# Patient Record
Sex: Female | Born: 1995 | Race: White | Hispanic: No | Marital: Single | Smoking: Former smoker
Health system: Southern US, Community
[De-identification: ages and names within clinical notes are randomized; demographics above are authoritative.]

## PROBLEM LIST (undated history)

## (undated) DIAGNOSIS — F3481 Disruptive mood dysregulation disorder: Secondary | ICD-10-CM

## (undated) DIAGNOSIS — R569 Unspecified convulsions: Secondary | ICD-10-CM

## (undated) DIAGNOSIS — K5900 Constipation, unspecified: Secondary | ICD-10-CM

## (undated) DIAGNOSIS — F603 Borderline personality disorder: Secondary | ICD-10-CM

## (undated) DIAGNOSIS — Z8719 Personal history of other diseases of the digestive system: Secondary | ICD-10-CM

## (undated) DIAGNOSIS — R45851 Suicidal ideations: Secondary | ICD-10-CM

## (undated) DIAGNOSIS — F329 Major depressive disorder, single episode, unspecified: Secondary | ICD-10-CM

## (undated) DIAGNOSIS — Z889 Allergy status to unspecified drugs, medicaments and biological substances status: Secondary | ICD-10-CM

## (undated) DIAGNOSIS — F4310 Post-traumatic stress disorder, unspecified: Secondary | ICD-10-CM

## (undated) DIAGNOSIS — F32A Depression, unspecified: Secondary | ICD-10-CM

## (undated) DIAGNOSIS — Z8669 Personal history of other diseases of the nervous system and sense organs: Secondary | ICD-10-CM

## (undated) DIAGNOSIS — F79 Unspecified intellectual disabilities: Secondary | ICD-10-CM

---
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
## 2017-02-01 ENCOUNTER — Encounter (HOSPITAL_COMMUNITY): Payer: Self-pay | Admitting: Family Medicine

## 2017-02-01 ENCOUNTER — Emergency Department (HOSPITAL_COMMUNITY)
Admission: EM | Admit: 2017-02-01 | Discharge: 2017-02-01 | Disposition: A | Discharge status: Home / Self Care | Payer: Medicaid Other | Attending: Emergency Medicine | Admitting: Emergency Medicine

## 2017-02-01 ENCOUNTER — Emergency Department (HOSPITAL_COMMUNITY): Payer: Medicaid Other

## 2017-02-01 DIAGNOSIS — R9089 Other abnormal findings on diagnostic imaging of central nervous system: Secondary | ICD-10-CM

## 2017-02-01 DIAGNOSIS — S060X0A Concussion without loss of consciousness, initial encounter: Secondary | ICD-10-CM | POA: Diagnosis not present

## 2017-02-01 DIAGNOSIS — Y999 Unspecified external cause status: Secondary | ICD-10-CM | POA: Diagnosis not present

## 2017-02-01 DIAGNOSIS — Y929 Unspecified place or not applicable: Secondary | ICD-10-CM | POA: Diagnosis not present

## 2017-02-01 DIAGNOSIS — S0990XA Unspecified injury of head, initial encounter: Secondary | ICD-10-CM | POA: Diagnosis present

## 2017-02-01 DIAGNOSIS — Y939 Activity, unspecified: Secondary | ICD-10-CM | POA: Insufficient documentation

## 2017-02-01 DIAGNOSIS — Z87891 Personal history of nicotine dependence: Secondary | ICD-10-CM | POA: Diagnosis not present

## 2017-02-01 DIAGNOSIS — Z79899 Other long term (current) drug therapy: Secondary | ICD-10-CM | POA: Insufficient documentation

## 2017-02-01 LAB — POC URINE PREG, ED: Preg Test, Ur: NEGATIVE
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
## 2017-02-01 NOTE — ED Notes (Signed)
Patient given sandwich, sprite, and cheese stick.

## 2017-02-01 NOTE — ED Notes (Signed)
Patient transported to CT 
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
## 2017-02-01 NOTE — ED Notes (Signed)
Spoke with Bard Herbertaphne, patient's group home director, who reports she will send transportation for patient.
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
## 2017-02-13 ENCOUNTER — Encounter (HOSPITAL_COMMUNITY): Payer: Self-pay | Admitting: Behavioral Health

## 2017-02-13 ENCOUNTER — Emergency Department (HOSPITAL_COMMUNITY)
Admission: EM | Admit: 2017-02-13 | Discharge: 2017-02-14 | Disposition: A | Discharge status: Home / Self Care | Payer: No Typology Code available for payment source | Attending: Emergency Medicine | Admitting: Emergency Medicine

## 2017-02-13 DIAGNOSIS — R456 Violent behavior: Secondary | ICD-10-CM | POA: Diagnosis not present

## 2017-02-13 DIAGNOSIS — R51 Headache: Secondary | ICD-10-CM | POA: Diagnosis not present

## 2017-02-13 DIAGNOSIS — F3481 Disruptive mood dysregulation disorder: Secondary | ICD-10-CM | POA: Diagnosis present

## 2017-02-13 DIAGNOSIS — F603 Borderline personality disorder: Secondary | ICD-10-CM | POA: Insufficient documentation

## 2017-02-13 DIAGNOSIS — Z79899 Other long term (current) drug therapy: Secondary | ICD-10-CM | POA: Insufficient documentation

## 2017-02-13 DIAGNOSIS — J3489 Other specified disorders of nose and nasal sinuses: Secondary | ICD-10-CM | POA: Diagnosis not present

## 2017-02-13 DIAGNOSIS — F4310 Post-traumatic stress disorder, unspecified: Secondary | ICD-10-CM | POA: Diagnosis not present

## 2017-02-13 DIAGNOSIS — R4689 Other symptoms and signs involving appearance and behavior: Secondary | ICD-10-CM | POA: Insufficient documentation

## 2017-02-13 DIAGNOSIS — R451 Restlessness and agitation: Secondary | ICD-10-CM | POA: Diagnosis not present

## 2017-02-13 DIAGNOSIS — F78 Other intellectual disabilities: Secondary | ICD-10-CM | POA: Insufficient documentation

## 2017-02-13 DIAGNOSIS — F329 Major depressive disorder, single episode, unspecified: Secondary | ICD-10-CM | POA: Insufficient documentation

## 2017-02-13 DIAGNOSIS — Z046 Encounter for general psychiatric examination, requested by authority: Secondary | ICD-10-CM | POA: Diagnosis present

## 2017-02-13 LAB — CBC WITH DIFFERENTIAL/PLATELET
Basophils Absolute: 0 10*3/uL (ref 0.0–0.1)
Basophils Relative: 0 %
EOS ABS: 0 10*3/uL (ref 0.0–0.7)
Eosinophils Relative: 0 %
HEMATOCRIT: 39 % (ref 36.0–46.0)
HEMOGLOBIN: 13 g/dL (ref 12.0–15.0)
LYMPHS ABS: 1.7 10*3/uL (ref 0.7–4.0)
LYMPHS PCT: 18 %
MCH: 29.3 pg (ref 26.0–34.0)
MCHC: 33.3 g/dL (ref 30.0–36.0)
MCV: 87.8 fL (ref 78.0–100.0)
MONOS PCT: 4 %
Monocytes Absolute: 0.4 10*3/uL (ref 0.1–1.0)
NEUTROS ABS: 7.4 10*3/uL (ref 1.7–7.7)
Neutrophils Relative %: 78 %
Platelets: 207 10*3/uL (ref 150–400)
RBC: 4.44 MIL/uL (ref 3.87–5.11)
RDW: 13 % (ref 11.5–15.5)
WBC: 9.4 10*3/uL (ref 4.0–10.5)

## 2017-02-13 LAB — COMPREHENSIVE METABOLIC PANEL
ALT: 13 U/L — AB (ref 14–54)
AST: 15 U/L (ref 15–41)
Albumin: 3.6 g/dL (ref 3.5–5.0)
Alkaline Phosphatase: 58 U/L (ref 38–126)
Anion gap: 5 (ref 5–15)
BILIRUBIN TOTAL: 0.3 mg/dL (ref 0.3–1.2)
BUN: 13 mg/dL (ref 6–20)
CHLORIDE: 112 mmol/L — AB (ref 101–111)
CO2: 20 mmol/L — ABNORMAL LOW (ref 22–32)
CREATININE: 0.9 mg/dL (ref 0.44–1.00)
Calcium: 9.7 mg/dL (ref 8.9–10.3)
Glucose, Bld: 109 mg/dL — ABNORMAL HIGH (ref 65–99)
Potassium: 3.6 mmol/L (ref 3.5–5.1)
Sodium: 137 mmol/L (ref 135–145)
TOTAL PROTEIN: 6.6 g/dL (ref 6.5–8.1)

## 2017-02-13 LAB — PREGNANCY, URINE: Preg Test, Ur: NEGATIVE

## 2017-02-13 LAB — RAPID URINE DRUG SCREEN, HOSP PERFORMED
AMPHETAMINES: NOT DETECTED
Barbiturates: NOT DETECTED
Benzodiazepines: NOT DETECTED
Cocaine: NOT DETECTED
OPIATES: NOT DETECTED
Tetrahydrocannabinol: NOT DETECTED

## 2017-02-13 LAB — ACETAMINOPHEN LEVEL: Acetaminophen (Tylenol), Serum: <10 ug/mL — ABNORMAL LOW (ref 10–30)

## 2017-02-13 LAB — ETHANOL

## 2017-02-13 LAB — SALICYLATE LEVEL: Salicylate Lvl: <7 mg/dL (ref 2.8–30.0)

## 2017-02-13 MED ORDER — OXCARBAZEPINE 150 MG PO TABS
75.0000 mg | ORAL_TABLET | Freq: Every day | ORAL | Status: DC
  Administered 2017-02-14: 75 mg via ORAL
  Filled 2017-02-13: qty 0.5

## 2017-02-13 MED ORDER — LITHIUM CARBONATE 300 MG PO CAPS
600.0000 mg | ORAL_CAPSULE | Freq: Two times a day (BID) | ORAL | Status: DC
  Administered 2017-02-14: 600 mg via ORAL
  Filled 2017-02-13: qty 2

## 2017-02-13 MED ORDER — SERTRALINE HCL 50 MG PO TABS
50.0000 mg | ORAL_TABLET | Freq: Every day | ORAL | Status: DC
  Administered 2017-02-13 – 2017-02-14 (×2): 50 mg via ORAL
  Filled 2017-02-13 (×2): qty 1

## 2017-02-13 MED ORDER — PANTOPRAZOLE SODIUM 40 MG PO TBEC
40.0000 mg | DELAYED_RELEASE_TABLET | Freq: Every day | ORAL | Status: DC
  Administered 2017-02-13 – 2017-02-14 (×2): 40 mg via ORAL
  Filled 2017-02-13 (×2): qty 1

## 2017-02-13 MED ORDER — VITAMIN D 1000 UNITS PO TABS
1000.0000 [IU] | ORAL_TABLET | Freq: Every day | ORAL | Status: DC
  Administered 2017-02-14: 1000 [IU] via ORAL
  Filled 2017-02-13: qty 1

## 2017-02-13 MED ORDER — LEVETIRACETAM 500 MG PO TABS
1500.0000 mg | ORAL_TABLET | Freq: Two times a day (BID) | ORAL | Status: DC
  Administered 2017-02-13 – 2017-02-14 (×2): 1500 mg via ORAL
  Filled 2017-02-13 (×2): qty 3

## 2017-02-13 MED ORDER — ACETAMINOPHEN 325 MG PO TABS: 650.0000 mg | ORAL_TABLET | ORAL | Status: DC | PRN

## 2017-02-13 MED ORDER — VITAMIN B-6 50 MG PO TABS
50.0000 mg | ORAL_TABLET | Freq: Every day | ORAL | Status: DC
  Administered 2017-02-14: 50 mg via ORAL
  Filled 2017-02-13: qty 1

## 2017-02-13 MED ORDER — GUANFACINE HCL 1 MG PO TABS
0.5000 mg | ORAL_TABLET | Freq: Two times a day (BID) | ORAL | Status: DC
  Administered 2017-02-14: 0.5 mg via ORAL
  Filled 2017-02-13 (×2): qty 1

## 2017-02-13 MED ORDER — LORATADINE 10 MG PO TABS
10.0000 mg | ORAL_TABLET | Freq: Every day | ORAL | Status: DC
  Administered 2017-02-14: 10 mg via ORAL
  Filled 2017-02-13: qty 1

## 2017-02-13 MED ORDER — DOCUSATE SODIUM 100 MG PO CAPS
100.0000 mg | ORAL_CAPSULE | Freq: Two times a day (BID) | ORAL | Status: DC
  Administered 2017-02-13 – 2017-02-14 (×2): 100 mg via ORAL
  Filled 2017-02-13 (×2): qty 1

## 2017-02-13 MED ORDER — TRAZODONE HCL 50 MG PO TABS
150.0000 mg | ORAL_TABLET | Freq: Every evening | ORAL | Status: DC | PRN
  Administered 2017-02-13: 23:00:00 150 mg via ORAL
  Filled 2017-02-13: qty 1

## 2017-02-13 NOTE — ED Notes (Signed)
Bed: WA29 Expected date:  Expected time:  Means of arrival:  Comments: [facility]
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
## 2017-02-13 NOTE — ED Notes (Signed)
Pt A&O x 3, no distress noted, calm & cooperative, watching TV at present.  Monitoring for safety, Q 15 min checks in effect.  Pt assessed by TTS.
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
## 2017-02-13 NOTE — ED Notes (Signed)
Pt oriented to room and unit.  Pt is somewhat irritable but cooperative.  She doesn't want to be here but understands that she is under IVC.  Pt denies S/I, H/I, and AVH.  Pt was given Dinner and extra blanket and instructed to let us know of her needs.  15 minute checks and video monitoring in place.
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
## 2017-02-14 DIAGNOSIS — F3481 Disruptive mood dysregulation disorder: Secondary | ICD-10-CM

## 2017-02-14 LAB — LITHIUM LEVEL: Lithium Lvl: 0.82 mmol/L (ref 0.60–1.20)

## 2017-02-14 NOTE — ED Notes (Addendum)
Pt d/c home per MD order. Discharge summary reviewed with pt. Pt verbalizes understanding. Denies SI/HI/AVH. Pt signed e-signature. Personal property returned. Pt ambulatory off unit. Group home member in lobby for d/c..Marland Kitchen

## 2017-02-14 NOTE — Consult Note (Signed)
[facility] Consult   Reason for Consult: Agitation  Referring Physician:  EDP Patient Identification: Sherry Bryan MRN:  [id number] Principal Diagnosis: DMDD (disruptive mood dysregulation disorder) ([place]) Diagnosis:   Patient Active Problem List   Diagnosis Date Noted  . DMDD (disruptive mood dysregulation disorder) ([place]) [F34.81] 05/15/2015    Priority: High  . Abnormal CT of brain [R90.89] 02/01/2017  . Borderline personality disorder ([place]) [F60.3]   . Lithium toxicity [T56.891A] 12/19/2016  . Suicidal ideation [R45.851] 12/19/2016  . Homicidal ideations [R45.850]   . Suicidal ideations [R45.851]   . MDD (major depressive disorder), recurrent severe, without psychosis ([place]) [F33.2] 09/23/2015  . Adjustment disorder with mixed disturbance of emotions and conduct [F43.25] 09/23/2015  . Intellectual disability [F79] 05/22/2015  . MDD (major depressive disorder), recurrent, severe, with psychosis ([place]) [F33.3] 05/15/2015  . Hx of seizure disorder [Z86.69] 05/15/2015  . Constipation [K59.00] 05/15/2015  . Hx of gastroesophageal reflux (GERD) [Z87.19] 05/15/2015  . History of seasonal allergies [Z88.9] 05/15/2015    Total Time spent with patient: 45 minutes  Subjective:   Sherry Bryan is a 22 y.o. female patient admitted with agitation.  HPI:  Pt was seen and chart reviewed with treatment team and Dr Mariea Clonts. Pt stated she was at her group home and got upset because she could not afford some shoes she wanted and no one would give her the extra 10 dollars. When she got upset they IVC'd her and brought her to the hospital.  Pt denies suicidal/homicidal ideation, denies auditory/visual hallucinations and does not appear to be responding to internal stimuli. Pt's UDS and BAL are negative, Lithium level is 0.82, therapeutic. Pt is psychiatrically clear for discharge back to her group home.   Past Psychiatric History: As above  Risk to Self: None Risk to Others:  None Prior Inpatient Therapy: Prior Inpatient Therapy: Yes Prior Therapy Dates: (multiple) Prior Therapy Facilty/Provider(s): (Last hospitalized at [facility] last year) Reason for Treatment: (depression) Prior Outpatient Therapy: Prior Outpatient Therapy: Yes Prior Therapy Dates: (current) Prior Therapy Facilty/Provider(s): Consulting civil engineer) Reason for Treatment: (depression) Does patient have an ACCT team?: No Does patient have Intensive In-House Services?  : No Does patient have [facility] services? : No Does patient have P4CC services?: No  Past Medical History:  Past Medical History:  Diagnosis Date  . Borderline personality disorder ([place])   . Constipation 05/15/2015  . Depression   . DMDD (disruptive mood dysregulation disorder) ([place]) 05/15/2015  . History of seasonal allergies 05/15/2015  . Hx of gastroesophageal reflux (GERD) 05/15/2015  . Hx of seizure disorder 05/15/2015  . Intellectual disability 05/22/2015  . PTSD (post-traumatic stress disorder)   . Seizures ([place])    last one in 2015  . Suicidal ideation    History reviewed. No pertinent surgical history. Family History: History reviewed. No pertinent family history. Family Psychiatric  History: Unknown Social History:  Social History   Substance and Sexual Activity  Alcohol Use No     Social History   Substance and Sexual Activity  Drug Use No    Social History   Socioeconomic History  . Marital status: Single    Spouse name: None  . Number of children: None  . Years of education: None  . Highest education level: None  Social Needs  . Financial resource strain: None  . Food insecurity - worry: None  . Food insecurity - inability: None  . Transportation needs - medical: None  . Transportation needs - non-medical: None  Occupational History  .  None  Tobacco Use  . Smoking status: Former Research scientist (life sciences)  . Smokeless tobacco: Never Used  Substance and Sexual Activity  . Alcohol use: No  . Drug use: No  . Sexual activity: No   Other Topics Concern  . None  Social History Narrative  . None   Additional Social History: N/A    Allergies:   Allergies  Allergen Reactions  . Ritalin [Methylphenidate Hcl] Other (See Comments)    seizures  . Abilify [Aripiprazole] Other (See Comments)    Shaking or tremors    Labs:  Results for orders placed or performed during the hospital encounter of 02/13/17 (from the past 48 hour(s))  Comprehensive metabolic panel     Status: Abnormal   Collection Time: 02/13/17  5:13 PM  Result Value Ref Range   Sodium 137 135 - 145 mmol/L   Potassium 3.6 3.5 - 5.1 mmol/L   Chloride 112 (H) 101 - 111 mmol/L   CO2 20 (L) 22 - 32 mmol/L   Glucose, Bld 109 (H) 65 - 99 mg/dL   BUN 13 6 - 20 mg/dL   Creatinine, Ser 0.90 0.44 - 1.00 mg/dL   Calcium 9.7 8.9 - 10.3 mg/dL   Total Protein 6.6 6.5 - 8.1 g/dL   Albumin 3.6 3.5 - 5.0 g/dL   AST 15 15 - 41 U/L   ALT 13 (L) 14 - 54 U/L   Alkaline Phosphatase 58 38 - 126 U/L   Total Bilirubin 0.3 0.3 - 1.2 mg/dL   GFR calc non Af Amer >60 >60 mL/min   GFR calc Af Amer >60 >60 mL/min    Comment: (NOTE) The eGFR has been calculated using the CKD EPI equation. This calculation has not been validated in all clinical situations. eGFR's persistently <60 mL/min signify possible Chronic Kidney Disease.    Anion gap 5 5 - 15    Comment: Performed at [facility], [place] [address], [place], [place] [address]  Ethanol     Status: None   Collection Time: 02/13/17  5:13 PM  Result Value Ref Range   Alcohol, Ethyl (B) <10 <10 mg/dL    Comment:        LOWEST DETECTABLE LIMIT FOR SERUM ALCOHOL IS 10 mg/dL FOR MEDICAL PURPOSES ONLY Performed at [place] [address], [place], [place] [address]   CBC with Diff     Status: None   Collection Time: 02/13/17  5:13 PM  Result Value Ref Range   WBC 9.4 4.0 - 10.5 K/uL   RBC 4.44 3.87 - 5.11 MIL/uL   Hemoglobin 13.0 12.0 - 15.0 g/dL   HCT 39.0 36.0 - 46.0 %    MCV 87.8 78.0 - 100.0 fL   MCH 29.3 26.0 - 34.0 pg   MCHC 33.3 30.0 - 36.0 g/dL   RDW 13.0 11.5 - 15.5 %   Platelets 207 150 - 400 K/uL   Neutrophils Relative % 78 %   Neutro Abs 7.4 1.7 - 7.7 K/uL   Lymphocytes Relative 18 %   Lymphs Abs 1.7 0.7 - 4.0 K/uL   Monocytes Relative 4 %   Monocytes Absolute 0.4 0.1 - 1.0 K/uL   Eosinophils Relative 0 %   Eosinophils Absolute 0.0 0.0 - 0.7 K/uL   Basophils Relative 0 %   Basophils Absolute 0.0 0.0 - 0.1 K/uL    Comment: Performed at [facility], [place] [address] [place], [place] [address]  Salicylate level  Status: None   Collection Time: 02/13/17  5:13 PM  Result Value Ref Range   Salicylate Lvl <3.8 2.8 - 30.0 mg/dL    Comment: Performed at [facility], [place] [address] [place], [place] [address]  Acetaminophen level     Status: Abnormal   Collection Time: 02/13/17  5:13 PM  Result Value Ref Range   Acetaminophen (Tylenol), Serum <10 (L) 10 - 30 ug/mL    Comment:        THERAPEUTIC CONCENTRATIONS VARY SIGNIFICANTLY. A RANGE OF 10-30 ug/mL MAY BE AN EFFECTIVE CONCENTRATION FOR MANY PATIENTS. HOWEVER, SOME ARE BEST TREATED AT CONCENTRATIONS OUTSIDE THIS RANGE. ACETAMINOPHEN CONCENTRATIONS >150 ug/mL AT 4 HOURS AFTER INGESTION AND >50 ug/mL AT 12 HOURS AFTER INGESTION ARE OFTEN ASSOCIATED WITH TOXIC REACTIONS. Performed at [facility], [place] [address] [place], [place] [address]   Urine rapid drug screen (hosp performed)     Status: None   Collection Time: 02/13/17  5:24 PM  Result Value Ref Range   Opiates NONE DETECTED NONE DETECTED   Cocaine NONE DETECTED NONE DETECTED   Benzodiazepines NONE DETECTED NONE DETECTED   Amphetamines NONE DETECTED NONE DETECTED   Tetrahydrocannabinol NONE DETECTED NONE DETECTED   Barbiturates NONE DETECTED NONE DETECTED    Comment: (NOTE) DRUG SCREEN FOR MEDICAL PURPOSES ONLY.  IF CONFIRMATION IS NEEDED FOR ANY PURPOSE, NOTIFY  LAB WITHIN 5 DAYS. LOWEST DETECTABLE LIMITS FOR URINE DRUG SCREEN Drug Class                     Cutoff (ng/mL) Amphetamine and metabolites    1000 Barbiturate and metabolites    200 Benzodiazepine                 527 Tricyclics and metabolites     300 Opiates and metabolites        300 Cocaine and metabolites        300 THC                            50 Performed at [facility], [place] [address], [place], [place] [address]   Pregnancy, urine     Status: None   Collection Time: 02/13/17  5:24 PM  Result Value Ref Range   Preg Test, Ur NEGATIVE NEGATIVE    Comment:        THE SENSITIVITY OF THIS METHODOLOGY IS >20 mIU/mL. Performed at [facility], [place] [address], [place], [place] [address]   Lithium level     Status: None   Collection Time: 02/14/17  9:55 AM  Result Value Ref Range   Lithium Lvl 0.82 0.60 - 1.20 mmol/L    Comment: Performed at [facility], [place] [address], [place],  [address]    Current Facility-Administered Medications  Medication Dose Route Frequency Provider Last Rate Last Dose  . acetaminophen (TYLENOL) tablet 650 mg  650 mg Oral Q4H PRN Jacqlyn Larsen, PA-C      . cholecalciferol (VITAMIN D) tablet 1,000 Units  1,000 Units Oral Daily Jacqlyn Larsen, PA-C   1,000 Units at 02/14/17 1017  . docusate sodium (COLACE) capsule 100 mg  100 mg Oral BID Benedetto Goad N, PA-C   100 mg at 02/14/17 1016  . guanFACINE (TENEX) tablet 0.5 mg  0.5 mg Oral BID Benedetto Goad N, PA-C   0.5 mg at 02/14/17 1017  . levETIRAcetam (KEPPRA)  tablet 1,500 mg  1,500 mg Oral BID Benedetto Goad N, PA-C   1,500 mg at 02/14/17 1016  . lithium carbonate capsule 600 mg  600 mg Oral BID WC Benedetto Goad N, PA-C   600 mg at 02/14/17 0840  . loratadine (CLARITIN) tablet 10 mg  10 mg Oral Daily Benedetto Goad N, PA-C   10 mg at 02/14/17 1016  . OXcarbazepine (TRILEPTAL) tablet 75 mg  75 mg Oral Daily Jacqlyn Larsen, PA-C   75 mg at  02/14/17 1017  . pantoprazole (PROTONIX) EC tablet 40 mg  40 mg Oral Daily Benedetto Goad N, PA-C   40 mg at 02/14/17 1016  . pyridOXINE (VITAMIN B-6) tablet 50 mg  50 mg Oral Daily Benedetto Goad N, PA-C   50 mg at 02/14/17 1016  . sertraline (ZOLOFT) tablet 50 mg  50 mg Oral Daily Benedetto Goad N, PA-C   50 mg at 02/14/17 1016  . traZODone (DESYREL) tablet 150 mg  150 mg Oral QHS PRN,MR X 1 Laverle Hobby, PA-C   150 mg at 02/13/17 2242   Current Outpatient Medications  Medication Sig Dispense Refill  . cholecalciferol (VITAMIN D) 1000 units tablet Take 1,000 Units by mouth daily.    Marland Kitchen docusate sodium (COLACE) 100 MG capsule Take 100 mg by mouth 2 (two) times daily.     Marland Kitchen guaiFENesin (MUCINEX) 600 MG 12 hr tablet Take 600 mg by mouth 2 (two) times daily.    Marland Kitchen guanFACINE (TENEX) 1 MG tablet Take 0.5 mg by mouth 2 (two) times daily.     Marland Kitchen levETIRAcetam (KEPPRA) 750 MG tablet Take 1,500 mg by mouth 2 (two) times daily.    Marland Kitchen lithium carbonate 300 MG capsule Take 600 mg by mouth 2 (two) times daily with a meal.    . loratadine (CLARITIN) 10 MG tablet Take 10 mg by mouth daily.    Marland Kitchen omeprazole (PRILOSEC) 20 MG capsule Take 40 mg by mouth daily.     . OXcarbazepine (TRILEPTAL) 150 MG tablet Take 75 mg by mouth daily.    Marland Kitchen pyridOXINE (VITAMIN B-6) 50 MG tablet Take 50 mg by mouth daily.    . sertraline (ZOLOFT) 50 MG tablet Take 50 mg by mouth daily.     Marland Kitchen adapalene (DIFFERIN) 0.1 % cream Apply 1 application topically at bedtime. Has not started yet.    . Ascorbic Acid (VITAMIN C PO) Take 1 tablet by mouth daily.    . Cyanocobalamin (VITAMIN B 12 PO) Take 1 tablet by mouth daily.    . fluticasone (FLONASE) 50 MCG/ACT nasal spray Place 2 sprays into the nose daily as needed for allergies.    Marland Kitchen lithium 600 MG capsule Take 600 mg by mouth 2 (two) times daily with a meal.    . meclizine (ANTIVERT) 25 MG tablet Take 25 mg by mouth 3 (three) times daily as needed for nausea.    . medroxyPROGESTERone  (DEPO-PROVERA) 150 MG/ML injection Inject 150 mg into the muscle every 3 (three) months.    . Multiple Vitamins-Minerals (MULTIVITAMIN ADULTS) TABS Take 1 tablet by mouth daily.    Marland Kitchen nystatin (MYCOSTATIN/NYSTOP) powder Apply 1 Bottle topically at bedtime as needed (RASH).    Marland Kitchen polyethylene glycol powder (GLYCOLAX/MIRALAX) powder Take 17 g by mouth daily as needed for constipation.    Marland Kitchen pyridOXINE (B-6) 50 MG tablet Take 50 mg by mouth daily.    Marland Kitchen topiramate (TOPAMAX) 25 MG tablet Take 25 mg by mouth 2 (two)  times daily.    . traZODone (DESYREL) 100 MG tablet Take 300 mg by mouth at bedtime.    . triamcinolone cream (KENALOG) 0.1 % Apply 1 application topically 2 (two) times daily.       Musculoskeletal: Strength & Muscle Tone: within normal limits Gait & Station: normal Patient leans: N/A  Psychiatric Specialty Exam: Physical Exam  Nursing note and vitals reviewed. Constitutional: She is oriented to person, place, and time. She appears well-developed and well-nourished.  Neck: Normal range of motion.  Respiratory: Effort normal.  Musculoskeletal: Normal range of motion.  Neurological: She is alert and oriented to person, place, and time.  Psychiatric: Her speech is normal and behavior is normal. Thought content normal. Cognition and memory are normal. She expresses impulsivity. She exhibits a depressed mood.    Review of Systems  Psychiatric/Behavioral: Positive for depression. Negative for hallucinations, memory loss, substance abuse and suicidal ideas. The patient is not nervous/anxious and does not have insomnia.   All other systems reviewed and are negative.   Blood pressure (!) 105/57, pulse 85, temperature 98.2 F (36.8 C), temperature source Oral, resp. rate 16, SpO2 99 %.There is no height or weight on file to calculate BMI.  General Appearance: Casual  Eye Contact:  Good  Speech:  Clear and Coherent and Normal Rate  Volume:  Normal  Mood:  Depressed  Affect:  Congruent  and Depressed  Thought Process:  Coherent, Goal Directed and Linear  Orientation:  Full (Time, Place, and Person)  Thought Content:  Logical  Suicidal Thoughts:  No  Homicidal Thoughts:  No  Memory:  Immediate;   Good Recent;   Good Remote;   Fair  Judgement:  Poor  Insight:  Lacking  Psychomotor Activity:  Normal  Concentration:  Concentration: Good and Attention Span: Good  Recall:  Good  Fund of Knowledge:  Good  Language:  Good  Akathisia:  No  Handed:  Right  AIMS (if indicated):    N/A  Assets:  Agricultural consultant Housing  ADL's:  Intact  Cognition:  WNL  Sleep:    N/A     Treatment Plan Summary: Plan DMDD (disruptive mood dysregulation disorder) ([place])  Discharge Home Take all medications as prescribed  Disposition: No evidence of imminent risk to self or others at present.   Patient does not meet criteria for psychiatric inpatient admission. Supportive therapy provided about ongoing stressors.  Ethelene Hal, NP 02/14/2017 12:08 PM   Patient seen face-to-face for psychiatric evaluation, chart reviewed and case discussed with the physician extender and developed treatment plan. Reviewed the information documented and agree with the treatment plan.  Buford Dresser, DO 02/14/17 10:45 PM

## 2017-02-14 NOTE — ED Notes (Signed)
phlebotomist called for ordered lithium level.

## 2017-02-14 NOTE — BHH Suicide Risk Assessment (Signed)
Suicide Risk Assessment  Discharge Assessment   [facility]   Principal Problem: DMDD (disruptive mood dysregulation disorder) [facility](HCC) Discharge Diagnoses:  Patient Active Problem List   Diagnosis Date Noted  . DMDD (disruptive mood dysregulation disorder) (HCC) [F34.81] 05/15/2015    Priority: High  . Abnormal CT of brain [R90.89] 02/01/2017  . Borderline personality disorder (HCC) [F60.3]   . Lithium toxicity [T56.891A] 12/19/2016  . Suicidal ideation [R45.851] 12/19/2016  . Homicidal ideations [R45.850]   . Suicidal ideations [R45.851]   . MDD (major depressive disorder), recurrent severe, without psychosis (HCC) [F33.2] 09/23/2015  . Adjustment disorder with mixed disturbance of emotions and conduct [F43.25] 09/23/2015  . Intellectual disability [F79] 05/22/2015  . MDD (major depressive disorder), recurrent, severe, with psychosis (HCC) [F33.3] 05/15/2015  . Hx of seizure disorder [Z86.69] 05/15/2015  . Constipation [K59.00] 05/15/2015  . Hx of gastroesophageal reflux (GERD) [Z87.19] 05/15/2015  . History of seasonal allergies [Z88.9] 05/15/2015    Total Time spent with patient: 45 minutes  Musculoskeletal: Strength & Muscle Tone: within normal limits Gait & Station: normal Patient leans: N/A   Psychiatric Specialty Exam: Physical Exam  Constitutional: She is oriented to person, place, and time. She appears well-developed and well-nourished.  Respiratory: Effort normal.  Musculoskeletal: Normal range of motion.  Neurological: She is alert and oriented to person, place, and time.  Psychiatric: Her speech is normal and behavior is normal. Thought content normal. Cognition and memory are normal. She expresses impulsivity. She exhibits a depressed mood.   Review of Systems  Psychiatric/Behavioral: Positive for depression. Negative for hallucinations, memory loss, substance abuse and suicidal ideas. The patient is not nervous/anxious and does not have  insomnia.   All other systems reviewed and are negative.  Blood pressure (!) 105/57, pulse 85, temperature 98.2 F (36.8 C), temperature source Oral, resp. rate 16, SpO2 99 %.There is no height or weight on file to calculate BMI. General Appearance: Casual Eye Contact:  Good Speech:  Clear and Coherent and Normal Rate Volume:  Normal Mood:  Depressed Affect:  Congruent and Depressed Thought Process:  Coherent, Goal Directed and Linear Orientation:  Full (Time, Place, and Person) Thought Content:  Logical Suicidal Thoughts:  No Homicidal Thoughts:  No Memory:  Immediate;   Good Recent;   Good Remote;   Fair Judgement:  Poor Insight:  Lacking Psychomotor Activity:  Normal Concentration:  Concentration: Good and Attention Span: Good Recall:  Good Fund of Knowledge:  Good Language:  Good Akathisia:  No Handed:  Right AIMS (if indicated):    Assets:  ArchitectCommunication Skills Financial Resources/Insurance Housing ADL's:  Intact Cognition:  WNL   Mental Status Per Nursing Assessment::   On Admission:   agitation  Demographic Factors:  Adolescent or young adult and Caucasian  Loss Factors: NA  Historical Factors: Impulsivity  Risk Reduction Factors:   Sense of responsibility to family and Living with another person, especially a relative  Continued Clinical Symptoms:  Depression:   Impulsivity Personality Disorders:   Cluster B Previous Psychiatric Diagnoses and Treatments  Cognitive Features That Contribute To Risk:  Closed-mindedness    Suicide Risk:  Minimal: No identifiable suicidal ideation.  Patients presenting with no risk factors but with morbid ruminations; may be classified as minimal risk based on the severity of the depressive symptoms    Plan Of Care/Follow-up recommendations:  Activity:  as tolerated Diet:  Heart healthy  Laveda AbbeLaurie Britton Parks, NP 02/14/2017, 12:10 PM

## 2017-02-14 NOTE — Progress Notes (Addendum)
11:03am- Ms. Rush informed that pt would be going back to group home today. No further questions. RN informed that pt will be being discharged today.   CSW received call from Tom (Dispostion) and was informed that pt is being cleared by psych and that pt would need to be picked up today. CSW spoke with [name] from pt's group home and confirmed that she would have someone to pick pt up around 11:30am-12pm. CSW reached out to Truddie [facility]Alexandra [facility]. CSW left Ms. Rush a VM at this time. There are no further CSW interventions needed at this time. CSW signing off.     Claude MangesKierra S. Miron Marxen, MSW, LCSW-A Emergency Department Clinical Social Worker [phone]

## 2017-02-14 NOTE — BH Assessment (Addendum)
[facility]  Per Juanetta BeetsJacqueline Norman, DO, this pt does not require psychiatric hospitalization at this time.  Pt presents under IVC initiated by staff at her group home, which Dr Sharma CovertNorman has rescinded.  Pt is to return to group home.  Norberta KeensKiarra, LCSW agrees to facilitate this.  At 11:00 Truddie CocoAlexandra Rush [phone]), pt's [facility] guardian, calls to inquire about disposition, and this Clinical research associatewriter informed her.  She asks to be called when pt is discharged, which I agree to do.  Pt's nurse, Morrie Sheldonshley, has been notified.  Doylene Canninghomas Jodeci Roarty, MA Triage Specialist [phone]   Addendum:  At 13:44 this writer called Ms Lamar Benesush.  Call rolled to voice mail, and I left a message notifying her [facility].  Doylene Canninghomas Muzammil Bruins, [place] Behavioral Health Coordinator [phone]

## 2019-11-14 IMAGING — CR DG HIP (WITH OR WITHOUT PELVIS) 2-3V*R*
3 series · 3 of 3 positions shown · non-contrast
Comparison: None.

CLINICAL DATA: Fall with right hip pain.  Initial encounter.

EXAM:
DG HIP (WITH OR WITHOUT PELVIS) 2-3V RIGHT

[t pelvis ap]
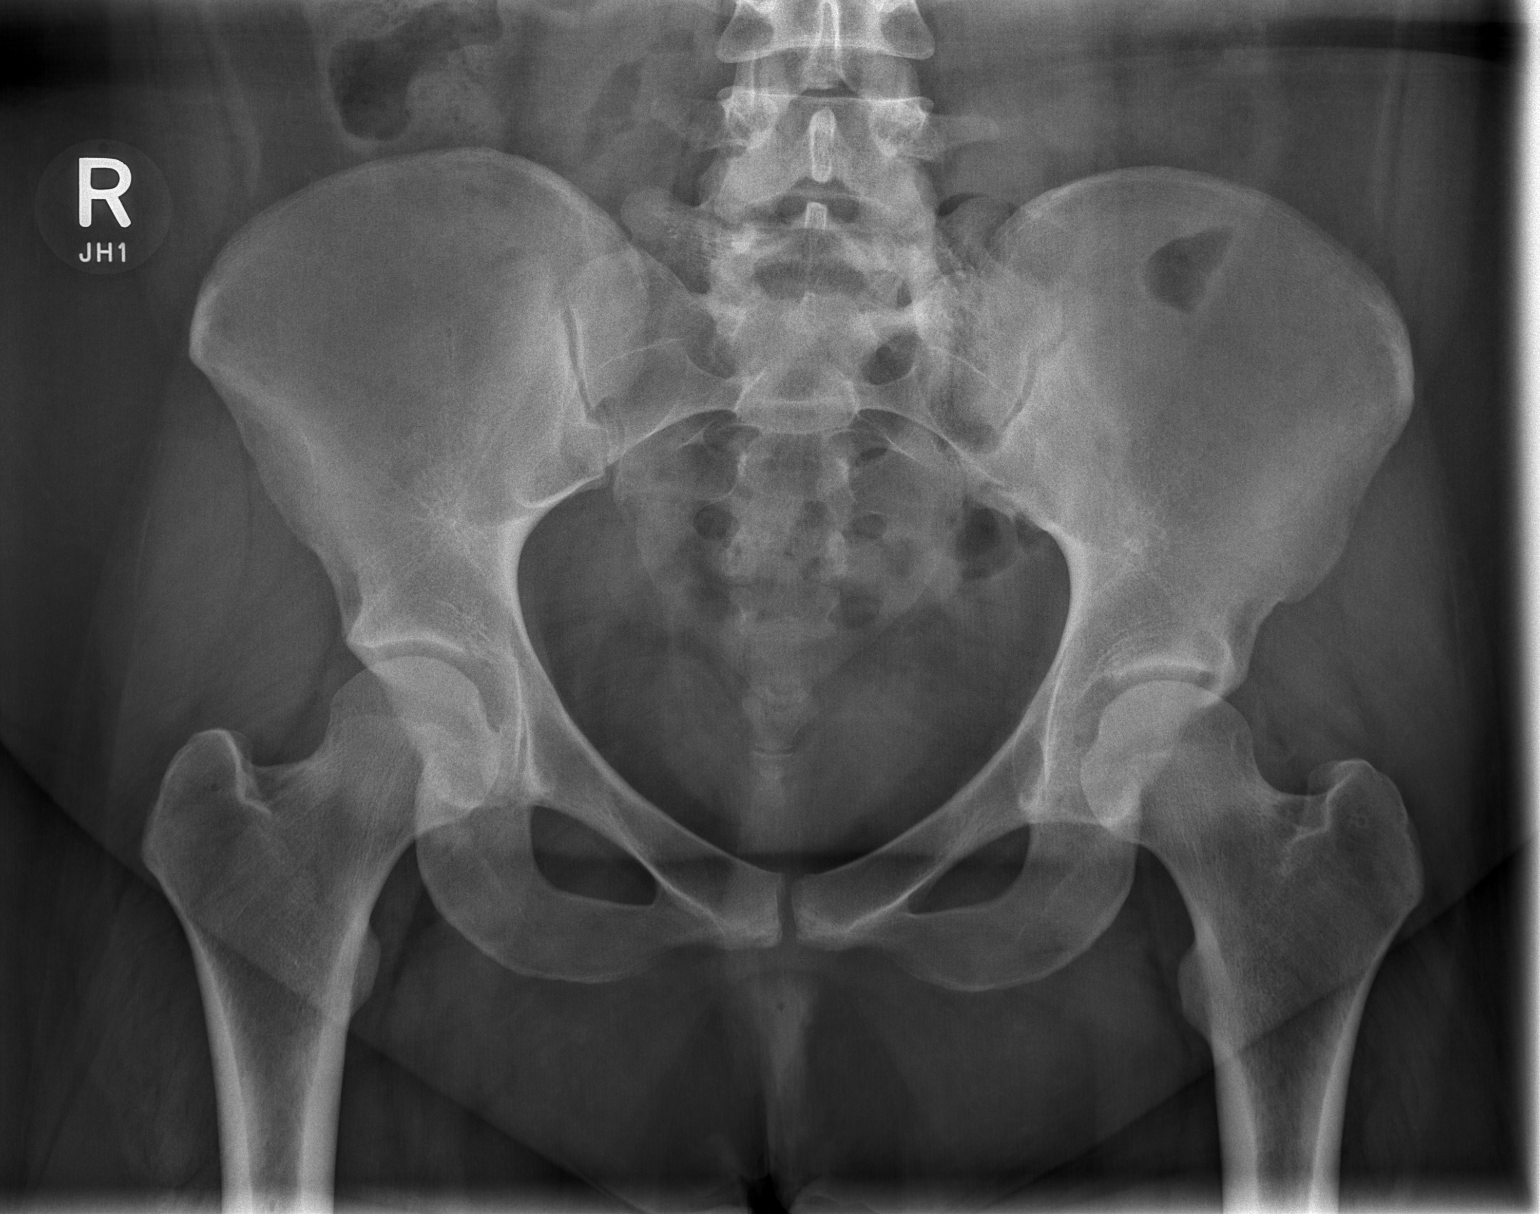

[t hip ap right]
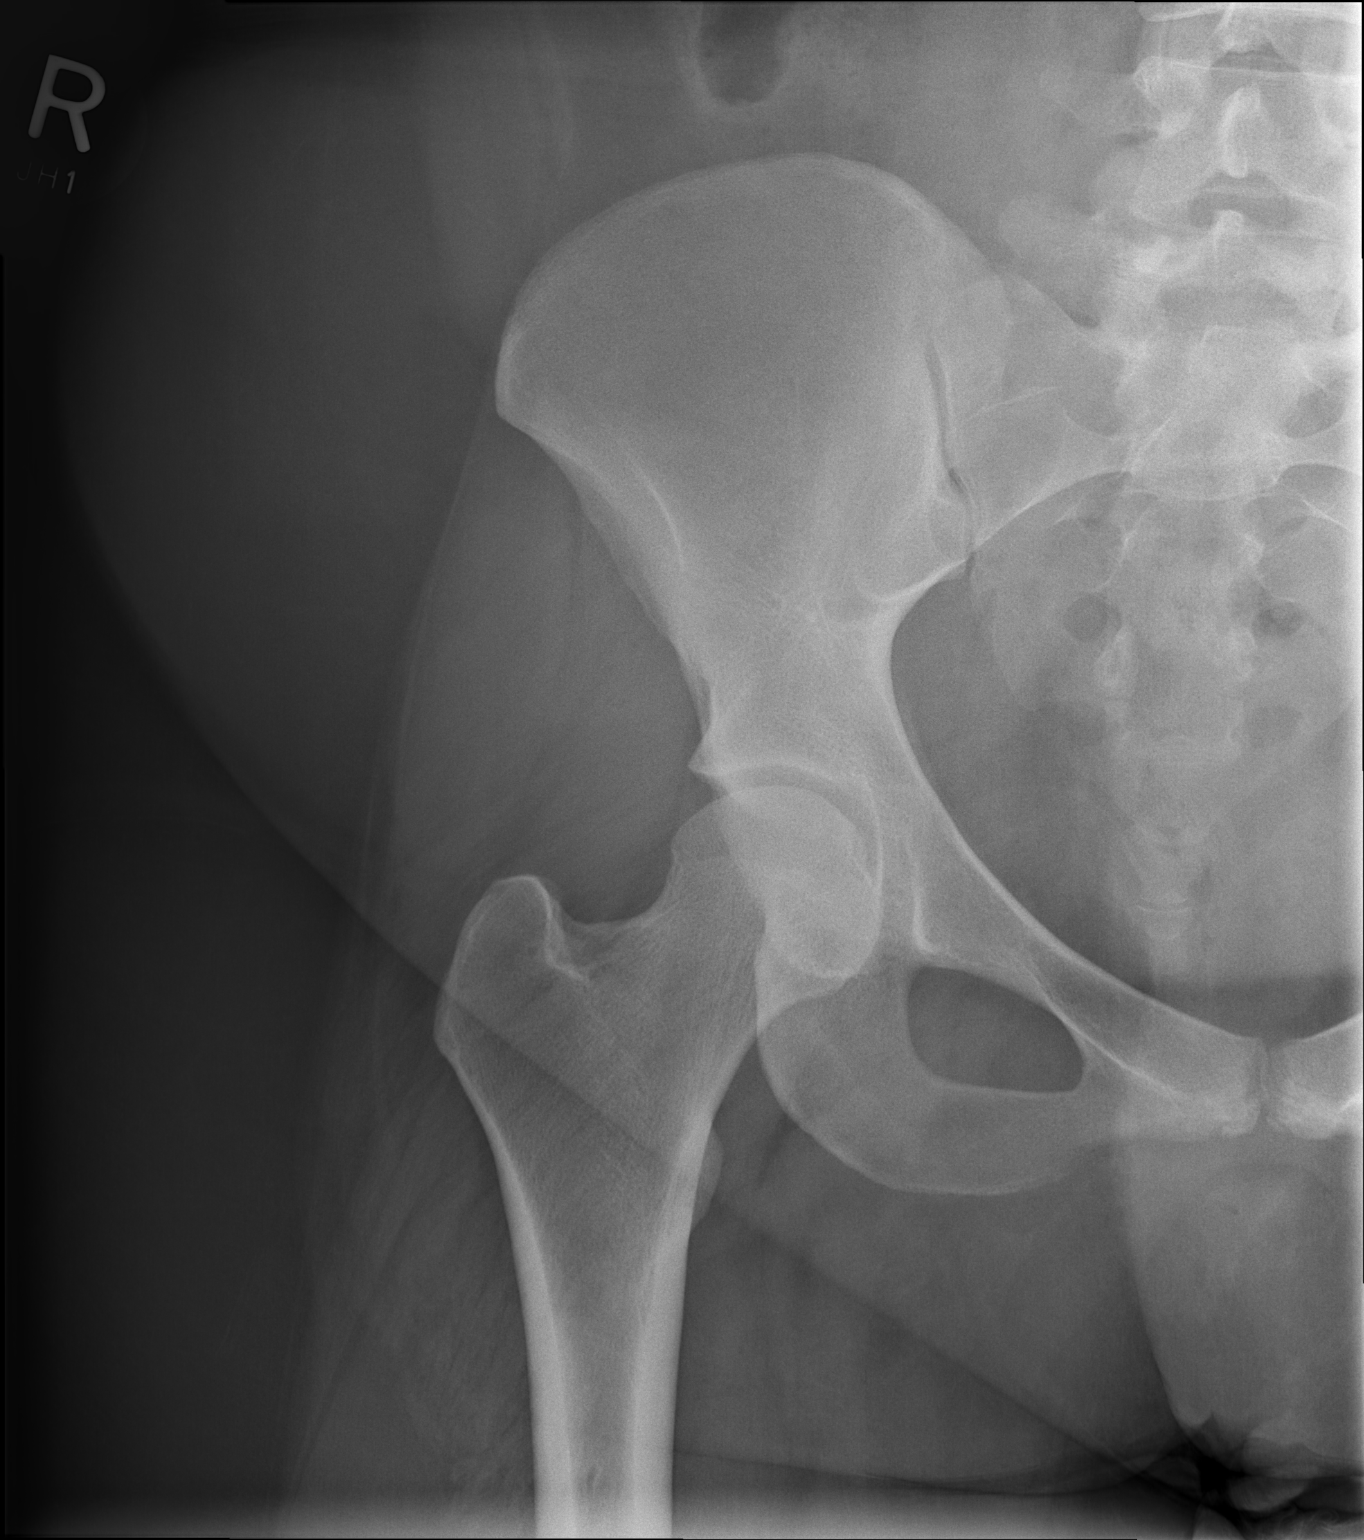

[t hip frog leg right]
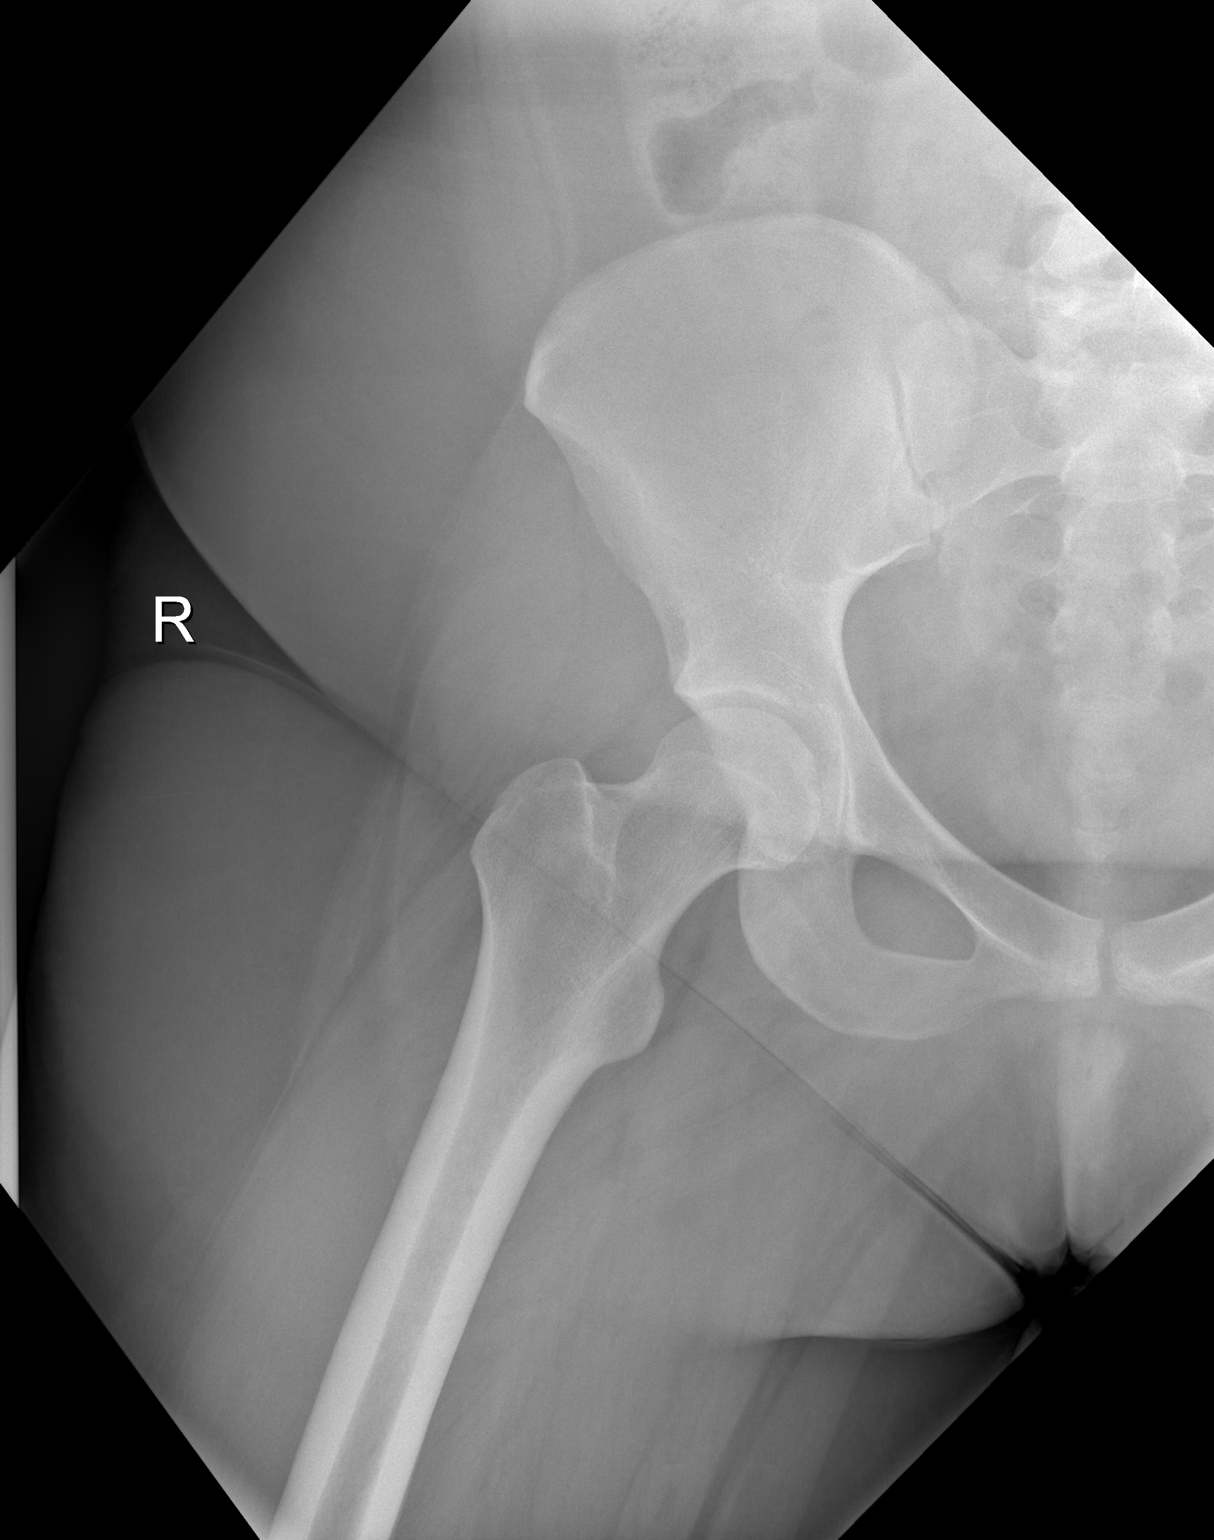

[3 of 3 positions shown; findings below may reference images not displayed]

FINDINGS: There is no evidence of hip fracture or dislocation. There is no
evidence of arthropathy or other focal bone abnormality. The bony
pelvis is intact without evidence of fracture or diastasis.
IMPRESSION: Negative.

## 2019-11-14 IMAGING — CR DG LUMBAR SPINE COMPLETE 4+V
5 series · 5 of 5 positions shown · non-contrast
Comparison: None

CLINICAL DATA: Fell today and snow and injured back.

EXAM:
LUMBAR SPINE - COMPLETE 4+ VIEW

[t lumbar spine ap]
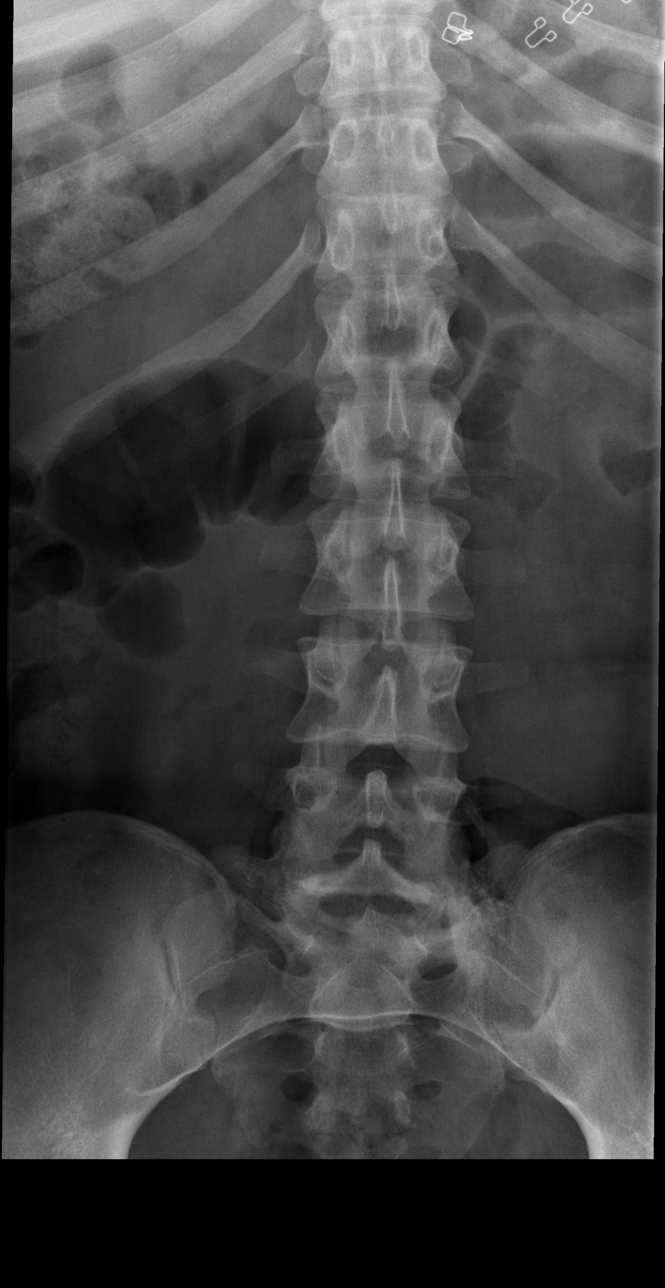

[t lumbar spine obl (1 of 2)]
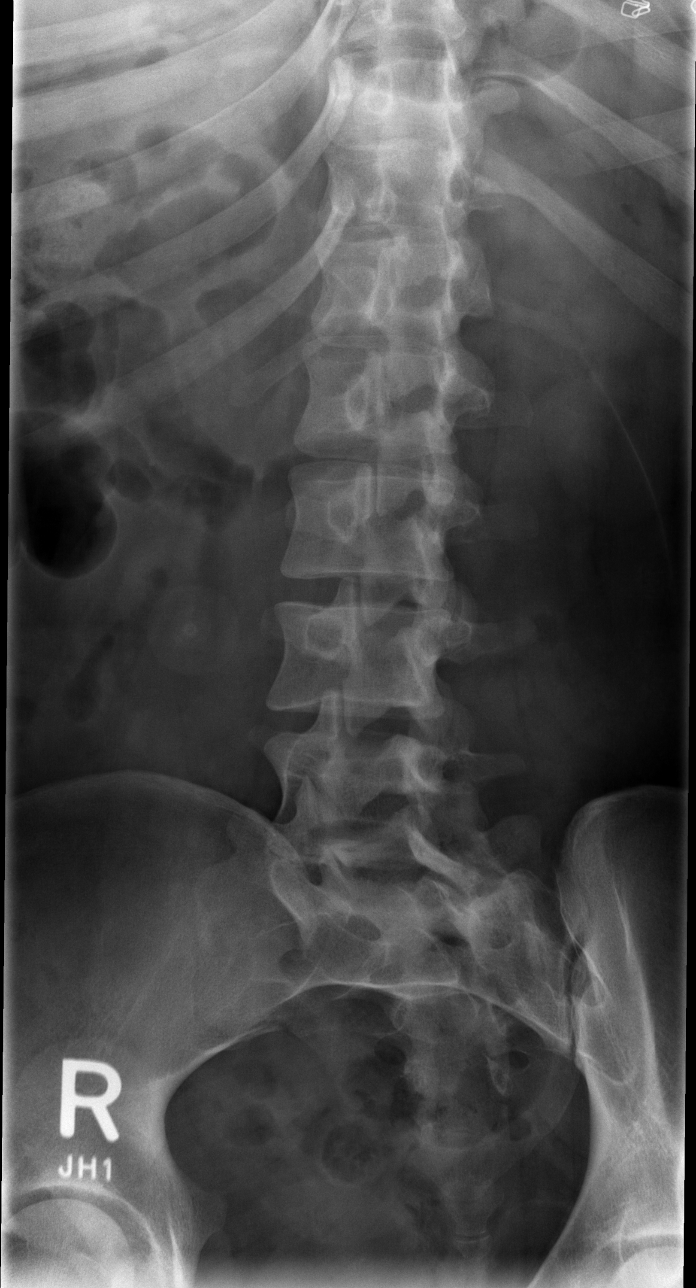

[t lumbar spine obl (2 of 2)]
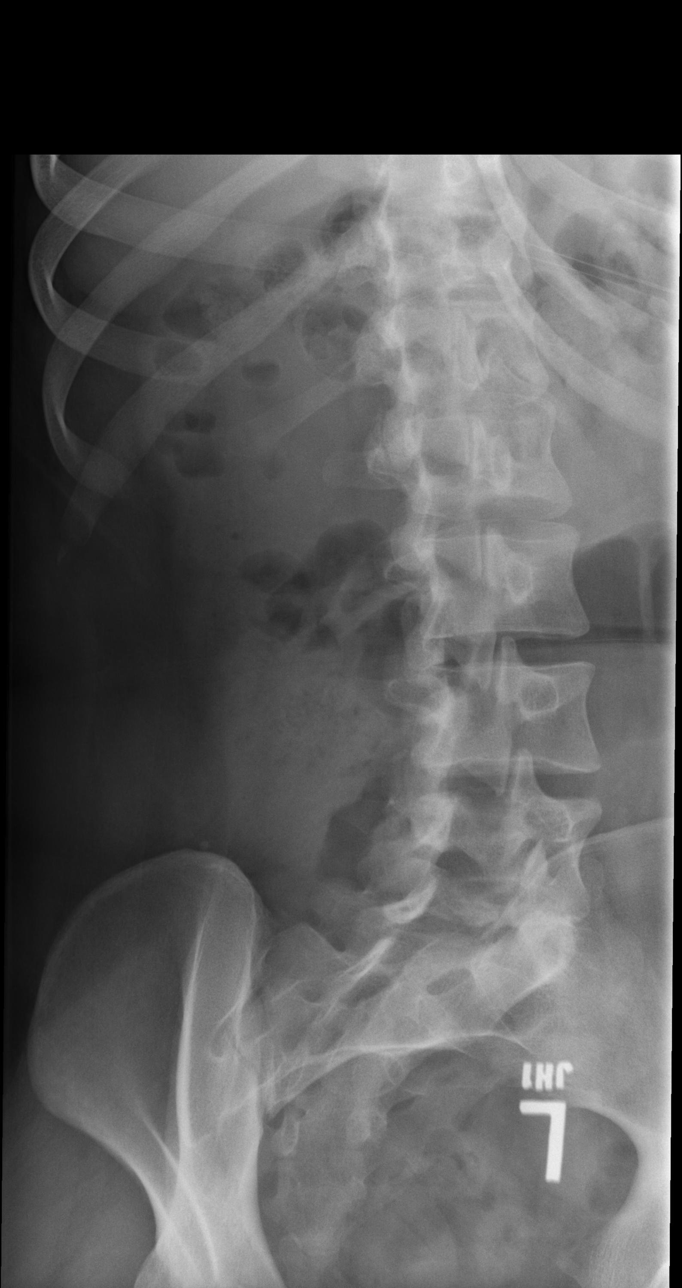

[t lumbar spine lat]
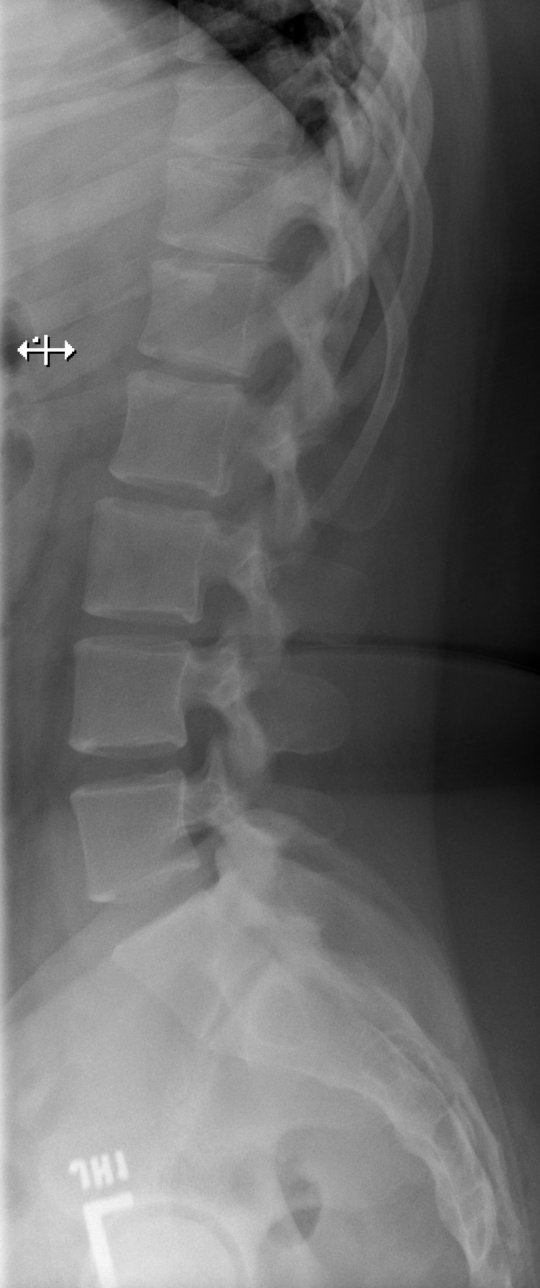

[t lumbar l-5 s-1 spot]
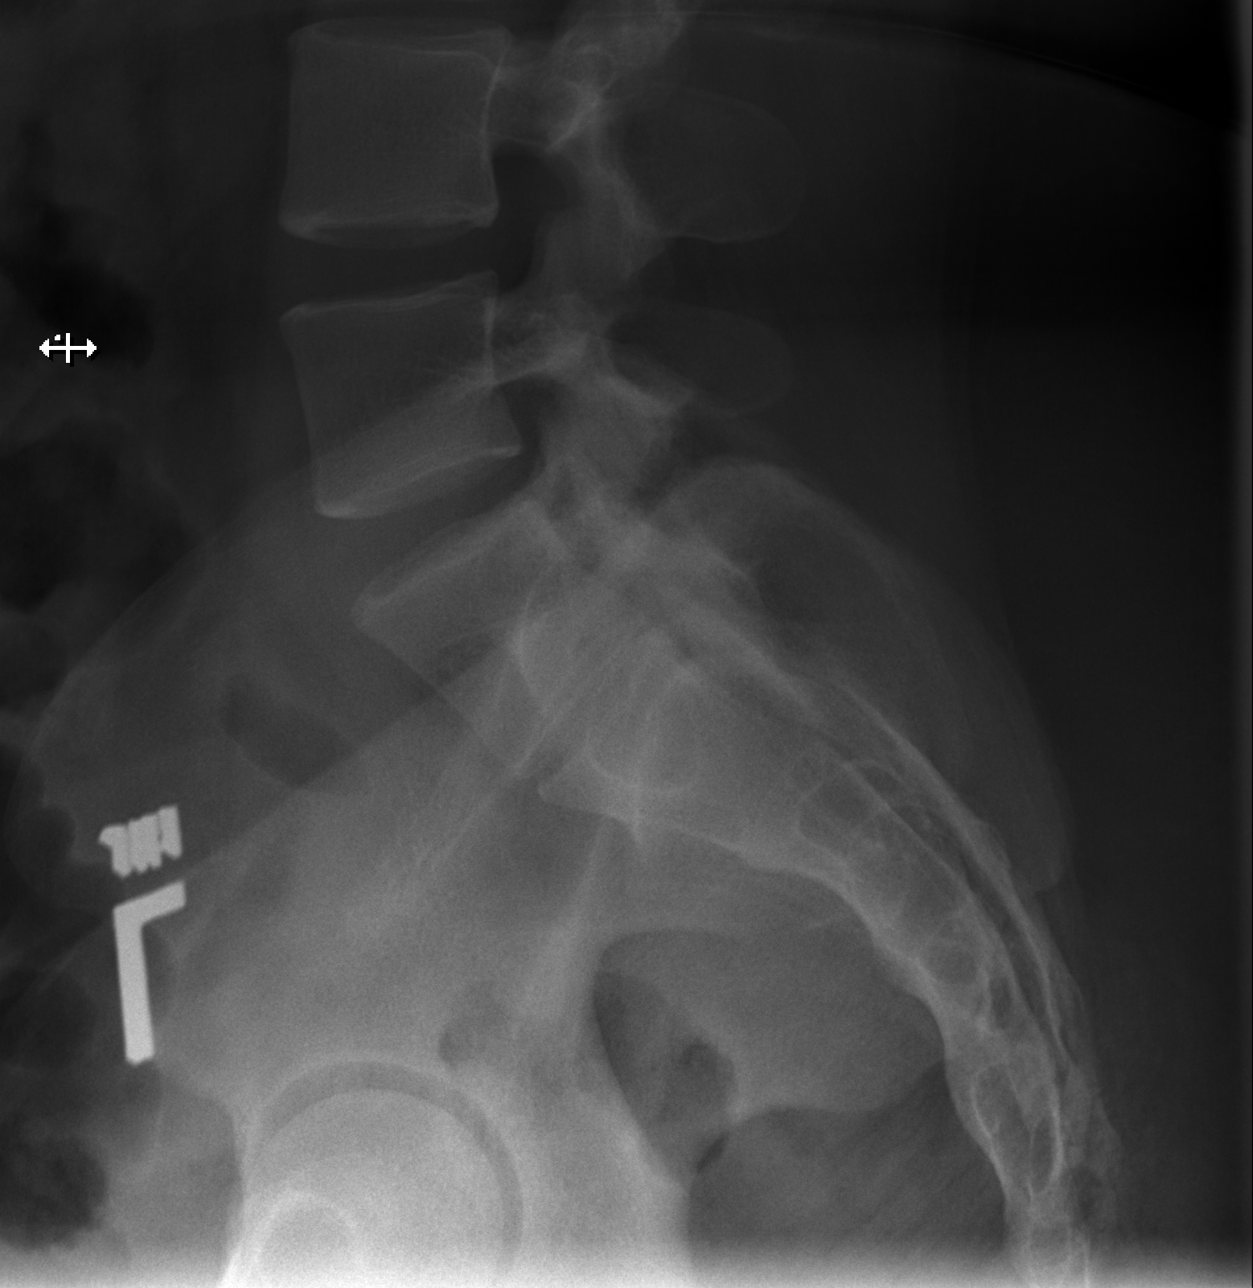

[5 of 5 positions shown; findings below may reference images not displayed]

FINDINGS: Normal alignment of the lumbar vertebral bodies. Disc spaces and
vertebral bodies are maintained. The facets are normally aligned. No
pars defects. The visualized bony pelvis is intact.
IMPRESSION: Normal all lumbar radiographs.

## 2019-11-14 IMAGING — CR DG ANKLE COMPLETE 3+V*R*
3 series · 3 of 3 positions shown · non-contrast
Comparison: None.

CLINICAL DATA: Patient fell on snow today twisting right ankle.

EXAM:
RIGHT ANKLE - COMPLETE 3+ VIEW

[x ankle ap right]
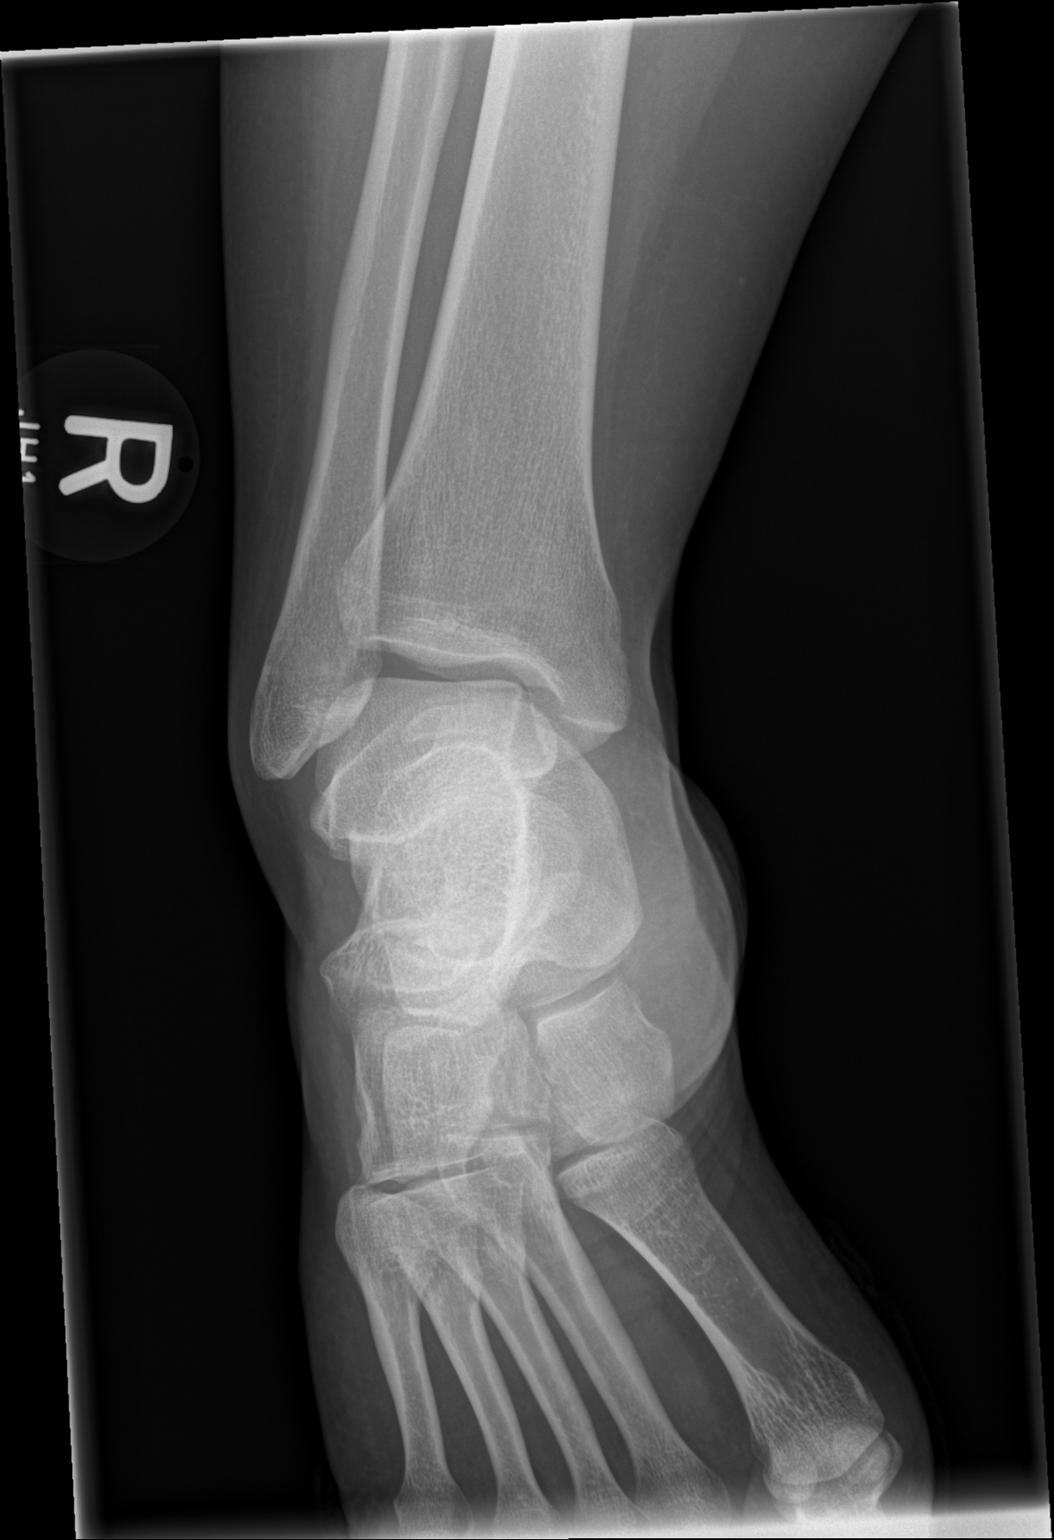

[x ankle obl right]
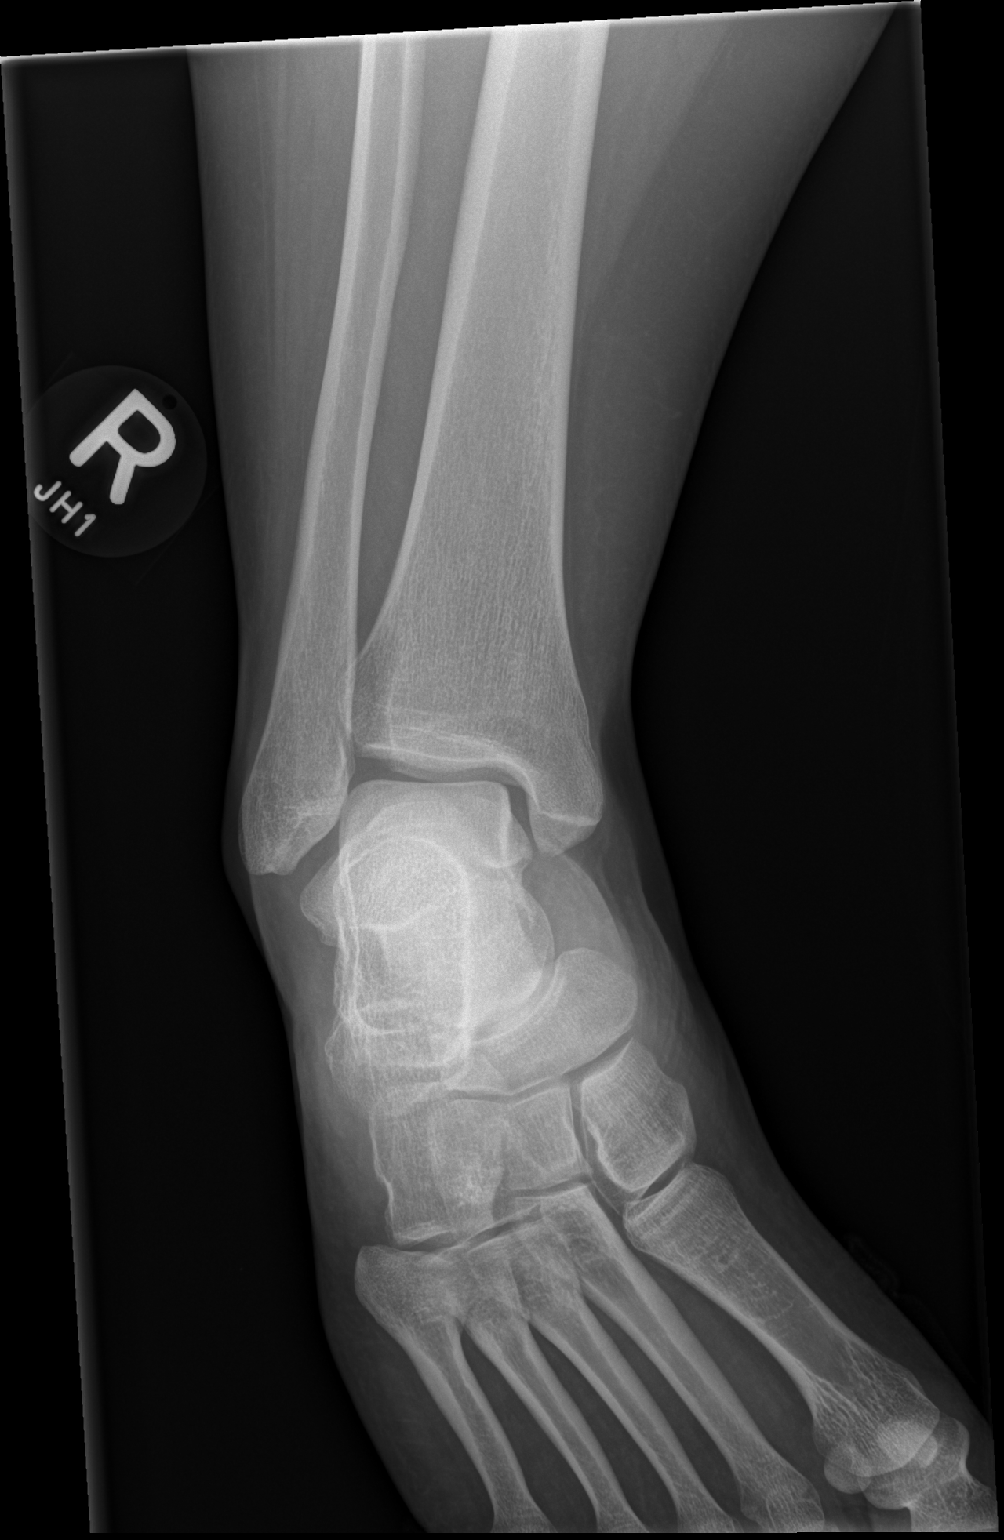

[x ankle lat right]
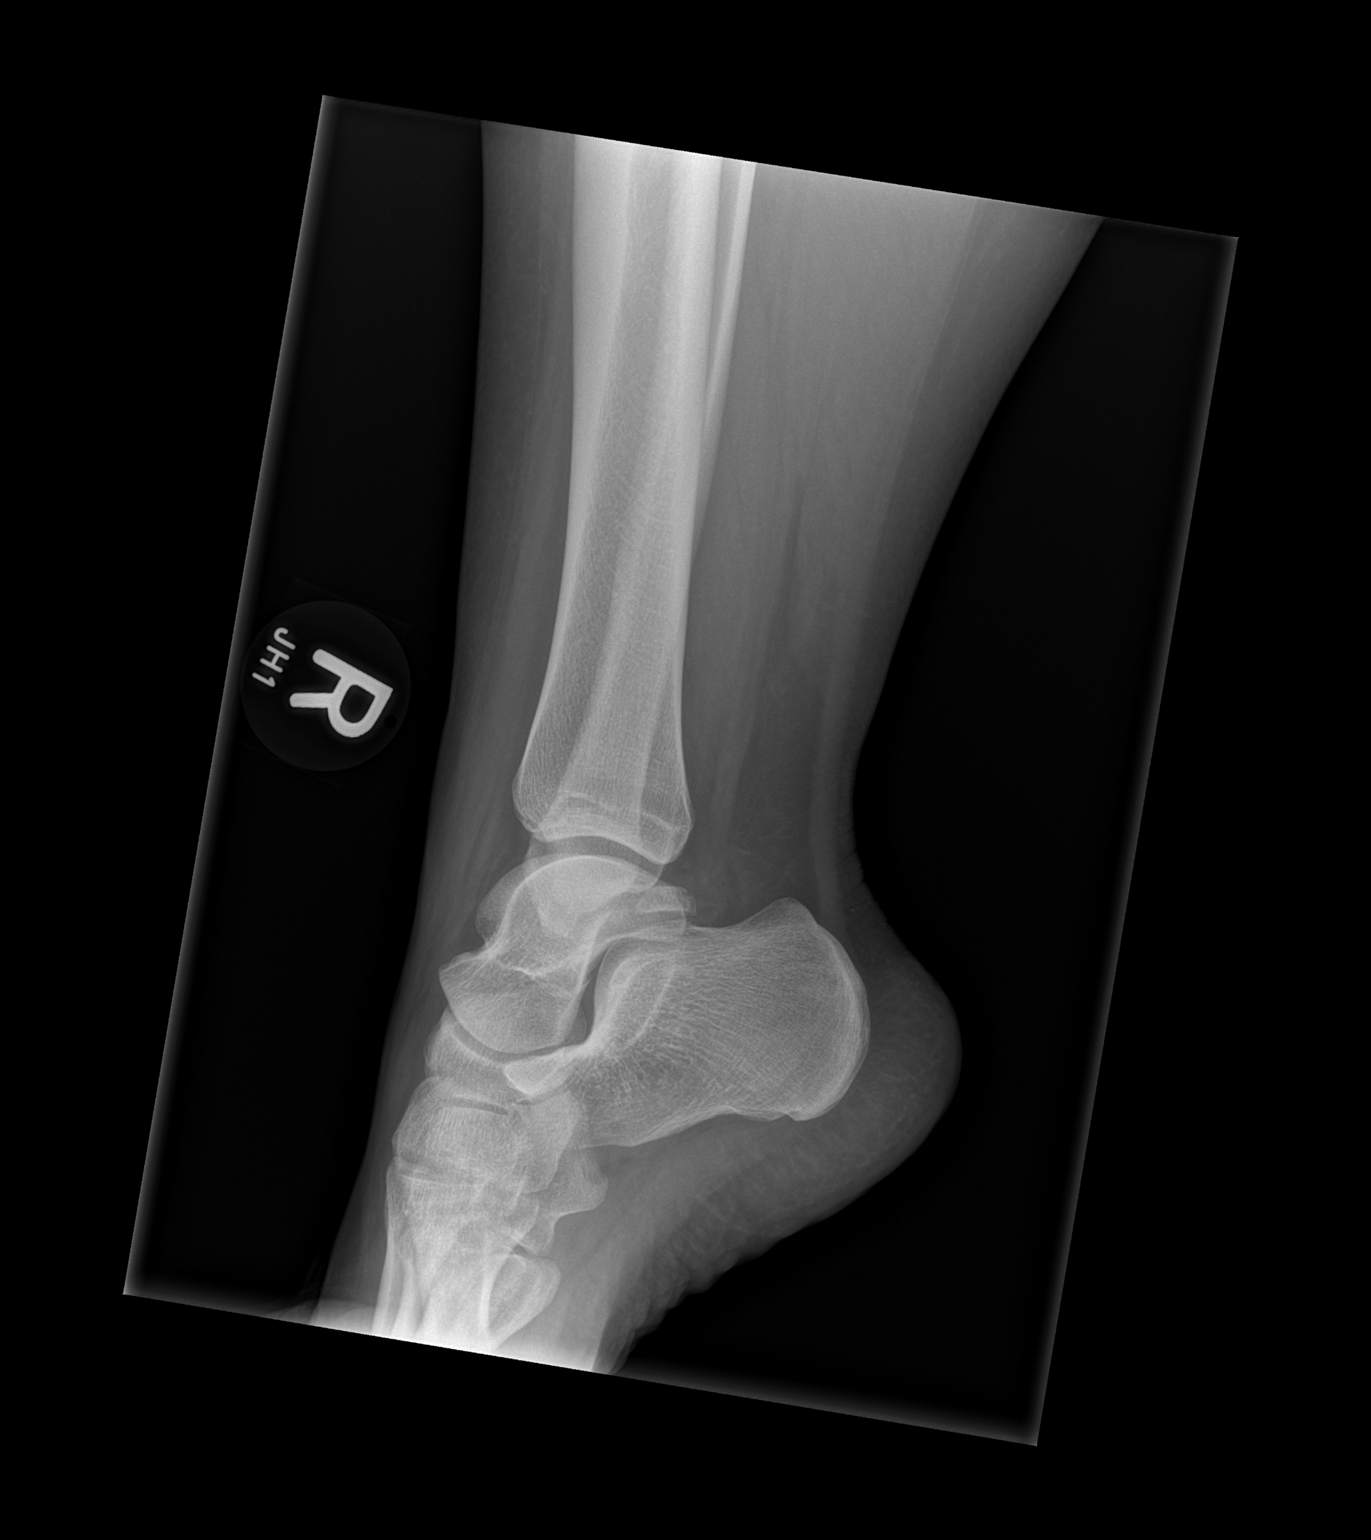

[3 of 3 positions shown; findings below may reference images not displayed]

FINDINGS: There is soft tissue swelling over the malleoli. No fracture or
dislocation is noted. Base of fifth metatarsal appears intact. The
ankle subtalar joints are maintained.
IMPRESSION: Soft tissue swelling about the malleoli. No acute fracture nor joint
dislocation.

## 2023-01-18 ENCOUNTER — Other Ambulatory Visit: Payer: Self-pay

## 2023-01-18 ENCOUNTER — Emergency Department
Admission: EM | Admit: 2023-01-18 | Discharge: 2023-01-18 | Disposition: A | Discharge status: Nursing Home (Includes ICF) | Payer: MEDICAID | Attending: Emergency Medicine | Admitting: Emergency Medicine

## 2023-01-18 DIAGNOSIS — R45851 Suicidal ideations: Secondary | ICD-10-CM | POA: Insufficient documentation

## 2023-01-18 DIAGNOSIS — R000 Tachycardia, unspecified: Secondary | ICD-10-CM | POA: Insufficient documentation

## 2023-01-18 DIAGNOSIS — F418 Other specified anxiety disorders: Secondary | ICD-10-CM | POA: Insufficient documentation

## 2023-01-18 DIAGNOSIS — F32A Depression, unspecified: Secondary | ICD-10-CM

## 2023-01-18 DIAGNOSIS — F419 Anxiety disorder, unspecified: Secondary | ICD-10-CM

## 2023-01-18 LAB — COMPREHENSIVE METABOLIC PANEL
ALT: 10 U/L (ref 0–44)
AST: 12 U/L — ABNORMAL LOW (ref 15–41)
Albumin: 2.9 g/dL — ABNORMAL LOW (ref 3.5–5.0)
Alkaline Phosphatase: 64 U/L (ref 38–126)
Anion gap: 9 (ref 5–15)
BUN: 14 mg/dL (ref 6–20)
CO2: 23 mmol/L (ref 22–32)
Calcium: 8.6 mg/dL — ABNORMAL LOW (ref 8.9–10.3)
Chloride: 105 mmol/L (ref 98–111)
Creatinine, Ser: 0.98 mg/dL (ref 0.44–1.00)
GFR, Estimated: >60 mL/min (ref >60)
Glucose, Bld: 175 mg/dL — ABNORMAL HIGH (ref 70–99)
Potassium: 4.5 mmol/L (ref 3.5–5.1)
Sodium: 137 mmol/L (ref 135–145)
Total Bilirubin: 0.5 mg/dL (ref 0.0–1.2)
Total Protein: 6.7 g/dL (ref 6.5–8.1)

## 2023-01-18 LAB — CBC
HCT: 35 % — ABNORMAL LOW (ref 36.0–46.0)
Hemoglobin: 10.4 g/dL — ABNORMAL LOW (ref 12.0–15.0)
MCH: 22.2 pg — ABNORMAL LOW (ref 26.0–34.0)
MCHC: 29.7 g/dL — ABNORMAL LOW (ref 30.0–36.0)
MCV: 74.6 fL — ABNORMAL LOW (ref 80.0–100.0)
Platelets: 242 10*3/uL (ref 150–400)
RBC: 4.69 MIL/uL (ref 3.87–5.11)
RDW: 17.7 % — ABNORMAL HIGH (ref 11.5–15.5)
WBC: 11.1 10*3/uL — ABNORMAL HIGH (ref 4.0–10.5)
nRBC: 0 % (ref 0.0–0.2)

## 2023-01-18 LAB — ACETAMINOPHEN LEVEL: Acetaminophen (Tylenol), Serum: <10 ug/mL — ABNORMAL LOW (ref 10–30)

## 2023-01-18 LAB — SALICYLATE LEVEL: Salicylate Lvl: <7 mg/dL — ABNORMAL LOW (ref 7.0–30.0)

## 2023-01-18 LAB — ETHANOL: Alcohol, Ethyl (B): <10 mg/dL (ref <10)

## 2023-01-18 NOTE — ED Provider Notes (Addendum)
 [facility] Provider Note    Event Date/Time   First MD Initiated Contact with Patient 01/18/23 0113     (approximate)   History   Psychiatric Evaluation   HPI  Sherry Bryan is a 28 y.o. female   Past medical history of intellectual disability, disruptive mood dysregulation disorder, seizure disorder, borderline personality, depression, PTSD who presents to the emergency department with suicidal ideation in the setting of depression and anxiety, worsening.  She was just hospitalized for psychiatric admission for anxiety and depression in [place]  and was just released yesterday to a new group home.  She tells group home staff that she continues to feel anxious and depressed and has passive suicidal ideation.  She denies self-harm or ingestions and has no plan for suicide, no homicidal ideation or AVH.  She states that she has been compliant with her medications.  Independent Historian contributed to assessment above: Group home staff is at bedside to corroborate information past medical history as above    Physical Exam   Triage Vital Signs: ED Triage Vitals  Encounter Vitals Group     BP 01/18/23 0102 115/78     Systolic BP Percentile --      Diastolic BP Percentile --      Pulse Rate 01/18/23 0102 (!) 102     Resp 01/18/23 0102 18     Temp 01/18/23 0102 98.2 F (36.8 C)     Temp Source 01/18/23 0102 Oral     SpO2 01/18/23 0102 98 %     Weight 01/18/23 0101 297 lb (134.7 kg)     Height 01/18/23 0101 5' 4 (1.626 m)     Head Circumference --      Peak Flow --      Pain Score 01/18/23 0101 0     Pain Loc --      Pain Education --      Exclude from Growth Chart --     Most recent vital signs: Vitals:   01/18/23 0102  BP: 115/78  Pulse: (!) 102  Resp: 18  Temp: 98.2 F (36.8 C)  SpO2: 98%    General: Awake, no distress.  CV:  Good peripheral perfusion.  Resp:  Normal effort.  Abd:  No distention.   Other:  Awake alert comfortable appearing mildly tachycardic otherwise vital signs are normal.  No signs of trauma.  Cooperative and appropriate.  Clear lungs, normal heart sounds.   ED Results / Procedures / Treatments   Labs (all labs ordered are listed, but only abnormal results are displayed) Labs Reviewed  COMPREHENSIVE METABOLIC PANEL - Abnormal; Notable for the following components:      Result Value   Glucose, Bld 175 (*)    Calcium 8.6 (*)    Albumin 2.9 (*)    AST 12 (*)    All other components within normal limits  CBC - Abnormal; Notable for the following components:   WBC 11.1 (*)    Hemoglobin 10.4 (*)    HCT 35.0 (*)    MCV 74.6 (*)    MCH 22.2 (*)    MCHC 29.7 (*)    RDW 17.7 (*)    All other components within normal limits  ETHANOL  SALICYLATE LEVEL  ACETAMINOPHEN  LEVEL  URINE DRUG SCREEN, QUALITATIVE ([facility] ONLY)  POC URINE PREG, ED     I ordered and reviewed the above labs they are notable for white blood cell count mildly elevated 11.1.  Mild microcytic  anemia.  PROCEDURES:  Critical Care performed: No  Procedures   MEDICATIONS ORDERED IN ED: Medications - No data to display  IMPRESSION / MDM / ASSESSMENT AND PLAN / ED COURSE  I reviewed the triage vital signs and the nursing notes.                                Patient's presentation is most consistent with acute presentation with potential threat to life or bodily function.  Differential diagnosis includes, but is not limited to, acute decompensated psychiatric illness, substance-induced mood disorder   MDM:    Here voluntarily seeking psychiatric treatment for depression and anxiety ongoing, no other acute medical complaints and looks well.  Will get her standard lab testing including toxicologic's testing and have psychiatric evaluation.  Voluntary.   -- While awaiting psychiatric evaluation patient has had a change of heart and would like to go back to her group home now.  Since  she was just recently discharged from psychiatric admission and placed into a new group home, she is showing optimism by working with her new staff as she converses with the staff at bedside in the emergency department.  She again denies any plan for suicidality and is optimistic about working with this new veterinary surgeon.  Plan is for discharge.     FINAL CLINICAL IMPRESSION(S) / ED DIAGNOSES   Final diagnoses:  Suicidal ideation     Rx / DC Orders   ED Discharge Orders     None        Note:  This document was prepared using Dragon voice recognition software and may include unintentional dictation errors.    Cyrena Mylar, MD 01/18/23 JANAS    Cyrena Mylar, MD 01/18/23 [phone]

## 2023-01-18 NOTE — ED Notes (Signed)
 Patient is requesting to leave. EDP made aware.

## 2023-01-18 NOTE — ED Notes (Signed)
 Pt belongings:  Pink shirt Pink pants White socks Brown sandals Floral jacket

## 2023-01-18 NOTE — ED Triage Notes (Signed)
 Pt dropped off at ER by group home [facility] [place], pt states she has had SI thoughts for the past few days, pt denies plan. Pt calm and cooperative in triage.

## 2023-01-26 ENCOUNTER — Other Ambulatory Visit: Payer: Self-pay

## 2023-01-26 ENCOUNTER — Encounter: Payer: Self-pay | Admitting: Emergency Medicine

## 2023-01-26 ENCOUNTER — Emergency Department: Payer: MEDICAID

## 2023-01-26 DIAGNOSIS — S99911A Unspecified injury of right ankle, initial encounter: Secondary | ICD-10-CM | POA: Insufficient documentation

## 2023-01-26 DIAGNOSIS — F329 Major depressive disorder, single episode, unspecified: Secondary | ICD-10-CM | POA: Diagnosis present

## 2023-01-26 DIAGNOSIS — W01198A Fall on same level from slipping, tripping and stumbling with subsequent striking against other object, initial encounter: Secondary | ICD-10-CM | POA: Diagnosis not present

## 2023-01-26 DIAGNOSIS — M25571 Pain in right ankle and joints of right foot: Secondary | ICD-10-CM | POA: Diagnosis present

## 2023-01-26 DIAGNOSIS — S83401A Sprain of unspecified collateral ligament of right knee, initial encounter: Secondary | ICD-10-CM | POA: Diagnosis not present

## 2023-01-26 DIAGNOSIS — F32A Depression, unspecified: Secondary | ICD-10-CM | POA: Diagnosis not present

## 2023-01-26 DIAGNOSIS — R45851 Suicidal ideations: Secondary | ICD-10-CM | POA: Diagnosis not present

## 2023-01-26 NOTE — ED Provider Triage Note (Signed)
Emergency Medicine Provider Triage Evaluation Note  Sherry Bryan , a 28 y.o. female  was evaluated in triage.  Pt complains of fall a few days ago,landed on right ankle and right knee. Noticed some bruising and swelling. Has been able to walk. Wants to make sure nothing is broken.   Review of Systems  Positive: Right ankle and right knee pain Negative:   Physical Exam  BP 131/84   Pulse 99   Temp 97.6 F (36.4 C) (Oral)   Resp 18   Ht 5\' 4"  (1.626 m)   Wt 135 kg   SpO2 99%   BMI 51.09 kg/m  Gen:   Awake, no distress   Resp:  Normal effort  MSK:   Moves extremities without difficulty  Other:    Medical Decision Making  Medically screening exam initiated at 7:59 PM.  Appropriate orders placed.  Sherry Bryan was informed that the remainder of the evaluation will be completed by another provider, this initial triage assessment does not replace that evaluation, and the importance of remaining in the ED until their evaluation is complete.     Cameron Ali, PA-C 01/26/23 2000

## 2023-01-26 NOTE — ED Triage Notes (Signed)
Patient ambulatory to triage with complaints of right knee and ankle pain after falling a couple of days ago. Patient states her knee and ankle are bruised and swollen she wants to make sure they're not broken.

## 2023-01-27 ENCOUNTER — Emergency Department (EMERGENCY_DEPARTMENT_HOSPITAL)
Admission: EM | Admit: 2023-01-27 | Discharge: 2023-01-28 | Disposition: A | Discharge status: Home / Self Care | Payer: MEDICAID | Source: Home / Self Care | Attending: Emergency Medicine | Admitting: Emergency Medicine

## 2023-01-27 ENCOUNTER — Emergency Department
Admission: EM | Admit: 2023-01-27 | Discharge: 2023-01-27 | Disposition: A | Discharge status: Home / Self Care | Payer: MEDICAID | Attending: Emergency Medicine | Admitting: Emergency Medicine

## 2023-01-27 ENCOUNTER — Other Ambulatory Visit: Payer: Self-pay

## 2023-01-27 DIAGNOSIS — W01198A Fall on same level from slipping, tripping and stumbling with subsequent striking against other object, initial encounter: Secondary | ICD-10-CM | POA: Insufficient documentation

## 2023-01-27 DIAGNOSIS — R45851 Suicidal ideations: Secondary | ICD-10-CM | POA: Insufficient documentation

## 2023-01-27 DIAGNOSIS — S99911A Unspecified injury of right ankle, initial encounter: Secondary | ICD-10-CM | POA: Insufficient documentation

## 2023-01-27 DIAGNOSIS — F329 Major depressive disorder, single episode, unspecified: Secondary | ICD-10-CM | POA: Insufficient documentation

## 2023-01-27 DIAGNOSIS — S8391XA Sprain of unspecified site of right knee, initial encounter: Secondary | ICD-10-CM

## 2023-01-27 DIAGNOSIS — F32A Depression, unspecified: Secondary | ICD-10-CM

## 2023-01-27 DIAGNOSIS — S83401A Sprain of unspecified collateral ligament of right knee, initial encounter: Secondary | ICD-10-CM | POA: Insufficient documentation

## 2023-01-27 LAB — COMPREHENSIVE METABOLIC PANEL
ALT: 11 U/L (ref 0–44)
AST: 12 U/L — ABNORMAL LOW (ref 15–41)
Albumin: 3.2 g/dL — ABNORMAL LOW (ref 3.5–5.0)
Alkaline Phosphatase: 66 U/L (ref 38–126)
Anion gap: 9 (ref 5–15)
BUN: 12 mg/dL (ref 6–20)
CO2: 25 mmol/L (ref 22–32)
Calcium: 8.6 mg/dL — ABNORMAL LOW (ref 8.9–10.3)
Chloride: 102 mmol/L (ref 98–111)
Creatinine, Ser: 0.89 mg/dL (ref 0.44–1.00)
GFR, Estimated: >60 mL/min (ref >60)
Glucose, Bld: 201 mg/dL — ABNORMAL HIGH (ref 70–99)
Potassium: 4.5 mmol/L (ref 3.5–5.1)
Sodium: 136 mmol/L (ref 135–145)
Total Bilirubin: 0.5 mg/dL (ref 0.0–1.2)
Total Protein: 6.9 g/dL (ref 6.5–8.1)

## 2023-01-27 LAB — CBC
HCT: 36.8 % (ref 36.0–46.0)
Hemoglobin: 10.6 g/dL — ABNORMAL LOW (ref 12.0–15.0)
MCH: 21.7 pg — ABNORMAL LOW (ref 26.0–34.0)
MCHC: 28.8 g/dL — ABNORMAL LOW (ref 30.0–36.0)
MCV: 75.3 fL — ABNORMAL LOW (ref 80.0–100.0)
Platelets: 275 10*3/uL (ref 150–400)
RBC: 4.89 MIL/uL (ref 3.87–5.11)
RDW: 17.7 % — ABNORMAL HIGH (ref 11.5–15.5)
WBC: 7.5 10*3/uL (ref 4.0–10.5)
nRBC: 0 % (ref 0.0–0.2)

## 2023-01-27 LAB — URINE DRUG SCREEN, QUALITATIVE (ARMC ONLY)
Amphetamines, Ur Screen: NOT DETECTED
Barbiturates, Ur Screen: NOT DETECTED
Benzodiazepine, Ur Scrn: NOT DETECTED
Cannabinoid 50 Ng, Ur ~~LOC~~: NOT DETECTED
Cocaine Metabolite,Ur ~~LOC~~: NOT DETECTED
MDMA (Ecstasy)Ur Screen: NOT DETECTED
Methadone Scn, Ur: NOT DETECTED
Opiate, Ur Screen: NOT DETECTED
Phencyclidine (PCP) Ur S: NOT DETECTED
Tricyclic, Ur Screen: NOT DETECTED

## 2023-01-27 LAB — SALICYLATE LEVEL: Salicylate Lvl: <7 mg/dL — ABNORMAL LOW (ref 7.0–30.0)

## 2023-01-27 LAB — ETHANOL: Alcohol, Ethyl (B): <10 mg/dL (ref <10)

## 2023-01-27 LAB — PREGNANCY, URINE: Preg Test, Ur: NEGATIVE

## 2023-01-27 LAB — ACETAMINOPHEN LEVEL: Acetaminophen (Tylenol), Serum: <10 ug/mL — ABNORMAL LOW (ref 10–30)

## 2023-01-27 NOTE — ED Provider Notes (Signed)
[facility] Provider Note    Event Date/Time   First MD Initiated Contact with Patient 01/27/23 1327     (approximate)   History   Suicidal   HPI  Sherry Bryan is a 28 y.o. female with a history of borderline personality disorder, DMDD, and PTSD who presents with suicidal ideation.  The patient states she has had thoughts of harming herself either by overdosing or withholding her medications.  She has not actually attempted to harm herself.  She denies any acute medical complaints.  I reviewed the past medical records.  The patient was most recently evaluated in our system by psychiatry in 2019 after presenting under involuntary commitment due to acute agitation.  She was ultimately cleared by psychiatry.   Physical Exam   Triage Vital Signs: ED Triage Vitals [01/27/23 1235]  Encounter Vitals Group     BP 118/86     Systolic BP Percentile      Diastolic BP Percentile      Pulse Rate (!) 107     Resp 17     Temp 98.2 F (36.8 C)     Temp Source Oral     SpO2 98 %     Weight 250 lb (113.4 kg)     Height 5\' 4"  (1.626 m)     Head Circumference      Peak Flow      Pain Score 0     Pain Loc      Pain Education      Exclude from Growth Chart     Most recent vital signs: Vitals:   01/27/23 1235  BP: 118/86  Pulse: (!) 107  Resp: 17  Temp: 98.2 F (36.8 C)  SpO2: 98%     General: Awake, no distress.  CV:  Good peripheral perfusion.  Resp:  Normal effort.  Abd:  No distention.  Other:  Calm and cooperative.   ED Results / Procedures / Treatments   Labs (all labs ordered are listed, but only abnormal results are displayed) Labs Reviewed  COMPREHENSIVE METABOLIC PANEL - Abnormal; Notable for the following components:      Result Value   Glucose, Bld 201 (*)    Calcium 8.6 (*)    Albumin 3.2 (*)    AST 12 (*)    All other components within normal limits  SALICYLATE LEVEL - Abnormal; Notable for the following components:    Salicylate Lvl <7.0 (*)    All other components within normal limits  ACETAMINOPHEN LEVEL - Abnormal; Notable for the following components:   Acetaminophen (Tylenol), Serum <10 (*)    All other components within normal limits  CBC - Abnormal; Notable for the following components:   Hemoglobin 10.6 (*)    MCV 75.3 (*)    MCH 21.7 (*)    MCHC 28.8 (*)    RDW 17.7 (*)    All other components within normal limits  ETHANOL  URINE DRUG SCREEN, QUALITATIVE ([facility] ONLY)  PREGNANCY, URINE     EKG    RADIOLOGY    PROCEDURES:  Critical Care performed: No  Procedures   MEDICATIONS ORDERED IN ED: Medications - No data to display   IMPRESSION / MDM / ASSESSMENT AND PLAN / ED COURSE  I reviewed the triage vital signs and the nursing notes.  28 year old female with PMH as noted above presents with SI.  She denies any other acute complaints.  On exam she is comfortable appearing with normal vital signs  except for borderline tachycardia.  The patient is cooperative.  Differential diagnosis includes, but is not limited to, major depressive disorder, adjustment disorder, substance-induced mood disorder.    Patient's presentation is most consistent with severe exacerbation of chronic illness.  Lab workup is unremarkable.  UDS is negative.  ASA, APAP, and ethanol are all negative.  I have ordered psychiatry TTS consults.  Disposition will be based on psychiatry team recommendations.   FINAL CLINICAL IMPRESSION(S) / ED DIAGNOSES   Final diagnoses:  Suicidal ideation     Rx / DC Orders   ED Discharge Orders     None        Note:  This document was prepared using Dragon voice recognition software and may include unintentional dictation errors.    Dionne Bucy, MD 01/27/23 1454

## 2023-01-27 NOTE — Discharge Instructions (Addendum)
Take acetaminophen 650 mg and ibuprofen 400 mg every 6 hours for pain.  Take with food.  Have Latoya reach out to [facility] for depression.  [place] Address: [address] [place], [place] [address] Hours:  Closed ? Opens 8?AM Phone: [phone]  Thank you for choosing [place] for your health care today!  Please see your primary doctor this week for a follow up appointment.   If you have any new, worsening, or unexpected symptoms call your doctor right away or come back to the emergency department for reevaluation.  It was my pleasure to care for you today.   Daneil Dan Modesto Charon, MD

## 2023-01-27 NOTE — Consult Note (Signed)
 [facility] Psychiatric Consult Initial  Patient Name: .Sherry Bryan  MRN: [id number]  DOB: 1995-09-02  Consult Order details:  Orders (From admission, onward)     Start     Ordered   01/27/23 1329  CONSULT TO CALL ACT TEAM       Ordering Provider: Dionne Bucy, MD  Provider:  (Not yet assigned)  Question:  Reason for Consult?  Answer:  Psych consult   01/27/23 1328   01/27/23 1329  IP CONSULT TO PSYCHIATRY       Ordering Provider: Dionne Bucy, MD  Provider:  (Not yet assigned)  Question Answer Comment  Place call to: Psych NP   Reason for Consult Consult      01/27/23 1329   01/27/23 1237  CONSULT TO CALL ACT TEAM       Ordering Provider: Dionne Bucy, MD  Provider:  (Not yet assigned)  Question:  Reason for Consult?  Answer:  SI/HI   01/27/23 1236             Mode of Visit: Tele-visit Virtual Statement:TELE PSYCHIATRY ATTESTATION & CONSENT As the provider for this telehealth consult, I attest that I verified the patient's identity using two separate identifiers, introduced myself to the patient, provided my credentials, disclosed my location, and performed this encounter via a HIPAA-compliant, real-time, face-to-face, two-way, interactive audio and video platform and with the full consent and agreement of the patient (or guardian as applicable.) Patient physical location: [facility]. Telehealth provider physical location: home office in state of [place].   Video start time: 900 Video end time:      Psychiatry Consult Evaluation  Service Date: January 28, 2023 LOS:  LOS: 0 days  Chief Complaint SI  Primary Psychiatric Diagnoses      Depression   Assessment  Sherry Bryan is a 28 y.o. female admitted: Presented to the EDfor 01/27/2023  1:25 PM for SI. She carries the psychiatric diagnoses of depression.     Diagnoses:  Active Hospital problems: Active Problems:   Depression    Plan   ## Medical Decision Making Capacity:    ## Safety and  Observation Level:  - Based on my clinical evaluation, I estimate the patient to be at low risk of self harm in the current setting. - At this time, we recommend  routine. This decision is based on my review of the chart including patient's history and current presentation, interview of the patient, mental status examination, and consideration of suicide risk including evaluating suicidal ideation, plan, intent, suicidal or self-harm behaviors, risk factors, and protective factors. This judgment is based on our ability to directly address suicide risk, implement suicide prevention strategies, and develop a safety plan while the patient is in the clinical setting. Please contact our team if there is a concern that risk level has changed.   Thank you for this consult request. Recommendations have been communicated to the primary team.  We will recommend discharge at this time.   Jearld Lesch, NP       History of Present Illness  Relevant Aspects of Hospital ED Course:  Admitted on 01/27/2023 for SI.  Patient Report:  I was having SI and HI but I'm am not having them anymore.  She reports that she is taking her medications as described.     Psych ROS:  Depression: denies Anxiety:  denies  Collateral information:  TTS spoke with the patient's group home owner Wynelle Bourgeois [phone]) at the request of the patient's nurse. Clinical research associate  allowed for the group home owner to express her frustrations regarding the patient's medications needing adjusting and her behavior of returning back and forth to the hospital and the group home. Latoya reports that the patient has a CST Media planner) already established, this Clinical research associate informed Latoya that medication adjustments are usually made by the patients already established psychiatric provider. Latoya then reports that the patient likes to come to the hospital "for attention and get her needs met, once she gets that she is ready to come back." This  Clinical research associate offered some suggestions with utilizing the CST team when the patient is expressing the need to come to hospital, CST teams typically have 24/7 crisis counselors that talk with patients when they are feeling triggered. Latoya reports that she will try and talk with the patient's therapist and CST to utilize better coping skills for the patient and will come pick the patient up "in a few hours."   Review of Systems  Psychiatric/Behavioral:  Negative for depression, hallucinations, substance abuse and suicidal ideas.   All other systems reviewed and are negative.    Psychiatric and Social History    Exam Findings  Physical Exam:  Vital Signs:  Temp:  [98.2 F (36.8 C)-98.3 F (36.8 C)] 98.2 F (36.8 C) (01/18 0447) Pulse Rate:  [86-107] 104 (01/18 0447) Resp:  [16-18] 16 (01/18 0447) BP: (118-129)/(69-86) 129/74 (01/18 0447) SpO2:  [95 %-98 %] 95 % (01/18 0447) Weight:  [113.4 kg] 113.4 kg (01/17 1235) Blood pressure 129/74, pulse (!) 104, temperature 98.2 F (36.8 C), resp. rate 16, height 5\' 4"  (1.626 m), weight 113.4 kg, SpO2 95%. Body mass index is 42.91 kg/m.  Physical Exam Vitals and nursing note reviewed.  HENT:     Head: Normocephalic and atraumatic.     Mouth/Throat:     Mouth: Mucous membranes are moist.  Eyes:     Pupils: Pupils are equal, round, and reactive to light.  Musculoskeletal:        General: Normal range of motion.     Cervical back: Normal range of motion.  Skin:    General: Skin is warm and dry.  Neurological:     Mental Status: She is alert and oriented to person, place, and time.  Psychiatric:        Attention and Perception: Attention and perception normal.        Mood and Affect: Mood and affect normal.        Speech: Speech normal.        Behavior: Behavior is cooperative.        Thought Content: Thought content normal.        Cognition and Memory: Cognition and memory normal.        Judgment: Judgment is impulsive.     Mental  Status Exam: General Appearance: Casual  Orientation:  Full (Time, Place, and Person)  Memory:  Immediate;   Fair  Concentration:  Concentration: Fair  Recall:  Good  Attention  Poor  Eye Contact:  Fair  Speech:  Clear and Coherent  Language:  Good  Volume:  Normal  Mood: euthymic  Affect:  Congruent  Thought Process:  Coherent  Thought Content:  WDL  Suicidal Thoughts:  No  Homicidal Thoughts:  No  Judgement:  Fair  Insight:  Fair  Psychomotor Activity:  Normal  Akathisia:  NA  Fund of Knowledge:  Good      Assets:  Communication Skills Desire for Improvement  Cognition:  WNL  ADL's:  Intact  AIMS (if indicated):        Other History   These have been pulled in through the EMR, reviewed, and updated if appropriate.  Family History:  The patient's family history is not on file.  Medical History: Past Medical History:  Diagnosis Date   Borderline personality disorder (HCC)    Constipation 05/15/2015   Depression    DMDD (disruptive mood dysregulation disorder) (HCC) 05/15/2015   History of seasonal allergies 05/15/2015   Hx of gastroesophageal reflux (GERD) 05/15/2015   Hx of seizure disorder 05/15/2015   Intellectual disability 05/22/2015   PTSD (post-traumatic stress disorder)    Seizures (HCC)    last one in 2015   Suicidal ideation     Surgical History: No past surgical history on file.   Medications:  No current facility-administered medications for this encounter.  Current Outpatient Medications:    Ascorbic Acid (VITAMIN C PO), Take 1 tablet by mouth daily., Disp: , Rfl:    cholecalciferol (VITAMIN D) 1000 units tablet, Take 1,000 Units by mouth daily., Disp: , Rfl:    Cyanocobalamin (VITAMIN B 12 PO), Take 1 tablet by mouth daily., Disp: , Rfl:    docusate sodium (COLACE) 100 MG capsule, Take 100 mg by mouth 2 (two) times daily. , Disp: , Rfl:    fluticasone (FLONASE) 50 MCG/ACT nasal spray, Place 2 sprays into the nose daily as needed for allergies.,  Disp: , Rfl:    guaiFENesin (MUCINEX) 600 MG 12 hr tablet, Take 600 mg by mouth 2 (two) times daily., Disp: , Rfl:    guanFACINE (TENEX) 1 MG tablet, Take 0.5 mg by mouth 2 (two) times daily. , Disp: , Rfl:    levETIRAcetam (KEPPRA) 750 MG tablet, Take 1,500 mg by mouth 2 (two) times daily., Disp: , Rfl:    lithium 600 MG capsule, Take 600 mg by mouth 2 (two) times daily with a meal., Disp: , Rfl:    lithium carbonate 300 MG capsule, Take 600 mg by mouth 2 (two) times daily with a meal., Disp: , Rfl:    loratadine (CLARITIN) 10 MG tablet, Take 10 mg by mouth daily., Disp: , Rfl:    meclizine (ANTIVERT) 25 MG tablet, Take 25 mg by mouth 3 (three) times daily as needed for nausea., Disp: , Rfl:    medroxyPROGESTERone (DEPO-PROVERA) 150 MG/ML injection, Inject 150 mg into the muscle every 3 (three) months., Disp: , Rfl:    Multiple Vitamins-Minerals (MULTIVITAMIN ADULTS) TABS, Take 1 tablet by mouth daily., Disp: , Rfl:    nystatin (MYCOSTATIN/NYSTOP) powder, Apply 1 Bottle topically at bedtime as needed (RASH)., Disp: , Rfl:    omeprazole (PRILOSEC) 20 MG capsule, Take 40 mg by mouth daily. , Disp: , Rfl:    OXcarbazepine (TRILEPTAL) 150 MG tablet, Take 75 mg by mouth daily., Disp: , Rfl:    polyethylene glycol powder (GLYCOLAX/MIRALAX) powder, Take 17 g by mouth daily as needed for constipation., Disp: , Rfl:    pyridOXINE (B-6) 50 MG tablet, Take 50 mg by mouth daily., Disp: , Rfl:    pyridOXINE (VITAMIN B-6) 50 MG tablet, Take 50 mg by mouth daily., Disp: , Rfl:    sertraline (ZOLOFT) 50 MG tablet, Take 50 mg by mouth daily. , Disp: , Rfl:    topiramate (TOPAMAX) 25 MG tablet, Take 25 mg by mouth 2 (two) times daily., Disp: , Rfl:    traZODone (DESYREL) 100 MG tablet, Take 300 mg by mouth at bedtime., Disp: , Rfl:  Allergies: Allergies  Allergen Reactions   Ritalin [Methylphenidate Hcl] Other (See Comments)    seizures   Abilify [Aripiprazole] Other (See Comments)    Shaking or tremors     Kristyn Obyrne Damaris Hippo, NP

## 2023-01-27 NOTE — ED Notes (Signed)
Pt contracts for safety while in the ED. Reports plan to overdose on her meds, when RN inquired which meds she states "I have many that I could overdose on".

## 2023-01-27 NOTE — ED Notes (Signed)
Pt let this RN know that they are no longer suicidal.

## 2023-01-27 NOTE — ED Provider Notes (Signed)
[facility] Provider Note    Event Date/Time   First MD Initiated Contact with Patient 01/27/23 [phone]     (approximate)   History   Fall   HPI  Sherry Bryan is a 28 y.o. female   Past medical history of intellectual disability, psychiatric comorbidities, here with right ankle pain and right knee pain after a slip and fall onto the buttocks last week.  Has been ambulatory but continues to have pain and swelling to the right knee and right ankle.  No head strike no other injuries no loss of consciousness no blood thinners.     Physical Exam   Triage Vital Signs: ED Triage Vitals  Encounter Vitals Group     BP 01/26/23 1957 131/84     Systolic BP Percentile --      Diastolic BP Percentile --      Pulse Rate 01/26/23 1957 99     Resp 01/26/23 1957 18     Temp 01/26/23 1957 97.6 F (36.4 C)     Temp Source 01/26/23 1957 Oral     SpO2 01/26/23 1957 99 %     Weight 01/26/23 1958 297 lb 9.9 oz (135 kg)     Height 01/26/23 1958 5\' 4"  (1.626 m)     Head Circumference --      Peak Flow --      Pain Score 01/26/23 1957 6     Pain Loc --      Pain Education --      Exclude from Growth Chart --     Most recent vital signs: Vitals:   01/26/23 1957  BP: 131/84  Pulse: 99  Resp: 18  Temp: 97.6 F (36.4 C)  SpO2: 99%    General: Awake, no distress.  CV:  Good peripheral perfusion.  Resp:  Normal effort.  Abd:  No distention.  Other:  Ambulatory with full range of motion to the affected joints right knee and right ankle that are tender to palpation   ED Results / Procedures / Treatments   Labs (all labs ordered are listed, but only abnormal results are displayed) Labs Reviewed - No data to display  RADIOLOGY I independently reviewed and interpreted the knee x-ray and see no obvious fracture or dislocation I also reviewed radiologist's formal read.   PROCEDURES:  Critical Care performed: No  Procedures   MEDICATIONS ORDERED IN  ED: Medications - No data to display  IMPRESSION / MDM / ASSESSMENT AND PLAN / ED COURSE  I reviewed the triage vital signs and the nursing notes.                                Patient's presentation is most consistent with acute presentation with potential threat to life or bodily function.  Differential diagnosis includes, but is not limited to, knee or ankle fracture or dislocation, ligamentous injury   MDM:     Subacute injury to the right lower extremity most affecting the knee and ankle, with full active range of motion, mild swelling and bony tenderness with negative x-rays.  Pain in the ankle is worse, given ankle brace.  Will discharge and give PMD referral.  Upon discharge she states that she has depressed and having passive suicidal ideation with the intention to cut herself for stress release.  These are longstanding issues.  She has a caretaker at home who has referral information for [facility] outpatient  psychiatric services.  I do not think she is an emergent threat to herself or society at this time and has outpatient follow-up.  Plan is for discharge.  Return precautions given.      FINAL CLINICAL IMPRESSION(S) / ED DIAGNOSES   Final diagnoses:  Sprain of right knee, unspecified ligament, initial encounter  Right ankle injury, initial encounter     Rx / DC Orders   ED Discharge Orders          Ordered    Ambulatory Referral to Primary Care (Establish Care)        01/27/23 0105             Note:  This document was prepared using Dragon voice recognition software and may include unintentional dictation errors.    Pilar Jarvis, MD 01/27/23 [phone]

## 2023-01-27 NOTE — ED Notes (Addendum)
Called and spoke with Langley Adie at [phone] at [facility], she is familiar with patient. Not currently at the office so she could not provide legal guardian info but stated she would send someone to pick up patient at this time.

## 2023-01-27 NOTE — ED Notes (Signed)
Group home staff member informed ok for him to leave and we will call if pt gets dc.

## 2023-01-27 NOTE — ED Notes (Signed)
RN noted that pt was up for discharge.  RN attempted to call pt legal guardian twice with no answer.  RN left message with call back number.  RN called group home and spoke with Wynelle Bourgeois.  Ms. Eulah Pont relayed recent Hx of pt complaints and concern for pt medication needs.  Ms. Eulah Pont stated she did not feel that this pt has received an appropriate level of care.  RN told Ms. Eulah Pont that RN would relay this information and would attempt to get the psych representative to call her.    This RN then called the TTS Jamilla and relayed this information and asked her to call the group home.  RN gave TTS the group home number and she stated she would call.

## 2023-01-27 NOTE — ED Notes (Signed)
Pt states that they are ready to leave and since they are voluntary they would like to go home. Pt has been told that they will need to be evaluated by the psych team and/or the EDP again before they can leave. TTS has been notified and EDP has been made aware.

## 2023-01-27 NOTE — ED Notes (Signed)
Vol pending consult 

## 2023-01-27 NOTE — ED Triage Notes (Signed)
Pt sts that she has been having SI and HI thoughts for the last weeks. Pt sts that she stays at a [facility] in [place]. Pt sts that she wants to overdose and cut her wrist.

## 2023-01-27 NOTE — BH Assessment (Signed)
This Clinical research associate spoke with the patient's group home owner Wynelle Bourgeois [phone]) at the request of the patient's nurse. Writer allowed for the group home owner to express her frustrations regarding the patient's medications needing adjusting and her behavior of returning back and forth to the hospital and the group home. Latoya reports that the patient has a CST Media planner) already established, this Clinical research associate informed Latoya that medication adjustments are usually made by the patients already established psychiatric provider. Latoya then reports that the patient likes to come to the hospital "for attention and get her needs met, once she gets that she is ready to come back." This Clinical research associate offered some suggestions with utilizing the CST team when the patient is expressing the need to come to hospital, CST teams typically have 24/7 crisis counselors that talk with patients when they are feeling triggered. Latoya reports that she will try and talk with the patient's therapist and CST to utilize better coping skills for the patient and will come pick the patient up "in a few hours."

## 2023-01-27 NOTE — ED Notes (Signed)
VOL pending consult moved to bhu 4

## 2023-01-27 NOTE — ED Notes (Addendum)
Pt changed in to psych safe clothing.  Pt has... Ankle brace Grey shirt Pink pants One brown shoe Black and pink underwear Brown and pink coat

## 2023-01-27 NOTE — ED Notes (Signed)
Patient sized and ankle brace applied. Provided with discharge instructions including follow up appt as needed with stated understanding. Patient stable to waiting room to await ride from group home.

## 2023-01-27 NOTE — ED Notes (Signed)
Pt given the phone to call their emergency contact.

## 2023-01-27 NOTE — ED Notes (Signed)
Pt given meal tray.

## 2023-01-27 NOTE — Discharge Instructions (Signed)
Please seek medical attention for any high fevers, chest pain, shortness of breath, change in behavior, persistent vomiting, bloody stool or any other new or concerning symptoms.  

## 2023-01-27 NOTE — ED Notes (Signed)
Attempted to call legal guardian and group home number listed for patient with both going straight to VM.

## 2023-01-28 DIAGNOSIS — F32A Depression, unspecified: Secondary | ICD-10-CM

## 2023-01-28 NOTE — ED Notes (Signed)
TTS reports speaking with pt group home.  Group home rep Glee Arvin stated she would arrive to pick up pt "in a couple hours," but was unable to give more specific time.

## 2023-01-28 NOTE — ED Notes (Signed)
Group home rep Latoya called RN and stated a driver for the group home would arrive at 0445.  RN prepared pt for discharge, provided pt belongings, and will escort pt to front door.
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
## 2023-01-29 ENCOUNTER — Other Ambulatory Visit: Payer: Self-pay

## 2023-01-29 ENCOUNTER — Emergency Department
Admission: EM | Admit: 2023-01-29 | Discharge: 2023-01-30 | Disposition: A | Discharge status: Other Acute Inpatient Hospital | Payer: MEDICAID | Attending: Student in an Organized Health Care Education/Training Program | Admitting: Student in an Organized Health Care Education/Training Program

## 2023-01-29 DIAGNOSIS — R45851 Suicidal ideations: Secondary | ICD-10-CM

## 2023-01-29 DIAGNOSIS — Y900 Blood alcohol level of less than 20 mg/100 ml: Secondary | ICD-10-CM | POA: Insufficient documentation

## 2023-01-29 DIAGNOSIS — F319 Bipolar disorder, unspecified: Secondary | ICD-10-CM | POA: Insufficient documentation

## 2023-01-29 LAB — COMPREHENSIVE METABOLIC PANEL
ALT: 11 U/L (ref 0–44)
AST: 11 U/L — ABNORMAL LOW (ref 15–41)
Albumin: 3.5 g/dL (ref 3.5–5.0)
Alkaline Phosphatase: 72 U/L (ref 38–126)
Anion gap: 12 (ref 5–15)
BUN: 15 mg/dL (ref 6–20)
CO2: 22 mmol/L (ref 22–32)
Calcium: 9.2 mg/dL (ref 8.9–10.3)
Chloride: 102 mmol/L (ref 98–111)
Creatinine, Ser: 1 mg/dL (ref 0.44–1.00)
GFR, Estimated: >60 mL/min (ref >60)
Glucose, Bld: 133 mg/dL — ABNORMAL HIGH (ref 70–99)
Potassium: 4.2 mmol/L (ref 3.5–5.1)
Sodium: 136 mmol/L (ref 135–145)
Total Bilirubin: 0.6 mg/dL (ref 0.0–1.2)
Total Protein: 7.5 g/dL (ref 6.5–8.1)

## 2023-01-29 LAB — POC URINE PREG, ED: Preg Test, Ur: NEGATIVE

## 2023-01-29 LAB — ETHANOL: Alcohol, Ethyl (B): <10 mg/dL (ref <10)

## 2023-01-29 LAB — CBC
HCT: 40.4 % (ref 36.0–46.0)
Hemoglobin: 11.9 g/dL — ABNORMAL LOW (ref 12.0–15.0)
MCH: 21.8 pg — ABNORMAL LOW (ref 26.0–34.0)
MCHC: 29.5 g/dL — ABNORMAL LOW (ref 30.0–36.0)
MCV: 74 fL — ABNORMAL LOW (ref 80.0–100.0)
Platelets: 304 10*3/uL (ref 150–400)
RBC: 5.46 MIL/uL — ABNORMAL HIGH (ref 3.87–5.11)
RDW: 18.4 % — ABNORMAL HIGH (ref 11.5–15.5)
WBC: 11.3 10*3/uL — ABNORMAL HIGH (ref 4.0–10.5)
nRBC: 0 % (ref 0.0–0.2)

## 2023-01-29 LAB — SALICYLATE LEVEL: Salicylate Lvl: <7 mg/dL — ABNORMAL LOW (ref 7.0–30.0)

## 2023-01-29 LAB — ACETAMINOPHEN LEVEL: Acetaminophen (Tylenol), Serum: <10 ug/mL — ABNORMAL LOW (ref 10–30)

## 2023-01-29 NOTE — ED Notes (Signed)
VOL, pend psych consult 

## 2023-01-29 NOTE — ED Notes (Signed)
 Dinner tray provided; Pt tolerated well; Waste disposed of properly.

## 2023-01-29 NOTE — ED Notes (Signed)
Pt asked for a snack. Gave pt peanut butter graham crackers and cereal
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
## 2023-01-29 NOTE — ED Triage Notes (Signed)
Pt walks into triage and says she is having suicidal thoughts. Pt states, "I want to cut myself and overdose on my medicine." Pt reports the staff at the group home manages her medications at the group home, and she does not have access to them.

## 2023-01-29 NOTE — ED Notes (Signed)
 Pt given water and blanket

## 2023-01-29 NOTE — Consult Note (Signed)
[facility] Psychiatric Consult Initial  Patient Name: .Sherry Bryan  MRN: [id number]  DOB: 02-07-1995  Consult Order details:  Orders (From admission, onward)     Start     Ordered   01/29/23 1230  CONSULT TO CALL ACT TEAM       Ordering Provider: Willy Eddy, MD  Provider:  (Not yet assigned)  Question:  Reason for Consult?  Answer:  Psych consult   01/29/23 1229   01/29/23 1230  IP CONSULT TO PSYCHIATRY       Ordering Provider: Willy Eddy, MD  Provider:  (Not yet assigned)  Question Answer Comment  Place call to: ed   Reason for Consult Admit      01/29/23 1229   01/29/23 1141  CONSULT TO CALL ACT TEAM       Ordering Provider: Willy Eddy, MD  Provider:  (Not yet assigned)  Question:  Reason for Consult?  Answer:  suicidal thoughts   01/29/23 1140             Mode of Visit: Tele-visit Virtual Statement:TELE PSYCHIATRY ATTESTATION & CONSENT As the provider for this telehealth consult, I attest that I verified the patient's identity using two separate identifiers, introduced myself to the patient, provided my credentials, disclosed my location, and performed this encounter via a HIPAA-compliant, real-time, face-to-face, two-way, interactive audio and video platform and with the full consent and agreement of the patient (or guardian as applicable.) Patient physical location: [facility] ED. Telehealth provider physical location: home office in state of [place].   Video start time: 1pm Video end time: 1:30 pm    Psychiatry Consult Evaluation  Service Date: January 29, 2023 LOS:  LOS: 0 days  Chief Complaint suicidal ideations  Primary Psychiatric Diagnoses  Suicidal ideation   Assessment  Sherry Bryan is a 28 y.o. female presents to the ED from her group home due to suicidal ideations.  Patient reports a plan to cut herself or overdose on medications.  Per group home owner, patient has been admitted to her current supportive living environment since the  eighth of this month and request to go to the emergency department if she does not get her way.  Per group home owner, patient wanted coffee this morning and was asked to wait, causing patient to experience suicidal ideations and present to the ED.  For her medical record patient has a psychiatric history of disruptive mood dysregulation disorder, ID, MDD, borderline personality disorder, suicidal ideation and bipolar depression.  Patient has a history of recent psychiatric hospitalization.  Her group home owner is working to establish a one-on-one provider to better manage patient and her supportive living environment.   Please see plan below for detailed recommendations.   Diagnoses:  Active Hospital problems: Active Problems:   * No active hospital problems. *    Plan   ## Psychiatric Medication Recommendations:    ## Medical Decision Making Capacity: Patient has a guardian and has thus been adjudicated incompetent; please involve patients guardian in medical decision making  ## Further Work-up:     ## Disposition:--Continue to monitor symptoms and reassess in the a.m.  ## Behavioral / Environmental: - No specific recommendations at this time.     ## Safety and Observation Level:  - Based on my clinical evaluation, I estimate the patient to be at low risk of self harm in the current setting. - At this time, we recommend  routine. This decision is based on my review of the chart including patient's  history and current presentation, interview of the patient, mental status examination, and consideration of suicide risk including evaluating suicidal ideation, plan, intent, suicidal or self-harm behaviors, risk factors, and protective factors. This judgment is based on our ability to directly address suicide risk, implement suicide prevention strategies, and develop a safety plan while the patient is in the clinical setting. Please contact our team if there is a concern that risk level  has changed.  CSSR Risk Category:C-SSRS RISK CATEGORY: High Risk  Suicide Risk Assessment: Patient has following modifiable risk factors for suicide: active suicidal ideation, which we are addressing by observation. Patient has following non-modifiable or demographic risk factors for suicide: history of self harm behavior   Thank you for this consult request. Recommendations have been communicated to the primary team.  Recurrent ED presentations may be behavioral.  Patient does report active suicidal ideations with a plan.  We will continue to observe symptoms and reassess in the a.m. at this time.   Mcneil Sober, NP       History of Present Illness    Psych ROS:  Depression: Yes Anxiety: No Mania (lifetime and current): Denies Psychosis: (lifetime and current): Reports auditory hallucinations periodically  Collateral information:  Contacted [facility] group home owner   Review of Systems  Psychiatric/Behavioral:  Positive for depression and suicidal ideas.   All other systems reviewed and are negative.    Psychiatric and Social History  Psychiatric History:  Information collected from patient and group home owner  Prev Dx/Sx: See above Current Psych Provider: At supportive living environment Home Meds (current): Awaiting med review Previous Med Trials:  Therapy:   Prior Psych Hospitalization: Yes Prior Self Harm: Yes Prior Violence: Yes  Family Psych History: Denies Family Hx suicide: Denies  Social History:  Developmental Hx: Yes Educational Hx:  Occupational Hx: Unemployed Legal Hx: Denies Living Situation: Supportive living environment Spiritual Hx: NA Access to weapons/lethal means: Does not have access to guns  Substance History Alcohol: Denies Tobacco: Denies Illicit drugs: Denies Prescription drug abuse: Denies Rehab hx: Denies  Exam Findings  Physical Exam:  Vital Signs:  Temp:  [97.8 F (36.6 C)] 97.8 F (36.6 C) (01/19 1138) Pulse Rate:   [97] 97 (01/19 1138) Resp:  [16] 16 (01/19 1138) BP: (104)/(81) 104/81 (01/19 1138) SpO2:  [95 %] 95 % (01/19 1138) Blood pressure 104/81, pulse 97, temperature 97.8 F (36.6 C), temperature source Oral, resp. rate 16, SpO2 95%. There is no height or weight on file to calculate BMI.  Physical Exam Constitutional:      Appearance: She is obese.  Neurological:     Mental Status: She is alert and oriented to person, place, and time.     Mental Status Exam: General Appearance: Casual  Orientation: Full  Memory: Good  Concentration: Good  Recall: Good  Attention good  Eye Contact: Good  Speech: Clear  Language: Good  Volume: Normal  Mood: Depressed  Affect: Congruent  Thought Process: Goal directed  Thought Content: Illogical  Suicidal Thoughts: Yes  Homicidal Thoughts: No  Judgement: Poor  Insight: Lacking  Psychomotor Activity: Normal  Akathisia: No  Fund of Knowledge: Fair      Assets:  Communication Skills  Cognition:  WNL  ADL's:  Intact  AIMS (if indicated):       Other History   These have been pulled in through the EMR, reviewed, and updated if appropriate.  Family History:  The patient's family history is not on file.  Medical History: Past  Medical History:  Diagnosis Date   Borderline personality disorder (HCC)    Constipation 05/15/2015   Depression    DMDD (disruptive mood dysregulation disorder) (HCC) 05/15/2015   History of seasonal allergies 05/15/2015   Hx of gastroesophageal reflux (GERD) 05/15/2015   Hx of seizure disorder 05/15/2015   Intellectual disability 05/22/2015   PTSD (post-traumatic stress disorder)    Seizures (HCC)    last one in 2015   Suicidal ideation     Surgical History: No past surgical history on file.   Medications:  No current facility-administered medications for this encounter.  Current Outpatient Medications:    docusate sodium (COLACE) 100 MG capsule, Take 100 mg by mouth 2 (two) times daily. , Disp: , Rfl:     fluconazole (DIFLUCAN) 100 MG tablet, Take 100 mg by mouth daily., Disp: , Rfl:    fluticasone (FLONASE) 50 MCG/ACT nasal spray, Place 1 spray into the nose 2 (two) times daily as needed for allergies., Disp: , Rfl:    guanFACINE (TENEX) 1 MG tablet, Take 0.5 mg by mouth 2 (two) times daily. , Disp: , Rfl:    hydrOXYzine (ATARAX) 50 MG tablet, Take 50 mg by mouth 3 (three) times daily as needed., Disp: , Rfl:    levETIRAcetam (KEPPRA) 750 MG tablet, Take 1,500 mg by mouth 2 (two) times daily., Disp: , Rfl:    lithium 300 MG tablet, Take 600 mg by mouth 2 (two) times daily., Disp: , Rfl:    loratadine (CLARITIN) 10 MG tablet, Take 10 mg by mouth daily., Disp: , Rfl:    meclizine (ANTIVERT) 25 MG tablet, Take 25 mg by mouth 3 (three) times daily as needed for nausea., Disp: , Rfl:    Multiple Vitamins-Minerals (MULTIVITAMIN ADULTS) TABS, Take 1 tablet by mouth daily., Disp: , Rfl:    nystatin (MYCOSTATIN/NYSTOP) powder, Apply 1 Bottle topically at bedtime as needed (RASH)., Disp: , Rfl:    omeprazole (PRILOSEC) 40 MG capsule, Take 40 mg by mouth daily., Disp: , Rfl:    OXcarbazepine (TRILEPTAL) 150 MG tablet, Take 75 mg by mouth daily., Disp: , Rfl:    topiramate (TOPAMAX) 50 MG tablet, Take 50 mg by mouth 2 (two) times daily., Disp: , Rfl:    VENTOLIN HFA 108 (90 Base) MCG/ACT inhaler, Inhale 2 puffs into the lungs every 6 (six) hours as needed for wheezing or shortness of breath., Disp: , Rfl:   Allergies: Allergies  Allergen Reactions   Ritalin [Methylphenidate Hcl] Other (See Comments)    seizures   Abilify [Aripiprazole] Other (See Comments)    Shaking or tremors    Mcneil Sober, NP

## 2023-01-29 NOTE — BH Assessment (Signed)
Comprehensive Clinical Assessment (CCA) Screening, Triage and Referral Note  01/29/2023 Sherry Bryan [id number]  Sherry Bryan, 28 year old female who presents to [facility] ED voluntarily for treatment. Per triage note, Pt walks into triage and says she is having suicidal thoughts. Pt states, "I want to cut myself and overdose on my medicine." Pt reports the staff at the group home manages her medications at the group home, and she does not have access to them.   During TTS assessment pt presents alert and oriented x 4, restless but cooperative, and mood-congruent with affect. The pt does not appear to be responding to internal or external stimuli. Neither is the pt presenting with any delusional thinking. Pt verified the information provided to triage RN.   Pt identifies her main complaint to be that she is homesick and misses her family. Patient states she has been feeling suicidal and depressed for about a week since moving to a group home. Patient reports no complaints with the group home or staff. When asked why her family would place her in a group home, patient responded that her behavior was bad. Patient was seen at [facility] 2 days ago. Patient states the thoughts have worsened and she has a plan to overdose. Patient reports having poor sleep and an "okay" appetite. Patient states she sleeps at least 4 hours a night. Patient denies using any illicit substances and alcohol. Pt denies current HI/AH/VH.    Per Cranston Neighbor, NP, pt is recommended for overnight observation to be reassessed in the morning   Chief Complaint:  Chief Complaint  Patient presents with   Suicidal   Visit Diagnosis: Suicidal ideation  Patient Reported Information How did you hear about [place]? Self  What Is the Reason for Your Visit/Call Today? Patient reports she misses her family and having SI.  How Long Has This Been Causing You Problems? 1 wk - 1 month  What Do You Feel Would Help You the Most Today? Treatment for  Depression or other mood problem   Have You Recently Had Any Thoughts About Hurting Yourself? Yes  Are You Planning to Commit Suicide/Harm Yourself At This time? No   Have you Recently Had Thoughts About Hurting Someone Sherry Bryan? No  Are You Planning to Harm Someone at This Time? No  Explanation: No data recorded  Have You Used Any Alcohol or Drugs in the Past 24 Hours? No  How Long Ago Did You Use Drugs or Alcohol? No data recorded What Did You Use and How Much? No data recorded  Do You Currently Have a Therapist/Psychiatrist? No  Name of Therapist/Psychiatrist: Patient has a Administrator, arts   Have You Been Recently Discharged From Any Public relations account executive or Programs? No  Explanation of Discharge From Practice/Program: No data recorded   CCA Screening Triage Referral Assessment Type of Contact: Face-to-Face  Telemedicine Service Delivery:   Is this Initial or Reassessment?   Date Telepsych consult ordered in [facility]:    Time Telepsych consult ordered in [facility]:    Location of Assessment: [facility] ED  Provider Location: [facility] ED    Collateral Involvement: None provided   Does Patient Have a Court Appointed Legal Guardian? No data recorded Name and Contact of Legal Guardian: No data recorded If Minor and Not Living with Parent(s), Who has Custody? No data recorded Is CPS involved or ever been involved? Never  Is APS involved or ever been involved? Never   Patient Determined To Be At Risk for Harm To Self or Others Based  on Review of Patient Reported Information or Presenting Complaint? No  Method: Plan without intent  Availability of Means: No access or NA  Intent: Vague intent or NA  Notification Required: No need or identified person  Additional Information for Danger to Others Potential: No data recorded Additional Comments for Danger to Others Potential: No data recorded Are There Guns or Other Weapons in Your Home? No  Types of Guns/Weapons: No data  recorded Are These Weapons Safely Secured?                            No data recorded Who Could Verify You Are Able To Have These Secured: No data recorded Do You Have any Outstanding Charges, Pending Court Dates, Parole/Probation? No data recorded Contacted To Inform of Risk of Harm To Self or Others: No data recorded  Does Patient Present under Involuntary Commitment? No    [place] of Residence:    Patient Currently Receiving the Following Services: Group Home   Determination of Need: Emergent (2 hours)   Options For Referral: ED Visit; Medication Management   Disposition Recommendation per psychiatric provider: Pt is recommended for overnight observation to be reassessed in the morning.   Marbin Olshefski Dierdre Searles, Counselor, LCAS-A

## 2023-01-30 DIAGNOSIS — R45851 Suicidal ideations: Secondary | ICD-10-CM | POA: Diagnosis not present

## 2023-01-30 MED ORDER — ACETAMINOPHEN 325 MG PO TABS
650.0000 mg | ORAL_TABLET | Freq: Once | ORAL | Status: AC
  Administered 2023-01-30: 650 mg via ORAL
  Filled 2023-01-30: qty 2

## 2023-01-30 NOTE — Progress Notes (Signed)
LCSW Progress Note  [id number]   Sherry Bryan  01/30/2023  11:36 AM  Description:   Inpatient Psychiatric Referral  Patient was recommended inpatient perJacqueline Nedra Hai NP . There are no available beds at [facility], per [facility] Rona Ravens RN. Patient was referred to the following out of network facilities:  Destination  Service Provider Address Phone Fax  [facility] [address], [place] [address] [phone]  [facility] [address] [place] [address] [phone]  [facility] [address] [place] [address] [phone]  [facility] [address] [place] [address] [phone]  [facility] [address] [place] [address] [phone]  [facility] [address] [place], [place] [address] [phone]  [facility] [address] [place] [address] [phone]  [facility] [address] [place] [address] [phone]  [facility] [address] [place] [address] [phone]  [facility] [address], [place] [address] [phone]  [facility] [address] [place] [address] [phone]  [facility] [address], [place] [address] [phone]  [facility] [address] [place] [address] [phone]  [facility] EFAX [address] [place], [place] [phone]  [facility] [address] [place] [address] [phone]  [facility] [address] [place] [address] [phone]  [facility] [address] [place] [address] [phone]  [facility] [address] [place] [address] [phone]      Situation ongoing, CSW to continue following and update chart as more information becomes available.      [place] Shalva Rozycki MSW, LCSW  01/30/2023 11:36 AM

## 2023-01-30 NOTE — ED Notes (Signed)
VOL/ reassess in a.m.

## 2023-01-30 NOTE — ED Notes (Signed)
This RN attempted to reach legal guardian to notify of discharge to [facility]- no answer at this time. HIPAA compliant voice message left regarding patient disposition.

## 2023-01-30 NOTE — ED Notes (Signed)
EMTALA reviewed by this RN, pt ready for transport, pt under IVC no consent obtained

## 2023-01-30 NOTE — Progress Notes (Signed)
Pt has been accepted to [facility] TODAY 01/30/2023 assignment: Main campus  Pt meets inpatient criteria per Darrick Grinder NP  Attending Physician will be Loni Beckwith, MD  Report can be called to: [phone] (this is a pager, please leave call-back number when giving report)  Pt can arrive ASAP   Care Team Notified:  Rona Ravens RN, Darrick Grinder NP, Wynonia Sours RN   [place] Alberto Pina LCSW-A   01/30/2023 12:33 PM

## 2023-01-30 NOTE — ED Notes (Signed)
Hospital meal provided, pt tolerated w/o complaints.  Waste discarded appropriately.  

## 2023-01-30 NOTE — ED Notes (Signed)
Pt given gingerale as requested.

## 2023-01-30 NOTE — ED Notes (Signed)
Received call back @1554  from [facility] not female staff available to transport until tomorrow

## 2023-01-30 NOTE — ED Notes (Signed)
Group home updated by this RN- group home speaking with patient at this time on the phone per patient request.

## 2023-01-30 NOTE — ED Provider Notes (Signed)
Psychiatry currently recommending the patient be reassessed today for further disposition planning.  Patient also has a guardian  According to note by Dr. Roxan Hockey the patient is under IVC   Vitals:   01/29/23 1138 01/29/23 1953  BP: 104/81 106/72  Pulse: 97 85  Resp: 16 18  Temp: 97.8 F (36.6 C) (!) 97.5 F (36.4 C)  SpO2: 95% 100%      Sharyn Creamer, MD 01/30/23 [phone]

## 2023-01-30 NOTE — ED Notes (Signed)
Patient belongings bag 1/1 sent with patient at this time.

## 2023-01-30 NOTE — ED Notes (Signed)
This RN spoke on phone with [facility] representative about patient potentially going to facility
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
## 2023-01-30 NOTE — ED Notes (Signed)
Waiting on [facility] to transport to [facility]

## 2023-01-30 NOTE — ED Notes (Signed)
This RN attempted to notify group home manager- voice mail full and unable to leave message at this time to notify of departure to [facility].

## 2023-01-30 NOTE — ED Notes (Signed)
Patient accepted to [facility] Dola Argyle NP Accepting MD  Dr. Loni Beckwith Phone [phone]

## 2023-02-10 ENCOUNTER — Emergency Department
Admission: EM | Admit: 2023-02-10 | Discharge: 2023-02-10 | Discharge status: Against Medical Advice | Payer: MEDICAID | Attending: Emergency Medicine | Admitting: Emergency Medicine

## 2023-02-10 DIAGNOSIS — Z5321 Procedure and treatment not carried out due to patient leaving prior to being seen by health care provider: Secondary | ICD-10-CM | POA: Diagnosis not present

## 2023-02-10 DIAGNOSIS — S60911A Unspecified superficial injury of right wrist, initial encounter: Secondary | ICD-10-CM | POA: Diagnosis present

## 2023-02-10 DIAGNOSIS — S61511A Laceration without foreign body of right wrist, initial encounter: Secondary | ICD-10-CM | POA: Insufficient documentation

## 2023-02-10 DIAGNOSIS — X838XXA Intentional self-harm by other specified means, initial encounter: Secondary | ICD-10-CM | POA: Diagnosis not present
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
## 2023-02-10 NOTE — ED Notes (Signed)
Patient not found in emergency room. Security is circling around hospital to look for patient. Still no answer from Legal guardian numbers in chart

## 2023-02-10 NOTE — ED Notes (Addendum)
Patients legal guardian contacted, Lauren Rollings, to inform that patient was brought to ER and walked out. [employer] called and taking patient to [facility] at this time. Patient refused to come to ER

## 2023-02-10 NOTE — ED Notes (Signed)
Patient denies SI/HI. Reports she is ready to check out. Patient called group home and they reported they will come get her once she is seen and discharged per patient. Patient asked to take a seat until we can get a room available   Attempted to call legal guardian with no answer

## 2023-02-10 NOTE — ED Notes (Signed)
Patient found across the street from the hospital and brought back to ER by security

## 2023-02-13 ENCOUNTER — Other Ambulatory Visit: Payer: Self-pay

## 2023-02-13 ENCOUNTER — Emergency Department
Admission: EM | Admit: 2023-02-13 | Discharge: 2023-02-18 | Disposition: A | Discharge status: Home / Self Care | Payer: MEDICAID | Attending: Student in an Organized Health Care Education/Training Program | Admitting: Student in an Organized Health Care Education/Training Program

## 2023-02-13 DIAGNOSIS — R4689 Other symptoms and signs involving appearance and behavior: Secondary | ICD-10-CM | POA: Insufficient documentation

## 2023-02-13 DIAGNOSIS — R45851 Suicidal ideations: Secondary | ICD-10-CM | POA: Insufficient documentation

## 2023-02-13 DIAGNOSIS — F3481 Disruptive mood dysregulation disorder: Secondary | ICD-10-CM | POA: Insufficient documentation

## 2023-02-13 DIAGNOSIS — F339 Major depressive disorder, recurrent, unspecified: Secondary | ICD-10-CM

## 2023-02-13 DIAGNOSIS — F603 Borderline personality disorder: Secondary | ICD-10-CM | POA: Insufficient documentation

## 2023-02-13 DIAGNOSIS — F912 Conduct disorder, adolescent-onset type: Secondary | ICD-10-CM | POA: Diagnosis not present

## 2023-02-13 DIAGNOSIS — Z1152 Encounter for screening for COVID-19: Secondary | ICD-10-CM | POA: Insufficient documentation

## 2023-02-13 DIAGNOSIS — F332 Major depressive disorder, recurrent severe without psychotic features: Secondary | ICD-10-CM | POA: Diagnosis not present

## 2023-02-13 DIAGNOSIS — F32A Depression, unspecified: Secondary | ICD-10-CM | POA: Diagnosis present

## 2023-02-13 LAB — CBC
HCT: 39.3 % (ref 36.0–46.0)
Hemoglobin: 11.5 g/dL — ABNORMAL LOW (ref 12.0–15.0)
MCH: 21.4 pg — ABNORMAL LOW (ref 26.0–34.0)
MCHC: 29.3 g/dL — ABNORMAL LOW (ref 30.0–36.0)
MCV: 73.2 fL — ABNORMAL LOW (ref 80.0–100.0)
Platelets: 293 10*3/uL (ref 150–400)
RBC: 5.37 MIL/uL — ABNORMAL HIGH (ref 3.87–5.11)
RDW: 17.9 % — ABNORMAL HIGH (ref 11.5–15.5)
WBC: 9.8 10*3/uL (ref 4.0–10.5)
nRBC: 0 % (ref 0.0–0.2)

## 2023-02-13 LAB — ACETAMINOPHEN LEVEL: Acetaminophen (Tylenol), Serum: <10 ug/mL — ABNORMAL LOW (ref 10–30)

## 2023-02-13 LAB — URINE DRUG SCREEN, QUALITATIVE (ARMC ONLY)
Amphetamines, Ur Screen: NOT DETECTED
Barbiturates, Ur Screen: NOT DETECTED
Benzodiazepine, Ur Scrn: NOT DETECTED
Cannabinoid 50 Ng, Ur ~~LOC~~: NOT DETECTED
Cocaine Metabolite,Ur ~~LOC~~: NOT DETECTED
MDMA (Ecstasy)Ur Screen: NOT DETECTED
Methadone Scn, Ur: NOT DETECTED
Opiate, Ur Screen: NOT DETECTED
Phencyclidine (PCP) Ur S: NOT DETECTED
Tricyclic, Ur Screen: NOT DETECTED

## 2023-02-13 LAB — COMPREHENSIVE METABOLIC PANEL
ALT: 19 U/L (ref 0–44)
AST: 15 U/L (ref 15–41)
Albumin: 3.3 g/dL — ABNORMAL LOW (ref 3.5–5.0)
Alkaline Phosphatase: 72 U/L (ref 38–126)
Anion gap: 12 (ref 5–15)
BUN: 14 mg/dL (ref 6–20)
CO2: 24 mmol/L (ref 22–32)
Calcium: 9 mg/dL (ref 8.9–10.3)
Chloride: 100 mmol/L (ref 98–111)
Creatinine, Ser: 0.87 mg/dL (ref 0.44–1.00)
GFR, Estimated: >60 mL/min (ref >60)
Glucose, Bld: 107 mg/dL — ABNORMAL HIGH (ref 70–99)
Potassium: 3.8 mmol/L (ref 3.5–5.1)
Sodium: 136 mmol/L (ref 135–145)
Total Bilirubin: 0.6 mg/dL (ref 0.0–1.2)
Total Protein: 7.2 g/dL (ref 6.5–8.1)

## 2023-02-13 LAB — SALICYLATE LEVEL: Salicylate Lvl: <7 mg/dL — ABNORMAL LOW (ref 7.0–30.0)

## 2023-02-13 LAB — POC URINE PREG, ED: Preg Test, Ur: NEGATIVE

## 2023-02-13 LAB — ETHANOL: Alcohol, Ethyl (B): <10 mg/dL (ref <10)

## 2023-02-13 NOTE — ED Provider Notes (Signed)
Sherry Bryan    Event Date/Time   First MD Initiated Contact with Patient 02/13/23 2041     (approximate)   History   Psychiatric Evaluation   HPI  Sherry Bryan is a 28 y.o. Sherry with history of IDD and DMDD presents from group home with suicidal ideation.  Has a history of the same.  Also documented history of depression.  States that she has a plan to cut herself which she has done in the past.     Physical Exam   Triage Vital Signs: ED Triage Vitals  Encounter Vitals Group     BP 02/13/23 1926 115/69     Systolic BP Percentile --      Diastolic BP Percentile --      Pulse Rate 02/13/23 1926 97     Resp 02/13/23 1926 17     Temp 02/13/23 1926 98.9 F (37.2 C)     Temp Source 02/13/23 1926 Oral     SpO2 02/13/23 1926 95 %     Weight 02/13/23 1936 260 lb (117.9 kg)     Height 02/13/23 1936 5\' 4"  (1.626 m)     Head Circumference --      Peak Flow --      Pain Score 02/13/23 1932 0     Pain Loc --      Pain Education --      Exclude from Growth Chart --     Most recent vital signs: Vitals:   02/13/23 1926  BP: 115/69  Pulse: 97  Resp: 17  Temp: 98.9 F (37.2 C)  SpO2: 95%     Constitutional: Alert  Eyes: Conjunctivae are normal.  Head: Atraumatic. Nose: No congestion/rhinnorhea. Mouth/Throat: Mucous membranes are moist.   Neck: Painless ROM.  Cardiovascular:   Good peripheral circulation. Respiratory: Normal respiratory effort.  No retractions.  Gastrointestinal: Soft and nontender.  Musculoskeletal:  no deformity Neurologic:  MAE spontaneously. No gross focal neurologic deficits are appreciated.  Skin:  Skin is warm, dry and intact. No rash noted. Psychiatric: calm and cooperative    ED Results / Procedures / Treatments   Labs (all labs ordered are listed, but only abnormal results are displayed) Labs Reviewed  COMPREHENSIVE METABOLIC PANEL - Abnormal; Notable for the following components:       Result Value   Glucose, Bld 107 (*)    Albumin 3.3 (*)    All other components within normal limits  SALICYLATE LEVEL - Abnormal; Notable for the following components:   Salicylate Lvl <7.0 (*)    All other components within normal limits  ACETAMINOPHEN LEVEL - Abnormal; Notable for the following components:   Acetaminophen (Tylenol), Serum <10 (*)    All other components within normal limits  CBC - Abnormal; Notable for the following components:   RBC 5.37 (*)    Hemoglobin 11.5 (*)    MCV 73.2 (*)    MCH 21.4 (*)    MCHC 29.3 (*)    RDW 17.9 (*)    All other components within normal limits  ETHANOL  URINE DRUG SCREEN, QUALITATIVE ([facility] ONLY)  POC URINE PREG, ED     EKG     RADIOLOGY    PROCEDURES:  Critical Care performed:   Procedures   MEDICATIONS ORDERED IN ED: Medications - No data to display   IMPRESSION / MDM / ASSESSMENT AND PLAN / ED COURSE  I reviewed the triage vital signs and the nursing notes.  Differential diagnosis includes, but is not limited to, Psychosis, delirium, medication effect, noncompliance, polysubstance abuse, Si, Hi, depression    Patient has psych history of MDD, IDDD, SI attempts.  Laboratory testing was ordered to evaluation for underlying electrolyte derangement or signs of underlying organic pathology to explain today's presentation.  Based on history and physical and laboratory evaluation, it appears that the patient's presentation is 2/2 underlying psychiatric disorder and will require further evaluation and management by inpatient psychiatry.  Patient here voluntary  The patient has been placed in psychiatric observation due to the need to provide a safe environment for the patient while obtaining psychiatric consultation and evaluation, as well as ongoing medical and medication management to treat the patient's condition.  The patient has not been placed under full IVC at this time.          FINAL CLINICAL IMPRESSION(S) / ED DIAGNOSES   Final diagnoses:  Suicidal ideation     Rx / DC Orders   ED Discharge Orders     None        Bryan:  This document was prepared using Dragon voice recognition software and may include unintentional dictation errors.    Willy Eddy, MD 02/13/23 2127

## 2023-02-13 NOTE — ED Triage Notes (Addendum)
Pt arrives with [employer] for CC of SI. Officer states pt ran away from group home. When asked why she ran away pt states she wanted to come here. States Bryn Gulling is legal guardian [phone]. Guardian made aware. Belongings include purple shirt and pants, brown shoes, and underwear.

## 2023-02-13 NOTE — ED Notes (Signed)
Pt. To BHU from ED ambulatory without difficulty, to room  BHU 4. Report from [facility]. Pt. Is alert and oriented, warm and dry in no distress. Pt. Denies  HI, and AVH. Pt states having SI thought of wanting to cut self. Patient contracts for [employer].  Pt. Calm and cooperative. Pt. Made aware of security cameras and Q15 minute rounds. Pt. Encouraged to let Nursing staff know of any concerns or needs.    ENVIRONMENTAL ASSESSMENT Potentially harmful objects out of patient reach: Yes.   Personal belongings secured: Yes.   Patient dressed in hospital provided attire only: Yes.   Plastic bags out of patient reach: Yes.   Patient care equipment (cords, cables, call bells, lines, and drains) shortened, removed, or accounted for: Yes.   Equipment and supplies removed from bottom of stretcher: Yes.   Potentially toxic materials out of patient reach: Yes.   Sharps container removed or out of patient reach: Yes.

## 2023-02-14 ENCOUNTER — Encounter: Payer: Self-pay | Admitting: Psychiatry

## 2023-02-14 DIAGNOSIS — F3481 Disruptive mood dysregulation disorder: Secondary | ICD-10-CM

## 2023-02-14 DIAGNOSIS — R45851 Suicidal ideations: Secondary | ICD-10-CM

## 2023-02-14 DIAGNOSIS — F332 Major depressive disorder, recurrent severe without psychotic features: Secondary | ICD-10-CM

## 2023-02-14 DIAGNOSIS — F603 Borderline personality disorder: Secondary | ICD-10-CM

## 2023-02-14 MED ORDER — DIVALPROEX SODIUM ER 500 MG PO TB24
1500.0000 mg | ORAL_TABLET | Freq: Every day | ORAL | Status: DC
  Administered 2023-02-15 – 2023-02-18 (×4): 1500 mg via ORAL
  Filled 2023-02-14: qty 3
  Filled 2023-02-14: qty 6
  Filled 2023-02-14 (×2): qty 3

## 2023-02-14 MED ORDER — CLONIDINE HCL 0.1 MG PO TABS
0.1000 mg | ORAL_TABLET | Freq: Every day | ORAL | Status: DC
Start: 2023-02-14 — End: 2023-02-19
  Administered 2023-02-14 – 2023-02-18 (×5): 0.1 mg via ORAL
  Filled 2023-02-14 (×5): qty 1

## 2023-02-14 MED ORDER — LORATADINE 10 MG PO TABS
10.0000 mg | ORAL_TABLET | Freq: Every day | ORAL | Status: DC
Start: 2023-02-15 — End: 2023-02-19
  Administered 2023-02-15 – 2023-02-18 (×4): 10 mg via ORAL
  Filled 2023-02-14 (×4): qty 1

## 2023-02-14 MED ORDER — HYDROXYZINE HCL 25 MG PO TABS: 50.0000 mg | ORAL_TABLET | Freq: Three times a day (TID) | ORAL | Status: DC | PRN

## 2023-02-14 MED ORDER — ADULT MULTIVITAMIN W/MINERALS CH
1.0000 | ORAL_TABLET | Freq: Every day | ORAL | Status: DC
  Administered 2023-02-15 – 2023-02-18 (×4): 1 via ORAL
  Filled 2023-02-14 (×4): qty 1

## 2023-02-14 MED ORDER — ACETAMINOPHEN 325 MG PO TABS
650.0000 mg | ORAL_TABLET | Freq: Once | ORAL | Status: AC
  Administered 2023-02-14: 650 mg via ORAL
  Filled 2023-02-14: qty 2

## 2023-02-14 MED ORDER — GUANFACINE HCL 1 MG PO TABS
0.5000 mg | ORAL_TABLET | Freq: Two times a day (BID) | ORAL | Status: DC
  Administered 2023-02-14 – 2023-02-18 (×8): 0.5 mg via ORAL
  Filled 2023-02-14 (×9): qty 1

## 2023-02-14 MED ORDER — SERTRALINE HCL 50 MG PO TABS
50.0000 mg | ORAL_TABLET | Freq: Every day | ORAL | Status: DC
  Administered 2023-02-14 – 2023-02-18 (×5): 50 mg via ORAL
  Filled 2023-02-14 (×5): qty 1

## 2023-02-14 MED ORDER — TOPIRAMATE 25 MG PO TABS
50.0000 mg | ORAL_TABLET | Freq: Two times a day (BID) | ORAL | Status: DC
  Administered 2023-02-14 – 2023-02-17 (×7): 50 mg via ORAL
  Administered 2023-02-18: 25 mg via ORAL
  Administered 2023-02-18: 50 mg via ORAL
  Filled 2023-02-14 (×9): qty 2

## 2023-02-14 MED ORDER — DOCUSATE SODIUM 100 MG PO CAPS
100.0000 mg | ORAL_CAPSULE | Freq: Two times a day (BID) | ORAL | Status: DC
  Administered 2023-02-14 – 2023-02-18 (×9): 100 mg via ORAL
  Filled 2023-02-14 (×9): qty 1

## 2023-02-14 MED ORDER — TRAZODONE HCL 100 MG PO TABS
300.0000 mg | ORAL_TABLET | Freq: Every day | ORAL | Status: DC
  Administered 2023-02-14 – 2023-02-18 (×5): 300 mg via ORAL
  Filled 2023-02-14 (×5): qty 3

## 2023-02-14 MED ORDER — OXCARBAZEPINE 300 MG/5ML PO SUSP
75.0000 mg | Freq: Every day | ORAL | Status: DC
Start: 2023-02-15 — End: 2023-02-19
  Administered 2023-02-15 – 2023-02-18 (×4): 78 mg via ORAL
  Filled 2023-02-14 (×8): qty 1.3

## 2023-02-14 MED ORDER — VENLAFAXINE HCL ER 75 MG PO CP24
75.0000 mg | ORAL_CAPSULE | Freq: Every day | ORAL | Status: DC
  Administered 2023-02-15 – 2023-02-18 (×4): 75 mg via ORAL
  Filled 2023-02-14 (×4): qty 1

## 2023-02-14 MED ORDER — DIVALPROEX SODIUM 250 MG PO DR TAB
750.0000 mg | DELAYED_RELEASE_TABLET | Freq: Once | ORAL | Status: AC
  Administered 2023-02-14: 750 mg via ORAL
  Filled 2023-02-14: qty 3

## 2023-02-14 MED ORDER — ONDANSETRON 4 MG PO TBDP
4.0000 mg | ORAL_TABLET | Freq: Three times a day (TID) | ORAL | Status: DC | PRN
  Administered 2023-02-14 – 2023-02-16 (×2): 4 mg via ORAL
  Filled 2023-02-14: qty 1

## 2023-02-14 MED ORDER — LEVOTHYROXINE SODIUM 25 MCG PO TABS
125.0000 ug | ORAL_TABLET | Freq: Every day | ORAL | Status: DC
  Administered 2023-02-15 – 2023-02-18 (×4): 125 ug via ORAL
  Filled 2023-02-14: qty 2
  Filled 2023-02-14: qty 3
  Filled 2023-02-14 (×2): qty 2

## 2023-02-14 MED ORDER — PANTOPRAZOLE SODIUM 40 MG PO TBEC
40.0000 mg | DELAYED_RELEASE_TABLET | Freq: Every day | ORAL | Status: DC
  Administered 2023-02-15 – 2023-02-18 (×4): 40 mg via ORAL
  Filled 2023-02-14 (×4): qty 1

## 2023-02-14 MED ORDER — FOLIC ACID 1 MG PO TABS
1.0000 mg | ORAL_TABLET | Freq: Every day | ORAL | Status: DC
  Administered 2023-02-15 – 2023-02-18 (×4): 1 mg via ORAL
  Filled 2023-02-14 (×4): qty 1

## 2023-02-14 MED ORDER — NYSTATIN 100000 UNIT/GM EX POWD
Freq: Two times a day (BID) | CUTANEOUS | Status: DC
  Filled 2023-02-14 (×2): qty 15

## 2023-02-14 MED ORDER — OXCARBAZEPINE 150 MG PO TABS: 75.0000 mg | ORAL_TABLET | Freq: Every day | ORAL | Status: DC

## 2023-02-14 MED ORDER — LITHIUM CARBONATE 300 MG PO CAPS
600.0000 mg | ORAL_CAPSULE | Freq: Two times a day (BID) | ORAL | Status: DC
  Administered 2023-02-14 – 2023-02-18 (×9): 600 mg via ORAL
  Filled 2023-02-14 (×9): qty 2

## 2023-02-14 MED ORDER — LEVETIRACETAM 750 MG PO TABS
1500.0000 mg | ORAL_TABLET | Freq: Two times a day (BID) | ORAL | Status: DC
  Administered 2023-02-14 – 2023-02-18 (×8): 1500 mg via ORAL
  Filled 2023-02-14 (×9): qty 2
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
## 2023-02-14 NOTE — BH Assessment (Signed)
 Per [facility] R.), patient to be referred out of system.  Referral information for Psychiatric Hospitalization faxed to;   [facility] [phone]- [phone]), no appropriate bed.  Sherry Bryan [phone]),   [address] [phone]),  [place] [phone], [phone], [phone] or [phone]),   [place] [phone]--- [phone])  [address] [phone]),   [place] [phone] -or- [phone]),   [facility] [phone] phone-- [phone])  Sherry Bryan [phone]),  [place] [phone])  Sherry Bryan [phone]).  [facility] [phone])

## 2023-02-14 NOTE — ED Notes (Signed)
Pt again came to nursing station requested "Can you just give me an Ativan or something" staff stated that they would contact the oncall MD to request this.

## 2023-02-14 NOTE — ED Notes (Signed)
No b'fast tray arrived, staff entered diet order, pt currently sleeping
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
## 2023-02-14 NOTE — ED Notes (Signed)
VOL per NP psych admit
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
## 2023-02-14 NOTE — BH Assessment (Signed)
 TTS spoke with patient's guardian Lettie [phone]), updated her on disposition for inpatient treatment. Requested documentation about the patient's IQ scores to help with inpatient bed placement. She stated she was going talk with her [facility] and forward a copy for the patient's chart.

## 2023-02-14 NOTE — ED Notes (Signed)
 Hospital meal provided, pt tolerated w/o complaints.  Waste discarded appropriately.
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
## 2023-02-14 NOTE — ED Notes (Signed)
Patient complaining of nausea

## 2023-02-14 NOTE — ED Notes (Signed)
Pt given PM snack at this time.  

## 2023-02-14 NOTE — ED Notes (Signed)
 Pt asked staff do I get to go inpatient, do you know where I'll be staff asked Ms Sherry Bryan is there a reason she is asking this, Ms Sherry Bryan replied that my family can't come see me at my house, its to far away but they can see me if I get close to them  staff stated they would pass on pt's concern.

## 2023-02-14 NOTE — ED Notes (Signed)
Patient complaining of pain on chest and abdominal region due to red rash.  Patient given dial soap wash cloth and towel and instructed to go to restroom and wash area very well with soap and water rinse and dry with towel.

## 2023-02-15 DIAGNOSIS — R4689 Other symptoms and signs involving appearance and behavior: Secondary | ICD-10-CM | POA: Insufficient documentation

## 2023-02-15 DIAGNOSIS — F912 Conduct disorder, adolescent-onset type: Secondary | ICD-10-CM | POA: Insufficient documentation
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
## 2023-02-15 NOTE — BH Assessment (Addendum)
 Referral information for Psychiatric Hospitalization RE-faxed to;    [facility] [phone]) No available beds   [facility] [phone]),    [facility] [phone]),    [facility] [phone] -or- [phone]),    [facility] [phone])   [facility] [phone])  [facility] [phone]),

## 2023-02-15 NOTE — ED Notes (Signed)
 Resumed care from [facility].  Pt showered and fresh scrubs given to pt.  Pt applied powder to yeast areas on body.

## 2023-02-15 NOTE — ED Notes (Signed)
 Pt was offered shower, but denied wanting one

## 2023-02-15 NOTE — ED Notes (Signed)
 Lunch provided.

## 2023-02-15 NOTE — ED Notes (Signed)
 Patient provided snack at appropriate snack time.  Pt consumed 100% of snack provided, tolerated well w/o complaints   Trash disposted of appropriately by patient.

## 2023-02-15 NOTE — ED Notes (Signed)
 Patient going back to BHU with security and NT at this time.

## 2023-02-15 NOTE — ED Notes (Signed)
 Pt. Transferred to BHU , room# 6 from main ED .Patient was screened by security before entering the unit. Received Report and Recommendations from hannah K. . Pt. Oriented to unit including Q15 minute rounding as well as locked bathroom protocol, meal / snack schedule, and  the security cameras in place for their protection. Patient is A/O x 4, warm / dry.  Showing no acute signs of distress. Staff to monitor as ordered

## 2023-02-15 NOTE — ED Provider Notes (Signed)
 Emergency Medicine Observation Re-evaluation Note  Sherry Bryan is a 28 y.o. female, seen on rounds today.  Pt initially presented to the ED for complaints of Psychiatric Evaluation  Currently, the patient is no acute distress. Pt asleep no issues per bhu nurse  Physical Exam  Blood pressure (!) 133/97, pulse (!) 118, temperature 98.7 F (37.1 C), temperature source Oral, resp. rate (!) 98, height 5' 4 (1.626 m), weight 117.9 kg, SpO2 98%.  Physical Exam General: No apparent distress Pulm: Normal WOB Psych: resting     ED Course / MDM     I have reviewed the labs performed to date as well as medications administered while in observation.  Recent changes in the last 24 hours include starting nystatin  powder.  Plan   Current plan is to continue to wait for psych plan/placement if felt warranted  Patient is not under full IVC at this time.   Ernest Ronal BRAVO, MD 02/15/23 [phone]

## 2023-02-15 NOTE — ED Notes (Signed)
 Hospital meal provided, pt vitals taken, pt returned to sleep

## 2023-02-15 NOTE — ED Notes (Signed)
VOL/Pending placement 

## 2023-02-16 LAB — RESP PANEL BY RT-PCR (RSV, FLU A&B, COVID)  RVPGX2
Influenza A by PCR: NEGATIVE
Influenza B by PCR: NEGATIVE
Resp Syncytial Virus by PCR: NEGATIVE
SARS Coronavirus 2 by RT PCR: NEGATIVE

## 2023-02-16 LAB — GROUP A STREP BY PCR: Group A Strep by PCR: NOT DETECTED

## 2023-02-16 NOTE — ED Notes (Signed)
Lunch tray and beverage provided 

## 2023-02-16 NOTE — ED Notes (Signed)
 [facility] follow-up with Tinnie Mango, patient's Legal Guardian and Supervisor of Adult Services at [facility].  [facility] originally left 2nd voicemail at [phone] but was successful with call to personal mobile [phone]).  Lauren stated she is currently in training this week in [place] and unable to access info at this time, however, gave info to patient's previous IDD Coordinator at [facility], Mark Bridges.  [facility] spoke to [employer] [phone]) who reported he would look up info and forward email to [facility]. [facility] has received the email copy of the IQ score and will submit to the ED secretary for upload.  Michial Skeen, [facility]

## 2023-02-16 NOTE — ED Notes (Signed)
Breakfast and orange juice provided 

## 2023-02-16 NOTE — ED Notes (Signed)
 Pt given water at this time

## 2023-02-16 NOTE — ED Notes (Addendum)
 Pt provided with dinner tray and ginger ale. Pt reminded to throw away trash in room when finished.

## 2023-02-16 NOTE — ED Notes (Signed)
Pt given nighttime snack. 

## 2023-02-16 NOTE — ED Notes (Signed)
 [facility] (2nd attempt) Follow-up with Ileene Armour of [facility] to obtain IQ score.  Garnetta conferenced the Care Manager, Eva [phone]), on the call.  Eva thought he had the patient's IQ score from age 28.  Turns out, the IQ score was not listed on his documentation.  Eva then stated that the Legal Sherlynn Tinnie Mango [phone]) of [facility] DSS, would have the most updated IQ score.  [facility] contacted Lauren and left a voicemail.  [facility] will follow-up.  Michial Skeen, [facility]

## 2023-02-16 NOTE — ED Notes (Signed)
 Patient was recommended for inpatient but with the IDD dx, the IQ score is needed.  [facility] spoke to Garnetta McAdams at [facility] [phone]) to request patient's documented IQ score.  She was at home and did not have access to it, but stated would be reaching out to staff and would return a call to [facility] asap.    Michial Skeen, [facility]

## 2023-02-16 NOTE — ED Notes (Signed)
 PT VOL/ Pending placement

## 2023-02-16 NOTE — ED Provider Notes (Signed)
 Emergency Medicine Observation Re-evaluation Note  Sherry Bryan is a 28 y.o. female, seen on rounds today.  Pt initially presented to the ED for complaints of Psychiatric Evaluation  Currently, the patient is resting in bed. No reported issues from nursing team.   Physical Exam  BP (!) 129/90 (BP Location: Right Arm)   Pulse (!) 112   Temp 98 F (36.7 C) (Oral)   Resp 20   Ht 5' 4 (1.626 m)   Wt 117.9 kg   SpO2 96%   BMI 44.63 kg/m  Physical Exam General: Resting in bed  ED Course / MDM   No labs last 24 hours.  Plan  Current plan is for dispo per psychiatry.    Levander Slate, MD 02/16/23 [phone]

## 2023-02-17 NOTE — ED Notes (Signed)
 Sherry Bryan continues to present at baseline.  Calm, and compliant with medications. Cont to monitor as ordered.

## 2023-02-17 NOTE — Consult Note (Signed)
 [name]  Patient Name: Sherry Bryan MRN: [id number] DOB: May 14, 1995 DATE OF Consult: 02/17/2023  PRIMARY PSYCHIATRIC DIAGNOSES  1.  MDD, Recurrent, Severe 2.  DMDD 3.  BPD 4.  IDD  RECOMMENDATIONS  Recommendations: Medication recommendations: No change Non-Medication/therapeutic recommendations: Follow up on outpatient basis, may benefit from DBT  Is inpatient psychiatric hospitalization recommended for this patient? No (Explain why): No imminent danger to self or others Is another care setting recommended for this patient? (examples may include Crisis Stabilization Unit, Residential/Recovery Treatment, ALF/SNF, Memory Care Unit)  No (Explain why): May return to group home From a psychiatric perspective, is this patient appropriate for discharge to an outpatient setting/resource or other less restrictive environment for continued care?  Yes (Explain why): No imminent danger to self or others Follow-Up Telepsychiatry C/L services: We will sign off for now. Please re-consult our service if needed for any concerning changes in the patient's condition, discharge planning, or questions. Communication: Treatment team members (and family members if applicable) who were involved in treatment/care discussions and planning, and with whom we spoke or engaged with via secure text/chat, include the following: Katie, RN ; Dr. Dicky; Kiki, Counselor; A[place]el, Cosmos; [place], [place]; [place], RN  Thank you for involving us  in the care of this patient. If you have any additional questions or concerns, please call [phone] and ask for me or the provider on-call.  TELEPSYCHIATRY ATTESTATION & CONSENT  As the provider for this telehealth consult, I attest that I verified the patient's identity using two separate identifiers, introduced myself to the patient, provided my credentials, disclosed my location, and performed this encounter via a HIPAA-compliant, real-time, face-to-face,  two-way, interactive audio and video platform and with the full consent and agreement of the patient (or guardian as applicable.)  Patient physical location: [place]. Telehealth provider physical location: home office in state of [place] .  Video start time: 1843  (Central Time) Video end time: 1858 (Central Time)  IDENTIFYING DATA  Sherry Bryan is a 28 y.o. year-old female for whom a psychiatric consultation has been ordered by the primary provider. The patient was identified using two separate identifiers.  CHIEF COMPLAINT/REASON FOR CONSULT  Depression  HISTORY OF PRESENT ILLNESS (HPI)  The patient came to the ED with c/o depression SI/P to cut herself with a razor.  Pt currently lives in a group home and reported she initially came in for suicidal thoughts and attempt.  When asked how she tried to kill herself, she reported she cut her arm but denied it was deep at all.  When asked if her intention was to die, she denied this and stated it was just to relieve emotional pain.  Pt now stating I'm just ready to go home.  When asked what has changed, she reported I learned my lesson not to call for help when I don't need it.  Discussed the different levels of help and that maybe she just needed to talk to someone at the group home about her feelings to help get her through it.  Pt reported she does have a safety plan and that it has been helpful in the past and when asked what she could do in the future, she stated look at my safety plan and talk to staff.  Pt more likely just reiterating previous suggestions but denied any current thoughts of wanting to harm herself or anyone else.  Reported she is scheduled to begin seeing a new therapist but isn't sure when it is scheduled to start.  Pt has a long hx of psychiatric involvement including previous inpatient psychiatric admission and outpatient tx.  Currently seen by psychiatry for her medications that she believes are helpful overall.   Continues to report some symptoms of depression and anhedonia but feels that individual therapy will help.    Recommend discharge back to the group home with current medication regimen and plan to restart outpatient therapy and continue with outpatient psychiatry.    PAST PSYCHIATRIC HISTORY   Otherwise as per HPI above.  PAST MEDICAL HISTORY  Past Medical History:  Diagnosis Date   Borderline personality disorder (HCC)    Constipation 05/15/2015   Depression    DMDD (disruptive mood dysregulation disorder) (HCC) 05/15/2015   History of seasonal allergies 05/15/2015   Hx of gastroesophageal reflux (GERD) 05/15/2015   Hx of seizure disorder 05/15/2015   Intellectual disability 05/22/2015   PTSD (post-traumatic stress disorder)    Seizures (HCC)    last one in 2015   Suicidal ideation      HOME MEDICATIONS  Facility Ordered Medications  Medication   ondansetron  (ZOFRAN -ODT) disintegrating tablet 4 mg   nystatin  (MYCOSTATIN /NYSTOP ) topical powder   cloNIDine  (CATAPRES ) tablet 0.1 mg   divalproex  (DEPAKOTE  ER) 24 hr tablet 1,500 mg   docusate sodium  (COLACE) capsule 100 mg   folic acid  (FOLVITE ) tablet 1 mg   guanFACINE  (TENEX ) tablet 0.5 mg   hydrOXYzine  (ATARAX ) tablet 50 mg   levETIRAcetam  (KEPPRA ) tablet 1,500 mg   levothyroxine  (SYNTHROID ) tablet 125 mcg   lithium  carbonate capsule 600 mg   loratadine  (CLARITIN ) tablet 10 mg   multivitamin with minerals tablet 1 tablet   pantoprazole  (PROTONIX ) EC tablet 40 mg   sertraline  (ZOLOFT ) tablet 50 mg   topiramate  (TOPAMAX ) tablet 50 mg   traZODone  (DESYREL ) tablet 300 mg   venlafaxine  XR (EFFEXOR -XR) 24 hr capsule 75 mg   [COMPLETED] acetaminophen  (TYLENOL ) tablet 650 mg   [COMPLETED] divalproex  (DEPAKOTE ) DR tablet 750 mg   OXcarbazepine  (TRILEPTAL ) 300 MG/5ML suspension 78 mg   PTA Medications  Medication Sig   docusate sodium  (COLACE) 100 MG capsule Take 100 mg by mouth 2 (two) times daily.    loratadine  (CLARITIN ) 10 MG tablet  Take 10 mg by mouth daily.   meclizine  (ANTIVERT ) 25 MG tablet Take 25 mg by mouth 3 (three) times daily as needed for nausea.   Multiple Vitamins-Minerals (MULTIVITAMIN ADULTS) TABS Take 1 tablet by mouth daily.   levETIRAcetam  (KEPPRA ) 500 MG tablet Take 500 mg by mouth 2 (two) times daily.   nystatin  (MYCOSTATIN /NYSTOP ) powder Apply 1 Bottle topically at bedtime as needed (RASH).   VENTOLIN  HFA 108 (90 Base) MCG/ACT inhaler Inhale 2 puffs into the lungs every 6 (six) hours as needed for wheezing or shortness of breath.   omeprazole (PRILOSEC) 40 MG capsule Take 40 mg by mouth daily.   INVEGA SUSTENNA 234 MG/1.5ML injection Inject 234 mg into the muscle once.   cloNIDine  (CATAPRES ) 0.1 MG tablet Take 0.1 mg by mouth at bedtime.   traZODone  (DESYREL ) 100 MG tablet Take 300 mg by mouth at bedtime.   levothyroxine  (SYNTHROID ) 50 MCG tablet Take 50 mcg by mouth daily before breakfast.   apixaban  (ELIQUIS ) 5 MG TABS tablet Take 5 mg by mouth 2 (two) times daily.   hydrochlorothiazide  (HYDRODIURIL ) 25 MG tablet Take 25 mg by mouth daily.   escitalopram  (LEXAPRO ) 10 MG tablet Take 10 mg by mouth daily.   guanFACINE  (TENEX ) 1 MG tablet Take 0.5 mg by mouth 2 (  two) times daily.  (Patient not taking: Reported on 02/15/2023)   OXcarbazepine  (TRILEPTAL ) 150 MG tablet Take 75 mg by mouth daily. (Patient not taking: Reported on 02/15/2023)   fluconazole (DIFLUCAN) 100 MG tablet Take 100 mg by mouth daily. (Patient not taking: Reported on 02/14/2023)   hydrOXYzine  (ATARAX ) 50 MG tablet Take 50 mg by mouth 3 (three) times daily as needed. (Patient not taking: Reported on 02/15/2023)   topiramate  (TOPAMAX ) 50 MG tablet Take 50 mg by mouth 2 (two) times daily. (Patient not taking: Reported on 02/15/2023)   folic acid  (FOLVITE ) 1 MG tablet Take 1 mg by mouth daily. (Patient not taking: Reported on 02/15/2023)   venlafaxine  XR (EFFEXOR -XR) 75 MG 24 hr capsule Take 75 mg by mouth daily with breakfast. (Patient not taking:  Reported on 02/15/2023)   sertraline  (ZOLOFT ) 50 MG tablet Take 50 mg by mouth daily. (Patient not taking: Reported on 02/15/2023)     ALLERGIES  Allergies  Allergen Reactions   Ritalin [Methylphenidate Hcl] Other (See Comments)    seizures   Abilify [Aripiprazole] Other (See Comments)    Shaking or tremors    SOCIAL & SUBSTANCE USE HISTORY  Social History   Socioeconomic History   Marital status: Single    Spouse name: Not on file   Number of children: Not on file   Years of education: Not on file   Highest education level: Not on file  Occupational History   Not on file  Tobacco Use   Smoking status: Former   Smokeless tobacco: Never  Vaping Use   Vaping status: Never Used  Substance and Sexual Activity   Alcohol use: No   Drug use: No   Sexual activity: Never  Other Topics Concern   Not on file  Social History Narrative   Not on file   Social Drivers of Health   Financial Resource Strain: Not on file  Food Insecurity: Not on file  Transportation Needs: Not on file  Physical Activity: Not on file  Stress: Not on file  Social Connections: Not on file   Social History   Tobacco Use  Smoking Status Former  Smokeless Tobacco Never   Social History   Substance and Sexual Activity  Alcohol Use No   Social History   Substance and Sexual Activity  Drug Use No    Additional pertinent information Lives in group home .  FAMILY HISTORY  History reviewed. No pertinent family history. Family Psychiatric History (if known):  Biological mother with mental illness and ETOH abuse  MENTAL STATUS EXAM (MSE)  Mental Status Exam: General Appearance: Disheveled  Orientation:  Full (Time, Place, and Person)  Memory:  Immediate;   Good Recent;   Good Remote;   Fair  Concentration:  Concentration: Good and Attention Span: Good  Recall:  Good  Attention  Good  Eye Contact:  Good  Speech:  Clear and Coherent  Language:  Good  Volume:  Normal  Mood: Euthymic   Affect:  Appropriate  Thought Process:  Coherent  Thought Content:  WDL  Suicidal Thoughts:  No  Homicidal Thoughts:  No  Judgement:  Fair  Insight:  Fair  Psychomotor Activity:  Normal  Akathisia:  No  Fund of Knowledge:  Fair    Assets:  Games Developer Social Support Transportation  Cognition:  WNL  ADL's:  Intact  AIMS (if indicated):       VITALS  Blood pressure 118/64, pulse (!) 103, temperature 98.4 F (36.9  C), temperature source Oral, resp. rate 18, height 5' 4 (1.626 m), weight 117.9 kg, SpO2 94%.  LABS  Admission on 02/13/2023  Component Date Value Ref Range Status   Sodium 02/13/2023 136  135 - 145 mmol/L Final   Potassium 02/13/2023 3.8  3.5 - 5.1 mmol/L Final   Chloride 02/13/2023 100  98 - 111 mmol/L Final   CO2 02/13/2023 24  22 - 32 mmol/L Final   Glucose, Bld 02/13/2023 107 (H)  70 - 99 mg/dL Final   Glucose reference range applies only to samples taken after fasting for at least 8 hours.   BUN 02/13/2023 14  6 - 20 mg/dL Final   Creatinine, Ser 02/13/2023 0.87  0.44 - 1.00 mg/dL Final   Calcium 97/96/7974 9.0  8.9 - 10.3 mg/dL Final   Total Protein 97/96/7974 7.2  6.5 - 8.1 g/dL Final   Albumin 97/96/7974 3.3 (L)  3.5 - 5.0 g/dL Final   AST 97/96/7974 15  15 - 41 U/L Final   ALT 02/13/2023 19  0 - 44 U/L Final   Alkaline Phosphatase 02/13/2023 72  38 - 126 U/L Final   Total Bilirubin 02/13/2023 0.6  0.0 - 1.2 mg/dL Final   GFR, Estimated 02/13/2023 >60  >60 mL/min Final   Comment: (NOTE) Calculated using the CKD-EPI Creatinine Equation (2021)    Anion gap 02/13/2023 12  5 - 15 Final   Performed at [facility], [address] [place], [place] [address]   Alcohol, Ethyl (B) 02/13/2023 <10  <10 mg/dL Final   Comment: (NOTE) Lowest detectable limit for serum alcohol is 10 mg/dL.  For medical purposes only. Performed at [facility], [address] [place], [place] [address]    Salicylate Lvl  02/13/2023 <7.0 (L)  7.0 - 30.0 mg/dL Final   Performed at [facility], [address] [place], [place] [address]   Acetaminophen  (Tylenol ), Serum 02/13/2023 <10 (L)  10 - 30 ug/mL Final   Comment: (NOTE) Therapeutic concentrations vary significantly. A range of 10-30 ug/mL  may be an effective concentration for many patients. However, some  are best treated at concentrations outside of this range. Acetaminophen  concentrations >150 ug/mL at 4 hours after ingestion  and >50 ug/mL at 12 hours after ingestion are often associated with  toxic reactions.  Performed at [facility], [address] [place], [place] [address]    WBC 02/13/2023 9.8  4.0 - 10.5 K/uL Final   RBC 02/13/2023 5.37 (H)  3.87 - 5.11 MIL/uL Final   Hemoglobin 02/13/2023 11.5 (L)  12.0 - 15.0 g/dL Final   HCT 97/96/7974 39.3  36.0 - 46.0 % Final   MCV 02/13/2023 73.2 (L)  80.0 - 100.0 fL Final   MCH 02/13/2023 21.4 (L)  26.0 - 34.0 pg Final   MCHC 02/13/2023 29.3 (L)  30.0 - 36.0 g/dL Final   RDW 97/96/7974 17.9 (H)  11.5 - 15.5 % Final   Platelets 02/13/2023 293  150 - 400 K/uL Final   nRBC 02/13/2023 0.0  0.0 - 0.2 % Final   Performed at [facility], [address] [place], [place] [address]   Tricyclic, Ur Screen 02/13/2023 NONE DETECTED  NONE DETECTED Final   Amphetamines, Ur Screen 02/13/2023 NONE DETECTED  NONE DETECTED Final   MDMA (Ecstasy)Ur Screen 02/13/2023 NONE DETECTED  NONE DETECTED Final   Cocaine Metabolite,Ur [place] 02/13/2023 NONE DETECTED  NONE DETECTED Final   Opiate, Ur Screen 02/13/2023 NONE DETECTED  NONE  DETECTED Final   Phencyclidine (PCP) Ur S 02/13/2023 NONE DETECTED  NONE DETECTED Final   Cannabinoid 50 Ng, Ur [place] 02/13/2023 NONE DETECTED  NONE DETECTED Final   Barbiturates, Ur Screen 02/13/2023 NONE DETECTED  NONE DETECTED Final   Benzodiazepine, Ur Scrn 02/13/2023 NONE DETECTED  NONE DETECTED Final   Methadone Scn, Ur 02/13/2023 NONE DETECTED  NONE DETECTED  Final   Comment: (NOTE) Tricyclics + metabolites, urine    Cutoff 1000 ng/mL Amphetamines + metabolites, urine  Cutoff 1000 ng/mL MDMA (Ecstasy), urine              Cutoff 500 ng/mL Cocaine Metabolite, urine          Cutoff 300 ng/mL Opiate + metabolites, urine        Cutoff 300 ng/mL Phencyclidine (PCP), urine         Cutoff 25 ng/mL Cannabinoid, urine                 Cutoff 50 ng/mL Barbiturates + metabolites, urine  Cutoff 200 ng/mL Benzodiazepine, urine              Cutoff 200 ng/mL Methadone, urine                   Cutoff 300 ng/mL  The urine drug screen provides only a preliminary, unconfirmed analytical test result and should not be used for non-medical purposes. Clinical consideration and professional judgment should be applied to any positive drug screen result due to possible interfering substances. A more specific alternate chemical method must be used in order to obtain a confirmed analytical result. Gas chromatography / mass spectrometry (GC/MS) is the preferred confirm                          atory method. Performed at [facility], [address] [place], [place] [address]    Preg Test, Ur 02/13/2023 NEGATIVE  NEGATIVE Final   Comment:        THE SENSITIVITY OF THIS METHODOLOGY IS >24 mIU/mL    SARS Coronavirus 2 by RT PCR 02/16/2023 NEGATIVE  NEGATIVE Final   Comment: (NOTE) SARS-CoV-2 target nucleic acids are NOT DETECTED.  The SARS-CoV-2 RNA is generally detectable in upper respiratory specimens during the acute phase of infection. The lowest concentration of SARS-CoV-2 viral copies this assay can detect is 138 copies/mL. A negative result does not preclude SARS-Cov-2 infection and should not be used as the sole basis for treatment or other patient management decisions. A negative result may occur with  improper specimen collection/handling, submission of specimen other than nasopharyngeal swab, presence of viral mutation(s) within the areas  targeted by this assay, and inadequate number of viral copies(<138 copies/mL). A negative result must be combined with clinical observations, patient history, and epidemiological information. The expected result is Negative.  Fact Sheet for Patients:  bloggercourse.com  Fact Sheet for Healthcare Providers:  seriousbroker.it  This test is no                          t yet approved or cleared by the [place]  FDA and  has been authorized for detection and/or diagnosis of SARS-CoV-2 by FDA under an Emergency Use Authorization (EUA). This EUA will remain  in effect (meaning this test can be used) for the duration of the COVID-19 declaration under Section 564(b)(1) of the Act, 21 U.S.C.section 360bbb-3(b)(1), unless the authorization is terminated  or  revoked sooner.       Influenza A by PCR 02/16/2023 NEGATIVE  NEGATIVE Final   Influenza B by PCR 02/16/2023 NEGATIVE  NEGATIVE Final   Comment: (NOTE) The Xpert Xpress SARS-CoV-2/FLU/RSV plus assay is intended as an aid in the diagnosis of influenza from Nasopharyngeal swab specimens and should not be used as a sole basis for treatment. Nasal washings and aspirates are unacceptable for Xpert Xpress SARS-CoV-2/FLU/RSV testing.  Fact Sheet for Patients: bloggercourse.com  Fact Sheet for Healthcare Providers: seriousbroker.it  This test is not yet approved or cleared by the [place]  FDA and has been authorized for detection and/or diagnosis of SARS-CoV-2 by FDA under an Emergency Use Authorization (EUA). This EUA will remain in effect (meaning this test can be used) for the duration of the COVID-19 declaration under Section 564(b)(1) of the Act, 21 U.S.C. section 360bbb-3(b)(1), unless the authorization is terminated or revoked.     Resp Syncytial Virus by PCR 02/16/2023 NEGATIVE  NEGATIVE Final   Comment: (NOTE) Fact Sheet  for Patients: bloggercourse.com  Fact Sheet for Healthcare Providers: seriousbroker.it  This test is not yet approved or cleared by the [place]  FDA and has been authorized for detection and/or diagnosis of SARS-CoV-2 by FDA under an Emergency Use Authorization (EUA). This EUA will remain in effect (meaning this test can be used) for the duration of the COVID-19 declaration under Section 564(b)(1) of the Act, 21 U.S.C. section 360bbb-3(b)(1), unless the authorization is terminated or revoked.  Performed at [facility], [address] [place], [place] [address]    Group A Strep by PCR 02/16/2023 NOT DETECTED  NOT DETECTED Final   Performed at [facility], [address] [place], [place] [address]    PSYCHIATRIC REVIEW OF SYSTEMS (ROS)  ROS: Notable for the following relevant positive findings: Review of Systems  Psychiatric/Behavioral:  Positive for depression.     Additional findings:      Musculoskeletal: No abnormal movements observed      Gait & Station: Normal      Pain Screening: Denies      Nutrition & Dental Concerns: If yes - consider referral to nutritional or dental specialist  RISK FORMULATION/ASSESSMENT  Is the patient experiencing any suicidal or homicidal ideations: No       Explain if yes:  Protective factors considered for safety management: Willing to get help, social supports  Risk factors/concerns considered for safety management:  Depression Impulsivity Aggression Unmarried  Is there a astronomer plan with the patient and treatment team to minimize risk factors and promote protective factors: Yes           Explain: Continue current medications, restart outpatient therapy Is crisis care placement or psychiatric hospitalization recommended: No     Based on my current evaluation and risk assessment, patient is determined at this time to be at:  Low risk  *RISK  ASSESSMENT Risk assessment is a dynamic process; it is possible that this patient's condition, and risk level, may change. This should be re-evaluated and managed over time as appropriate. Please re-consult psychiatric consult services if additional assistance is needed in terms of risk assessment and management. If your team decides to discharge this patient, please advise the patient how to best access emergency psychiatric services, or to call 911, if their condition worsens or they feel unsafe in any way.   Karna JINNY Pizza, NP Telepsychiatry Consult Services

## 2023-02-17 NOTE — Discharge Instructions (Addendum)
You have been seen in the Emergency Department (ED) today for a psychiatric complaint.  You have been evaluated by psychiatry and we believe you are safe to be discharged from the hospital.   ° °Please return to the ED immediately if you have ANY thoughts of hurting yourself or anyone else, so that we may help you. ° °Please avoid alcohol and drug use. ° °Follow up with your doctor and/or therapist as soon as possible regarding today's ED visit.   Please follow up any other recommendations and clinic appointments provided by the psychiatry team that saw you in the Emergency Department. ° °

## 2023-02-17 NOTE — BH Assessment (Signed)
 Writer called and left a HIPPA Compliant message with DSS Guardian Barbra Ley [phone]), requesting a return phone call.

## 2023-02-17 NOTE — ED Provider Notes (Signed)
 Emergency Medicine Observation Re-evaluation Note  Dustyn Armbrister is a 28 y.o. female, seen on rounds today.  Pt initially presented to the ED for complaints of Psychiatric Evaluation  Currently, the patient is no acute distress.pt asleep- no issues per bhu nurse   .  Physical Exam  Blood pressure 113/79, pulse 100, temperature 98.6 F (37 C), temperature source Oral, resp. rate 19, height 5' 4 (1.626 m), weight 117.9 kg, SpO2 94%.  Physical Exam General: No apparent distress Pulm: Normal WOB Psych: resting     ED Course / MDM     I have reviewed the labs performed to date as well as medications administered while in observation.  Recent changes in the last 24 hours include none   Plan   Current plan is to continue to wait for psych plan/placement if felt warranted  Patient is not under full IVC at this time.   Ernest Ronal BRAVO, MD 02/17/23 [phone]

## 2023-02-17 NOTE — ED Notes (Signed)
 This RN talked to legal guardian, Tinnie about pt being recommended for discharge by tele psych provider. She was under the impression the pt was being recommended for inpatient care due to the many ER visits in the past month. This RN relaid message to ED provider on shift and it was decided that discharge would be put off until the whole psych team could reassess pt and talk to legal guardian.   This RN called the legal guardian back and updated them on current plan of care.

## 2023-02-17 NOTE — ED Notes (Signed)
 Vol/recommend for inpatient psychiatric hospitalization when medically cleared.

## 2023-02-17 NOTE — ED Notes (Signed)
 Hospital meal provided, pt woken up by staff did not want to eat, meal left at bedside

## 2023-02-17 NOTE — ED Notes (Signed)
 This RN went to introduce self to pt. Pt requesting to know when she gets to go back to group home. This RN stated she needed to have her tele psych visit before a plan would be made.

## 2023-02-17 NOTE — ED Notes (Signed)
pt recieved snack and drink 

## 2023-02-17 NOTE — ED Provider Notes (Addendum)
 Psych NP White advises She appears psychiatrically stable at this point and is currently denying any thoughts of hurting herself or anyone else. She reported she is scheduled to begin outpatient therapy again which would be a great idea. We also discussed referring to her safety plan when feeling depressed or like hurting herself and/or going to staff who can help her to refer to her safety plan to try to get through the feelings/thoughts at the moment. Recommend discharge back to group home with current medications and plan to restart outpatient therapy.   Patient is not under IVC.    Vitals:   02/17/23 1939 02/17/23 2106  BP: 114/75 114/75  Pulse: (!) 136 (!) 109  Resp: 16   Temp: 97.7 F (36.5 C)   SpO2: 98%      Patient appears appropriate for discharge once her group home is able to pick her up.  Discharge prepared, patient plan to be discharged once back into the care of her group home.   Dicky Anes, MD 02/17/23 2157   ----------------------------------------- 10:19 PM on 02/17/2023 ----------------------------------------- Nursing able to communicate with legal guardian who has concerns about discharge and was under the impression the patient needed inpatient psychiatric care from previous conversations.  At this point we will hold on discharging the patient, will request our psychiatry and TTS team's continue to clarify disposition plan. Notifed NP Teresa Dicky Anes, MD 02/17/23 2220

## 2023-02-17 NOTE — ED Notes (Signed)
 Pt talked to psychiatry on tele cart.

## 2023-02-17 NOTE — BH Assessment (Signed)
 TTS spoke with the patient to complete an updated/reassessment. Patient denies SI/HI and AV/H. She states, "I just want to go home."

## 2023-02-17 NOTE — ED Notes (Signed)
 Hospital meal provided, pt tolerated w/o complaints.  Waste discarded appropriately.

## 2023-02-17 NOTE — ED Notes (Signed)
VOL/Pending Placement 

## 2023-02-18 DIAGNOSIS — R4689 Other symptoms and signs involving appearance and behavior: Secondary | ICD-10-CM

## 2023-02-18 DIAGNOSIS — F912 Conduct disorder, adolescent-onset type: Secondary | ICD-10-CM | POA: Diagnosis not present

## 2023-02-18 NOTE — Consult Note (Signed)
 [name]  Patient Name: Sherry Bryan MRN: [id number] DOB: 04-Jan-1996 DATE OF Consult: 02/18/2023  PRIMARY PSYCHIATRIC DIAGNOSES  1.  MDD, Recurrent, Severe 2.  DMDD 3.  BPD 4.  IDD  RECOMMENDATIONS  Recommendations: Medication recommendations: No change Non-Medication/therapeutic recommendations: Follow up on outpatient basis, may benefit from DBT  Is inpatient psychiatric hospitalization recommended for this patient? No (Explain why): No imminent danger to self or others Is another care setting recommended for this patient? (examples may include Crisis Stabilization Unit, Residential/Recovery Treatment, ALF/SNF, Memory Care Unit)  No (Explain why): May return to group home From a psychiatric perspective, is this patient appropriate for discharge to an outpatient setting/resource or other less restrictive environment for continued care?  Yes (Explain why): No imminent danger to self or others Follow-Up Telepsychiatry C/L services: We will sign off for now. Please re-consult our service if needed for any concerning changes in the patient's condition, discharge planning, or questions. Communication: Treatment team members (and family members if applicable) who were involved in treatment/care discussions and planning, and with whom we spoke or engaged with via secure text/chat, include the following: Katie, RN ; Dr. Dicky; Kiki, Counselor; [place], [place]; [place], [place]; [place], RN  Thank you for involving us  in the care of this patient. If you have any additional questions or concerns, please call [phone] and ask for me or the provider on-call.  TELEPSYCHIATRY ATTESTATION & CONSENT  As the provider for this telehealth consult, I attest that I verified the patient's identity using two separate identifiers, introduced myself to the patient, provided my credentials, disclosed my location, and performed this encounter via a HIPAA-compliant, real-time, face-to-face,  two-way, interactive audio and video platform and with the full consent and agreement of the patient (or guardian as applicable.)  Patient physical location: [place]. Telehealth provider physical location: home office in state of [place] .  Video start time: 0845 (Central Time) Video end time: 0900(Central Time)  IDENTIFYING DATA  Sherry Bryan is a 28 y.o. year-old female for whom a psychiatric consultation has been ordered by the primary provider. The patient was identified using two separate identifiers.  CHIEF COMPLAINT/REASON FOR CONSULT  Depression  HISTORY OF PRESENT ILLNESS (HPI)  The patient came to the ED with c/o depression SI/P to cut herself with a razor.  Pt currently lives in a group home and reported she initially came in for suicidal thoughts and attempt.  When asked how she tried to kill herself, she reported she cut her arm but denied it was deep at all.  When asked if her intention was to die, she denied this and stated it was just to relieve emotional pain.  Pt now stating I'm just ready to go home.  When asked what has changed, she reported I learned my lesson not to call for help when I don't need it.  Discussed the different levels of help and that maybe she just needed to talk to someone at the group home about her feelings to help get her through it.  Pt reported she does have a safety plan and that it has been helpful in the past and when asked what she could do in the future, she stated look at my safety plan and talk to staff.  Pt more likely just reiterating previous suggestions but denied any current thoughts of wanting to harm herself or anyone else.  Reported she is scheduled to begin seeing a new therapist but isn't sure when it is scheduled to start.  Pt  has a long hx of psychiatric involvement including previous inpatient psychiatric admission and outpatient tx.  Currently seen by psychiatry for her medications that she believes are helpful overall.   Continues to report some symptoms of depression and anhedonia but feels that individual therapy will help.    Recommend discharge back to the group home with current medication regimen and plan to restart outpatient therapy and continue with outpatient psychiatry.   ===================================================== 02-18-23:  as noted above  on past visit  ----recommendations for dc from ED Follow up visit today shows NO SI NO HI Waking up in AM Verbal coherent no agitation Sleeping OK Eating OK Compliant with her medications No paranoia No hallucinations or psychosis Talking with Guardian   Sandor [phone]),  She shares that when patient stopped her lithium  her condition significantly worsened.  They stopped such due to lithium  toxicity. Looking at her  last Lucillie level ---a couple days ago --it is therapeutic at 0.81 Shared our opinion that patient is stable and can be discharged from the ED Inpatient psych is not warranted Guardian concurs Upon DC from ED please continue to monitor lithium  level on monthly basis No other emergent changes needed       PAST PSYCHIATRIC HISTORY   Otherwise as per HPI above.  PAST MEDICAL HISTORY  Past Medical History:  Diagnosis Date   Borderline personality disorder (HCC)    Constipation 05/15/2015   Depression    DMDD (disruptive mood dysregulation disorder) (HCC) 05/15/2015   History of seasonal allergies 05/15/2015   Hx of gastroesophageal reflux (GERD) 05/15/2015   Hx of seizure disorder 05/15/2015   Intellectual disability 05/22/2015   PTSD (post-traumatic stress disorder)    Seizures (HCC)    last one in 2015   Suicidal ideation      HOME MEDICATIONS  Facility Ordered Medications  Medication   ondansetron  (ZOFRAN -ODT) disintegrating tablet 4 mg   nystatin  (MYCOSTATIN /NYSTOP ) topical powder   cloNIDine  (CATAPRES ) tablet 0.1 mg   divalproex  (DEPAKOTE  ER) 24 hr tablet 1,500 mg   docusate sodium  (COLACE) capsule 100 mg   folic acid   (FOLVITE ) tablet 1 mg   guanFACINE  (TENEX ) tablet 0.5 mg   hydrOXYzine  (ATARAX ) tablet 50 mg   levETIRAcetam  (KEPPRA ) tablet 1,500 mg   levothyroxine  (SYNTHROID ) tablet 125 mcg   lithium  carbonate capsule 600 mg   loratadine  (CLARITIN ) tablet 10 mg   multivitamin with minerals tablet 1 tablet   pantoprazole  (PROTONIX ) EC tablet 40 mg   sertraline  (ZOLOFT ) tablet 50 mg   topiramate  (TOPAMAX ) tablet 50 mg   traZODone  (DESYREL ) tablet 300 mg   venlafaxine  XR (EFFEXOR -XR) 24 hr capsule 75 mg   [COMPLETED] acetaminophen  (TYLENOL ) tablet 650 mg   [COMPLETED] divalproex  (DEPAKOTE ) DR tablet 750 mg   OXcarbazepine  (TRILEPTAL ) 300 MG/5ML suspension 78 mg   PTA Medications  Medication Sig   docusate sodium  (COLACE) 100 MG capsule Take 100 mg by mouth 2 (two) times daily.    loratadine  (CLARITIN ) 10 MG tablet Take 10 mg by mouth daily.   meclizine  (ANTIVERT ) 25 MG tablet Take 25 mg by mouth 3 (three) times daily as needed for nausea.   Multiple Vitamins-Minerals (MULTIVITAMIN ADULTS) TABS Take 1 tablet by mouth daily.   levETIRAcetam  (KEPPRA ) 500 MG tablet Take 500 mg by mouth 2 (two) times daily.   nystatin  (MYCOSTATIN /NYSTOP ) powder Apply 1 Bottle topically at bedtime as needed (RASH).   VENTOLIN  HFA 108 (90 Base) MCG/ACT inhaler Inhale 2 puffs into the lungs every 6 (six) hours  as needed for wheezing or shortness of breath.   omeprazole (PRILOSEC) 40 MG capsule Take 40 mg by mouth daily.   INVEGA SUSTENNA 234 MG/1.5ML injection Inject 234 mg into the muscle once.   cloNIDine  (CATAPRES ) 0.1 MG tablet Take 0.1 mg by mouth at bedtime.   traZODone  (DESYREL ) 100 MG tablet Take 300 mg by mouth at bedtime.   levothyroxine  (SYNTHROID ) 50 MCG tablet Take 50 mcg by mouth daily before breakfast.   apixaban  (ELIQUIS ) 5 MG TABS tablet Take 5 mg by mouth 2 (two) times daily.   hydrochlorothiazide  (HYDRODIURIL ) 25 MG tablet Take 25 mg by mouth daily.   escitalopram  (LEXAPRO ) 10 MG tablet Take 10 mg by  mouth daily.   guanFACINE  (TENEX ) 1 MG tablet Take 0.5 mg by mouth 2 (two) times daily.  (Patient not taking: Reported on 02/15/2023)   OXcarbazepine  (TRILEPTAL ) 150 MG tablet Take 75 mg by mouth daily. (Patient not taking: Reported on 02/15/2023)   fluconazole (DIFLUCAN) 100 MG tablet Take 100 mg by mouth daily. (Patient not taking: Reported on 02/14/2023)   hydrOXYzine  (ATARAX ) 50 MG tablet Take 50 mg by mouth 3 (three) times daily as needed. (Patient not taking: Reported on 02/15/2023)   topiramate  (TOPAMAX ) 50 MG tablet Take 50 mg by mouth 2 (two) times daily. (Patient not taking: Reported on 02/15/2023)   folic acid  (FOLVITE ) 1 MG tablet Take 1 mg by mouth daily. (Patient not taking: Reported on 02/15/2023)   venlafaxine  XR (EFFEXOR -XR) 75 MG 24 hr capsule Take 75 mg by mouth daily with breakfast. (Patient not taking: Reported on 02/15/2023)   sertraline  (ZOLOFT ) 50 MG tablet Take 50 mg by mouth daily. (Patient not taking: Reported on 02/15/2023)     ALLERGIES  Allergies  Allergen Reactions   Ritalin [Methylphenidate Hcl] Other (See Comments)    seizures   Abilify [Aripiprazole] Other (See Comments)    Shaking or tremors    SOCIAL & SUBSTANCE USE HISTORY  Social History   Socioeconomic History   Marital status: Single    Spouse name: Not on file   Number of children: Not on file   Years of education: Not on file   Highest education level: Not on file  Occupational History   Not on file  Tobacco Use   Smoking status: Former   Smokeless tobacco: Never  Vaping Use   Vaping status: Never Used  Substance and Sexual Activity   Alcohol use: No   Drug use: No   Sexual activity: Never  Other Topics Concern   Not on file  Social History Narrative   Not on file   Social Drivers of Health   Financial Resource Strain: Not on file  Food Insecurity: Not on file  Transportation Needs: Not on file  Physical Activity: Not on file  Stress: Not on file  Social Connections: Not on file   Social  History   Tobacco Use  Smoking Status Former  Smokeless Tobacco Never   Social History   Substance and Sexual Activity  Alcohol Use No   Social History   Substance and Sexual Activity  Drug Use No    Additional pertinent information Lives in group home .  FAMILY HISTORY  History reviewed. No pertinent family history. Family Psychiatric History (if known):  Biological mother with mental illness and ETOH abuse  MENTAL STATUS EXAM (MSE)  Mental Status Exam: General Appearance: Disheveled  Orientation:  Full (Time, Place, and Person)  Memory:  Immediate;   Good Recent;  Good Remote;   Fair  Concentration:  Concentration: Good and Attention Span: Good  Recall:  Good  Attention  Good  Eye Contact:  Good  Speech:  Clear and Coherent  Language:  Good  Volume:  Normal  Mood: Euthymic  Affect:  Appropriate  Thought Process:  Coherent  Thought Content:  WDL  Suicidal Thoughts:  No  Homicidal Thoughts:  No  Judgement:  Fair  Insight:  Fair  Psychomotor Activity:  Normal  Akathisia:  No  Fund of Knowledge:  Fair    Assets:  Games Developer Social Support Transportation  Cognition:  WNL  ADL's:  Intact  AIMS (if indicated):       VITALS  Blood pressure 114/75, pulse (!) 109, temperature 97.7 F (36.5 C), temperature source Oral, resp. rate 16, height 5' 4 (1.626 m), weight 117.9 kg, SpO2 98%.  LABS  Admission on 02/13/2023  Component Date Value Ref Range Status   Sodium 02/13/2023 136  135 - 145 mmol/L Final   Potassium 02/13/2023 3.8  3.5 - 5.1 mmol/L Final   Chloride 02/13/2023 100  98 - 111 mmol/L Final   CO2 02/13/2023 24  22 - 32 mmol/L Final   Glucose, Bld 02/13/2023 107 (H)  70 - 99 mg/dL Final   Glucose reference range applies only to samples taken after fasting for at least 8 hours.   BUN 02/13/2023 14  6 - 20 mg/dL Final   Creatinine, Ser 02/13/2023 0.87  0.44 - 1.00 mg/dL Final   Calcium 97/96/7974 9.0  8.9 - 10.3 mg/dL  Final   Total Protein 02/13/2023 7.2  6.5 - 8.1 g/dL Final   Albumin 97/96/7974 3.3 (L)  3.5 - 5.0 g/dL Final   AST 97/96/7974 15  15 - 41 U/L Final   ALT 02/13/2023 19  0 - 44 U/L Final   Alkaline Phosphatase 02/13/2023 72  38 - 126 U/L Final   Total Bilirubin 02/13/2023 0.6  0.0 - 1.2 mg/dL Final   GFR, Estimated 02/13/2023 >60  >60 mL/min Final   Comment: (NOTE) Calculated using the CKD-EPI Creatinine Equation (2021)    Anion gap 02/13/2023 12  5 - 15 Final   Performed at [facility], [address] [place], [place] [address]   Alcohol, Ethyl (B) 02/13/2023 <10  <10 mg/dL Final   Comment: (NOTE) Lowest detectable limit for serum alcohol is 10 mg/dL.  For medical purposes only. Performed at [facility], [address] [place], [place] [address]    Salicylate Lvl 02/13/2023 <7.0 (L)  7.0 - 30.0 mg/dL Final   Performed at [facility], [address] [place], [place] [address]   Acetaminophen  (Tylenol ), Serum 02/13/2023 <10 (L)  10 - 30 ug/mL Final   Comment: (NOTE) Therapeutic concentrations vary significantly. A range of 10-30 ug/mL  may be an effective concentration for many patients. However, some  are best treated at concentrations outside of this range. Acetaminophen  concentrations >150 ug/mL at 4 hours after ingestion  and >50 ug/mL at 12 hours after ingestion are often associated with  toxic reactions.  Performed at [facility], [address] [place], [place] [address]    WBC 02/13/2023 9.8  4.0 - 10.5 K/uL Final   RBC 02/13/2023 5.37 (H)  3.87 - 5.11 MIL/uL Final   Hemoglobin 02/13/2023 11.5 (L)  12.0 - 15.0 g/dL Final   HCT 97/96/7974 39.3  36.0 - 46.0 % Final   MCV 02/13/2023 73.2 (L)  80.0 -  100.0 fL Final   MCH 02/13/2023 21.4 (L)  26.0 - 34.0 pg Final   MCHC 02/13/2023 29.3 (L)  30.0 - 36.0 g/dL Final   RDW 97/96/7974 17.9 (H)  11.5 - 15.5 % Final   Platelets 02/13/2023 293  150 - 400 K/uL Final   nRBC  02/13/2023 0.0  0.0 - 0.2 % Final   Performed at [facility], [address] [place], [place] [address]   Tricyclic, Ur Screen 02/13/2023 NONE DETECTED  NONE DETECTED Final   Amphetamines, Ur Screen 02/13/2023 NONE DETECTED  NONE DETECTED Final   MDMA (Ecstasy)Ur Screen 02/13/2023 NONE DETECTED  NONE DETECTED Final   Cocaine Metabolite,Ur [place] 02/13/2023 NONE DETECTED  NONE DETECTED Final   Opiate, Ur Screen 02/13/2023 NONE DETECTED  NONE DETECTED Final   Phencyclidine (PCP) Ur S 02/13/2023 NONE DETECTED  NONE DETECTED Final   Cannabinoid 50 Ng, Ur [place] 02/13/2023 NONE DETECTED  NONE DETECTED Final   Barbiturates, Ur Screen 02/13/2023 NONE DETECTED  NONE DETECTED Final   Benzodiazepine, Ur Scrn 02/13/2023 NONE DETECTED  NONE DETECTED Final   Methadone Scn, Ur 02/13/2023 NONE DETECTED  NONE DETECTED Final   Comment: (NOTE) Tricyclics + metabolites, urine    Cutoff 1000 ng/mL Amphetamines + metabolites, urine  Cutoff 1000 ng/mL MDMA (Ecstasy), urine              Cutoff 500 ng/mL Cocaine Metabolite, urine          Cutoff 300 ng/mL Opiate + metabolites, urine        Cutoff 300 ng/mL Phencyclidine (PCP), urine         Cutoff 25 ng/mL Cannabinoid, urine                 Cutoff 50 ng/mL Barbiturates + metabolites, urine  Cutoff 200 ng/mL Benzodiazepine, urine              Cutoff 200 ng/mL Methadone, urine                   Cutoff 300 ng/mL  The urine drug screen provides only a preliminary, unconfirmed analytical test result and should not be used for non-medical purposes. Clinical consideration and professional judgment should be applied to any positive drug screen result due to possible interfering substances. A more specific alternate chemical method must be used in order to obtain a confirmed analytical result. Gas chromatography / mass spectrometry (GC/MS) is the preferred confirm                          atory method. Performed at [facility], [address]  [place], [place] [address]    Preg Test, Ur 02/13/2023 NEGATIVE  NEGATIVE Final   Comment:        THE SENSITIVITY OF THIS METHODOLOGY IS >24 mIU/mL    SARS Coronavirus 2 by RT PCR 02/16/2023 NEGATIVE  NEGATIVE Final   Comment: (NOTE) SARS-CoV-2 target nucleic acids are NOT DETECTED.  The SARS-CoV-2 RNA is generally detectable in upper respiratory specimens during the acute phase of infection. The lowest concentration of SARS-CoV-2 viral copies this assay can detect is 138 copies/mL. A negative result does not preclude SARS-Cov-2 infection and should not be used as the sole basis for treatment or other patient management decisions. A negative result may occur with  improper specimen collection/handling, submission of specimen other than nasopharyngeal swab, presence of viral mutation(s) within the areas targeted by this assay, and inadequate number  of viral copies(<138 copies/mL). A negative result must be combined with clinical observations, patient history, and epidemiological information. The expected result is Negative.  Fact Sheet for Patients:  bloggercourse.com  Fact Sheet for Healthcare Providers:  seriousbroker.it  This test is no                          t yet approved or cleared by the [place]  FDA and  has been authorized for detection and/or diagnosis of SARS-CoV-2 by FDA under an Emergency Use Authorization (EUA). This EUA will remain  in effect (meaning this test can be used) for the duration of the COVID-19 declaration under Section 564(b)(1) of the Act, 21 U.S.C.section 360bbb-3(b)(1), unless the authorization is terminated  or revoked sooner.       Influenza A by PCR 02/16/2023 NEGATIVE  NEGATIVE Final   Influenza B by PCR 02/16/2023 NEGATIVE  NEGATIVE Final   Comment: (NOTE) The Xpert Xpress SARS-CoV-2/FLU/RSV plus assay is intended as an aid in the diagnosis of influenza from Nasopharyngeal swab specimens  and should not be used as a sole basis for treatment. Nasal washings and aspirates are unacceptable for Xpert Xpress SARS-CoV-2/FLU/RSV testing.  Fact Sheet for Patients: bloggercourse.com  Fact Sheet for Healthcare Providers: seriousbroker.it  This test is not yet approved or cleared by the [place]  FDA and has been authorized for detection and/or diagnosis of SARS-CoV-2 by FDA under an Emergency Use Authorization (EUA). This EUA will remain in effect (meaning this test can be used) for the duration of the COVID-19 declaration under Section 564(b)(1) of the Act, 21 U.S.C. section 360bbb-3(b)(1), unless the authorization is terminated or revoked.     Resp Syncytial Virus by PCR 02/16/2023 NEGATIVE  NEGATIVE Final   Comment: (NOTE) Fact Sheet for Patients: bloggercourse.com  Fact Sheet for Healthcare Providers: seriousbroker.it  This test is not yet approved or cleared by the [place]  FDA and has been authorized for detection and/or diagnosis of SARS-CoV-2 by FDA under an Emergency Use Authorization (EUA). This EUA will remain in effect (meaning this test can be used) for the duration of the COVID-19 declaration under Section 564(b)(1) of the Act, 21 U.S.C. section 360bbb-3(b)(1), unless the authorization is terminated or revoked.  Performed at [facility], [address] [place], [place] [address]    Group A Strep by PCR 02/16/2023 NOT DETECTED  NOT DETECTED Final   Performed at [facility], [address] [place], [place] [address]    PSYCHIATRIC REVIEW OF SYSTEMS (ROS)  ROS: Notable for the following relevant positive findings: Review of Systems  Psychiatric/Behavioral:  Positive for depression.     Additional findings:      Musculoskeletal: No abnormal movements observed      Gait & Station: Normal      Pain Screening: Denies       Nutrition & Dental Concerns: If yes - consider referral to nutritional or dental specialist  RISK FORMULATION/ASSESSMENT  Is the patient experiencing any suicidal or homicidal ideations: No       Explain if yes:  Protective factors considered for safety management: Willing to get help, social supports  Risk factors/concerns considered for safety management:  Depression Impulsivity Aggression Unmarried  Is there a astronomer plan with the patient and treatment team to minimize risk factors and promote protective factors: Yes           Explain: Continue current medications, restart outpatient therapy Is crisis care placement or  psychiatric hospitalization recommended: No     Based on my current evaluation and risk assessment, patient is determined at this time to be at:  Low risk  *RISK ASSESSMENT Risk assessment is a dynamic process; it is possible that this patient's condition, and risk level, may change. This should be re-evaluated and managed over time as appropriate. Please re-consult psychiatric consult services if additional assistance is needed in terms of risk assessment and management. If your team decides to discharge this patient, please advise the patient how to best access emergency psychiatric services, or to call 911, if their condition worsens or they feel unsafe in any way.   Laurence Hercules JAYSON Delene, MD Telepsychiatry Consult Services

## 2023-02-18 NOTE — ED Notes (Signed)
 Group home called back and Sherry Bryan stated she would send someone for the patient

## 2023-02-18 NOTE — ED Notes (Signed)
 Lunch tray provided.

## 2023-02-18 NOTE — ED Provider Notes (Addendum)
 Emergency Medicine Observation Re-evaluation Note  Sherry Bryan is a 28 y.o. female, seen on rounds today.  Pt initially presented to the ED for complaints of Psychiatric Evaluation  Currently, the patient is resting comfortably.  Physical Exam  BP 114/75   Pulse (!) 109   Temp 97.7 F (36.5 C) (Oral)   Resp 16   Ht 5' 4 (1.626 m)   Wt 117.9 kg   SpO2 98%   BMI 44.63 kg/m  General: No acute distress Cardiac: Well-perfused extremities Lungs: No respiratory distress Psych: Appropriate mood and affect  ED Course / MDM  EKG:EKG Interpretation Date/Time:  Wednesday February 15 2023 14:32:44 EST Ventricular Rate:  113 PR Interval:  204 QRS Duration:  58 QT Interval:  298 QTC Calculation: 408 R Axis:   28  Text Interpretation: Sinus tachycardia Low voltage QRS Nonspecific T wave abnormality Abnormal ECG When compared with ECG of 19-Dec-2016 11:58, PREVIOUS ECG IS PRESENT Confirmed by UNCONFIRMED, DOCTOR ([id number]), editor Alex Slough ([id number]) on 02/16/2023 7:06:52 AM  I have reviewed the labs performed to date as well as medications administered while in observation.  Recent changes in the last 24 hours include none.  Plan  Current plan is for reevaluation by telepsychiatry given that guardian feels uncomfortable taking patient home and she was not contacted by telepsychiatry yesterday.  Patient was seen by Dr. Laurence Ram Heh this morning who has spoken to patient's guardian and agrees with plan for discharge home and outpatient follow-up to monitor lithium  levels   Dereona Kolodny K, MD 02/18/23 1013    Maureen Delatte K, MD 02/18/23 1040

## 2023-02-18 NOTE — ED Notes (Signed)
 Breakfast tray provided.

## 2023-02-18 NOTE — ED Notes (Signed)
 Attempted to call guardian, no answer

## 2023-02-18 NOTE — ED Notes (Signed)
 Attempted to call [facility], no answer

## 2023-02-18 NOTE — ED Notes (Signed)
 Spoke with Latoya from patient's group home.  She stated that someone should be at the ED shortly to pick up patient.

## 2023-02-18 NOTE — ED Notes (Signed)
VOL/Pending placement 

## 2023-02-18 NOTE — ED Notes (Signed)
 Spoke to group home provider, [name], who states that pt was not taking Lithium  when she left the group home to come to ED.  Ms. Chancy states that she needs all medications that pt should be taking verified before she will agree to pick up pt.  States that this has happened before the pt was taking something different at hospital and when she returned home her behaviors changed.  Providers notified of same.

## 2023-02-18 NOTE — ED Notes (Signed)
 Spoke with guardian Barbra Ley who states that Latoya from [facility] would be person to pick up pt.  She states that she has left a VM and text message with her.  This RN also left VM with Latoya to notify her of pt discharge.

## 2023-02-18 NOTE — ED Notes (Signed)
Vol /pending placement 

## 2023-03-17 ENCOUNTER — Other Ambulatory Visit: Payer: Self-pay

## 2023-03-17 ENCOUNTER — Emergency Department
Admission: EM | Admit: 2023-03-17 | Discharge: 2023-03-18 | Disposition: A | Discharge status: Other Acute Inpatient Hospital | Payer: MEDICAID | Attending: Emergency Medicine | Admitting: Emergency Medicine

## 2023-03-17 DIAGNOSIS — R45851 Suicidal ideations: Secondary | ICD-10-CM | POA: Diagnosis not present

## 2023-03-17 DIAGNOSIS — F332 Major depressive disorder, recurrent severe without psychotic features: Secondary | ICD-10-CM | POA: Diagnosis present

## 2023-03-17 DIAGNOSIS — F3481 Disruptive mood dysregulation disorder: Secondary | ICD-10-CM | POA: Diagnosis present

## 2023-03-17 DIAGNOSIS — F603 Borderline personality disorder: Secondary | ICD-10-CM | POA: Diagnosis present

## 2023-03-17 DIAGNOSIS — R4689 Other symptoms and signs involving appearance and behavior: Secondary | ICD-10-CM | POA: Diagnosis present

## 2023-03-17 DIAGNOSIS — F79 Unspecified intellectual disabilities: Secondary | ICD-10-CM

## 2023-03-17 DIAGNOSIS — Z8669 Personal history of other diseases of the nervous system and sense organs: Secondary | ICD-10-CM

## 2023-03-17 DIAGNOSIS — F32A Depression, unspecified: Secondary | ICD-10-CM | POA: Diagnosis present

## 2023-03-17 LAB — CBC
HCT: 34.3 % — ABNORMAL LOW (ref 36.0–46.0)
Hemoglobin: 10 g/dL — ABNORMAL LOW (ref 12.0–15.0)
MCH: 22.3 pg — ABNORMAL LOW (ref 26.0–34.0)
MCHC: 29.2 g/dL — ABNORMAL LOW (ref 30.0–36.0)
MCV: 76.6 fL — ABNORMAL LOW (ref 80.0–100.0)
Platelets: 221 10*3/uL (ref 150–400)
RBC: 4.48 MIL/uL (ref 3.87–5.11)
RDW: 21.2 % — ABNORMAL HIGH (ref 11.5–15.5)
WBC: 8.3 10*3/uL (ref 4.0–10.5)
nRBC: 0 % (ref 0.0–0.2)

## 2023-03-17 LAB — COMPREHENSIVE METABOLIC PANEL
ALT: 18 U/L (ref 0–44)
AST: 21 U/L (ref 15–41)
Albumin: 3.1 g/dL — ABNORMAL LOW (ref 3.5–5.0)
Alkaline Phosphatase: 58 U/L (ref 38–126)
Anion gap: 10 (ref 5–15)
BUN: 8 mg/dL (ref 6–20)
CO2: 24 mmol/L (ref 22–32)
Calcium: 9.3 mg/dL (ref 8.9–10.3)
Chloride: 102 mmol/L (ref 98–111)
Creatinine, Ser: 0.82 mg/dL (ref 0.44–1.00)
GFR, Estimated: >60 mL/min (ref >60)
Glucose, Bld: 117 mg/dL — ABNORMAL HIGH (ref 70–99)
Potassium: 3.8 mmol/L (ref 3.5–5.1)
Sodium: 136 mmol/L (ref 135–145)
Total Bilirubin: 0.5 mg/dL (ref 0.0–1.2)
Total Protein: 6.6 g/dL (ref 6.5–8.1)

## 2023-03-17 LAB — SALICYLATE LEVEL: Salicylate Lvl: <7 mg/dL — ABNORMAL LOW (ref 7.0–30.0)

## 2023-03-17 LAB — ACETAMINOPHEN LEVEL: Acetaminophen (Tylenol), Serum: <10 ug/mL — ABNORMAL LOW (ref 10–30)

## 2023-03-17 LAB — ETHANOL: Alcohol, Ethyl (B): <10 mg/dL (ref <10)

## 2023-03-17 NOTE — ED Notes (Signed)
pt recieved snack and drink 

## 2023-03-17 NOTE — ED Provider Notes (Signed)
 [facility] Provider Note    Event Date/Time   First MD Initiated Contact with Patient 03/17/23 2051     (approximate)   History   Psychiatric Evaluation   HPI Sherry Bryan is a 28 y.o. female with history of seizures, intellectual disability, MDD presenting today for SI.  Patient states for the past several days she has had worsening suicidal thoughts.  She has thoughts of cutting herself with glass.  She reports she did try this 1 time on her right wrist but no injury.  She also feels like she is hearing her mother's voice.  She states she just feels lonely without her mother which is brought on these thoughts.  Otherwise denies any tobacco or drug use.  No other acute medical complaints.     Physical Exam   Triage Vital Signs: ED Triage Vitals  Encounter Vitals Group     BP 03/17/23 2036 130/85     Systolic BP Percentile --      Diastolic BP Percentile --      Pulse Rate 03/17/23 2036 93     Resp 03/17/23 2036 16     Temp 03/17/23 2036 98 F (36.7 C)     Temp Source 03/17/23 2036 Oral     SpO2 03/17/23 2036 95 %     Weight 03/17/23 2037 (!) 335 lb (152 kg)     Height 03/17/23 2037 5\' 4"  (1.626 m)     Head Circumference --      Peak Flow --      Pain Score 03/17/23 2037 0     Pain Loc --      Pain Education --      Exclude from Growth Chart --     Most recent vital signs: Vitals:   03/17/23 2036  BP: 130/85  Pulse: 93  Resp: 16  Temp: 98 F (36.7 C)  SpO2: 95%   I have reviewed the vital signs. General:  Awake, alert, no acute distress. Head:  Normocephalic, Atraumatic. EENT:  PERRL, EOMI, Oral mucosa pink and moist, Neck is supple. Cardiovascular: Regular rate, 2+ distal pulses. Respiratory:  Normal respiratory effort, symmetrical expansion, no distress.   Extremities:  Moving all four extremities through full ROM without pain.   Neuro:  Alert and oriented.  Interacting appropriately.   Skin:  Warm, dry, no rash.   Psych:  Appropriate affect.    ED Results / Procedures / Treatments   Labs (all labs ordered are listed, but only abnormal results are displayed) Labs Reviewed  COMPREHENSIVE METABOLIC PANEL - Abnormal; Notable for the following components:      Result Value   Glucose, Bld 117 (*)    Albumin 3.1 (*)    All other components within normal limits  SALICYLATE LEVEL - Abnormal; Notable for the following components:   Salicylate Lvl <7.0 (*)    All other components within normal limits  ACETAMINOPHEN LEVEL - Abnormal; Notable for the following components:   Acetaminophen (Tylenol), Serum <10 (*)    All other components within normal limits  CBC - Abnormal; Notable for the following components:   Hemoglobin 10.0 (*)    HCT 34.3 (*)    MCV 76.6 (*)    MCH 22.3 (*)    MCHC 29.2 (*)    RDW 21.2 (*)    All other components within normal limits  ETHANOL  URINE DRUG SCREEN, QUALITATIVE ([facility] ONLY)  POC URINE PREG, ED     EKG  RADIOLOGY    PROCEDURES:  Critical Care performed: No  Procedures   MEDICATIONS ORDERED IN ED: Medications - No data to display   IMPRESSION / MDM / ASSESSMENT AND PLAN / ED COURSE  I reviewed the triage vital signs and the nursing notes.                              Differential diagnosis includes, but is not limited to, worsening depression, SI  Patient's presentation is most consistent with acute presentation with potential threat to life or bodily function.  Patient is a 28 year old female presenting today for suicidal ideation and worsening depression.  No acute injuries seen on exam.  Physical exam and vital signs otherwise stable.  Medically cleared and psychiatry consulted for further evaluation.  Patient is here voluntarily.  Patient signed out to oncoming provider pending psychiatric evaluation.  The patient has been placed in psychiatric observation due to the need to provide a safe environment for the patient while obtaining psychiatric  consultation and evaluation, as well as ongoing medical and medication management to treat the patient's condition.  The patient has not been placed under full IVC at this time. Clinical Course as of 03/17/23 2335  Fri Mar 17, 2023  2335 Psych recommends inpatient admission [DW]    Clinical Course User Index [DW] Janith Lima, MD     FINAL CLINICAL IMPRESSION(S) / ED DIAGNOSES   Final diagnoses:  Suicidal ideation     Rx / DC Orders   ED Discharge Orders     None        Note:  This document was prepared using Dragon voice recognition software and may include unintentional dictation errors.   Janith Lima, MD 03/17/23 2216
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
## 2023-03-17 NOTE — ED Notes (Signed)
 Vol /psych consult pending /moved to [facility]

## 2023-03-17 NOTE — ED Triage Notes (Signed)
 Pt reports she arrived via [employer] for [employer]. Pt reports she feels like cutting herself with glass. Pt reports she is hearing her mother's voice and seeing her shadows. Pt is calm in triage.

## 2023-03-17 NOTE — ED Notes (Signed)
 Patient moved to B[facility] from main ED.  Oriented to unit regarding rounds and cameras.

## 2023-03-17 NOTE — ED Notes (Signed)
 Pt belongings in plastic belongings bag: Blue top, grey sweat pants, brown sandals, floral rose coat.
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
## 2023-03-18 DIAGNOSIS — R45851 Suicidal ideations: Secondary | ICD-10-CM

## 2023-03-18 DIAGNOSIS — F333 Major depressive disorder, recurrent, severe with psychotic symptoms: Secondary | ICD-10-CM

## 2023-03-18 LAB — URINE DRUG SCREEN, QUALITATIVE (ARMC ONLY)
Amphetamines, Ur Screen: NOT DETECTED
Barbiturates, Ur Screen: NOT DETECTED
Benzodiazepine, Ur Scrn: NOT DETECTED
Cannabinoid 50 Ng, Ur ~~LOC~~: NOT DETECTED
Cocaine Metabolite,Ur ~~LOC~~: NOT DETECTED
MDMA (Ecstasy)Ur Screen: NOT DETECTED
Methadone Scn, Ur: NOT DETECTED
Opiate, Ur Screen: NOT DETECTED
Phencyclidine (PCP) Ur S: NOT DETECTED
Tricyclic, Ur Screen: NOT DETECTED

## 2023-03-18 MED ORDER — LORATADINE 10 MG PO TABS: 10.0000 mg | ORAL_TABLET | Freq: Every day | ORAL | Status: DC

## 2023-03-18 MED ORDER — FLUTICASONE PROPIONATE 50 MCG/ACT NA SUSP: 1.0000 | Freq: Two times a day (BID) | NASAL | Status: DC | PRN

## 2023-03-18 MED ORDER — TRAZODONE HCL 100 MG PO TABS: 300.0000 mg | ORAL_TABLET | Freq: Every day | ORAL | Status: DC

## 2023-03-18 MED ORDER — LEVOTHYROXINE SODIUM 25 MCG PO TABS: 50.0000 ug | ORAL_TABLET | Freq: Every day | ORAL | Status: DC

## 2023-03-18 MED ORDER — DOCUSATE SODIUM 100 MG PO CAPS: 100.0000 mg | ORAL_CAPSULE | Freq: Two times a day (BID) | ORAL | Status: DC

## 2023-03-18 MED ORDER — APIXABAN 5 MG PO TABS: 5.0000 mg | ORAL_TABLET | Freq: Two times a day (BID) | ORAL | Status: DC

## 2023-03-18 MED ORDER — ESCITALOPRAM OXALATE 10 MG PO TABS: 10.0000 mg | ORAL_TABLET | Freq: Every day | ORAL | Status: DC

## 2023-03-18 MED ORDER — CLONIDINE HCL 0.1 MG PO TABS: 0.1000 mg | ORAL_TABLET | Freq: Every day | ORAL | Status: DC

## 2023-03-18 MED ORDER — PANTOPRAZOLE SODIUM 40 MG PO TBEC: 40.0000 mg | DELAYED_RELEASE_TABLET | Freq: Every day | ORAL | Status: DC

## 2023-03-18 MED ORDER — HYDROCHLOROTHIAZIDE 25 MG PO TABS: 25.0000 mg | ORAL_TABLET | Freq: Every day | ORAL | Status: DC

## 2023-03-18 MED ORDER — LEVETIRACETAM 500 MG PO TABS
500.0000 mg | ORAL_TABLET | Freq: Two times a day (BID) | ORAL | Status: DC
  Filled 2023-03-18: qty 1

## 2023-03-18 NOTE — BH Assessment (Addendum)
 PATIENT BED AVAILABLE AT 11AM ON 03/18/23  Patient has been accepted to [facility]  Accepting physician is Dr. Jonelle Sidle.  Call report to [phone] option 2 Representative was [facility].   ER Staff is aware of it:  Carlene ER Secretary  Dr. Modesto Charon, ER MD  [facility] Patient's Nurse     Attempted to contact Patient's Legal Guardian Leotis Shames Roll[place] [phone]) but no answer, a HIPAA compliant voicemail was left to return phone call

## 2023-03-18 NOTE — BH Assessment (Signed)
 Per [facility] ([place]), patient to be referred out of system.  Referral information for Psychiatric Hospitalization faxed to;   [facility] [phone]- [phone]) No available beds  [facility] [phone]- [phone]),   [facility] [phone]),  [address] [phone]),   [facility] [phone] -or- [phone]),   [facility] [phone])  [facility] [phone])

## 2023-03-18 NOTE — ED Notes (Signed)
 EMTALA Reviewed by this RN.

## 2023-03-18 NOTE — ED Notes (Signed)
 Spoke to [employer], [employer] for [facility] @ [phone].  Verbal consent given to transport witnessed by 2nd RN Morrie Sheldon, M.

## 2023-03-18 NOTE — ED Provider Notes (Signed)
 Emergency Medicine Observation Re-evaluation Note  Adali Pennings is a 28 y.o. female, seen on rounds today.  Pt initially presented to the ED for complaints of Psychiatric Evaluation  Currently, the patient is is no acute distress. Denies any concerns at this time.  Physical Exam  Blood pressure 127/62, pulse (!) 117, temperature 98.5 F (36.9 C), temperature source Oral, resp. rate 18, height 5\' 4"  (1.626 m), weight (!) 152 kg, SpO2 96%.  Physical Exam: General: No apparent distress Pulm: Normal WOB Neuro: Moving all extremities Psych: Resting comfortably     ED Course / MDM   Clinical Course as of 03/18/23 0748  Fri Mar 17, 2023  2335 Psych recommends inpatient admission [DW]    Clinical Course User Index [DW] Janith Lima, MD    I have reviewed the labs performed to date as well as medications administered while in observation.  Recent changes in the last 24 hours include: No acute events overnight.  Plan   Current plan: Transfer to inpatient psychiatric facility in stable condition    Corena Herter, MD 03/18/23 1538
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
## 2023-03-18 NOTE — ED Notes (Signed)
 Called [place] controller @ [facility] to page oncall SW for verbal consent for transfer

## 2023-03-18 NOTE — ED Notes (Signed)
 PATIENT BED AVAILABLE AT 11AM ON 03/18/23   Patient has been accepted to [facility].  Accepting physician is Dr. Jonelle Sidle.  Call report to [phone] option 2 Representative was [facility].    ER Staff is aware of it:  Carlene ER Secretary  Dr. Modesto Charon, ER MD  [facility] Patient's Nurse

## 2023-03-18 NOTE — ED Notes (Signed)
 Pt provided with breakfast tray. Pt is asleep. Tray left at bedside.

## 2023-04-16 ENCOUNTER — Other Ambulatory Visit: Payer: Self-pay

## 2023-04-16 ENCOUNTER — Emergency Department
Admission: EM | Admit: 2023-04-16 | Discharge: 2023-04-18 | Disposition: A | Discharge status: Nursing Home (Includes ICF) | Payer: MEDICAID | Attending: Emergency Medicine | Admitting: Emergency Medicine

## 2023-04-16 ENCOUNTER — Encounter: Payer: Self-pay | Admitting: Emergency Medicine

## 2023-04-16 DIAGNOSIS — F418 Other specified anxiety disorders: Secondary | ICD-10-CM | POA: Diagnosis not present

## 2023-04-16 DIAGNOSIS — F3481 Disruptive mood dysregulation disorder: Secondary | ICD-10-CM | POA: Insufficient documentation

## 2023-04-16 DIAGNOSIS — Z87891 Personal history of nicotine dependence: Secondary | ICD-10-CM | POA: Diagnosis not present

## 2023-04-16 DIAGNOSIS — F79 Unspecified intellectual disabilities: Secondary | ICD-10-CM | POA: Diagnosis present

## 2023-04-16 DIAGNOSIS — R45851 Suicidal ideations: Secondary | ICD-10-CM | POA: Insufficient documentation

## 2023-04-16 DIAGNOSIS — F603 Borderline personality disorder: Secondary | ICD-10-CM | POA: Diagnosis not present

## 2023-04-16 DIAGNOSIS — F319 Bipolar disorder, unspecified: Secondary | ICD-10-CM | POA: Diagnosis not present

## 2023-04-16 DIAGNOSIS — R440 Auditory hallucinations: Secondary | ICD-10-CM

## 2023-04-16 NOTE — ED Notes (Signed)
 Pt changed out into burgundy scrubs, all items placed into patient belonging bag, includes: -blue shoes -green shirt -blue pattern pants

## 2023-04-16 NOTE — ED Triage Notes (Signed)
 Pt BIB [facility] from [facility] with suicidal thoughts x 2 days. Pt states she had unprotected intercourse on 4/3, and it was her first time. Pt states it was consensual, but feels "frazzled" from the event, states she took a Plan B pill the next day but feels some remorse. Reports frequent nightmares and depression after mom passed last year. Reports hearing some voices telling her to end her life, no specific plan at this time. Denies any substance.

## 2023-04-17 DIAGNOSIS — F3481 Disruptive mood dysregulation disorder: Secondary | ICD-10-CM

## 2023-04-17 DIAGNOSIS — F319 Bipolar disorder, unspecified: Secondary | ICD-10-CM

## 2023-04-17 DIAGNOSIS — F79 Unspecified intellectual disabilities: Secondary | ICD-10-CM

## 2023-04-17 LAB — COMPREHENSIVE METABOLIC PANEL WITH GFR
ALT: 16 U/L (ref 0–44)
AST: 13 U/L — ABNORMAL LOW (ref 15–41)
Albumin: 2.8 g/dL — ABNORMAL LOW (ref 3.5–5.0)
Alkaline Phosphatase: 47 U/L (ref 38–126)
Anion gap: 7 (ref 5–15)
BUN: 19 mg/dL (ref 6–20)
CO2: 24 mmol/L (ref 22–32)
Calcium: 8.9 mg/dL (ref 8.9–10.3)
Chloride: 107 mmol/L (ref 98–111)
Creatinine, Ser: 0.92 mg/dL (ref 0.44–1.00)
GFR, Estimated: >60 mL/min (ref >60)
Glucose, Bld: 132 mg/dL — ABNORMAL HIGH (ref 70–99)
Potassium: 4.2 mmol/L (ref 3.5–5.1)
Sodium: 138 mmol/L (ref 135–145)
Total Bilirubin: 0.3 mg/dL (ref 0.0–1.2)
Total Protein: 6.4 g/dL — ABNORMAL LOW (ref 6.5–8.1)

## 2023-04-17 LAB — VALPROIC ACID LEVEL: Valproic Acid Lvl: 57 ug/mL (ref 50.0–100.0)

## 2023-04-17 LAB — CBC
HCT: 34.2 % — ABNORMAL LOW (ref 36.0–46.0)
Hemoglobin: 10.5 g/dL — ABNORMAL LOW (ref 12.0–15.0)
MCH: 22.2 pg — ABNORMAL LOW (ref 26.0–34.0)
MCHC: 30.7 g/dL (ref 30.0–36.0)
MCV: 72.5 fL — ABNORMAL LOW (ref 80.0–100.0)
Platelets: 279 10*3/uL (ref 150–400)
RBC: 4.72 MIL/uL (ref 3.87–5.11)
RDW: 21.1 % — ABNORMAL HIGH (ref 11.5–15.5)
WBC: 10.8 10*3/uL — ABNORMAL HIGH (ref 4.0–10.5)
nRBC: 0 % (ref 0.0–0.2)

## 2023-04-17 LAB — ACETAMINOPHEN LEVEL: Acetaminophen (Tylenol), Serum: <10 ug/mL — ABNORMAL LOW (ref 10–30)

## 2023-04-17 LAB — URINE DRUG SCREEN, QUALITATIVE (ARMC ONLY)
Amphetamines, Ur Screen: NOT DETECTED
Barbiturates, Ur Screen: NOT DETECTED
Benzodiazepine, Ur Scrn: NOT DETECTED
Cannabinoid 50 Ng, Ur ~~LOC~~: NOT DETECTED
Cocaine Metabolite,Ur ~~LOC~~: NOT DETECTED
MDMA (Ecstasy)Ur Screen: NOT DETECTED
Methadone Scn, Ur: NOT DETECTED
Opiate, Ur Screen: NOT DETECTED
Phencyclidine (PCP) Ur S: NOT DETECTED
Tricyclic, Ur Screen: NOT DETECTED

## 2023-04-17 LAB — POC URINE PREG, ED: Preg Test, Ur: NEGATIVE

## 2023-04-17 LAB — ETHANOL: Alcohol, Ethyl (B): <10 mg/dL (ref <10)

## 2023-04-17 LAB — SALICYLATE LEVEL: Salicylate Lvl: <7 mg/dL — ABNORMAL LOW (ref 7.0–30.0)

## 2023-04-17 NOTE — ED Notes (Signed)
Pt refused shower.  

## 2023-04-17 NOTE — ED Notes (Signed)
 Pt had urinated all over the bed. EDT Audelia Acton was approached by pt regarding needing clean sheets. Pt was refusing to shower and only wanted clean sheet. Pt instructed after her meeting she is to take a shower. Linens changed by EDT lashaunda

## 2023-04-17 NOTE — ED Notes (Signed)
 Order placed for pt breakfast. Pt informed of food coming soon. Pt also informed of plans for taking shower today after breakfast.

## 2023-04-17 NOTE — ED Provider Notes (Signed)
 [facility] Provider Note    Event Date/Time   First MD Initiated Contact with Patient 04/16/23 2307     (approximate)   History   Suicidal   HPI Sherry Bryan is a 28 y.o. female who reportedly has multiple psychiatric disorders including major depressive disorder, adjustment disorder, borderline personality disorder, conduct disorder, disruptive mood dysregulation disorder, and prior homicidal ideations.  She presents for evaluation of auditory hallucinations and having some thoughts about killing herself.  She states that she had her first sexual intercourse experience several days ago (unprotected).  She states that she has been "frazzled" since that time and more anxious than usual.  Additionally, she has hearing voices in her head that is telling her that she should kill herself.  She said that she has heard the voices before but they do not always tell her to do things like that.  She has no medical complaints or concerns at this time.     Physical Exam   Triage Vital Signs: ED Triage Vitals [04/16/23 2211]  Encounter Vitals Group     BP (!) 116/57     Systolic BP Percentile      Diastolic BP Percentile      Pulse Rate 91     Resp 18     Temp 98.5 F (36.9 C)     Temp Source Oral     SpO2      Weight (!) 152 kg (335 lb)     Height      Head Circumference      Peak Flow      Pain Score      Pain Loc      Pain Education      Exclude from Growth Chart     Most recent vital signs: Vitals:   04/16/23 2211 04/17/23 0737  BP: (!) 116/57 94/69  Pulse: 91 94  Resp: 18 16  Temp: 98.5 F (36.9 C) 98.9 F (37.2 C)  SpO2:  97%    General: Awake, no distress.  CV:  Good peripheral perfusion.  Resp:  Normal effort. Speaking easily and comfortably, no accessory muscle usage nor intercostal retractions.   Abd:  No distention.  Other:  Calm and cooperative.  Makes eye contact.  Does not seem to be responding to internal stimuli.  Reporting  auditory hallucinations that are telling her to kill herself.   ED Results / Procedures / Treatments   Labs (all labs ordered are listed, but only abnormal results are displayed) Labs Reviewed  COMPREHENSIVE METABOLIC PANEL WITH GFR - Abnormal; Notable for the following components:      Result Value   Glucose, Bld 132 (*)    Total Protein 6.4 (*)    Albumin 2.8 (*)    AST 13 (*)    All other components within normal limits  SALICYLATE LEVEL - Abnormal; Notable for the following components:   Salicylate Lvl <7.0 (*)    All other components within normal limits  ACETAMINOPHEN LEVEL - Abnormal; Notable for the following components:   Acetaminophen (Tylenol), Serum <10 (*)    All other components within normal limits  CBC - Abnormal; Notable for the following components:   WBC 10.8 (*)    Hemoglobin 10.5 (*)    HCT 34.2 (*)    MCV 72.5 (*)    MCH 22.2 (*)    RDW 21.1 (*)    All other components within normal limits  POC URINE PREG, ED - Normal  ETHANOL  URINE DRUG SCREEN, QUALITATIVE ([facility] ONLY)  VALPROIC ACID LEVEL     PROCEDURES:  Critical Care performed: No  Procedures    IMPRESSION / MDM / ASSESSMENT AND PLAN / ED COURSE  I reviewed the triage vital signs and the nursing notes.                              Differential diagnosis includes, but is not limited to, adjustment disorder, mood disorder, psychosis  Patient's presentation is most consistent with acute presentation with potential threat to life or bodily function.  Labs/studies ordered: As per protocol, I ordered the following labs as part of the patient's medical and psychiatric evaluation:  CBC, CMP, ethanol level, acetaminophen level, salicylate level, urine drug screen.  Also obtain Depakote level  Interventions/Medications given:  Medications - No data to display  (Note:  hospital course my include additional interventions and/or labs/studies not listed above.)   Physical exam and laboratory  workup are all reassuring including a therapeutic Depakote level.  Patient has no medical complaints or concerns at this time.  She deserves a psychiatric evaluation and I am ordering the consult.  She is medically cleared for psych assessment.  The patient has been placed in psychiatric observation due to the need to provide a safe environment for the patient while obtaining psychiatric consultation and evaluation, as well as ongoing medical and medication management to treat the patient's condition.  The patient has not been placed under full IVC at this time.      FINAL CLINICAL IMPRESSION(S) / ED DIAGNOSES   Final diagnoses:  Suicidal ideations  Auditory hallucinations     Rx / DC Orders   ED Discharge Orders     None        Note:  This document was prepared using Dragon voice recognition software and may include unintentional dictation errors.   Loleta Rose, MD 04/17/23 [phone]

## 2023-04-17 NOTE — ED Notes (Signed)
 Patient to BHU 1 from main ED.  Patient oriented to cameras and rounding, verbalized understanding.
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
## 2023-04-17 NOTE — ED Notes (Signed)
 Pt instructed that she needs to take a shower due to her urinating all over herself. Pt firmly refused and stated we can't make her and she isn't going to take one.

## 2023-04-17 NOTE — ED Notes (Addendum)
 Pt provided with lunch tray.
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
## 2023-04-17 NOTE — ED Notes (Signed)
 Pt received snack, declined drink

## 2023-04-17 NOTE — ED Notes (Signed)
 Pt given breakfast tray by EDT Sunny Schlein

## 2023-04-17 NOTE — BH Assessment (Signed)
 This Clinical research associate contacted [facility] to request a telepsych assessment for patient, assessment is currently pending

## 2023-04-17 NOTE — ED Notes (Signed)
 Pt provided with dinner tray. Pt laying down eating.

## 2023-04-17 NOTE — ED Notes (Signed)
 Pt updated on plans for 300 meeting. Pt verbalized understanding.

## 2023-04-17 NOTE — ED Notes (Signed)
TTS speaking with patient. 

## 2023-04-18 NOTE — ED Notes (Signed)
 Safe transport called for transport to [facility] . Spoke with Alona Bene.

## 2023-04-18 NOTE — ED Notes (Signed)
 Spoke to legal guardian witnessed by [facility] staff x2 ok for pt to return via safe transport to group home [facility] [address] [place] [address]. Group home owner Carmin Richmond [phone], as well as [facility] manager murphy,Latoya [phone] have been notified by LG and are agreeable as well.

## 2023-04-18 NOTE — ED Provider Notes (Signed)
 Emergency Medicine Observation Re-evaluation Note  No issues overnight  Physical Exam  BP 122/84 (BP Location: Left Arm)   Pulse 88   Temp 98.6 F (37 C) (Oral)   Resp 17   Wt (!) 152 kg   SpO2 92%   BMI 57.50 kg/m   ED Course / MDM  Patient seen by psychiatry, they do not recommend admission, patient is cleared for discharge, will attempt to reach guardian  Plan  Current plan is for discharge today    Jene Every, MD 04/18/23 [phone]

## 2023-04-18 NOTE — ED Notes (Signed)
 Pt A/O x3 denies all SI/HI, reports no A/V hallucinations. Pt guardian verbalized agreement for pt to return to [facility] via safe transport.  All belongs returned and accounted for.
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
## 2023-04-29 ENCOUNTER — Emergency Department: Payer: MEDICAID

## 2023-04-29 ENCOUNTER — Emergency Department
Admission: EM | Admit: 2023-04-29 | Discharge: 2023-04-30 | Disposition: A | Discharge status: Home / Self Care | Payer: MEDICAID | Attending: Emergency Medicine | Admitting: Emergency Medicine

## 2023-04-29 ENCOUNTER — Other Ambulatory Visit: Payer: Self-pay

## 2023-04-29 ENCOUNTER — Encounter: Payer: Self-pay | Admitting: Emergency Medicine

## 2023-04-29 DIAGNOSIS — F419 Anxiety disorder, unspecified: Secondary | ICD-10-CM | POA: Diagnosis not present

## 2023-04-29 DIAGNOSIS — M546 Pain in thoracic spine: Secondary | ICD-10-CM | POA: Diagnosis not present

## 2023-04-29 DIAGNOSIS — R079 Chest pain, unspecified: Secondary | ICD-10-CM | POA: Diagnosis present

## 2023-04-29 LAB — CBC WITH DIFFERENTIAL/PLATELET
Abs Immature Granulocytes: 0.01 K/uL (ref 0.00–0.07)
Basophils Absolute: 0 K/uL (ref 0.0–0.1)
Basophils Relative: 0 %
Eosinophils Absolute: 0 K/uL (ref 0.0–0.5)
Eosinophils Relative: 0 %
HCT: 38 % (ref 36.0–46.0)
Hemoglobin: 11.4 g/dL — ABNORMAL LOW (ref 12.0–15.0)
Immature Granulocytes: 0 %
Lymphocytes Relative: 24 %
Lymphs Abs: 1.9 K/uL (ref 0.7–4.0)
MCH: 22.4 pg — ABNORMAL LOW (ref 26.0–34.0)
MCHC: 30 g/dL (ref 30.0–36.0)
MCV: 74.8 fL — ABNORMAL LOW (ref 80.0–100.0)
Monocytes Absolute: 0.5 K/uL (ref 0.1–1.0)
Monocytes Relative: 6 %
Neutro Abs: 5.7 K/uL (ref 1.7–7.7)
Neutrophils Relative %: 70 %
Platelets: 208 K/uL (ref 150–400)
RBC: 5.08 MIL/uL (ref 3.87–5.11)
RDW: 19.9 % — ABNORMAL HIGH (ref 11.5–15.5)
WBC: 8.1 K/uL (ref 4.0–10.5)
nRBC: 0 % (ref 0.0–0.2)

## 2023-04-29 LAB — URINE DRUG SCREEN, QUALITATIVE (ARMC ONLY)
Amphetamines, Ur Screen: NOT DETECTED
Barbiturates, Ur Screen: NOT DETECTED
Benzodiazepine, Ur Scrn: NOT DETECTED
Cannabinoid 50 Ng, Ur ~~LOC~~: NOT DETECTED
Cocaine Metabolite,Ur ~~LOC~~: NOT DETECTED
MDMA (Ecstasy)Ur Screen: NOT DETECTED
Methadone Scn, Ur: NOT DETECTED
Opiate, Ur Screen: NOT DETECTED
Phencyclidine (PCP) Ur S: NOT DETECTED
Tricyclic, Ur Screen: NOT DETECTED

## 2023-04-29 LAB — COMPREHENSIVE METABOLIC PANEL WITH GFR
ALT: 12 U/L (ref 0–44)
AST: 13 U/L — ABNORMAL LOW (ref 15–41)
Albumin: 3.2 g/dL — ABNORMAL LOW (ref 3.5–5.0)
Alkaline Phosphatase: 58 U/L (ref 38–126)
Anion gap: 8 (ref 5–15)
BUN: 15 mg/dL (ref 6–20)
CO2: 26 mmol/L (ref 22–32)
Calcium: 9 mg/dL (ref 8.9–10.3)
Chloride: 102 mmol/L (ref 98–111)
Creatinine, Ser: 1 mg/dL (ref 0.44–1.00)
GFR, Estimated: >60 mL/min (ref >60)
Glucose, Bld: 96 mg/dL (ref 70–99)
Potassium: 3.7 mmol/L (ref 3.5–5.1)
Sodium: 136 mmol/L (ref 135–145)
Total Bilirubin: 0.5 mg/dL (ref 0.0–1.2)
Total Protein: 7.3 g/dL (ref 6.5–8.1)

## 2023-04-29 LAB — TROPONIN I (HIGH SENSITIVITY)
Troponin I (High Sensitivity): 2 ng/L
Troponin I (High Sensitivity): <2 ng/L (ref <18)

## 2023-04-29 LAB — HCG, QUANTITATIVE, PREGNANCY: hCG, Beta Chain, Quant, S: <1 m[IU]/mL (ref <5)

## 2023-04-29 LAB — ACETAMINOPHEN LEVEL: Acetaminophen (Tylenol), Serum: <10 ug/mL — ABNORMAL LOW (ref 10–30)

## 2023-04-29 LAB — ETHANOL: Alcohol, Ethyl (B): <10 mg/dL

## 2023-04-29 LAB — SALICYLATE LEVEL: Salicylate Lvl: <7 mg/dL — ABNORMAL LOW (ref 7.0–30.0)

## 2023-04-29 NOTE — ED Notes (Signed)
 Group home owner Mona Angle [phone]) called back and stated that she has no transportation available to pick up pt tonight.  She will be able to pick up at 0800 tomorrow.  EDP Tan notified.

## 2023-04-29 NOTE — ED Notes (Addendum)
 [facility] owner stated that it has been discussed with Ms Janeane Mealy prior to her calling for medical services she should notify [facility] owner murphy,Latoya @ [phone] as well as her legal guardian from [facility]. Dss, Rollings,Lorrin (Legal Guardian) [phone] (Work Phone).they both have been working with Ms Sciarra and stated that they have encouraged her to notify [facility] workers as well as call a crisis phone number they have provided for [facility] to her prior to calling EMS.  Ms Abigail Abler stated that Ms Worland talks about the hospital and how she "receives snacks and soda and is able to use the phone as much as she wants to". Per [facility] she is not allowed to use the phone, or receive any additional snacks and no sodas. [facility] owner Grady Lawman [phone], stated that she will come and pick her up as soon as she is cleared.
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
## 2023-04-29 NOTE — ED Notes (Addendum)
 Called dispatch for [place]. DSS @ [phone] to notify on call SW and receive permission to treat. LM with dispatch

## 2023-04-29 NOTE — ED Notes (Signed)
 Lab called stating that urine sample sent was insufficient for testing. Pt given cup of water and encouraged to provide another sample.

## 2023-04-29 NOTE — ED Notes (Signed)
 Urine sample obtained and resent to lab at this time.
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
## 2023-04-29 NOTE — ED Triage Notes (Addendum)
 Pt BIB EMS from group home for anxiety, chest pain, and back pain that began about 1 hour prior to arrival to ED. Chest pain rated 8/10. Pt states this is the worst Anxiety attack she has felt. Pt does has h/o asthma. Pt states her guardian is not aware she is at the ED.

## 2023-04-29 NOTE — ED Notes (Signed)
 Pt given sandwich tray and water

## 2023-04-30 ENCOUNTER — Other Ambulatory Visit: Payer: Self-pay

## 2023-04-30 ENCOUNTER — Emergency Department
Admission: EM | Admit: 2023-04-30 | Discharge: 2023-05-13 | Disposition: A | Discharge status: Home / Self Care | Payer: MEDICAID | Attending: Emergency Medicine | Admitting: Emergency Medicine

## 2023-04-30 DIAGNOSIS — F603 Borderline personality disorder: Secondary | ICD-10-CM | POA: Diagnosis not present

## 2023-04-30 DIAGNOSIS — R45851 Suicidal ideations: Secondary | ICD-10-CM | POA: Insufficient documentation

## 2023-04-30 DIAGNOSIS — F32A Depression, unspecified: Secondary | ICD-10-CM | POA: Insufficient documentation

## 2023-04-30 DIAGNOSIS — F79 Unspecified intellectual disabilities: Secondary | ICD-10-CM | POA: Diagnosis not present

## 2023-04-30 LAB — PREGNANCY, URINE: Preg Test, Ur: NEGATIVE

## 2023-04-30 NOTE — ED Notes (Signed)
 Attempted to call Guardian to inform of patient status in ED

## 2023-04-30 NOTE — ED Notes (Addendum)
 Pt dressed out:  [employer] pants Pink shirt Pt wearing brief - let her continue to wear.
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
## 2023-04-30 NOTE — ED Notes (Signed)
 No labs collected as they was just collected yesterday.

## 2023-04-30 NOTE — ED Triage Notes (Addendum)
 Pt to ed from group home via [facility] under IVC for same as she has been seen multiple times before. Pt states "I am feeling suicidal". Pt has no active plan or intent to act on said feelings. Pt is caox4, in no acute distress and ambulatory in triage. Pt states "I miss my parents is why I said I am suicidal". Parents live in [place]. Pt said her parents sent her to group home due to her behaviors.   [facility]  [address] [place] point of contact [phone]

## 2023-04-30 NOTE — ED Notes (Signed)
 Pt requested her ride be given a call. Spoke to Latoya

## 2023-04-30 NOTE — ED Notes (Signed)
 Patient resting with eyes closed.  RR even and unlabored.  Patient is easily aroused by verbal stimuli or light touch.  No needs voiced at present.  No acute distress noted.

## 2023-05-01 DIAGNOSIS — F603 Borderline personality disorder: Secondary | ICD-10-CM | POA: Diagnosis not present

## 2023-05-01 DIAGNOSIS — F32A Depression, unspecified: Secondary | ICD-10-CM

## 2023-05-01 DIAGNOSIS — F79 Unspecified intellectual disabilities: Secondary | ICD-10-CM

## 2023-05-01 MED ORDER — HYDROCORTISONE 1 % EX CREA
TOPICAL_CREAM | Freq: Two times a day (BID) | CUTANEOUS | Status: DC
  Administered 2023-05-01: 1 via TOPICAL
  Filled 2023-05-01 (×2): qty 28

## 2023-05-01 MED ORDER — PRAZOSIN HCL 1 MG PO CAPS
2.0000 mg | ORAL_CAPSULE | Freq: Every day | ORAL | Status: DC
  Administered 2023-05-01 – 2023-05-12 (×12): 2 mg via ORAL
  Filled 2023-05-01 (×12): qty 2

## 2023-05-01 MED ORDER — RISPERIDONE 1 MG PO TABS
1.0000 mg | ORAL_TABLET | Freq: Two times a day (BID) | ORAL | Status: DC
  Administered 2023-05-01 – 2023-05-13 (×25): 1 mg via ORAL
  Filled 2023-05-01 (×25): qty 1

## 2023-05-01 MED ORDER — LORAZEPAM 1 MG PO TABS: 1.0000 mg | ORAL_TABLET | Freq: Three times a day (TID) | ORAL | Status: DC | PRN

## 2023-05-01 MED ORDER — DIVALPROEX SODIUM 500 MG PO DR TAB
500.0000 mg | DELAYED_RELEASE_TABLET | Freq: Three times a day (TID) | ORAL | Status: DC
  Administered 2023-05-01 – 2023-05-13 (×36): 500 mg via ORAL
  Filled 2023-05-01 (×36): qty 1

## 2023-05-01 MED ORDER — OLANZAPINE 10 MG PO TBDP: 10.0000 mg | ORAL_TABLET | Freq: Three times a day (TID) | ORAL | Status: DC | PRN

## 2023-05-01 MED ORDER — FLUOXETINE HCL 20 MG PO CAPS
40.0000 mg | ORAL_CAPSULE | Freq: Every day | ORAL | Status: DC
  Administered 2023-05-01 – 2023-05-13 (×13): 40 mg via ORAL
  Filled 2023-05-01 (×13): qty 2

## 2023-05-01 MED ORDER — TRAZODONE HCL 100 MG PO TABS
300.0000 mg | ORAL_TABLET | Freq: Every day | ORAL | Status: DC
  Administered 2023-05-01 – 2023-05-12 (×12): 300 mg via ORAL
  Filled 2023-05-01 (×12): qty 3

## 2023-05-01 NOTE — ED Notes (Signed)
 IVC prior to arrival/ Consult ordered/Pending/Legal Paperwork

## 2023-05-01 NOTE — ED Provider Notes (Signed)
 Emergency Medicine Observation Re-evaluation Note  Sherry Bryan is a 28 y.o. female, seen on rounds today.  Pt initially presented to the ED for complaints of Psychiatric Evaluation Currently, the patient is in no acute distress.  Physical Exam  BP (!) 118/91   Pulse 99   Temp 97.8 F (36.6 C) (Oral)   Resp 18   Ht 5\' 4"  (1.626 m)   SpO2 96%   BMI 64.37 kg/m  Physical Exam General: No acute distress Lungs: Normal work of breathing Psych: Resting comfortably  ED Course / MDM  EKG:   I have reviewed the labs performed to date as well as medications administered while in observation.  No acute events on prior shift.  Plan  Current plan is for psychiatric disposition recommendations.  On full IVC.    Collis Deaner, MD 05/01/23 [phone]

## 2023-05-01 NOTE — ED Notes (Signed)
 Report called and given to [facility] RN, Burdette Carolin. Pt made aware of transport to new room. All belongings grabbed in preparation for move. Pt ABCs intact. RR even and unlabored. Pt in NAD. Denies needs at this time.

## 2023-05-01 NOTE — ED Notes (Signed)
 This RN spoke with Moira Andrews with [facility] as pt's legal guardian. Legal guardian given update at this time.
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
## 2023-05-01 NOTE — ED Notes (Signed)
 Pt given snack.

## 2023-05-01 NOTE — BH Assessment (Addendum)
 Per [facility] AC Loetta Ringer), patient to be referred out of system.  Referral information for Psychiatric Hospitalization faxed to;   [facility] [phone]) No appropriate bed available   [facility] ([phone]),   [facility] [phone]), Denied by facility  [facility] [phone] -or- [phone]),   [facility] [phone])  [facility] [phone])

## 2023-05-01 NOTE — ED Notes (Signed)
 IVC inpt psych placement

## 2023-05-01 NOTE — ED Notes (Signed)
 Hospital meal provided, pt tolerated w/o complaints.  Waste discarded appropriately.

## 2023-05-01 NOTE — ED Provider Notes (Signed)
 Emergency Medicine Observation Re-evaluation Note  Sherry Bryan is a 28 y.o. female, seen on rounds today.  Pt initially presented to the ED for complaints of Psychiatric Evaluation  Currently, the patient is is no acute distress. Denies any concerns at this time.  Physical Exam  Blood pressure 100/60, pulse 75, temperature 98.2 F (36.8 C), temperature source Oral, resp. rate 16, height 5\' 4"  (1.626 m), SpO2 98%.  Physical Exam: General: No apparent distress Pulm: Normal WOB Neuro: Moving all extremities Psych: Resting comfortably     ED Course / MDM     I have reviewed the labs performed to date as well as medications administered while in observation.  Recent changes in the last 24 hours include: No acute events overnight.  Plan   Current plan: Patient awaiting psychiatric disposition. Patient is under full IVC at this time.    Keishawn Rajewski, Clover Dao, DO 05/01/23 [phone]

## 2023-05-02 NOTE — ED Notes (Signed)
 Scanned IDD into chart

## 2023-05-02 NOTE — ED Provider Notes (Signed)
 Emergency Medicine Observation Re-evaluation Note  Sherry Bryan is a 28 y.o. female, seen on rounds today.  Pt initially presented to the ED for complaints of Psychiatric Evaluation Currently, the patient is resting comfortably.  Physical Exam  BP 138/76   Pulse 94   Temp 98.3 F (36.8 C) (Oral)   Resp 16   Ht 5\' 4"  (1.626 m)   SpO2 100%   BMI 64.37 kg/m  Physical Exam General: No acute distress  ED Course / MDM  EKG:   I have reviewed the labs performed to date as well as medications administered while in observation.  Recent changes in the last 24 hours include no acute events.  Plan  Current plan is for inpatient psychiatric admission pending placement.    Kandee Orion, MD 05/02/23 [phone]

## 2023-05-02 NOTE — ED Notes (Addendum)
 Pt given hygiene items to shower.  Pt entered shower for <3 min.s Ms Kamphuis did not wash as the soap provided was not opened as well as the towels and wash cloths we dry.  Staff stressed the importance of personal hygiene as well as had a gentle conversation about pt being malodorous to the point where peers are complaining.  Pt stated "so what, I don't care I'm not taking a shower."  Staff walked away to not agitate pt any more.

## 2023-05-02 NOTE — ED Notes (Addendum)
 There are no appropriate beds available at [facility] for patient today.  TTS has referred patient out to other facilities.       1:04pm:  [facility] spoke to Pt's legal guardian, Lorrin.  Lorrin referred [facility] to [facility] for most updated IQ scores.   VCM provided IQ testing results completed at [facility].  VCM reported that [facility] is familiar with pt and pt enjoyed receiving care from [facility].   [facility] spoke to [facility] who stated that although pt was initially denied, they would have pt reviewed by Physician.   4:00pm:  [facility] received call from [facility] to decline acceptance due to pt's challenging behaviors.  [facility] spoke to nursing secretary, Lavonna Prader, who will upload scores to media for future reference.  [facility] spoke to [facility] @[place]  Serafina Damme stated would have Psych staff review pt along with scores.  TTS will re-fax pt out.    Macky Sayres, [facility]

## 2023-05-02 NOTE — ED Notes (Signed)
Pt was provided with pm snack.

## 2023-05-02 NOTE — ED Notes (Signed)
 IVC/Pending Placement

## 2023-05-02 NOTE — ED Notes (Signed)
 IVC /recommend inpatient psych

## 2023-05-03 MED ORDER — NYSTATIN 100000 UNIT/GM EX POWD
Freq: Two times a day (BID) | CUTANEOUS | Status: AC
  Filled 2023-05-03: qty 15

## 2023-05-03 NOTE — ED Provider Notes (Signed)
 Emergency Medicine Observation Re-evaluation Note  Sherry Bryan is a 28 y.o. female, awaiting psych disposition  Physical Exam  BP 115/68 (BP Location: Left Arm)   Pulse 98   Temp 97.9 F (36.6 C) (Oral)   Resp 18   Ht 5\' 4"  (1.626 m)   SpO2 97%   BMI 64.37 kg/m  Physical Exam General: calm   ED Course / MDM  No new labs in past 24 hours  Plan  Current plan is for psychiatric disposition.    Sherry Soho, MD 05/03/23 [phone]

## 2023-05-03 NOTE — BH Assessment (Signed)
 Referral information for Psychiatric Hospitalization RE-faxed to;   [facility] [place] [phone]  [facility] [phone]  [facility] [phone]  [facility] [phone]  [facility] [phone]  [facility] [phone]  [facility] [phone]  [facility] [phone]  [facility] [phone]  [place] [facility] [phone]  [facility] [phone]  [facility] [phone]

## 2023-05-03 NOTE — ED Notes (Signed)
 Pt provided with PM snack and drink.

## 2023-05-03 NOTE — ED Notes (Signed)
 [facility] received update from Sherry Bryan @ Sherry Bryan. Pt was denied due to IDD.  Macky Sayres, [facility]

## 2023-05-03 NOTE — ED Notes (Signed)
 IVC/  PENDING  PLACEMENT

## 2023-05-04 MED ORDER — LEVETIRACETAM 500 MG PO TABS
500.0000 mg | ORAL_TABLET | Freq: Two times a day (BID) | ORAL | Status: DC
  Administered 2023-05-04 – 2023-05-13 (×19): 500 mg via ORAL
  Filled 2023-05-04 (×21): qty 1

## 2023-05-04 MED ORDER — APIXABAN 5 MG PO TABS
5.0000 mg | ORAL_TABLET | Freq: Two times a day (BID) | ORAL | Status: DC
  Administered 2023-05-04 – 2023-05-13 (×19): 5 mg via ORAL
  Filled 2023-05-04 (×19): qty 1

## 2023-05-04 MED ORDER — LEVOTHYROXINE SODIUM 25 MCG PO TABS
50.0000 ug | ORAL_TABLET | Freq: Every day | ORAL | Status: DC
  Administered 2023-05-05 – 2023-05-13 (×7): 50 ug via ORAL
  Filled 2023-05-04 (×7): qty 2

## 2023-05-04 NOTE — ED Notes (Signed)
Ivc /pending placement 

## 2023-05-04 NOTE — ED Notes (Signed)
 Pt asked for phone to place call to family

## 2023-05-04 NOTE — ED Notes (Addendum)
 Pt snack was provided

## 2023-05-04 NOTE — ED Notes (Signed)
 Pt Dinner was provided at bedside

## 2023-05-04 NOTE — BH Assessment (Signed)
 Referral information faxed to:  Service Provider Phone  [facility] [phone]  [facility] [phone]  [facility] [phone]  [facility] [phone]  [facility] [phone]  [facility] [phone]  [facility] [phone]  [facility] [phone]  [facility] [phone]  [place] [facility] [phone]  [facility] [phone]  [facility] [phone]  [facility] [phone]  [facility] [phone]  [facility] [phone]  [facility] [phone]  [facility] [phone]  [facility] [phone]

## 2023-05-04 NOTE — ED Notes (Signed)
Pt was provided with pm snack.

## 2023-05-04 NOTE — ED Notes (Signed)
 Pt Breakfast was provided at bedside

## 2023-05-05 LAB — CBC
HCT: 38.5 % (ref 36.0–46.0)
Hemoglobin: 11.3 g/dL — ABNORMAL LOW (ref 12.0–15.0)
MCH: 22.6 pg — ABNORMAL LOW (ref 26.0–34.0)
MCHC: 29.4 g/dL — ABNORMAL LOW (ref 30.0–36.0)
MCV: 76.8 fL — ABNORMAL LOW (ref 80.0–100.0)
Platelets: 196 10*3/uL (ref 150–400)
RBC: 5.01 MIL/uL (ref 3.87–5.11)
RDW: 20.7 % — ABNORMAL HIGH (ref 11.5–15.5)
WBC: 7.8 10*3/uL (ref 4.0–10.5)
nRBC: 0 % (ref 0.0–0.2)

## 2023-05-05 NOTE — ED Provider Notes (Signed)
 Emergency Medicine Observation Re-evaluation Note  Sherry Bryan is a 28 y.o. female, seen on rounds today.  Pt initially presented to the ED for complaints of Psychiatric Evaluation  Currently, the patient is calm, no acute complaints.  Physical Exam  Blood pressure 114/62, pulse 82, temperature 98.5 F (36.9 C), temperature source Oral, resp. rate 18, height 5\' 4"  (1.626 m), SpO2 96%. Physical Exam General: NAD Lungs: CTAB Psych: not agitated  ED Course / MDM  EKG:    I have reviewed the labs performed to date as well as medications administered while in observation.  Recent changes in the last 24 hours include no acute events overnight.    Plan  Current plan is for psych admit. Patient is under full IVC at this time.   Jacquie Maudlin, MD 05/05/23 1010

## 2023-05-05 NOTE — ED Notes (Signed)
 IVC/Pending Placement

## 2023-05-05 NOTE — ED Notes (Signed)
 Sherry Bryan pt's LG called to speak with her

## 2023-05-05 NOTE — BH Assessment (Signed)
 TTS continue to look for a bed but is unable to secure one at this time.

## 2023-05-06 NOTE — BH Assessment (Signed)
 Per [facility], patient to be referred out of system.  Referral information for Psychiatric Hospitalization faxed to;   [facility] [phone]- [phone])  [facility] [phone]- [phone]),   [facility] [phone] -[phone]) [phone]  [place] [phone]--- [phone]--- [phone])  [address] [phone]),   [place] [facility] [phone]- [phone]),   [facility] [phone]),  [place] [phone])  [facility] [phone])

## 2023-05-06 NOTE — ED Notes (Signed)
 IVC, pend psych placement, IVC papers to be renewed on 4.27.25

## 2023-05-06 NOTE — ED Notes (Signed)
 IVC/Pending Placement

## 2023-05-06 NOTE — ED Provider Notes (Signed)
 Emergency Medicine Observation Re-evaluation Note  Sherry Bryan is a 28 y.o. female, seen on rounds today.  Pt initially presented to the ED for complaints of Psychiatric Evaluation Currently, the patient is resting.  Physical Exam  BP 111/63 (BP Location: Left Arm)   Pulse 91   Temp 98.4 F (36.9 C) (Oral)   Resp 15   Ht 5\' 4"  (1.626 m)   SpO2 94%   BMI 64.37 kg/m   General: No distress   ED Course / MDM  EKG:   I have reviewed the labs performed to date as well as medications administered while in observation.  Recent changes in the last 24 hours include none.  Plan  Current plan is for placement.    Arline Bennett, MD 05/06/23 [phone]

## 2023-05-07 NOTE — ED Notes (Signed)
 Pt Given Snack

## 2023-05-07 NOTE — ED Notes (Signed)
 Pt Bedside provided at bedside

## 2023-05-07 NOTE — ED Provider Notes (Signed)
 Emergency Medicine Observation Re-evaluation Note  Sherry Bryan is a 28 y.o. female, seen on rounds today.  Pt initially presented to the ED for complaints of Psychiatric Evaluation Currently, the patient is resting.  Physical Exam  BP 126/67 (BP Location: Right Arm)   Pulse 94   Temp 98.4 F (36.9 C) (Oral)   Resp 15   Ht 5\' 4"  (1.626 m)   SpO2 94%   BMI 64.37 kg/m  Physical Exam .Gen:  No acute distress Resp:  Breathing easily and comfortably, no accessory muscle usage Neuro:  Moving all four extremities, no gross focal neuro deficits Psych:  Resting currently, calm when awake   ED Course / MDM  EKG:   I have reviewed the labs performed to date as well as medications administered while in observation.  Recent changes in the last 24 hours include no acute events.  Plan  Current plan is for psyc dispo.    Shane Darling, MD 05/07/23 [phone]

## 2023-05-07 NOTE — ED Notes (Signed)
 Pt lunch provided at bedside

## 2023-05-07 NOTE — ED Notes (Addendum)
  Patient up to the door asking for Grape juice ,no issues or complaints at this time

## 2023-05-07 NOTE — ED Notes (Signed)
 IVC has been renew waiting on officer to sign custody order

## 2023-05-07 NOTE — ED Notes (Signed)
 Given dinner and beverage

## 2023-05-07 NOTE — ED Notes (Signed)
Pt. Refused shower

## 2023-05-07 NOTE — ED Notes (Signed)
Patient is IVC pending placement 

## 2023-05-08 NOTE — ED Notes (Addendum)
 Pt Breakfast provided at bedside

## 2023-05-08 NOTE — ED Notes (Signed)
 Pt stated they wanted to shower. Pt provided with shower materials. Pt out of shower with clean scrubs on. Materials disposed of approprietly.

## 2023-05-08 NOTE — ED Notes (Signed)
 Ivc PENDING PLACEMENT

## 2023-05-08 NOTE — ED Notes (Signed)
 Hospital meal provided.  100% consumed, pt tolerated w/o complaints.  Waste discarded appropriately.

## 2023-05-08 NOTE — ED Notes (Signed)
 Patient sitting on the side of bed, she had a shower, hair is still wet, she said she felt better, she is eating some of her dinner, staff will continue to monitor for safety.

## 2023-05-08 NOTE — ED Notes (Addendum)
 Pt offer Lunch by ED tech. Refused by pt. She stated I'm not getting up out this bed to eat or shower

## 2023-05-08 NOTE — ED Notes (Signed)
IVC Renewed/ Pending Placement

## 2023-05-08 NOTE — ED Notes (Signed)
Refusing shower.

## 2023-05-08 NOTE — ED Notes (Signed)
 Pt received pm snack.

## 2023-05-09 NOTE — ED Notes (Signed)
Pt given PM snack at this time.  

## 2023-05-09 NOTE — ED Provider Notes (Signed)
 Emergency Medicine Observation Re-evaluation Note  Sherry Bryan is a 28 y.o. female, seen on rounds today.  Pt initially presented to the ED for complaints of Psychiatric Evaluation Currently, the patient is resting in no acute distress.  Physical Exam  BP (!) 97/58 (BP Location: Right Arm)   Pulse (!) 106   Temp 98.7 F (37.1 C) (Oral)   Resp 19   Ht 5\' 4"  (1.626 m)   SpO2 93%   BMI 64.37 kg/m  Physical Exam General: No acute distress Cardiac: Appears well-perfused Lungs: No respiratory distress Psych: No acute distress  ED Course / MDM  EKG:   I have reviewed the labs performed to date as well as medications administered while in observation.  No overnight events.  Plan  Current plan is for psychiatric disposition recommendations.    Collis Deaner, MD 05/09/23 [phone]

## 2023-05-09 NOTE — ED Notes (Signed)
 ivc/pending placement.Marland Kitchen

## 2023-05-09 NOTE — ED Notes (Signed)

## 2023-05-09 NOTE — ED Notes (Signed)
 IVC/Pending Placement

## 2023-05-09 NOTE — ED Notes (Signed)
 Hospital meal provided, pt tolerated w/o complaints.  Waste discarded appropriately.

## 2023-05-10 DIAGNOSIS — F32A Depression, unspecified: Secondary | ICD-10-CM | POA: Diagnosis not present

## 2023-05-10 NOTE — ED Notes (Signed)
 IVC/Pending Placement

## 2023-05-10 NOTE — ED Notes (Signed)
Pt given pm snack

## 2023-05-10 NOTE — ED Notes (Signed)
Ivc /pending placement 

## 2023-05-10 NOTE — Consult Note (Signed)
 [facility] Consult Follow-up  Patient Name: .Sherry Bryan  MRN: [id number]  DOB: 1996/01/04  Consult Order details:  Orders (From admission, onward)     Start     Ordered   05/07/23 1957  IP CONSULT TO PSYCHIATRY       Ordering Provider: Twilla Galea, MD  Provider:  (Not yet assigned)  Question Answer Comment  Place call to: [phone]   Reason for Consult Admit      05/07/23 1957   04/30/23 2145  IP CONSULT TO PSYCHIATRY       Ordering Provider: Claria Crofts, MD  Provider:  (Not yet assigned)  Question Answer Comment  Consult Timeframe ROUTINE - requires response within 24 hours   Reason for Consult? Consult for medication management   Contact phone number where the requesting provider can be reached [phone]      04/30/23 2144   04/30/23 2037  CONSULT TO CALL ACT TEAM       Ordering Provider: Claria Crofts, MD  Provider:  (Not yet assigned)  Question:  Reason for Consult?  Answer:  psych   04/30/23 2037             Mode of Visit: Tele-visit Virtual Statement:TELE PSYCHIATRY ATTESTATION & CONSENT As the provider for this telehealth consult, I attest that I verified the patient's identity using two separate identifiers, introduced myself to the patient, provided my credentials, disclosed my location, and performed this encounter via a HIPAA-compliant, real-time, face-to-face, two-way, interactive audio and video platform and with the full consent and agreement of the patient (or guardian as applicable.) Patient physical location: [facility]. Telehealth provider physical location: home office in state of [place].   Video start time: 1:30 PM Video end time: 1:50 PM    Psychiatry Consult Evaluation  Service Date: May 10, 2023 LOS:  LOS: 0 days  Chief Complaint "I broke a window"  Primary Psychiatric Diagnoses  Depression, unspecified 2.  Borderline personality disorder 3.  IDD  Assessment  05/01/2023 note alternate provider: Emanie Bryan is a 28 year old female  with a history of major depressive disorder, adjustment disorder, PTSD, borderline personality disorder, conduct disorder, disruptive mood dysregulation disorder, intellectual disability who presents to the ED from her group home for the second time in 24 hours, the first time was for chest pain and then she reportedly went to her group home, took a hammer, broke a window and threatened to use glass to cut her wrist, currently on IVC. Psychiatry consulted for disposition recommendations. On evaluation, patient noted to be calm, cooperative, dysthymic, concrete, not appearing internally preoccupied, not responding to internal stimuli, alert and oriented x 4. Patient reports she broke a window because she missed her parents and had thoughts to cut herself to end her life. She endorses ongoing suicidal ideation with intent and plan to cut her wrist. Patient endorses depressed mood and hopelessness. She is unable to engage in safety planning at the time of evaluation. Patient's presentation is consistent with depression, unspecified complicated by IDD and history of borderline personality disorder. Patient endorses suicidal ideation with intent and plan. Patient is high risk for suicide due to depression, hopelessness, impulsivity, prior attempts.    05/10/2023: Sherry Bryan is a 28 y.o. female presented to [facility] ED on 04/30/2023 at 8:47 PM due to property destruction and threats of self-harm.  Patient seen for reassessment today as she was previously recommended for inpatient psychiatry on 05/01/2023 but has been treated in the ED due to not being  accepted for an inpatient psychiatry bed placement.  Per patient, she reports breaking a glass and threatening to cut herself with a piece of glass prior to admission, approximately 10 days ago.  She reports that she felt like she was trapped by the staff at her group home prior to property destruction and threats of self-harm.  Patient reports living at the group home,  named [facility] for the past 4 months and states that she is ready to go back, as she has friends at the group home.  Patient denies thoughts of self-harm or suicidal ideations today.  Patient has been free of agitation, aggression or property destruction since shortly after admission.  Medications were adjusted during ED stay.  Patient is compliant with medications and reports effectiveness for symptom management. Patient denies adverse medication effects.  Patient denies HI, AVH, delusional thought or paranoia.  Patient no longer meets criteria for inpatient psychiatry.  Patient is psychiatrically cleared at this time to return to group home.   Diagnoses:  Active Hospital problems: Depression unspecified, IDD, borderline personality disorder Medical history of seizure disorder  Plan   ## Psychiatric Medication Recommendations:  Continue current medication regimen  ## Medical Decision Making Capacity: Not specifically addressed in this encounter  ## Further Work-up:  -- Defer to ED P  -- most recent EKG on 05/01/2023 had QtC of 432 -- Pertinent labwork reviewed earlier this admission includes: CMP, CBC, EKG, UDS   ## Disposition:-- There are no psychiatric contraindications to discharge at this time  ## Behavioral / Environmental: - No specific recommendations at this time.     ## Safety and Observation Level:  - Based on my clinical evaluation, I estimate the patient to be at low risk of self harm in the current setting. - At this time, we recommend routine. This decision is based on my review of the chart including patient's history and current presentation, interview of the patient, mental status examination, and consideration of suicide risk including evaluating suicidal ideation, plan, intent, suicidal or self-harm behaviors, risk factors, and protective factors. This judgment is based on our ability to directly address suicide risk, implement suicide prevention strategies,  and develop a safety plan while the patient is in the clinical setting. Please contact our team if there is a concern that risk level has changed.  CSSR Risk Category:C-SSRS RISK CATEGORY: No Risk  Suicide Risk Assessment: Patient has following modifiable risk factors for suicide: None, which we are addressing by psychiatrically clear patient at this time. Patient has following non-modifiable or demographic risk factors for suicide: history of self harm behavior Patient has the following protective factors against suicide: Access to outpatient mental health care  Thank you for this consult request. Recommendations have been communicated to the primary team.  We will psychiatrically clear patient at this time to return to group home with 24-hour supervision at this time.   Monroe Antigua, NP       History of Present Illness  Relevant Aspects of Hospital ED Course:  Admitted on 04/30/2023 for property destruction and threats of self-harm.   Patient Report:  I broke a window"  Psych ROS:  Depression: Yes, patient has a history of depression.  Patient is currently reporting good mood Anxiety: Denies Mania (lifetime and current): Denies Psychosis: (lifetime and current): Denies    Review of Systems  Psychiatric/Behavioral:  Negative for depression, hallucinations, memory loss, substance abuse and suicidal ideas. The patient is not nervous/anxious and does not have insomnia.  All other systems reviewed and are negative.    Psychiatric and Social History  Psychiatric History:  Information collected from patient and ED treatment team  Prev Dx/Sx: MDD, disruptive mood dysregulation disorder, IDD, borderline personality disorder, conduct disorder with adolescent onset, bipolar depression Current Psych Provider: Psychiatric provider contracted through group home Home Meds (current): Trazodone  300 mg at bedtime, risperidone  1 mg twice daily, prazosin  2 mg at bedtime, Zyprexa  10 mg 3 times  daily as needed for agitation, Ativan  1 mg 3 times daily as needed for anxiety, Prozac  40 mg daily Depakote  DR 500 mg 3 times daily Previous Med Trials:  Therapy: Yes.  In the past.  Prior Psych Hospitalization: Multiple psychiatric ED visits Prior Self Harm: Yes.  Patient reports in 2015 suicide attempt Prior Violence: Denies  Family Psych History: Denies Family Hx suicide: Denies  Social History:  Developmental Hx: IDD Educational Hx: Unknown Occupational Hx: Unemployed Legal Hx: Denies Living Situation: Group home living Spiritual Hx: Yes Access to weapons/lethal means: No  Substance History Alcohol: No Type of alcohol NA Last Drink NA Number of drinks per day NA History of alcohol withdrawal seizures denies History of DT's denies Tobacco: Denies Illicit drugs: Denies Prescription drug abuse: Denies Rehab hx: Denies  Exam Findings  Physical Exam: No abnormalities observed Vital Signs:  Temp:  [97.9 F (36.6 C)-98 F (36.7 C)] 98 F (36.7 C) (04/30 1139) Pulse Rate:  [99-105] 99 (04/30 1139) Resp:  [16-18] 16 (04/30 1139) BP: (120-125)/(69-77) 125/69 (04/30 1139) SpO2:  [98 %-99 %] 99 % (04/30 1139) Blood pressure 125/69, pulse 99, temperature 98 F (36.7 C), temperature source Oral, resp. rate 16, height 5\' 4"  (1.626 m), SpO2 99%. Body mass index is 64.37 kg/m.  Physical Exam Vitals and nursing note reviewed.  Constitutional:      Appearance: Normal appearance.  Neurological:     General: No focal deficit present.     Mental Status: She is alert and oriented to person, place, and time.  Psychiatric:        Mood and Affect: Mood normal.        Behavior: Behavior normal.        Thought Content: Thought content normal.        Judgment: Judgment normal.     Mental Status Exam: General Appearance: Fairly Groomed  Orientation:  Full (Time, Place, and Person)  Memory:  Immediate;   Good Recent;   Good Remote;   Good  Concentration:  Concentration: Good   Recall:  Good  Attention  Good  Eye Contact:  Good  Speech:  Clear and Coherent  Language:  Good  Volume:  Normal  Mood: Euthymic  Affect:  Appropriate and Congruent  Thought Process:  Coherent  Thought Content:  Logical  Suicidal Thoughts:  No  Homicidal Thoughts:  No  Judgement:  Good  Insight:  Fair  Psychomotor Activity:  Normal  Akathisia:  NA  Fund of Knowledge:  Fair      Assets:  Manufacturing systems engineer  Cognition:  WNL  ADL's:  Intact  AIMS (if indicated):        Other History   These have been pulled in through the EMR, reviewed, and updated if appropriate.  Family History:  The patient's family history is not on file.  Medical History: Past Medical History:  Diagnosis Date   Borderline personality disorder (HCC)    Constipation 05/15/2015   Depression    DMDD (disruptive mood dysregulation disorder) (HCC) 05/15/2015   History  of seasonal allergies 05/15/2015   Hx of gastroesophageal reflux (GERD) 05/15/2015   Hx of seizure disorder 05/15/2015   Intellectual disability 05/22/2015   PTSD (post-traumatic stress disorder)    Seizures (HCC)    last one in 2015   Suicidal ideation     Surgical History: History reviewed. No pertinent surgical history.   Medications:   Current Facility-Administered Medications:    apixaban  (ELIQUIS ) tablet 5 mg, 5 mg, Oral, BID, Bradler, Evan K, MD, 5 mg at 05/10/23 1121   divalproex  (DEPAKOTE ) DR tablet 500 mg, 500 mg, Oral, TID, Alicia Inoue, Sophie C, MD, 500 mg at 05/10/23 1121   FLUoxetine  (PROZAC ) capsule 40 mg, 40 mg, Oral, Daily, Alicia Inoue, Sophie C, MD, 40 mg at 05/10/23 1121   hydrocortisone  cream 1 %, , Topical, BID, Siadecki, Sebastian, MD, Given at 05/08/23 9983   levETIRAcetam  (KEPPRA ) tablet 500 mg, 500 mg, Oral, BID, Bradler, Evan K, MD, 500 mg at 05/10/23 1121   levothyroxine  (SYNTHROID ) tablet 50 mcg, 50 mcg, Oral, QAC breakfast, Bradler, Evan K, MD, 50 mcg at 05/10/23 1121   LORazepam  (ATIVAN ) tablet 1 mg, 1 mg, Oral, TID  PRN, Feller, Sophie C, MD   OLANZapine  zydis (ZYPREXA ) disintegrating tablet 10 mg, 10 mg, Oral, TID PRN, Alicia Inoue, Sophie C, MD   prazosin  (MINIPRESS ) capsule 2 mg, 2 mg, Oral, QHS, Feller, Sophie C, MD, 2 mg at 05/09/23 2111   risperiDONE  (RISPERDAL ) tablet 1 mg, 1 mg, Oral, BID, Feller, Sophie C, MD, 1 mg at 05/10/23 1121   traZODone  (DESYREL ) tablet 300 mg, 300 mg, Oral, QHS, Feller, Sophie C, MD, 300 mg at 05/09/23 2111  Current Outpatient Medications:    apixaban  (ELIQUIS ) 5 MG TABS tablet, Take 5 mg by mouth 2 (two) times daily., Disp: , Rfl:    divalproex  (DEPAKOTE ) 500 MG DR tablet, Take 500 mg by mouth 3 (three) times daily., Disp: , Rfl:    docusate sodium  (COLACE) 100 MG capsule, Take 100 mg by mouth 2 (two) times daily. , Disp: , Rfl:    escitalopram  (LEXAPRO ) 10 MG tablet, Take 10 mg by mouth daily., Disp: , Rfl:    FLUoxetine  (PROZAC ) 40 MG capsule, Take 40 mg by mouth daily., Disp: , Rfl:    fluticasone  (FLONASE ) 50 MCG/ACT nasal spray, Place 1 spray into the nose 2 (two) times daily as needed for allergies., Disp: , Rfl:    folic acid  (FOLVITE ) 1 MG tablet, Take 1 mg by mouth daily., Disp: , Rfl:    hydrochlorothiazide  (HYDRODIURIL ) 25 MG tablet, Take 25 mg by mouth daily., Disp: , Rfl:    levETIRAcetam  (KEPPRA ) 500 MG tablet, Take 500 mg by mouth 2 (two) times daily., Disp: , Rfl:    levothyroxine  (SYNTHROID ) 50 MCG tablet, Take 50 mcg by mouth daily before breakfast., Disp: , Rfl:    loratadine  (CLARITIN ) 10 MG tablet, Take 10 mg by mouth daily., Disp: , Rfl:    LORazepam  (ATIVAN ) 1 MG tablet, Take 1 mg by mouth 3 (three) times daily., Disp: , Rfl:    metoprolol succinate (TOPROL-XL) 50 MG 24 hr tablet, Take 50 mg by mouth daily., Disp: , Rfl:    Multiple Vitamins-Minerals (MULTIVITAMIN ADULTS) TABS, Take 1 tablet by mouth daily., Disp: , Rfl:    nystatin  (MYCOSTATIN /NYSTOP ) powder, Apply 1 Bottle topically at bedtime as needed (RASH)., Disp: , Rfl:    omeprazole (PRILOSEC) 40  MG capsule, Take 40 mg by mouth daily., Disp: , Rfl:    prazosin  (MINIPRESS ) 2 MG capsule,  Take 2 mg by mouth at bedtime., Disp: , Rfl:    risperiDONE  (RISPERDAL ) 1 MG tablet, Take 1 mg by mouth 2 (two) times daily., Disp: , Rfl:    trazodone  (DESYREL ) 300 MG tablet, Take 300 mg by mouth at bedtime., Disp: , Rfl:    VENTOLIN HFA 108 (90 Base) MCG/ACT inhaler, Inhale 2 puffs into the lungs every 6 (six) hours as needed for wheezing or shortness of breath., Disp: , Rfl:    hydrOXYzine  (ATARAX ) 50 MG tablet, Take 50 mg by mouth 3 (three) times daily as needed. (Patient not taking: Reported on 02/15/2023), Disp: , Rfl:    meclizine (ANTIVERT) 25 MG tablet, Take 25 mg by mouth 3 (three) times daily as needed for nausea., Disp: , Rfl:   Allergies: Allergies  Allergen Reactions   Ritalin [Methylphenidate Hcl] Other (See Comments)    seizures   Abilify [Aripiprazole] Other (See Comments)    Shaking or tremors    Monroe Antigua, NP

## 2023-05-10 NOTE — ED Notes (Signed)
 RN attempted to call legal guardian Lorrin Rollings for permission to discharge pt and arrange transport back to group home. Guardian did not answer after 2 attempts.

## 2023-05-10 NOTE — Discharge Instructions (Signed)
You have been seen in the emergency department for a  psychiatric concern. You have been evaluated both medically as well as psychiatrically. Please follow-up with your outpatient resources provided. Return to the emergency department for any worsening symptoms, or any thoughts of hurting yourself or anyone else so that we may attempt to help you. 

## 2023-05-10 NOTE — ED Notes (Signed)
 Hospital meal provided, pt tolerated w/o complaints.  Waste discarded appropriately.

## 2023-05-10 NOTE — ED Provider Notes (Addendum)
   [facility] Observation Note   ----------------------------------------- 8:24 AM on 05/10/2023 -----------------------------------------  Sherry Bryan is a 28 y.o. female currently in the emergency department, pending psychiatric placement.  No acute events since last update.  Recent Vitals   Most recent vital signs: Vitals:   05/09/23 1040 05/09/23 2014  BP: 116/71 120/77  Pulse: (!) 103 (!) 105  Resp: 18 18  Temp: 98.2 F (36.8 C) 97.9 F (36.6 C)  SpO2: 96% 98%    ED Results / Procedures / Treatments   Labs (all labs ordered are listed, but only abnormal results are displayed) Labs Reviewed  CBC - Abnormal; Notable for the following components:      Result Value   Hemoglobin 11.3 (*)    MCV 76.8 (*)    MCH 22.6 (*)    MCHC 29.4 (*)    RDW 20.7 (*)    All other components within normal limits  POC URINE PREG, ED    MEDICATIONS ORDERED IN ED: Medications  divalproex  (DEPAKOTE ) DR tablet 500 mg (500 mg Oral Given 05/09/23 2111)  FLUoxetine  (PROZAC ) capsule 40 mg (40 mg Oral Given 05/09/23 1044)  LORazepam  (ATIVAN ) tablet 1 mg (has no administration in time range)  risperiDONE  (RISPERDAL ) tablet 1 mg (1 mg Oral Given 05/09/23 2111)  traZODone  (DESYREL ) tablet 300 mg (300 mg Oral Given 05/09/23 2111)  prazosin  (MINIPRESS ) capsule 2 mg (2 mg Oral Given 05/09/23 2111)  OLANZapine  zydis (ZYPREXA ) disintegrating tablet 10 mg (has no administration in time range)  hydrocortisone  cream 1 % ( Topical Patient Refused/Not Given 05/09/23 2123)  nystatin  (MYCOSTATIN /NYSTOP ) topical powder ( Topical Given 05/08/23 0953)  apixaban  (ELIQUIS ) tablet 5 mg (5 mg Oral Given 05/09/23 2111)  levothyroxine  (SYNTHROID ) tablet 50 mcg (50 mcg Oral Patient Refused/Not Given 05/09/23 1055)  levETIRAcetam  (KEPPRA ) tablet 500 mg (500 mg Oral Given 05/09/23 2111)     ED Plan   Patient has been referred out to multiple inpatient psychiatric facilities.  Continue to  await for acceptance to an appropriate facility.  Remains under IVC with ongoing psychiatric care.    Ruth Cove, MD 05/10/23 8657    Ruth Cove, MD 05/10/23 [phone]

## 2023-05-10 NOTE — ED Notes (Signed)
 Called legal guardian Rollings,Lorrin [phone] to inform of pt reassessment and recommendation to discharge.

## 2023-05-11 NOTE — ED Notes (Signed)
 Pt Dinner provided at bedside

## 2023-05-11 NOTE — ED Notes (Signed)
 RN attempted to call legal guardian Lorrin Rollings for permission to discharge pt and arrange transport back to group home. Guardian did not answer after 2 attempts.

## 2023-05-11 NOTE — ED Notes (Signed)
 Discussed plan of care with Legal Guardian. Requested to have D/C instructions faxed to them. Fax number [phone] attn Marice Shock.

## 2023-05-11 NOTE — ED Notes (Signed)
Pt given snack at this time  

## 2023-05-11 NOTE — ED Notes (Signed)
 Spoke with group home staff. Pt to be picked up before 3pm by staff.

## 2023-05-11 NOTE — ED Notes (Signed)
 Labwork and ER discharge instructions faxed to group home.  Group home states they will pick up patient tomorrow    NP at group home needs to look at labs to order patient's medications per [place] at group home.

## 2023-05-11 NOTE — ED Notes (Signed)
 IVC/Pending Placement

## 2023-05-11 NOTE — ED Provider Notes (Signed)
   [facility] Observation Note   ----------------------------------------- 9:20 AM on 05/11/2023 -----------------------------------------  Lany Devaul is a 28 y.o. female currently in the emergency department.  Psychiatry has placed a note recommending discontinuing the IVC, which I will complete this morning.  They recommend discharge home.  We have attempted to reach legal guardian multiple times but have been unsuccessful so far.  Recent Vitals   Most recent vital signs: Vitals:   05/10/23 1139 05/10/23 1944  BP: 125/69 (!) 92/37  Pulse: 99 87  Resp: 16 16  Temp: 98 F (36.7 C) 97.7 F (36.5 C)  SpO2: 99% 98%    ED Results / Procedures / Treatments   Labs (all labs ordered are listed, but only abnormal results are displayed) Labs Reviewed  CBC - Abnormal; Notable for the following components:      Result Value   Hemoglobin 11.3 (*)    MCV 76.8 (*)    MCH 22.6 (*)    MCHC 29.4 (*)    RDW 20.7 (*)    All other components within normal limits  POC URINE PREG, ED    MEDICATIONS ORDERED IN ED: Medications  divalproex  (DEPAKOTE ) DR tablet 500 mg (500 mg Oral Given 05/10/23 2131)  FLUoxetine  (PROZAC ) capsule 40 mg (40 mg Oral Given 05/10/23 1121)  LORazepam  (ATIVAN ) tablet 1 mg (has no administration in time range)  risperiDONE  (RISPERDAL ) tablet 1 mg (1 mg Oral Given 05/10/23 2131)  traZODone  (DESYREL ) tablet 300 mg (300 mg Oral Given 05/10/23 2130)  prazosin  (MINIPRESS ) capsule 2 mg (2 mg Oral Given 05/10/23 2131)  OLANZapine  zydis (ZYPREXA ) disintegrating tablet 10 mg (has no administration in time range)  hydrocortisone  cream 1 % ( Topical Not Given 05/10/23 2139)  nystatin  (MYCOSTATIN /NYSTOP ) topical powder ( Topical Given 05/08/23 0953)  apixaban  (ELIQUIS ) tablet 5 mg (5 mg Oral Given 05/10/23 2131)  levothyroxine  (SYNTHROID ) tablet 50 mcg (50 mcg Oral Given 05/10/23 1121)  levETIRAcetam  (KEPPRA ) tablet 500 mg (500 mg Oral Given 05/10/23 2131)      ED Plan   Patient ready for discharge from a medical and psychiatric perspective.  Awaiting legal guardian to pick up.    Ruth Cove, MD 05/11/23 [phone]

## 2023-05-11 NOTE — ED Notes (Signed)
 RN attempted to call legal guardian, no answer.

## 2023-05-11 NOTE — ED Notes (Addendum)
 This nurse spoke with group home and they requested to have discharge information sent to either [email or web address] or fax [phone] asking if pt can be picked up tomorrow instead of today so the facility has time to prepare for pt arrival.

## 2023-05-12 NOTE — ED Notes (Signed)
 VOL see note  facility could not take patient back due to her room at facility is unsafe will be fixed Monday  Sherry Bryan

## 2023-05-12 NOTE — ED Notes (Addendum)
 Per nursing secretary, Luanne, Sherry Bryan from group home called and stated that they are unable to accept pt until Monday d/t room not being ready.

## 2023-05-12 NOTE — ED Provider Notes (Signed)
 Emergency Medicine Observation Re-evaluation Note  Sherry Bryan is a 28 y.o. female, seen on rounds today.  Pt initially presented to the ED for complaints of Psychiatric Evaluation  Currently, the patient is no acute distress. No issues per bhu nurse   Physical Exam  Blood pressure 127/82, pulse 100, temperature 97.8 F (36.6 C), temperature source Oral, resp. rate 20, height 5\' 4"  (1.626 m), SpO2 94%.  Physical Exam General: No apparent distress Pulm: Normal WOB Psych: resting     ED Course / MDM     I have reviewed the labs performed to date as well as medications administered while in observation.   Plan   Current plan is to continue to wait for dc to group home. Patient is not under full IVC at this time.   Lubertha Rush, MD 05/12/23 [phone]

## 2023-05-12 NOTE — ED Notes (Signed)
 Labwork and ER discharge instructions faxed to group home /group home states they will pick up patient today/ NP @ Group Home needs to look @ labs to order patient's medications per latonya @ group home

## 2023-05-12 NOTE — ED Notes (Signed)
 Pt ambulated independently with steady gait to restroom & back to treatment room. No complaints at this time. Denies any needs.

## 2023-05-13 NOTE — ED Notes (Signed)
 Garnitta @ [phone] called from group home and stated "Please don't report us  to the state, we ain't trying to abandon anybody, can we pick her up at three"  Staff stated that would be ok. Staff informed EDP of plan for DC

## 2023-05-13 NOTE — ED Notes (Signed)
 VOL see note facility could not take patient back due to her room at facility is unsafe will be fixed Monday Sherry Bryan

## 2023-05-13 NOTE — ED Notes (Signed)
 Linda Repress, [place] [facility]- returned call.  Staff explained timeline of pt events, staff conveyed again that this patient has been ready to dc since 04/30 and the [facility] seems to be giving a run around

## 2023-05-13 NOTE — ED Notes (Signed)
 This tech asked pt if  she wants to take a shower and she said not right now.

## 2023-05-13 NOTE — ED Notes (Signed)
 Pt takes shower , wash hair and changed scrubs.

## 2023-05-13 NOTE — ED Notes (Signed)
 Engineer, manufacturing systems with [employer] [facility] c/b to make sure [facility] had made contact with [facility]. Ms Annabell Key stated that if the [facility] had not picked up Sherry Bryan by 1500 to call her back @ [phone] to notify DSS of abandonment by [facility] of this pt

## 2023-05-13 NOTE — ED Notes (Signed)
 Called [place] [facility] LM to have weekend on-call Supervisor return call for pick up of patient

## 2023-05-13 NOTE — ED Provider Notes (Signed)
 Emergency Medicine Observation Re-evaluation Note  Sherry Bryan is a 28 y.o. female, seen on rounds today.  Pt initially presented to the ED for complaints of Psychiatric Evaluation  Currently, the patient is is no acute distress. Denies any concerns at this time.  Physical Exam  Blood pressure 109/75, pulse 94, temperature 99.4 F (37.4 C), temperature source Oral, resp. rate 15, height 5\' 4"  (1.626 m), SpO2 94%.  Physical Exam: General: No apparent distress Pulm: Normal WOB Neuro: Moving all extremities Psych: Resting comfortably     ED Course / MDM     I have reviewed the labs performed to date as well as medications administered while in observation.  Recent changes in the last 24 hours include: No acute events overnight.  Plan   Current plan: Patient awaiting group home to come pick her up.  Multiple attempts over the past couple of days.  Will discuss with DSS.      Viviano Ground, MD 05/13/23 [phone]

## 2023-05-13 NOTE — ED Notes (Signed)
 Pt is A/Ox 4, Ms Holtman declines any SI/HI stated that she does not currently have any A/V hallucinations.  Discharge instructions reviewed with [facility] rep, who verbalized understanding.  All Belongings accounted for and returned to PT.  Pt left ambulatory via POV driven by [facility] rep.  Pt LG on call for [place] [facility] informed and agreeable.

## 2023-05-21 ENCOUNTER — Other Ambulatory Visit: Payer: Self-pay

## 2023-05-21 ENCOUNTER — Emergency Department
Admission: EM | Admit: 2023-05-21 | Discharge: 2023-05-22 | Disposition: A | Discharge status: Home / Self Care | Payer: MEDICAID | Attending: Emergency Medicine | Admitting: Emergency Medicine

## 2023-05-21 DIAGNOSIS — R45850 Homicidal ideations: Secondary | ICD-10-CM | POA: Diagnosis not present

## 2023-05-21 DIAGNOSIS — S61511A Laceration without foreign body of right wrist, initial encounter: Secondary | ICD-10-CM | POA: Insufficient documentation

## 2023-05-21 DIAGNOSIS — R45851 Suicidal ideations: Secondary | ICD-10-CM | POA: Insufficient documentation

## 2023-05-21 DIAGNOSIS — F79 Unspecified intellectual disabilities: Secondary | ICD-10-CM | POA: Insufficient documentation

## 2023-05-21 DIAGNOSIS — F603 Borderline personality disorder: Secondary | ICD-10-CM | POA: Insufficient documentation

## 2023-05-21 DIAGNOSIS — W25XXXA Contact with sharp glass, initial encounter: Secondary | ICD-10-CM | POA: Insufficient documentation

## 2023-05-21 DIAGNOSIS — F331 Major depressive disorder, recurrent, moderate: Secondary | ICD-10-CM | POA: Diagnosis not present

## 2023-05-21 LAB — COMPREHENSIVE METABOLIC PANEL WITH GFR
ALT: 14 U/L (ref 0–44)
AST: 15 U/L (ref 15–41)
Albumin: 3.1 g/dL — ABNORMAL LOW (ref 3.5–5.0)
Alkaline Phosphatase: 55 U/L (ref 38–126)
Anion gap: 9 (ref 5–15)
BUN: 12 mg/dL (ref 6–20)
CO2: 23 mmol/L (ref 22–32)
Calcium: 9.1 mg/dL (ref 8.9–10.3)
Chloride: 103 mmol/L (ref 98–111)
Creatinine, Ser: 0.91 mg/dL (ref 0.44–1.00)
GFR, Estimated: >60 mL/min (ref >60)
Glucose, Bld: 136 mg/dL — ABNORMAL HIGH (ref 70–99)
Potassium: 3.4 mmol/L — ABNORMAL LOW (ref 3.5–5.1)
Sodium: 135 mmol/L (ref 135–145)
Total Bilirubin: 0.5 mg/dL (ref 0.0–1.2)
Total Protein: 7.2 g/dL (ref 6.5–8.1)

## 2023-05-21 LAB — CBC
HCT: 40.6 % (ref 36.0–46.0)
Hemoglobin: 12 g/dL (ref 12.0–15.0)
MCH: 22.7 pg — ABNORMAL LOW (ref 26.0–34.0)
MCHC: 29.6 g/dL — ABNORMAL LOW (ref 30.0–36.0)
MCV: 76.9 fL — ABNORMAL LOW (ref 80.0–100.0)
Platelets: 264 10*3/uL (ref 150–400)
RBC: 5.28 MIL/uL — ABNORMAL HIGH (ref 3.87–5.11)
RDW: 19.4 % — ABNORMAL HIGH (ref 11.5–15.5)
WBC: 9.3 10*3/uL (ref 4.0–10.5)
nRBC: 0 % (ref 0.0–0.2)

## 2023-05-21 LAB — ETHANOL: Alcohol, Ethyl (B): <15 mg/dL (ref <15)

## 2023-05-21 NOTE — ED Triage Notes (Signed)
 BIB [facility] from group home with CC of chronic SI. Denies intention of acting on plan. States cut self with glass today. Light pink abrasions barely visible on R anterior wrist.  Karyl Paget [phone] with [facility] DSS as pt states this is legal guardian - no answer - left voicemail with call back number.  [facility]  [address] [place] point of contact [phone]
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
## 2023-05-21 NOTE — ED Notes (Signed)
 Pt was given snack at this time.

## 2023-05-21 NOTE — ED Notes (Signed)
 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

## 2023-05-21 NOTE — ED Provider Notes (Addendum)
 [facility] Provider Note    Event Date/Time   First MD Initiated Contact with Patient 05/21/23 1929     (approximate)   History   Psychiatric Evaluation   HPI  Sherry Bryan is a 28 year old female presenting to the ER for evaluation of suicidal ideation.  Patient reports chronic suicidal ideation, reports that she thinks this worsened about a week ago.  Broke window in her group home and cut herself with glass today. Denies HI.  Denies AVH.      Physical Exam   Triage Vital Signs: ED Triage Vitals  Encounter Vitals Group     BP 05/21/23 1914 125/61     Systolic BP Percentile --      Diastolic BP Percentile --      Pulse Rate 05/21/23 1914 93     Resp 05/21/23 1914 17     Temp 05/21/23 1914 (!) 97.5 F (36.4 C)     Temp Source 05/21/23 1914 Oral     SpO2 05/21/23 1914 95 %     Weight 05/21/23 1915 (!) 374 lb 12.5 oz (170 kg)     Height 05/21/23 1915 5\' 4"  (1.626 m)     Head Circumference --      Peak Flow --      Pain Score 05/21/23 1914 0     Pain Loc --      Pain Education --      Exclude from Growth Chart --     Most recent vital signs: Vitals:   05/21/23 1914  BP: 125/61  Pulse: 93  Resp: 17  Temp: (!) 97.5 F (36.4 C)  SpO2: 95%     General: Awake, interactive  CV:  Regular rate, good peripheral perfusion.  Resp:  Unlabored respirations.  Abd:  Nondistended.  Neuro:  Symmetric facial movement, fluid speech MSK:  Multiple very superficial horizontal lacerations over the wrist   ED Results / Procedures / Treatments   Labs (all labs ordered are listed, but only abnormal results are displayed) Labs Reviewed  COMPREHENSIVE METABOLIC PANEL WITH GFR - Abnormal; Notable for the following components:      Result Value   Potassium 3.4 (*)    Glucose, Bld 136 (*)    Albumin 3.1 (*)    All other components within normal limits  CBC - Abnormal; Notable for the following components:   RBC 5.28 (*)    MCV 76.9 (*)     MCH 22.7 (*)    MCHC 29.6 (*)    RDW 19.4 (*)    All other components within normal limits  ETHANOL  URINE DRUG SCREEN, QUALITATIVE ([facility] ONLY)  POC URINE PREG, ED     EKG EKG independently reviewed interpreted by myself (ER attending) demonstrates:    RADIOLOGY Imaging independently reviewed and interpreted by myself demonstrates:   Formal Radiology Read:  No results found.  PROCEDURES:  Critical Care performed: No  Procedures   MEDICATIONS ORDERED IN ED: Medications - No data to display   IMPRESSION / MDM / ASSESSMENT AND PLAN / ED COURSE  I reviewed the triage vital signs and the nursing notes.  Differential diagnosis includes, but is not limited to, primary psychiatric disorder, substance-induced mood disorder, acute stress response  Patient's presentation is most consistent with acute presentation with potential threat to life or bodily function.  28 year old female presenting with suicidal ideation, history of multiple visits for similar.  No clear or active plan.  Will hold off on IVC.  Will consult psychiatry and TTS.  The patient has been placed in psychiatric observation due to the need to provide a safe environment for the patient while obtaining psychiatric consultation and evaluation, as well as ongoing medical and medication management to treat the patient's condition.  The patient has not been placed under full IVC at this time.  1158 Notified by Dr. Alicia Inoue with psychiatry that she had evaluated the patient.  She does not feel that patient meets criteria for inpatient hospitalization and does recommend discharge.  Will place pending discharge order for when patient's group home can be contacted for discharge.      FINAL CLINICAL IMPRESSION(S) / ED DIAGNOSES   Final diagnoses:  Suicidal ideation     Rx / DC Orders   ED Discharge Orders     None        Note:  This document was prepared using Dragon voice recognition software and may  include unintentional dictation errors.   Claria Crofts, MD 05/21/23 2351    Claria Crofts, MD 05/22/23 0002

## 2023-05-21 NOTE — ED Notes (Signed)
 Assisted other NT change pt out into scrubs.

## 2023-05-21 NOTE — ED Notes (Signed)
 Pt to interview room with ED tech for [facility] psych consult video appt.

## 2023-05-22 ENCOUNTER — Emergency Department (HOSPITAL_COMMUNITY)
Admission: EM | Admit: 2023-05-22 | Discharge: 2023-05-24 | Disposition: A | Discharge status: Home / Self Care | Payer: MEDICAID | Source: Home / Self Care | Attending: Emergency Medicine | Admitting: Emergency Medicine

## 2023-05-22 ENCOUNTER — Encounter: Payer: Self-pay | Admitting: Emergency Medicine

## 2023-05-22 DIAGNOSIS — R45851 Suicidal ideations: Secondary | ICD-10-CM | POA: Diagnosis not present

## 2023-05-22 DIAGNOSIS — F331 Major depressive disorder, recurrent, moderate: Secondary | ICD-10-CM | POA: Diagnosis not present

## 2023-05-22 DIAGNOSIS — R45850 Homicidal ideations: Secondary | ICD-10-CM | POA: Diagnosis not present

## 2023-05-22 DIAGNOSIS — F603 Borderline personality disorder: Secondary | ICD-10-CM | POA: Diagnosis not present
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
## 2023-05-22 NOTE — ED Triage Notes (Signed)
 Patient belongings:   Sherry Bryan shirt 2 blue slides Black pants Flower printed jacket

## 2023-05-22 NOTE — ED Notes (Signed)
 RN called, no answer.  Sherry Bryan Other Emergency Contact [phone]

## 2023-05-22 NOTE — ED Notes (Addendum)
 Pt alert, cooperative, appropriate for discharge. Parent/legal guardian, Amy at bedside of patient, voices understanding of discharge instructions and appropriate follow up if needed. Pt in NAD.

## 2023-05-22 NOTE — ED Notes (Signed)
 RN called the listed below contact, no answer.  [facility]  [address] [place] point of contact [phone]

## 2023-05-22 NOTE — ED Notes (Signed)
Vol pending consult 

## 2023-05-22 NOTE — ED Notes (Signed)
  RN called the listed below contact, no answer.   [facility]  [address] [place] point of contact [phone]
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
## 2023-05-22 NOTE — Discharge Instructions (Signed)
Return to the ER for new or worsening symptoms. °

## 2023-05-22 NOTE — ED Notes (Signed)
Snack given at this time

## 2023-05-22 NOTE — ED Triage Notes (Signed)
 Patient to ED via [facility]  for psych evaluation. Pt reports being SI/HI. Dc'd this AM for same. Pt reports that she also needs help with alcohol after having a "few sips for the first time the other day." Denies drinking today.  Ave Bobo, legal guardian with [facility] DSS called at this time with no answer.

## 2023-05-22 NOTE — ED Notes (Signed)
  RN attempted to contact listed below.  Lorrin Rollings Legal Guardian Emergency Contact [phone] [email or web address]

## 2023-05-22 NOTE — ED Provider Notes (Signed)
 [facility] Provider Note    Event Date/Time   First MD Initiated Contact with Patient 05/22/23 1726     (approximate)   History   Psychiatric Evaluation   HPI Sherry Bryan is a 28 y.o. female presenting today for suicidal ideation.  Patient has a history of chronic suicidal ideation but is here today for worsening SI as well as homicidal ideation.  Denies hallucinations.  No recent attempts of self-harm that she states with me but ED note yesterday states that she tried to cut herself with glass.  Denies any other acute symptoms at this time.  She did state that she had a few sips of alcohol the other day which was her first time and wants help with alcohol cessation.  Chart review: Frequently seen in the ED for similar symptoms.  Also seen by psychiatry yesterday.     Physical Exam   Triage Vital Signs: ED Triage Vitals [05/22/23 1709]  Encounter Vitals Group     BP (!) 112/46     Systolic BP Percentile      Diastolic BP Percentile      Pulse Rate (!) 116     Resp 17     Temp 97.6 F (36.4 C)     Temp Source Oral     SpO2 97 %     Weight 300 lb (136.1 kg)     Height 5\' 4"  (1.626 m)     Head Circumference      Peak Flow      Pain Score 0     Pain Loc      Pain Education      Exclude from Growth Chart     Most recent vital signs: Vitals:   05/22/23 1709  BP: (!) 112/46  Pulse: (!) 116  Resp: 17  Temp: 97.6 F (36.4 C)  SpO2: 97%   I have reviewed the vital signs. General:  Awake, alert, no acute distress. Head:  Normocephalic, Atraumatic. EENT:  PERRL, EOMI, Oral mucosa pink and moist, Neck is supple. Cardiovascular: Regular rate, 2+ distal pulses. Respiratory:  Normal respiratory effort, symmetrical expansion, no distress.   Extremities:  Moving all four extremities through full ROM without pain.   Neuro:  Alert and oriented.  Interacting appropriately.   Skin:  Warm, dry, no rash.   Psych: Appropriate affect.    ED  Results / Procedures / Treatments   Labs (all labs ordered are listed, but only abnormal results are displayed) Labs Reviewed - No data to display   EKG    RADIOLOGY    PROCEDURES:  Critical Care performed: No  Procedures   MEDICATIONS ORDERED IN ED: Medications - No data to display   IMPRESSION / MDM / ASSESSMENT AND PLAN / ED COURSE  I reviewed the triage vital signs and the nursing notes.                              Differential diagnosis includes, but is not limited to, chronic SI, primary psychiatric disorder, substance-induced disorder, stress response  Patient's presentation is most consistent with exacerbation of chronic illness.  Patient is a 28 year old female with chronic suicidal ideation and psychiatric concerns presenting today for SI and HI.  No clear or active plan.  No indication for IVC at this time.  Will consult psychiatry for further evaluation.  Medically cleared from yesterday and no indication to repeat blood work.  Patient  was signed out pending psychiatric evaluation.  The patient has been placed in psychiatric observation due to the need to provide a safe environment for the patient while obtaining psychiatric consultation and evaluation, as well as ongoing medical and medication management to treat the patient's condition.  The patient has not been placed under full IVC at this time.     FINAL CLINICAL IMPRESSION(S) / ED DIAGNOSES   Final diagnoses:  Suicidal ideation     Rx / DC Orders   ED Discharge Orders     None        Note:  This document was prepared using Dragon voice recognition software and may include unintentional dictation errors.   Kandee Orion, MD 05/22/23 2255

## 2023-05-22 NOTE — ED Notes (Signed)
 RN attempted to contact listed below, left VM  Lorrin Rollings Legal Guardian Emergency Contact [phone] [email or web address]

## 2023-05-22 NOTE — ED Notes (Signed)
 Pt. To BHU from ED ambulatory without difficulty, to room  BHU 2. Report from Amy RN. Pt. Is alert and oriented, warm and dry in no distress. Pt. Denies HI, and AVH. Patient states having SI without a plan. Patient verbally contracts for safety with this Clinical research associate. Pt. Calm and cooperative. Pt. Made aware of security cameras and Q15 minute rounds. Pt. Encouraged to let Nursing staff know of any concerns or needs.    ENVIRONMENTAL ASSESSMENT Potentially harmful objects out of patient reach: Yes.   Personal belongings secured: Yes.   Patient dressed in hospital provided attire only: Yes.   Plastic bags out of patient reach: Yes.   Patient care equipment (cords, cables, call bells, lines, and drains) shortened, removed, or accounted for: Yes.   Equipment and supplies removed from bottom of stretcher: Yes.   Potentially toxic materials out of patient reach: Yes.   Sharps container removed or out of patient reach: Yes.

## 2023-05-22 NOTE — ED Notes (Signed)
 Called the group home at [phone] and talked with Malachi  about pt being ready for discharge as soon as they have transportation. Malachi stated that if day shift calls him he will come pick up the pt.

## 2023-05-22 NOTE — BH Assessment (Signed)
 Comprehensive Clinical Assessment (CCA) Screening, Triage and Referral Note  05/22/2023 Sherry Bryan [id number] Recommendations for Services/Supports/Treatments: Disposition pending. Sherry Bryan is a 28 y.o., Caucasian, Not Hispanic or Latino ethnicity, ENGLISH speaking female with psych hx of Depression, DMDD, and IDD.  Per triage note: Patient to ED via [facility] for psych evaluation. Pt reports being SI/HI. Dc'd this AM for same. Pt reports that she also needs help with alcohol after having a "few sips for the first time the other day." Denies drinking today.  Pt was resting upon this writer's arrival. Pt presented with clear and coherent speech. Pt presented with a somber mood; affect was flat. Pt had a casual appearance. Pt explained that she'd presented to the hospital due to having thoughts of passive SI. Pt did not have a concrete plan of the ways in which she'd carry out her thoughts. Pt identified her stressors as worrying ab out her parents and whether or not she'll have a place to live. Pt admitted that she'd been confrontational with staff prior to her arrival. Pt endorsed having symptoms of depression for the last few weeks. Pd denied substance abuse. Pt denied current HI and AV/H.  Chief Complaint:  Chief Complaint  Patient presents with   Psychiatric Evaluation   Visit Diagnosis: 1.  Borderline personality disorder 2.  IDD  Patient Reported Information How did you hear about us ? Other (Comment)  What Is the Reason for Your Visit/Call Today? Pt BIB [facility] from [facility] with suicidal thoughts x 2 days. Pt states she had unprotected intercourse on 4/3, and it was her first time. Pt states it was consensual, but feels "frazzled" from the event, states she took a Plan B pill the next day but feels some remorse. Reports frequent nightmares and depression after mom passed last year. Reports hearing some voices telling her to end her life, no specific plan at this time.  Denies any substance  How Long Has This Been Causing You Problems? > than 6 months  What Do You Feel Would Help You the Most Today? Treatment for Depression or other mood problem   Have You Recently Had Any Thoughts About Hurting Yourself? Yes  Are You Planning to Commit Suicide/Harm Yourself At This time? No   Have you Recently Had Thoughts About Hurting Someone Sherry Bryan? No  Are You Planning to Harm Someone at This Time? No  Explanation: Pt continues to endorse SI with a plan to cut herself. Pt unable to contract for safety.   Have You Used Any Alcohol or Drugs in the Past 24 Hours? No  How Long Ago Did You Use Drugs or Alcohol? No data recorded What Did You Use and How Much? No data recorded  Do You Currently Have a Therapist/Psychiatrist? Yes  Name of Therapist/Psychiatrist: Patient gets her medications presecribed through her group home   Have You Been Recently Discharged From Any Office Practice or Programs? No  Explanation of Discharge From Practice/Program: No data recorded   CCA Screening Triage Referral Assessment Type of Contact: Face-to-Face  Telemedicine Service Delivery:   Is this Initial or Reassessment?   Date Telepsych consult ordered in [facility]:    Time Telepsych consult ordered in [facility]:    Location of Assessment: [facility] ED  Provider Location: [facility] ED    Collateral Involvement: None provided   Does Patient Have a Court Appointed Legal Guardian? No data recorded Name and Contact of Legal Guardian: No data recorded If Minor and Not Living with Parent(s), Who  has Custody? n/a  Is CPS involved or ever been involved? Never  Is APS involved or ever been involved? Never   Patient Determined To Be At Risk for Harm To Self or Others Based on Review of Patient Reported Information or Presenting Complaint? No  Method: Plan with intent and identified person  Availability of Means: No access or NA  Intent: Clearly intends on inflicting harm that could cause  death  Notification Required: No need or identified person  Additional Information for Danger to Others Potential: Previous attempts  Additional Comments for Danger to Others Potential: n/a  Are There Guns or Other Weapons in Your Home? No  Types of Guns/Weapons: n/a  Are These Weapons Safely Secured?                            No  Who Could Verify You Are Able To Have These Secured: n/a  Do You Have any Outstanding Charges, Pending Court Dates, Parole/Probation? None reported  Contacted To Inform of Risk of Harm To Self or Others: Other: Comment   Does Patient Present under Involuntary Commitment? No    [place] of Residence: [place]   Patient Currently Receiving the Following Services: Group Home; Medication Management   Determination of Need: Emergent (2 hours)   Options For Referral: Inpatient Hospitalization; ED Visit; Medication Management   Disposition Recommendation per psychiatric provider: Pending  Sherry Bryan Sherry Bryan, LCAS

## 2023-05-22 NOTE — ED Provider Notes (Signed)
 6:05 AM  Pt cleared by psychiatry.  Awaiting transport back to group home in the morning.   Armonie Staten, Clover Dao, DO 05/22/23 [phone]

## 2023-05-22 NOTE — ED Notes (Signed)
 Report given to Cherylann Corpus bhu nurse

## 2023-05-22 NOTE — ED Notes (Signed)
 Pt brought back in fer eval for SI/HI.  Pt left his am from ER with similar sx.  MD at bedside.  Orders canceled by MD.  Pt  alert.

## 2023-05-23 DIAGNOSIS — F331 Major depressive disorder, recurrent, moderate: Secondary | ICD-10-CM

## 2023-05-23 DIAGNOSIS — F603 Borderline personality disorder: Secondary | ICD-10-CM

## 2023-05-23 DIAGNOSIS — R45850 Homicidal ideations: Secondary | ICD-10-CM | POA: Diagnosis not present

## 2023-05-23 DIAGNOSIS — R45851 Suicidal ideations: Secondary | ICD-10-CM

## 2023-05-23 LAB — URINE DRUG SCREEN, QUALITATIVE (ARMC ONLY)
Amphetamines, Ur Screen: NOT DETECTED
Barbiturates, Ur Screen: NOT DETECTED
Benzodiazepine, Ur Scrn: POSITIVE — AB
Cannabinoid 50 Ng, Ur ~~LOC~~: NOT DETECTED
Cocaine Metabolite,Ur ~~LOC~~: NOT DETECTED
MDMA (Ecstasy)Ur Screen: NOT DETECTED
Methadone Scn, Ur: NOT DETECTED
Opiate, Ur Screen: NOT DETECTED
Phencyclidine (PCP) Ur S: NOT DETECTED
Tricyclic, Ur Screen: NOT DETECTED

## 2023-05-23 LAB — VALPROIC ACID LEVEL: Valproic Acid Lvl: 21 ug/mL — ABNORMAL LOW (ref 50–100)

## 2023-05-23 LAB — HCG, QUANTITATIVE, PREGNANCY: hCG, Beta Chain, Quant, S: <1 m[IU]/mL (ref <5)

## 2023-05-23 MED ORDER — TRAZODONE HCL 100 MG PO TABS: 300.0000 mg | ORAL_TABLET | Freq: Every day | ORAL | Status: AC

## 2023-05-23 MED ORDER — HYDROXYZINE HCL 25 MG PO TABS
25.0000 mg | ORAL_TABLET | Freq: Three times a day (TID) | ORAL | Status: DC | PRN
  Administered 2023-05-24: 25 mg via ORAL
  Filled 2023-05-23: qty 1

## 2023-05-23 NOTE — ED Notes (Signed)
 Pt came to staff and shared that she was worried about being able to return to her group home.  Pt stated that she feels as if she "gets blamed for everything wrong there".  Staff allowed pt time to verbalize thoughts and process feelings.  Cont to monitor as ordered

## 2023-05-23 NOTE — ED Provider Notes (Signed)
 Emergency Medicine Observation Re-evaluation Note  Sherry Bryan is a 28 y.o. female, seen on rounds today.  Pt initially presented to the ED for complaints of Psychiatric Evaluation Currently, the patient is resting without distress.  Physical Exam  BP (!) 112/46   Pulse (!) 116   Temp 97.6 F (36.4 C) (Oral)   Resp 17   Ht 5\' 4"  (1.626 m)   Wt 136.1 kg   LMP 05/01/2023 (Approximate)   SpO2 97%   BMI 51.49 kg/m  Physical Exam General: Resting without distress Cardiac:  Lungs:  Psych:   ED Course / MDM  EKG:   I have reviewed the labs performed to date as well as medications administered while in observation.  Recent changes in the last 24 hours include patient was evaluated by psychiatry with the recommendation to have reassessment this morning.  Plan  Current plan is for psychiatric reassessment.    Iver Marker, MD 05/23/23 [phone]

## 2023-05-23 NOTE — ED Notes (Signed)
 Sherry Bryan from Group Home presented to ED to review patient med list with patient's nurse/care team.  Staff alerted and plan was for staff to review list with Sherry Bryan once medication pass was complete.  Sherry Bryan unable to stay, phone number given for staff to reach out to review medication lists.  Phone number [phone].

## 2023-05-23 NOTE — ED Notes (Signed)
VOL/ Pending consult 

## 2023-05-23 NOTE — ED Notes (Signed)
Vol / consult pending 

## 2023-05-23 NOTE — ED Notes (Signed)
 Writer called contacts on chart with no answer.  Writer left HIPAA compliant message for return call.

## 2023-05-23 NOTE — ED Notes (Signed)
Pt provided with dinner tray and beverage 

## 2023-05-23 NOTE — ED Notes (Signed)
 Staff called telephone number left at front desk. No Answer HIPAA compliant message left

## 2023-05-24 DIAGNOSIS — F331 Major depressive disorder, recurrent, moderate: Secondary | ICD-10-CM | POA: Diagnosis not present

## 2023-05-24 NOTE — ED Notes (Signed)
 VOL/Pending Placement (Rec. Inpt Admit)

## 2023-05-24 NOTE — ED Notes (Signed)
 Pt given shower supplies. Shower Completed

## 2023-05-24 NOTE — ED Notes (Signed)
 During nursing assessment Mr Hainsworth was A/Ox 4 .  She  stated that she is not currently have thoughts or feelings of SI/HI.  Mr Carles reported that she is not currently having auditory or visual hallucinations.  Pt affect is congruent with situation , eye contact is good , speech is of normal rate and volume  with appropriate verbiage noted.  Staff addressed any feelings or concerns that have been brought up.  Medications were administered as ordered. Continue to monitor patient as ordered for any changes in behaviors and for continued safety.

## 2023-05-24 NOTE — ED Notes (Signed)
 Pt snack provided

## 2023-05-24 NOTE — Progress Notes (Signed)
   05/24/23 1445  Spiritual Encounters  Type of Visit Initial  Care provided to: Patient  Conversation partners present during encounter Nurse  Reason for visit Routine spiritual support  OnCall Visit No   Chaplain met patient while on the Unit.  Chaplain introduced [employer] and encouraged patient to reach out if she'd like to visit, pray or talk.  Patient was receptive.    Rev. Rana M. Nolon Baxter, M.Div. Chaplain Resident [facility]

## 2023-05-24 NOTE — ED Notes (Signed)
 Spoke to Pt's [facility] owner, Mrs Abigail Abler.  Ms Abigail Abler stated that they will be able to pick up Ms Sprehe by [phone].  They would also like to provide an updated medication list to have on file here at [facility].  Staff instructed [facility] owner to bring copy upon arrival.

## 2023-05-24 NOTE — ED Notes (Addendum)
 Spoke to guardian Representative from [facility] Lorrin Rollings @  [phone].  Verbal permission received to DC Ms Skyberg back to [facility]. Ms Angele Barbara was in the DSS supervisors office and spoke in detail about Ms Chynoweth and her hx of frequenting the ED since she was a

## 2023-05-24 NOTE — ED Notes (Addendum)
 Pt guardian called and stated she was stuck in traffic, she will be arriving at approx [phone]

## 2023-05-24 NOTE — ED Notes (Signed)
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
## 2023-05-24 NOTE — ED Provider Notes (Signed)
 Emergency Medicine Observation Re-evaluation Note  Sherry Bryan is a 28 y.o. female, seen on rounds today.  Pt initially presented to the ED for complaints of Psychiatric Evaluation Currently, the patient is resting.  Physical Exam  BP (!) 118/93 (BP Location: Right Arm)   Pulse (!) 102   Temp 98.3 F (36.8 C) (Oral)   Resp 20   Ht 5\' 4"  (1.626 m)   Wt 136.1 kg   LMP 05/01/2023 (Approximate)   SpO2 95%   BMI 51.49 kg/m  Physical Exam General: No distress  ED Course / MDM  EKG:   I have reviewed the labs performed to date as well as medications administered while in observation.  There have been no significant changes to the clinical status in last 24 hours.  Plan  Current plan is for psychiatric reassessment.    Lind Repine, MD 05/24/23 [phone]

## 2023-05-24 NOTE — ED Notes (Signed)
VOL/pending placement 

## 2023-05-24 NOTE — ED Notes (Signed)
 Pt Breakfast provided at bedside

## 2023-05-29 ENCOUNTER — Encounter: Payer: Self-pay | Admitting: Intensive Care

## 2023-05-29 ENCOUNTER — Emergency Department: Admission: EM | Admit: 2023-05-29 | Discharge: 2023-05-29 | Discharge status: Against Medical Advice | Payer: MEDICAID

## 2023-05-29 ENCOUNTER — Emergency Department
Admission: EM | Admit: 2023-05-29 | Discharge: 2023-05-29 | Disposition: A | Discharge status: Home / Self Care | Payer: MEDICAID | Attending: Emergency Medicine | Admitting: Emergency Medicine

## 2023-05-29 ENCOUNTER — Other Ambulatory Visit: Payer: Self-pay

## 2023-05-29 DIAGNOSIS — S91202A Unspecified open wound of left great toe with damage to nail, initial encounter: Secondary | ICD-10-CM | POA: Diagnosis not present

## 2023-05-29 DIAGNOSIS — R1013 Epigastric pain: Secondary | ICD-10-CM | POA: Diagnosis not present

## 2023-05-29 DIAGNOSIS — S91209A Unspecified open wound of unspecified toe(s) with damage to nail, initial encounter: Secondary | ICD-10-CM

## 2023-05-29 DIAGNOSIS — S99922A Unspecified injury of left foot, initial encounter: Secondary | ICD-10-CM | POA: Diagnosis present

## 2023-05-29 DIAGNOSIS — R197 Diarrhea, unspecified: Secondary | ICD-10-CM | POA: Diagnosis not present

## 2023-05-29 DIAGNOSIS — X58XXXA Exposure to other specified factors, initial encounter: Secondary | ICD-10-CM | POA: Diagnosis not present

## 2023-05-29 LAB — POC URINE PREG, ED: Preg Test, Ur: NEGATIVE

## 2023-05-29 LAB — COMPREHENSIVE METABOLIC PANEL WITH GFR
ALT: 13 U/L (ref 0–44)
AST: 11 U/L — ABNORMAL LOW (ref 15–41)
Albumin: 3.2 g/dL — ABNORMAL LOW (ref 3.5–5.0)
Alkaline Phosphatase: 60 U/L (ref 38–126)
Anion gap: 7 (ref 5–15)
BUN: 8 mg/dL (ref 6–20)
CO2: 25 mmol/L (ref 22–32)
Calcium: 8.7 mg/dL — ABNORMAL LOW (ref 8.9–10.3)
Chloride: 103 mmol/L (ref 98–111)
Creatinine, Ser: 0.93 mg/dL (ref 0.44–1.00)
GFR, Estimated: >60 mL/min (ref >60)
Glucose, Bld: 143 mg/dL — ABNORMAL HIGH (ref 70–99)
Potassium: 3.9 mmol/L (ref 3.5–5.1)
Sodium: 135 mmol/L (ref 135–145)
Total Bilirubin: 0.7 mg/dL (ref 0.0–1.2)
Total Protein: 7 g/dL (ref 6.5–8.1)

## 2023-05-29 LAB — CBC
HCT: 40.5 % (ref 36.0–46.0)
Hemoglobin: 11.8 g/dL — ABNORMAL LOW (ref 12.0–15.0)
MCH: 22.3 pg — ABNORMAL LOW (ref 26.0–34.0)
MCHC: 29.1 g/dL — ABNORMAL LOW (ref 30.0–36.0)
MCV: 76.6 fL — ABNORMAL LOW (ref 80.0–100.0)
Platelets: 243 10*3/uL (ref 150–400)
RBC: 5.29 MIL/uL — ABNORMAL HIGH (ref 3.87–5.11)
RDW: 19 % — ABNORMAL HIGH (ref 11.5–15.5)
WBC: 8 10*3/uL (ref 4.0–10.5)
nRBC: 0 % (ref 0.0–0.2)

## 2023-05-29 LAB — LIPASE, BLOOD: Lipase: 30 U/L (ref 11–51)

## 2023-05-29 LAB — URINALYSIS, ROUTINE W REFLEX MICROSCOPIC
Bilirubin Urine: NEGATIVE
Glucose, UA: NEGATIVE mg/dL
Hgb urine dipstick: NEGATIVE
Ketones, ur: NEGATIVE mg/dL
Leukocytes,Ua: NEGATIVE
Nitrite: NEGATIVE
Protein, ur: NEGATIVE mg/dL
Specific Gravity, Urine: 1.01 (ref 1.005–1.030)
pH: 6 (ref 5.0–8.0)

## 2023-05-29 MED ORDER — LOPERAMIDE HCL 2 MG PO TABS
2.0000 mg | ORAL_TABLET | Freq: Four times a day (QID) | ORAL | 0 refills | Status: AC | PRN
Start: 2023-05-29 — End: ?

## 2023-05-29 MED ORDER — BACITRACIN ZINC 500 UNIT/GM EX OINT
TOPICAL_OINTMENT | Freq: Once | CUTANEOUS | Status: AC
  Filled 2023-05-29: qty 0.9

## 2023-05-29 MED ORDER — BACITRACIN 500 UNIT/GM EX OINT: 1.0000 | TOPICAL_OINTMENT | Freq: Two times a day (BID) | CUTANEOUS | 0 refills | Status: DC

## 2023-05-29 NOTE — ED Provider Notes (Signed)
 [facility] Provider Note   Event Date/Time   First MD Initiated Contact with Patient 05/29/23 1652     (approximate) History  Abdominal Pain, Anxiety, and Toe Pain  HPI Sherry Bryan is a 28 y.o. female with a past medical history of PTSD, depression/anxiety, intellectual disability, borderline personality disorder, and DMDD who presents from a care home with complaints of increasing abdominal pain, diarrhea, increased anxiety, and left great toe pain after removing her toenail due to anxiety.  Patient describes a pressing feeling on the inside of her abdomen over the last 48 hours with associated diarrhea.  Patient also states that she felt as though she was getting an ingrown toenail in her left great toe and decided to remove the toenail altogether.  Patient has been complaining of worsening pain over this left great toe since she remove the nail.  Patient states that eating worsens her abdominal pain denies any relieving factors. ROS: Patient currently denies any vision changes, tinnitus, difficulty speaking, facial droop, sore throat, chest pain, shortness of breath, abdominal pain, nausea/vomiting/diarrhea, dysuria, or weakness/numbness/paresthesias in any extremity   Physical Exam  Triage Vital Signs: ED Triage Vitals  Encounter Vitals Group     BP 05/29/23 1641 103/81     Systolic BP Percentile --      Diastolic BP Percentile --      Pulse Rate 05/29/23 1641 92     Resp 05/29/23 1641 17     Temp 05/29/23 1641 97.8 F (36.6 C)     Temp Source 05/29/23 1641 Oral     SpO2 05/29/23 1641 94 %     Weight 05/29/23 1641 300 lb (136.1 kg)     Height 05/29/23 1641 5\' 4"  (1.626 m)     Head Circumference --      Peak Flow --      Pain Score 05/29/23 1646 9     Pain Loc --      Pain Education --      Exclude from Growth Chart --    Most recent vital signs: Vitals:   05/29/23 1641  BP: 103/81  Pulse: 92  Resp: 17  Temp: 97.8 F (36.6 C)  SpO2: 94%    General: Awake, oriented x4. CV:  Good peripheral perfusion.  Resp:  Normal effort.  Abd:  No distention. Tenderness to palpation of the midepigastric region Other:  Middle-aged morbidly obese Caucasian female resting comfortably in no acute distress.  Absent left great toenail with raw toenail bed without surrounding erythema ED Results / Procedures / Treatments  Labs (all labs ordered are listed, but only abnormal results are displayed) Labs Reviewed  COMPREHENSIVE METABOLIC PANEL WITH GFR - Abnormal; Notable for the following components:      Result Value   Glucose, Bld 143 (*)    Calcium 8.7 (*)    Albumin 3.2 (*)    AST 11 (*)    All other components within normal limits  CBC - Abnormal; Notable for the following components:   RBC 5.29 (*)    Hemoglobin 11.8 (*)    MCV 76.6 (*)    MCH 22.3 (*)    MCHC 29.1 (*)    RDW 19.0 (*)    All other components within normal limits  URINALYSIS, ROUTINE W REFLEX MICROSCOPIC - Abnormal; Notable for the following components:   Color, Urine YELLOW (*)    APPearance CLEAR (*)    All other components within normal limits  LIPASE, BLOOD  POC  URINE PREG, ED   PROCEDURES: Critical Care performed: No Procedures MEDICATIONS ORDERED IN ED: Medications  bacitracin  ointment ( Topical Given 05/29/23 1721)   IMPRESSION / MDM / ASSESSMENT AND PLAN / ED COURSE  I reviewed the triage vital signs and the nursing notes.                             The patient is on the cardiac monitor to evaluate for evidence of arrhythmia and/or significant heart rate changes. Patient's presentation is most consistent with acute presentation with potential threat to life or bodily function. Patient's symptoms not typical for emergent causes of abdominal pain such as, but not limited to, appendicitis, abdominal aortic aneurysm, surgical biliary disease, pancreatitis, SBO, mesenteric ischemia, serious intra-abdominal bacterial illness. Presentation also not  typical of gynecologic emergencies such as TOA, Ovarian Torsion, PID. Not Ectopic. Doubt atypical ACS. Patient was given wound care instructions and strict return precautions regarding the absent toenail of her great toe Pt tolerating PO. Disposition: Patient will be discharged with strict return precautions and follow up with primary MD within 12-24 hours for further evaluation. Patient understands that this still may have an early presentation of an emergent medical condition such as appendicitis that will require a recheck.   FINAL CLINICAL IMPRESSION(S) / ED DIAGNOSES   Final diagnoses:  Epigastric pain  Diarrhea, unspecified type  Avulsion of toenail of left foot   Rx / DC Orders   ED Discharge Orders          Ordered    loperamide  (IMODIUM  A-D) 2 MG tablet  4 times daily PRN        05/29/23 1822    bacitracin  500 UNIT/GM ointment  2 times daily        05/29/23 1822           Note:  This document was prepared using Dragon voice recognition software and may include unintentional dictation errors.   Charleen Conn, MD 05/30/23 [phone]

## 2023-05-29 NOTE — ED Notes (Signed)
 Caregiver from group home with pt in hallway bed.  Pt calm and cooperative. Md at bedside.

## 2023-05-29 NOTE — ED Triage Notes (Signed)
 Patient presents with abdominal pain and anxiety. Patient is having left foot, great toe pain due to picking toe nail off. Reports she picked toe nail off due to her anxiety.   States she has drooling spells

## 2023-05-29 NOTE — ED Notes (Signed)
 Pt caregiver to the front desk stating that pt was leaving

## 2023-05-29 NOTE — ED Notes (Signed)
 Left great toe tdressed with gauze and ointment.

## 2023-05-29 NOTE — ED Notes (Signed)
 Poct pregnancy Negative

## 2023-05-30 ENCOUNTER — Other Ambulatory Visit: Payer: Self-pay

## 2023-05-30 ENCOUNTER — Emergency Department
Admission: EM | Admit: 2023-05-30 | Discharge: 2023-05-30 | Disposition: A | Discharge status: Home / Self Care | Payer: MEDICAID | Attending: Emergency Medicine | Admitting: Emergency Medicine

## 2023-05-30 DIAGNOSIS — R45851 Suicidal ideations: Secondary | ICD-10-CM | POA: Insufficient documentation

## 2023-05-30 DIAGNOSIS — S99922A Unspecified injury of left foot, initial encounter: Secondary | ICD-10-CM | POA: Diagnosis present

## 2023-05-30 DIAGNOSIS — X58XXXA Exposure to other specified factors, initial encounter: Secondary | ICD-10-CM | POA: Insufficient documentation

## 2023-05-30 LAB — COMPREHENSIVE METABOLIC PANEL WITH GFR
ALT: 12 U/L (ref 0–44)
AST: 11 U/L — ABNORMAL LOW (ref 15–41)
Albumin: 3.2 g/dL — ABNORMAL LOW (ref 3.5–5.0)
Alkaline Phosphatase: 52 U/L (ref 38–126)
Anion gap: 11 (ref 5–15)
BUN: 15 mg/dL (ref 6–20)
CO2: 24 mmol/L (ref 22–32)
Calcium: 9.1 mg/dL (ref 8.9–10.3)
Chloride: 102 mmol/L (ref 98–111)
Creatinine, Ser: 1.33 mg/dL — ABNORMAL HIGH (ref 0.44–1.00)
GFR, Estimated: 57 mL/min — ABNORMAL LOW (ref >60)
Glucose, Bld: 145 mg/dL — ABNORMAL HIGH (ref 70–99)
Potassium: 3.9 mmol/L (ref 3.5–5.1)
Sodium: 137 mmol/L (ref 135–145)
Total Bilirubin: 0.4 mg/dL (ref 0.0–1.2)
Total Protein: 7 g/dL (ref 6.5–8.1)

## 2023-05-30 LAB — CBC
HCT: 38.9 % (ref 36.0–46.0)
Hemoglobin: 11.6 g/dL — ABNORMAL LOW (ref 12.0–15.0)
MCH: 23 pg — ABNORMAL LOW (ref 26.0–34.0)
MCHC: 29.8 g/dL — ABNORMAL LOW (ref 30.0–36.0)
MCV: 77 fL — ABNORMAL LOW (ref 80.0–100.0)
Platelets: 246 10*3/uL (ref 150–400)
RBC: 5.05 MIL/uL (ref 3.87–5.11)
RDW: 18.9 % — ABNORMAL HIGH (ref 11.5–15.5)
WBC: 8.6 10*3/uL (ref 4.0–10.5)
nRBC: 0 % (ref 0.0–0.2)

## 2023-05-30 LAB — ETHANOL: Alcohol, Ethyl (B): <15 mg/dL (ref <15)

## 2023-05-30 MED ORDER — BACITRACIN ZINC 500 UNIT/GM EX OINT
TOPICAL_OINTMENT | Freq: Once | CUTANEOUS | Status: AC
  Filled 2023-05-30: qty 1.8

## 2023-05-30 MED ORDER — BACITRACIN ZINC 500 UNIT/GM EX OINT: TOPICAL_OINTMENT | Freq: Two times a day (BID) | CUTANEOUS | 0 refills | Status: AC

## 2023-05-30 NOTE — Group Note (Deleted)
 Date:  05/30/2023 Time:  9:45 PM  Group Topic/Focus:  Wrap-Up Group:   The focus of this group is to help patients review their daily goal of treatment and discuss progress on daily workbooks.     Participation Level:  {BHH PARTICIPATION EAVWU:98119}  Participation Quality:  {BHH PARTICIPATION QUALITY:22265}  Affect:  {BHH AFFECT:22266}  Cognitive:  {BHH COGNITIVE:22267}  Insight: {BHH Insight2:20797}  Engagement in Group:  {BHH ENGAGEMENT IN JYNWG:95621}  Modes of Intervention:  {BHH MODES OF INTERVENTION:22269}  Additional Comments:  ***  Maglione,Preciosa Bundrick E 05/30/2023, 9:45 PM

## 2023-05-30 NOTE — ED Triage Notes (Signed)
 Pt presents to the ED with nausea and SI without a plan.

## 2023-05-30 NOTE — ED Provider Notes (Signed)
 [facility] Provider Note    Event Date/Time   First MD Initiated Contact with Patient 05/30/23 1953     (approximate)   History   Suicidal and Nausea   HPI Sherry Bryan is a 28 y.o. female presenting today for 2 complaints.  She states she originally had a brief episode of suicidal ideation earlier today.  States it was brought on by stress.  She currently denies any SI at this time.  She also states having nausea which is now resolved and feeling fine as well as having possible infection to her toe.  She was seen for toe infection yesterday and started on bacitracin .  She states she has not picked up the medication to take.  Denies any other new injuries.  No current SI or HI.     Physical Exam   Triage Vital Signs: ED Triage Vitals [05/30/23 1846]  Encounter Vitals Group     BP 103/68     Systolic BP Percentile      Diastolic BP Percentile      Pulse Rate 94     Resp 18     Temp 98 F (36.7 C)     Temp Source Oral     SpO2 95 %     Weight 300 lb (136.1 kg)     Height 5\' 4"  (1.626 m)     Head Circumference      Peak Flow      Pain Score 0     Pain Loc      Pain Education      Exclude from Growth Chart     Most recent vital signs: Vitals:   05/30/23 1846  BP: 103/68  Pulse: 94  Resp: 18  Temp: 98 F (36.7 C)  SpO2: 95%   I have reviewed the vital signs. General:  Awake, alert, no acute distress. Head:  Normocephalic, Atraumatic. EENT:  PERRL, EOMI, Oral mucosa pink and moist, Neck is supple. Cardiovascular: Regular rate, 2+ distal pulses. Respiratory:  Normal respiratory effort, symmetrical expansion, no distress.   Extremities:  Moving all four extremities through full ROM without pain.   Neuro:  Alert and oriented.  Interacting appropriately.   Skin:  Warm, dry, no rash.  Slight erythema noted around the left great toe with toenail removed Psych: Appropriate affect.    ED Results / Procedures / Treatments   Labs (all  labs ordered are listed, but only abnormal results are displayed) Labs Reviewed  COMPREHENSIVE METABOLIC PANEL WITH GFR - Abnormal; Notable for the following components:      Result Value   Glucose, Bld 145 (*)    Creatinine, Ser 1.33 (*)    Albumin 3.2 (*)    AST 11 (*)    GFR, Estimated 57 (*)    All other components within normal limits  CBC - Abnormal; Notable for the following components:   Hemoglobin 11.6 (*)    MCV 77.0 (*)    MCH 23.0 (*)    MCHC 29.8 (*)    RDW 18.9 (*)    All other components within normal limits  ETHANOL  URINE DRUG SCREEN, QUALITATIVE ([facility] ONLY)  POC URINE PREG, ED     EKG    RADIOLOGY    PROCEDURES:  Critical Care performed: No  Procedures   MEDICATIONS ORDERED IN ED: Medications  bacitracin  ointment (has no administration in time range)     IMPRESSION / MDM / ASSESSMENT AND PLAN / ED COURSE  I reviewed the triage vital signs and the nursing notes.                              Differential diagnosis includes, but is not limited to, passive SI, toe infection  Patient's presentation is most consistent with acute complicated illness / injury requiring diagnostic workup.  Patient is a 28 year old female presenting today for SI and injury to her toe.  She denies any suicidal ideations at this time and caregiver is here with her.  She has been seen twice in the ED in the past 2 weeks for similar symptoms and do not think at this time she warrants another psychiatric evaluation.  Separately for her toe injury will give her bacitracin  and send them home with printed prescription for which she was treated for yesterday.  Otherwise stable for discharge and caregiver agreeable with this plan to go home.     FINAL CLINICAL IMPRESSION(S) / ED DIAGNOSES   Final diagnoses:  Injury of toe on left foot, initial encounter  Suicidal ideation     Rx / DC Orders   ED Discharge Orders          Ordered    bacitracin  ointment  2 times daily         05/30/23 2114             Note:  This document was prepared using Dragon voice recognition software and may include unintentional dictation errors.   Kandee Orion, MD 05/30/23 2116

## 2023-05-30 NOTE — ED Notes (Signed)
 Patient ambulated to 20 H, she has a sitter with her, she is calm and cooperative.

## 2023-05-30 NOTE — Discharge Instructions (Signed)
 Please apply the bacitracin  twice daily to the toe and wrapped with gauze.  Follow-up with your primary care provider.

## 2023-05-30 NOTE — ED Notes (Signed)
 Pt dressed into psych safe clothes. Pt has coral color t-shirt with grey/blue pants with blue sandals.

## 2023-05-30 NOTE — ED Notes (Addendum)
 Nurse wrapped Patients right big toe after applying the bacitracin . Patient smiling and laughing.

## 2023-05-30 NOTE — ED Notes (Signed)
 Patient denies Si/hi or avh, states she feels better .

## 2023-05-31 ENCOUNTER — Encounter: Payer: Self-pay | Admitting: Emergency Medicine

## 2023-05-31 ENCOUNTER — Emergency Department
Admission: EM | Admit: 2023-05-31 | Discharge: 2023-05-31 | Disposition: A | Discharge status: Home / Self Care | Payer: MEDICAID | Attending: Emergency Medicine | Admitting: Emergency Medicine

## 2023-05-31 DIAGNOSIS — R42 Dizziness and giddiness: Secondary | ICD-10-CM | POA: Insufficient documentation

## 2023-05-31 DIAGNOSIS — R4781 Slurred speech: Secondary | ICD-10-CM | POA: Insufficient documentation

## 2023-05-31 LAB — COMPREHENSIVE METABOLIC PANEL WITH GFR
ALT: 11 U/L (ref 0–44)
AST: 12 U/L — ABNORMAL LOW (ref 15–41)
Albumin: 3 g/dL — ABNORMAL LOW (ref 3.5–5.0)
Alkaline Phosphatase: 50 U/L (ref 38–126)
Anion gap: 8 (ref 5–15)
BUN: 19 mg/dL (ref 6–20)
CO2: 25 mmol/L (ref 22–32)
Calcium: 9 mg/dL (ref 8.9–10.3)
Chloride: 102 mmol/L (ref 98–111)
Creatinine, Ser: 1 mg/dL (ref 0.44–1.00)
GFR, Estimated: >60 mL/min (ref >60)
Glucose, Bld: 119 mg/dL — ABNORMAL HIGH (ref 70–99)
Potassium: 4.1 mmol/L (ref 3.5–5.1)
Sodium: 135 mmol/L (ref 135–145)
Total Bilirubin: 0.5 mg/dL (ref 0.0–1.2)
Total Protein: 6.6 g/dL (ref 6.5–8.1)

## 2023-05-31 LAB — URINALYSIS, ROUTINE W REFLEX MICROSCOPIC
Bilirubin Urine: NEGATIVE
Glucose, UA: NEGATIVE mg/dL
Hgb urine dipstick: NEGATIVE
Ketones, ur: NEGATIVE mg/dL
Leukocytes,Ua: NEGATIVE
Nitrite: NEGATIVE
Protein, ur: NEGATIVE mg/dL
Specific Gravity, Urine: 1.012 (ref 1.005–1.030)
pH: 5 (ref 5.0–8.0)

## 2023-05-31 LAB — CBC
HCT: 37.2 % (ref 36.0–46.0)
Hemoglobin: 11.1 g/dL — ABNORMAL LOW (ref 12.0–15.0)
MCH: 22.5 pg — ABNORMAL LOW (ref 26.0–34.0)
MCHC: 29.8 g/dL — ABNORMAL LOW (ref 30.0–36.0)
MCV: 75.3 fL — ABNORMAL LOW (ref 80.0–100.0)
Platelets: 241 10*3/uL (ref 150–400)
RBC: 4.94 MIL/uL (ref 3.87–5.11)
RDW: 18.6 % — ABNORMAL HIGH (ref 11.5–15.5)
WBC: 8 10*3/uL (ref 4.0–10.5)
nRBC: 0 % (ref 0.0–0.2)

## 2023-05-31 LAB — POC URINE PREG, ED: Preg Test, Ur: NEGATIVE

## 2023-05-31 NOTE — ED Provider Notes (Signed)
 [facility] Provider Note    Event Date/Time   First MD Initiated Contact with Patient 05/31/23 1513     (approximate)   History   Dizziness   HPI  Sherry Bryan is a 28 y.o. female   who presents to the emergency department today because of concerns for dizziness and self-reported slurred speech and "drooling" (she describes that sometimes when she opens her mouth she feels like she has a lot of saliva in it).  She states that her symptoms have been on and off for at least 1 week.  She says that it occurs when she is not fully awake.  Of note the patient has been seen in the emergency department twice in the past week however states she did not think to discuss this with the physicians she saw at that time.  Patient denies any fevers.  Denies any headaches.  Denies any suicidal ideation.      Physical Exam   Triage Vital Signs: ED Triage Vitals  Encounter Vitals Group     BP 05/31/23 1417 116/71     Systolic BP Percentile --      Diastolic BP Percentile --      Pulse Rate 05/31/23 1417 98     Resp 05/31/23 1417 17     Temp 05/31/23 1417 97.6 F (36.4 C)     Temp Source 05/31/23 1417 Oral     SpO2 05/31/23 1417 93 %     Weight 05/31/23 1417 300 lb (136.1 kg)     Height 05/31/23 1417 5\' 4"  (1.626 m)     Head Circumference --      Peak Flow --      Pain Score 05/31/23 1416 6     Pain Loc --      Pain Education --      Exclude from Growth Chart --     Most recent vital signs: Vitals:   05/31/23 1417  BP: 116/71  Pulse: 98  Resp: 17  Temp: 97.6 F (36.4 C)  SpO2: 93%   General: Awake, alert, oriented. CV:  Good peripheral perfusion. Regular rate and rhythm. Resp:  Normal effort. Lungs clear. Abd:  No distention.  Other:  PERRL. EOMI. Face symmetric. Tongue midline. Patinet managing secretions. Strength 5/5 in upper and lower extremities. Sensation grossly intact.    ED Results / Procedures / Treatments   Labs (all labs ordered  are listed, but only abnormal results are displayed) Labs Reviewed  CBC - Abnormal; Notable for the following components:      Result Value   Hemoglobin 11.1 (*)    MCV 75.3 (*)    MCH 22.5 (*)    MCHC 29.8 (*)    RDW 18.6 (*)    All other components within normal limits  COMPREHENSIVE METABOLIC PANEL WITH GFR  URINALYSIS, ROUTINE W REFLEX MICROSCOPIC  CBG MONITORING, ED  POC URINE PREG, ED     EKG  I, [name], attending physician, personally viewed and interpreted this EKG  EKG Time: 1419 Rate: 96 Rhythm: normal sinus rhythm Axis: normal Intervals: qtc 454 QRS: narrow, low voltage ST changes: no st elevation Impression: abnormal ekg   RADIOLOGY None  PROCEDURES:  Critical Care performed: No    MEDICATIONS ORDERED IN ED: Medications - No data to display   IMPRESSION / MDM / ASSESSMENT AND PLAN / ED COURSE  I reviewed the triage vital signs and the nursing notes.  Differential diagnosis includes, but is not limited to, weakness, anemia, electrolyte abnormality  Patient's presentation is most consistent with acute presentation with potential threat to life or bodily function.   Patient presented to the emergency department today because of concerns for dizziness, slurred speech and drooling.  On exam patient without any focal neurodeficits.  She does state that it comes and goes.  Says been going on for at least 1 week although it appears she has not discussed this during her previous ER visits nor has any other physician noticed any concerning neurologic issues during their exams.  Blood work was checked today and is essentially unchanged from blood work checked yesterday.  Urine was checked 2 days ago without signs of infection.  At this time somewhat unclear etiology although I do think is reasonable for patient be discharged and follow-up with outpatient providers.      FINAL CLINICAL IMPRESSION(S) / ED DIAGNOSES    Final diagnoses:  Dizziness        Rx / DC Orders     Note:  This document was prepared using Dragon voice recognition software and may include unintentional dictation errors.    [name], MD 05/31/23 1539

## 2023-05-31 NOTE — ED Notes (Signed)
 Pt reports she has been SI for  awhile.  Pt has been here several times for similar sx.  Pt tearful at times.  Caregiver with pt.  Pt in hallway bed.  Pt was just discharged appro 2 hours ago with similar sx.

## 2023-05-31 NOTE — ED Triage Notes (Signed)
 Patient to ED via POV for dizziness and drooling. States ongoing x2 weeks. Pt asked if she was evaluated for this on her multiple other visits within this time frame and by stated "I don't know." Denies SI.  Legal guardian called at this time. No answer- message left regarding pt arrival to ED.

## 2023-05-31 NOTE — ED Provider Notes (Signed)
 [facility] Provider Note    Event Date/Time   First MD Initiated Contact with Patient 05/31/23 1704     (approximate)   History   Dizziness and Suicidal   HPI  Sherry Bryan is a 28 y.o. female patient recheck back to the emergency department for the same symptoms I had just seen her for about an hour and a half ago as well as now complaining of suicidal ideation.  Patient had denied suicidal ideation to me at our previous encounter this afternoon.  Caregiver does state that she would like to be here and patient stated state when pressed that she just wants to stay in the emergency department.     Physical Exam   Triage Vital Signs: ED Triage Vitals  Encounter Vitals Group     BP 05/31/23 1657 113/88     Systolic BP Percentile --      Diastolic BP Percentile --      Pulse Rate 05/31/23 1657 91     Resp 05/31/23 1657 17     Temp 05/31/23 1657 98 F (36.7 C)     Temp Source 05/31/23 1657 Oral     SpO2 05/31/23 1657 95 %     Weight 05/31/23 1658 299 lb 13.2 oz (136 kg)     Height 05/31/23 1658 5\' 4"  (1.626 m)     Head Circumference --      Peak Flow --      Pain Score 05/31/23 1657 7     Pain Loc --      Pain Education --      Exclude from Growth Chart --     Most recent vital signs: Vitals:   05/31/23 1657  BP: 113/88  Pulse: 91  Resp: 17  Temp: 98 F (36.7 C)  SpO2: 95%   General: Awake, alert, oriented. CV:  Good peripheral perfusion.  Resp:  Normal effort.  Abd:  No distention.   ED Results / Procedures / Treatments   Labs (all labs ordered are listed, but only abnormal results are displayed) Labs Reviewed - No data to display   EKG  None   RADIOLOGY None   PROCEDURES:  Critical Care performed: No    MEDICATIONS ORDERED IN ED: Medications - No data to display   IMPRESSION / MDM / ASSESSMENT AND PLAN / ED COURSE  I reviewed the triage vital signs and the nursing notes.                               Differential diagnosis includes, but is not limited to, secondary gain, psychiatric illness  Patient's presentation is most consistent with acute presentation with potential threat to life or bodily function.   Patient rechecked into the emergency department with same complaints as I saw her for chest a couple hours earlier as well as new complaint of SI which patient had denied prior to being discharged earlier.  At this time I do think patient likely having secondary gain of wanting to just be in the emergency department..  I discussed this with the caregiver who did agree.  Caregiver did ask if we could just have her sit here although I did explain that some of the emergency department is for.  I encourage patient to follow-up with outpatient providers.      FINAL CLINICAL IMPRESSION(S) / ED DIAGNOSES   Final diagnoses:  Dizziness  Rx / DC Orders       Note:  This document was prepared using Dragon voice recognition software and may include unintentional dictation errors.    [name], MD 05/31/23 [phone]

## 2023-05-31 NOTE — ED Triage Notes (Signed)
 Patient to ED via POV afer being discharged. C/o of same symptoms as when triaged at 1414 today- dizziness and drooling. Pt reports symptoms have not got better. NAD noted.  Pt now having suicidal thoughts after being brought up by caregiver.  States she would use an ice pick to cut her throat. When asked if she would actually do this pt reports "no".

## 2023-06-01 ENCOUNTER — Emergency Department
Admission: EM | Admit: 2023-06-01 | Discharge: 2023-06-02 | Disposition: A | Discharge status: Home / Self Care | Payer: MEDICAID | Attending: Emergency Medicine | Admitting: Emergency Medicine

## 2023-06-01 ENCOUNTER — Other Ambulatory Visit: Payer: Self-pay

## 2023-06-01 ENCOUNTER — Emergency Department
Admission: EM | Admit: 2023-06-01 | Discharge: 2023-06-01 | Disposition: A | Discharge status: Home / Self Care | Payer: MEDICAID | Attending: Emergency Medicine | Admitting: Emergency Medicine

## 2023-06-01 ENCOUNTER — Encounter: Payer: Self-pay | Admitting: Emergency Medicine

## 2023-06-01 DIAGNOSIS — Z765 Malingerer [conscious simulation]: Secondary | ICD-10-CM

## 2023-06-01 DIAGNOSIS — F603 Borderline personality disorder: Secondary | ICD-10-CM | POA: Insufficient documentation

## 2023-06-01 DIAGNOSIS — F329 Major depressive disorder, single episode, unspecified: Secondary | ICD-10-CM | POA: Insufficient documentation

## 2023-06-01 DIAGNOSIS — R45851 Suicidal ideations: Secondary | ICD-10-CM

## 2023-06-01 DIAGNOSIS — Z753 Unavailability and inaccessibility of health-care facilities: Secondary | ICD-10-CM | POA: Insufficient documentation

## 2023-06-01 DIAGNOSIS — Z758 Other problems related to medical facilities and other health care: Secondary | ICD-10-CM

## 2023-06-01 DIAGNOSIS — F70 Mild intellectual disabilities: Secondary | ICD-10-CM | POA: Insufficient documentation

## 2023-06-01 DIAGNOSIS — F79 Unspecified intellectual disabilities: Secondary | ICD-10-CM | POA: Diagnosis not present

## 2023-06-01 NOTE — ED Provider Notes (Signed)
 [facility] Provider Note    Event Date/Time   First MD Initiated Contact with Patient 06/01/23 1740     (approximate)   History   Suicidal   HPI  Sherry Bryan is a 28 y.o. female  who rechecked back into the emergency department for the same complaints as previous H and Ps. Please see documentation from earlier today and yesterday.       Physical Exam   Triage Vital Signs: ED Triage Vitals  Encounter Vitals Group     BP 06/01/23 1753 120/72     Systolic BP Percentile --      Diastolic BP Percentile --      Pulse Rate 06/01/23 1753 88     Resp 06/01/23 1753 20     Temp 06/01/23 1753 97.9 F (36.6 C)     Temp Source 06/01/23 1753 Oral     SpO2 06/01/23 1753 95 %     Weight --      Height --      Head Circumference --      Peak Flow --      Pain Score 06/01/23 2115 0     Pain Loc --      Pain Education --      Exclude from Growth Chart --     Most recent vital signs: Vitals:   06/01/23 1753 06/01/23 2230  BP: 120/72 (!) 100/57  Pulse: 88 92  Resp: 20 18  Temp: 97.9 F (36.6 C) 97.8 F (36.6 C)  SpO2: 95% 95%   General: Awake, alert, no distress. CV:  Good peripheral perfusion.  Resp:  Normal effort.  Abd:  No distention.     ED Results / Procedures / Treatments   Labs (all labs ordered are listed, but only abnormal results are displayed) Labs Reviewed - No data to display   EKG  None   RADIOLOGY None   PROCEDURES:  Critical Care performed: No  MEDICATIONS ORDERED IN ED: Medications - No data to display   IMPRESSION / MDM / ASSESSMENT AND PLAN / ED COURSE  I reviewed the triage vital signs and the nursing notes.                              Differential diagnosis includes, but is not limited to, secondary gain, malingering, psychiatric illness  Patient's presentation is most consistent with acute presentation with potential threat to life or bodily function.   Patient presented to the emergency  department again with the exact same complaints she has had the numerous other times have seen her in the past 24 hours and for which she has been seen multiple times by other providers.  I did have a longer discussion with the patient and caregiver both about seeking outpatient care for these nonemergent issues.  Plan was to discharge patient.  Apparently as they were waiting in the waiting room the patient caregiver stated that she was running out into traffic although nursing staff never witnessed this behavior.  Because of this her caregiver called police who IVC the patient.  At this time will have psychiatry evaluate.   The patient has been placed in psychiatric observation due to the need to provide a safe environment for the patient while obtaining psychiatric consultation and evaluation, as well as ongoing medical and medication management to treat the patient's condition.  The patient has been placed under full IVC at this time.  FINAL CLINICAL IMPRESSION(S) / ED DIAGNOSES   Final diagnoses:  Difficulty navigating healthcare system        Rx / DC Orders     Note:  This document was prepared using Dragon voice recognition software and may include unintentional dictation errors.    [name], MD 06/01/23 [phone]

## 2023-06-01 NOTE — ED Provider Notes (Signed)
   [facility] Provider Note    Event Date/Time   First MD Initiated Contact with Patient 06/01/23 1622     (approximate)   History   Suicidal   HPI  Sherry Bryan is a 28 y.o. female who presents to the emergency department today because she states that she feels suicidal.  I did evaluate the patient multiple times yesterday for this and she has been seen recently by psychiatry.  She is accompanied by the same caregiver today.  This has been going on for weeks.  It does not sound like anything new has happened today.  Patient had been given [facility] follow-up information but did not follow-up with [facility].     Physical Exam   Triage Vital Signs: ED Triage Vitals [06/01/23 1624]  Encounter Vitals Group     BP 118/79     Systolic BP Percentile      Diastolic BP Percentile      Pulse Rate 92     Resp 20     Temp 98 F (36.7 C)     Temp src      SpO2 96 %     Weight      Height      Head Circumference      Peak Flow      Pain Score      Pain Loc      Pain Education      Exclude from Growth Chart     Most recent vital signs: Vitals:   06/01/23 1624  BP: 118/79  Pulse: 92  Resp: 20  Temp: 98 F (36.7 C)  SpO2: 96%   General: Awake, alert, oriented. CV:  Good peripheral perfusion.  Resp:  Normal effort.  Abd:  No distention.    ED Results / Procedures / Treatments   Labs (all labs ordered are listed, but only abnormal results are displayed) Labs Reviewed - No data to display   EKG  None   RADIOLOGY None   PROCEDURES:  Critical Care performed: No    MEDICATIONS ORDERED IN ED: Medications - No data to display   IMPRESSION / MDM / ASSESSMENT AND PLAN / ED COURSE  I reviewed the triage vital signs and the nursing notes.                              Differential diagnosis includes, but is not limited to, malingering, secondary gain, psychiatric illness  Patient's presentation is most consistent with acute presentation  with potential threat to life or bodily function.   Patient presented to the emergency department today claiming that she feels suicidal.  She has felt this way for weeks.  It does not appear anything acute is happening today.  She had been seen in the emergency department yesterday.  Accompanied by the same caregiver who is with her yesterday.  Unclear why she has not followed up with [facility].  At this time I do not think she is an acute danger to herself.  Did encourage patient to follow-up with [facility].      FINAL CLINICAL IMPRESSION(S) / ED DIAGNOSES   Final diagnoses:  Difficulty navigating healthcare system        Rx / DC Orders       Note:  This document was prepared using Dragon voice recognition software and may include unintentional dictation errors.    [name], MD 06/01/23 [phone]

## 2023-06-01 NOTE — ED Triage Notes (Signed)
 Pt via POV with caregiver from group home. Pt was seen multiple times yesterday. Pt c/o suicidal ideation. Dr. Hendrick Locke saw pt yesterday with referral to [facility], caregiver states they did not go to [facility]. Pt is A&OX4 and NAD

## 2023-06-01 NOTE — ED Notes (Signed)
 Black slides Tan floral jacket Black leggings Black shirt

## 2023-06-01 NOTE — ED Triage Notes (Signed)
 Pt to ED after checking in and being discharged multiple times today and yesterday for SI. EDP has expressed need to follow up with [facility]. Caregiver told this multiple times.

## 2023-06-01 NOTE — ED Notes (Signed)
 Pt returns to ED lobby accomp by caregiver and [facility] PD officer; reports pt has been sitting outside since last triage and refuses to leave; officer now with IVC papers; Dr Hendrick Locke and charge nurse Bettey Browning RN notified; no further orders given, will transport pt to [facility] for further evaluation

## 2023-06-02 DIAGNOSIS — F603 Borderline personality disorder: Secondary | ICD-10-CM

## 2023-06-02 DIAGNOSIS — F79 Unspecified intellectual disabilities: Secondary | ICD-10-CM

## 2023-06-02 NOTE — ED Provider Notes (Signed)
 Emergency department handoff note  Care of this patient was signed out to me at the end of the previous provider shift.  All pertinent patient information was conveyed and all questions were answered.  Patient pending psychiatric evaluation that has found patient has chronic suicidal ideation which is unchanged from baseline and therefore does not recommend inpatient psychiatric admission at this time.  Patient is recommended to follow-up at [facility] as previously instructed.  Dispo: Discharge home with [facility] follow-up   Jaine Estabrooks K, MD 06/02/23 1320

## 2023-06-02 NOTE — ED Notes (Signed)
 Caregiver from pt group home notified of discharge status and advised staff was coming to get her in the next hour.

## 2023-06-02 NOTE — ED Notes (Addendum)
 After multiple more attempts to get someone on the phone for this group home. [employer] contacted to have them go to the address listed to have staff member call ER. Numbers called : [phone]  [employer] will send officer by there.

## 2023-06-02 NOTE — ED Notes (Signed)
 Ivc/ psych consult pending

## 2023-06-02 NOTE — ED Notes (Signed)
 This RN attempted to call legal guardian Lorrin Barba Bonnet x2 with no answer.

## 2023-06-02 NOTE — ED Notes (Signed)
VOL/Pending Discharge 

## 2023-06-02 NOTE — ED Notes (Signed)
 This RN attempted to call Ave [employer]o (legal guardian) and was unsuccessful. Left voicemail with call back info.

## 2023-06-02 NOTE — ED Provider Notes (Signed)
-----------------------------------------   3:35 AM on 06/02/2023 -----------------------------------------   Patient remains in the ED under IVC pending psychiatry consultation.   Riyanna Crutchley J, MD 06/02/23 [phone]

## 2023-06-02 NOTE — ED Notes (Signed)
 IVC rescinded per Dr. Alejo Amsler. Pt now VOL

## 2023-06-02 NOTE — ED Notes (Signed)
 PT  VOL
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
## 2023-06-02 NOTE — ED Notes (Addendum)
 This RN called group home and left message. [phone] automated service. Not very user friendly messaging system. Charge RN also made aware.

## 2023-06-02 NOTE — BH Assessment (Signed)
 Comprehensive Clinical Assessment (CCA) Screening, Triage and Referral Note  06/02/2023 Sherry Bryan [id number] Recommendations for Services/Supports/Treatments: Disposition pending Sherry Bryan is a 28 y.o., Caucasian, Not Hispanic or Latino ethnicity, ENGLISH speaking female that presented to the ED under IVC.  Per triage note: Pt via POV with caregiver from group home. Pt was seen multiple times yesterday. Pt c/o suicidal ideation. Dr. Hendrick Locke saw pt yesterday with referral to [facility], caregiver states they did not go to [facility].  Since arrival to the ED, the patient has been cooperative. Pt presented with a somber mood and a congruent affect. Pt was somnolent, but was able to participate in the assessment. Pt's speech was slurred. Pt was able to identify why she'd presented to the ED and admitted that she'd began having thoughts of SI and that she'd pushed the group home staff prior to her arrival. Pt reported that she had not followed up at [facility] because she could not make it to the appointment. Pt explained that things have been hard for her this month due to it being the anniversary of her mother's death. Pt had lacking insight and judgment. Pt presented with relevant thought processes and slowed psychomotor activity. The pt continued to endorse SI; however, pt never stated a specific plan. The patient denied current HI or AV/H.     Chief Complaint:  Chief Complaint  Patient presents with   Suicidal   Visit Diagnosis: 1.  MDD, recurrent, mod 2.  Borderline Personality Disorder 3.  SI/HI  Patient Reported Information How did you hear about us ? Self  What Is the Reason for Your Visit/Call Today? Patient to ED via [facility] V[facility]  for psych evaluation. Pt reports being SI/HI. Dc'd this AM for same. Pt reports that she also needs help with alcohol after having a "few sips for the first time the other day." Denies drinking today.  How Long Has This Been Causing You Problems? <Week  What Do You Feel  Would Help You the Most Today? Treatment for Depression or other mood problem   Have You Recently Had Any Thoughts About Hurting Yourself? Yes  Are You Planning to Commit Suicide/Harm Yourself At This time? No   Have you Recently Had Thoughts About Hurting Someone Sherry Bryan? No  Are You Planning to Harm Someone at This Time? No  Explanation: Pt endorsed having SI without a concrete plan.   Have You Used Any Alcohol or Drugs in the Past 24 Hours? No  How Long Ago Did You Use Drugs or Alcohol? No data recorded What Did You Use and How Much? No data recorded  Do You Currently Have a Therapist/Psychiatrist? Yes  Name of Therapist/Psychiatrist: Pt gets her medications prescribed through her group home.   Have You Been Recently Discharged From Any Office Practice or Programs? No  Explanation of Discharge From Practice/Program: No data recorded   CCA Screening Triage Referral Assessment Type of Contact: Face-to-Face  Telemedicine Service Delivery:   Is this Initial or Reassessment?   Date Telepsych consult ordered in [facility]:    Time Telepsych consult ordered in [facility]:    Location of Assessment: [facility] ED  Provider Location: [facility] ED    Collateral Involvement: None provided   Does Patient Have a Court Appointed Legal Guardian? No data recorded Name and Contact of Legal Guardian: No data recorded If Minor and Not Living with Parent(s), Who has Custody? n/a  Is CPS involved or ever been involved? Never  Is APS involved or ever been involved? Never  Patient Determined To Be At Risk for Harm To Self or Others Based on Review of Patient Reported Information or Presenting Complaint? No  Method: No Plan  Availability of Means: No access or NA  Intent: Clearly intends on inflicting harm that could cause death  Notification Required: No need or identified person  Additional Information for Danger to Others Potential: Previous attempts  Additional Comments for Danger to Others  Potential: n/a  Are There Guns or Other Weapons in Your Home? No  Types of Guns/Weapons: n/a  Are These Weapons Safely Secured?                            No  Who Could Verify You Are Able To Have These Secured: n/a  Do You Have any Outstanding Charges, Pending Court Dates, Parole/Probation? None reported  Contacted To Inform of Risk of Harm To Self or Others: Other: Comment (n/a)   Does Patient Present under Involuntary Commitment? No    County of Residence: [place]   Patient Currently Receiving the Following Services: Group Home; Medication Management   Determination of Need: Emergent (2 hours)   Options For Referral: ED Visit   Disposition Recommendation per psychiatric provider: Pending psych consult.   Duffy Dantonio R Garren Greenman, LCAS

## 2023-06-02 NOTE — BH Assessment (Addendum)
 TTS (Jassime F.) contacted Iris via phone call and secure chat about consult.

## 2023-06-14 ENCOUNTER — Emergency Department
Admission: EM | Admit: 2023-06-14 | Discharge: 2023-06-14 | Disposition: A | Discharge status: Home / Self Care | Payer: MEDICAID

## 2023-06-14 NOTE — ED Triage Notes (Signed)
 First Nurse Note;  Member of group home with patient now, officers brought in pt voluntarily. Caregiver said that she does not need to be seen at this time and pt agreed. Pt voluntarily walked out with caregiver

## 2023-06-16 ENCOUNTER — Other Ambulatory Visit: Payer: Self-pay

## 2023-06-16 ENCOUNTER — Emergency Department
Admission: EM | Admit: 2023-06-16 | Discharge: 2023-06-22 | Disposition: A | Discharge status: Other Acute Inpatient Hospital | Payer: MEDICAID | Attending: Emergency Medicine | Admitting: Emergency Medicine

## 2023-06-16 ENCOUNTER — Emergency Department: Payer: MEDICAID

## 2023-06-16 DIAGNOSIS — R45851 Suicidal ideations: Secondary | ICD-10-CM | POA: Insufficient documentation

## 2023-06-16 DIAGNOSIS — F333 Major depressive disorder, recurrent, severe with psychotic symptoms: Secondary | ICD-10-CM | POA: Insufficient documentation

## 2023-06-16 DIAGNOSIS — Z87891 Personal history of nicotine dependence: Secondary | ICD-10-CM | POA: Diagnosis not present

## 2023-06-16 DIAGNOSIS — Z7901 Long term (current) use of anticoagulants: Secondary | ICD-10-CM | POA: Diagnosis not present

## 2023-06-16 DIAGNOSIS — S8001XA Contusion of right knee, initial encounter: Secondary | ICD-10-CM | POA: Insufficient documentation

## 2023-06-16 DIAGNOSIS — W010XXA Fall on same level from slipping, tripping and stumbling without subsequent striking against object, initial encounter: Secondary | ICD-10-CM | POA: Insufficient documentation

## 2023-06-16 DIAGNOSIS — Z23 Encounter for immunization: Secondary | ICD-10-CM | POA: Diagnosis not present

## 2023-06-16 DIAGNOSIS — S8991XA Unspecified injury of right lower leg, initial encounter: Secondary | ICD-10-CM | POA: Diagnosis present

## 2023-06-16 DIAGNOSIS — F603 Borderline personality disorder: Secondary | ICD-10-CM | POA: Insufficient documentation

## 2023-06-16 DIAGNOSIS — F331 Major depressive disorder, recurrent, moderate: Secondary | ICD-10-CM | POA: Diagnosis not present

## 2023-06-16 DIAGNOSIS — S80211A Abrasion, right knee, initial encounter: Secondary | ICD-10-CM

## 2023-06-16 DIAGNOSIS — F79 Unspecified intellectual disabilities: Secondary | ICD-10-CM | POA: Diagnosis not present

## 2023-06-16 DIAGNOSIS — F3481 Disruptive mood dysregulation disorder: Secondary | ICD-10-CM | POA: Diagnosis present

## 2023-06-16 LAB — CBC
HCT: 37 % (ref 36.0–46.0)
Hemoglobin: 10.8 g/dL — ABNORMAL LOW (ref 12.0–15.0)
MCH: 22.7 pg — ABNORMAL LOW (ref 26.0–34.0)
MCHC: 29.2 g/dL — ABNORMAL LOW (ref 30.0–36.0)
MCV: 77.7 fL — ABNORMAL LOW (ref 80.0–100.0)
Platelets: 291 10*3/uL (ref 150–400)
RBC: 4.76 MIL/uL (ref 3.87–5.11)
RDW: 18.2 % — ABNORMAL HIGH (ref 11.5–15.5)
WBC: 8.2 10*3/uL (ref 4.0–10.5)
nRBC: 0 % (ref 0.0–0.2)

## 2023-06-16 LAB — URINE DRUG SCREEN, QUALITATIVE (ARMC ONLY)
Amphetamines, Ur Screen: NOT DETECTED
Barbiturates, Ur Screen: NOT DETECTED
Benzodiazepine, Ur Scrn: POSITIVE — AB
Cannabinoid 50 Ng, Ur ~~LOC~~: NOT DETECTED
Cocaine Metabolite,Ur ~~LOC~~: NOT DETECTED
MDMA (Ecstasy)Ur Screen: NOT DETECTED
Methadone Scn, Ur: NOT DETECTED
Opiate, Ur Screen: NOT DETECTED
Phencyclidine (PCP) Ur S: NOT DETECTED
Tricyclic, Ur Screen: NOT DETECTED

## 2023-06-16 LAB — COMPREHENSIVE METABOLIC PANEL WITH GFR
ALT: 10 U/L (ref 0–44)
AST: 15 U/L (ref 15–41)
Albumin: 2.8 g/dL — ABNORMAL LOW (ref 3.5–5.0)
Alkaline Phosphatase: 55 U/L (ref 38–126)
Anion gap: 10 (ref 5–15)
BUN: 9 mg/dL (ref 6–20)
CO2: 24 mmol/L (ref 22–32)
Calcium: 8.7 mg/dL — ABNORMAL LOW (ref 8.9–10.3)
Chloride: 103 mmol/L (ref 98–111)
Creatinine, Ser: 0.84 mg/dL (ref 0.44–1.00)
GFR, Estimated: >60 mL/min (ref >60)
Glucose, Bld: 127 mg/dL — ABNORMAL HIGH (ref 70–99)
Potassium: 3.5 mmol/L (ref 3.5–5.1)
Sodium: 137 mmol/L (ref 135–145)
Total Bilirubin: 0.4 mg/dL (ref 0.0–1.2)
Total Protein: 6.9 g/dL (ref 6.5–8.1)

## 2023-06-16 LAB — VALPROIC ACID LEVEL: Valproic Acid Lvl: 71 ug/mL (ref 50–100)

## 2023-06-16 LAB — SALICYLATE LEVEL: Salicylate Lvl: <7 mg/dL — ABNORMAL LOW (ref 7.0–30.0)

## 2023-06-16 LAB — ETHANOL: Alcohol, Ethyl (B): <15 mg/dL (ref <15)

## 2023-06-16 LAB — ACETAMINOPHEN LEVEL: Acetaminophen (Tylenol), Serum: <10 ug/mL — ABNORMAL LOW (ref 10–30)

## 2023-06-16 LAB — PREGNANCY, URINE: Preg Test, Ur: NEGATIVE

## 2023-06-16 MED ORDER — ESCITALOPRAM OXALATE 10 MG PO TABS
10.0000 mg | ORAL_TABLET | Freq: Every day | ORAL | Status: DC
  Administered 2023-06-17 – 2023-06-22 (×6): 10 mg via ORAL
  Filled 2023-06-16 (×6): qty 1

## 2023-06-16 MED ORDER — APIXABAN 5 MG PO TABS
5.0000 mg | ORAL_TABLET | Freq: Two times a day (BID) | ORAL | Status: DC
  Administered 2023-06-16 – 2023-06-22 (×12): 5 mg via ORAL
  Filled 2023-06-16 (×12): qty 1

## 2023-06-16 MED ORDER — LORAZEPAM 1 MG PO TABS
1.0000 mg | ORAL_TABLET | Freq: Three times a day (TID) | ORAL | Status: DC
  Administered 2023-06-16 – 2023-06-22 (×17): 1 mg via ORAL
  Filled 2023-06-16 (×17): qty 1

## 2023-06-16 MED ORDER — PRAZOSIN HCL 1 MG PO CAPS
2.0000 mg | ORAL_CAPSULE | Freq: Every day | ORAL | Status: DC
  Administered 2023-06-17 – 2023-06-21 (×5): 2 mg via ORAL
  Filled 2023-06-16 (×2): qty 2
  Filled 2023-06-16: qty 1
  Filled 2023-06-16 (×2): qty 2
  Filled 2023-06-16: qty 1
  Filled 2023-06-16: qty 2

## 2023-06-16 MED ORDER — ALBUTEROL SULFATE HFA 108 (90 BASE) MCG/ACT IN AERS: 2.0000 | INHALATION_SPRAY | Freq: Four times a day (QID) | RESPIRATORY_TRACT | Status: DC | PRN

## 2023-06-16 MED ORDER — CLOZAPINE 25 MG PO TABS
25.0000 mg | ORAL_TABLET | Freq: Two times a day (BID) | ORAL | Status: DC
  Administered 2023-06-16 – 2023-06-22 (×12): 25 mg via ORAL
  Filled 2023-06-16 (×12): qty 1

## 2023-06-16 MED ORDER — PANTOPRAZOLE SODIUM 40 MG PO TBEC
40.0000 mg | DELAYED_RELEASE_TABLET | Freq: Every day | ORAL | Status: DC
  Administered 2023-06-17 – 2023-06-22 (×6): 40 mg via ORAL
  Filled 2023-06-16 (×6): qty 1

## 2023-06-16 MED ORDER — TETANUS-DIPHTH-ACELL PERTUSSIS 5-2.5-18.5 LF-MCG/0.5 IM SUSY
0.5000 mL | PREFILLED_SYRINGE | Freq: Once | INTRAMUSCULAR | Status: AC
  Administered 2023-06-16: 0.5 mL via INTRAMUSCULAR
  Filled 2023-06-16: qty 0.5

## 2023-06-16 MED ORDER — LORATADINE 10 MG PO TABS
10.0000 mg | ORAL_TABLET | Freq: Every day | ORAL | Status: DC
  Administered 2023-06-17 – 2023-06-22 (×6): 10 mg via ORAL
  Filled 2023-06-16 (×6): qty 1

## 2023-06-16 MED ORDER — FLUTICASONE PROPIONATE 50 MCG/ACT NA SUSP: 1.0000 | Freq: Two times a day (BID) | NASAL | Status: DC | PRN

## 2023-06-16 MED ORDER — DIVALPROEX SODIUM ER 500 MG PO TB24
1000.0000 mg | ORAL_TABLET | Freq: Two times a day (BID) | ORAL | Status: DC
  Administered 2023-06-16 – 2023-06-22 (×12): 1000 mg via ORAL
  Filled 2023-06-16: qty 2
  Filled 2023-06-16: qty 4
  Filled 2023-06-16 (×9): qty 2

## 2023-06-16 MED ORDER — FOLIC ACID 1 MG PO TABS
1.0000 mg | ORAL_TABLET | Freq: Every day | ORAL | Status: DC
  Administered 2023-06-17 – 2023-06-22 (×6): 1 mg via ORAL
  Filled 2023-06-16 (×6): qty 1

## 2023-06-16 MED ORDER — LEVOTHYROXINE SODIUM 25 MCG PO TABS
50.0000 ug | ORAL_TABLET | Freq: Every day | ORAL | Status: DC
  Administered 2023-06-17 – 2023-06-22 (×6): 50 ug via ORAL
  Filled 2023-06-16 (×4): qty 2
  Filled 2023-06-16: qty 1
  Filled 2023-06-16: qty 2

## 2023-06-16 MED ORDER — METOPROLOL SUCCINATE ER 50 MG PO TB24
50.0000 mg | ORAL_TABLET | Freq: Every day | ORAL | Status: DC
  Administered 2023-06-17 – 2023-06-22 (×6): 50 mg via ORAL
  Filled 2023-06-16 (×6): qty 1

## 2023-06-16 MED ORDER — ACETAMINOPHEN 500 MG PO TABS
1000.0000 mg | ORAL_TABLET | Freq: Once | ORAL | Status: AC
  Administered 2023-06-16: 1000 mg via ORAL
  Filled 2023-06-16: qty 2

## 2023-06-16 MED ORDER — LEVETIRACETAM 500 MG PO TABS
500.0000 mg | ORAL_TABLET | Freq: Two times a day (BID) | ORAL | Status: DC
  Administered 2023-06-16 – 2023-06-22 (×12): 500 mg via ORAL
  Filled 2023-06-16 (×13): qty 1

## 2023-06-16 MED ORDER — TRAZODONE HCL 100 MG PO TABS
300.0000 mg | ORAL_TABLET | Freq: Every day | ORAL | Status: DC
  Administered 2023-06-16 – 2023-06-21 (×6): 300 mg via ORAL
  Filled 2023-06-16 (×6): qty 3

## 2023-06-16 MED ORDER — MECLIZINE HCL 25 MG PO TABS: 25.0000 mg | ORAL_TABLET | Freq: Three times a day (TID) | ORAL | Status: DC | PRN

## 2023-06-16 MED ORDER — RISPERIDONE 1 MG PO TABS
1.0000 mg | ORAL_TABLET | Freq: Two times a day (BID) | ORAL | Status: DC
  Administered 2023-06-16 – 2023-06-22 (×12): 1 mg via ORAL
  Filled 2023-06-16 (×12): qty 1

## 2023-06-16 MED ORDER — HYDROCHLOROTHIAZIDE 25 MG PO TABS
25.0000 mg | ORAL_TABLET | Freq: Every day | ORAL | Status: DC
  Administered 2023-06-17 – 2023-06-22 (×6): 25 mg via ORAL
  Filled 2023-06-16 (×6): qty 1

## 2023-06-16 MED ORDER — DOCUSATE SODIUM 100 MG PO CAPS
100.0000 mg | ORAL_CAPSULE | Freq: Two times a day (BID) | ORAL | Status: DC
  Administered 2023-06-16 – 2023-06-22 (×12): 100 mg via ORAL
  Filled 2023-06-16 (×12): qty 1

## 2023-06-16 MED ORDER — FLUOXETINE HCL 20 MG PO CAPS
40.0000 mg | ORAL_CAPSULE | Freq: Every day | ORAL | Status: DC
  Administered 2023-06-17 – 2023-06-22 (×6): 40 mg via ORAL
  Filled 2023-06-16 (×6): qty 2

## 2023-06-16 NOTE — ED Notes (Signed)
 Pt belongings:  Pink shirt Flower shorts [employer]

## 2023-06-16 NOTE — ED Triage Notes (Signed)
 Pt from [facility] for SI. Pt states her plan was to cut herself. Pt pointed to faded scars of cuts on her arm pt said are about a month old. Pt has "eczema" rash on right knee and superficial scratch from "a fall" per her report. Pt states she does not feel safe at her GH. States they deadbolt her room and put "wire fence around my windows."

## 2023-06-16 NOTE — BH Assessment (Signed)
 Assessment Note  Sherry Bryan is an 28 y.o. female presenting voluntarily from her group home with a plan to cut herself.  Patient continues to reports not liking her group home environment and states that the group home staff are mean to her.  She states locks and cameras have been installed on the doors and windows.  Patient reports a plan to run out in traffic if she does not find another group home.    Patient reports she did follow up with [facility], as recommended from her last ED visit.  She says she has completed her intake but has not been assigned to a therapist.  Patient denies any other complaints the the present time.  Diagnosis: Borderline Personality Disorder  Past Medical History:  Past Medical History:  Diagnosis Date   Borderline personality disorder (HCC)    Constipation 05/15/2015   Depression    DMDD (disruptive mood dysregulation disorder) (HCC) 05/15/2015   History of seasonal allergies 05/15/2015   Hx of gastroesophageal reflux (GERD) 05/15/2015   Hx of seizure disorder 05/15/2015   Intellectual disability 05/22/2015   PTSD (post-traumatic stress disorder)    Seizures (HCC)    last one in 2015   Suicidal ideation     No past surgical history on file.  Family History: No family history on file.  Social History:  reports that she has quit smoking. Her smoking use included cigarettes. She has never used smokeless tobacco. She reports that she does not drink alcohol and does not use drugs.  Additional Social History:     CIWA: CIWA-Ar BP: 101/60 Pulse Rate: 96 COWS:    Allergies:  Allergies  Allergen Reactions   Ritalin [Methylphenidate Hcl] Other (See Comments)    seizures   Abilify [Aripiprazole] Other (See Comments)    Shaking or tremors   Lithium      Home Medications: (Not in a hospital admission)   OB/GYN Status:  Patient's last menstrual period was 05/01/2023 (approximate).  General Assessment Data Location of Assessment: [facility] ED     Crisis Care  Plan Legal Guardian: Other: ([place] DSS)    Abuse/Neglect Assessment (Assessment to be complete while patient is alone) Physical Abuse: Denies Verbal Abuse: Denies Sexual Abuse: Denies Exploitation of patient/patient's resources: Denies Self-Neglect: Denies     Merchant navy officer (For Healthcare) Does Patient Have a Medical Advance Directive?: No     Disposition: Pending IRIS consult    On Site Evaluation by:   Reviewed with Physician:    Zollie Hipp 06/16/2023 10:47 PM

## 2023-06-16 NOTE — ED Provider Notes (Addendum)
 [facility] Provider Note    Event Date/Time   First MD Initiated Contact with Patient 06/16/23 1758     (approximate)   History   Suicidal   HPI  Sherry Bryan is a 28 y.o. female who comes in from her group home due to concern for SI.  Patient's plan was to cut herself.  Patient reports that she does not feel safe at her home.  Patient also reports that she has a plan to jump in front of a car.  She denies attempting this today.  She does report a mechanical fall yesterday where she slipped and fell and landed onto her right knee.  She denies hitting her head she reports acting her normal self and denies any new neck pain or other injuries.  She just reports pain in her right knee.  Physical Exam   Triage Vital Signs: ED Triage Vitals [06/16/23 1802]  Encounter Vitals Group     BP      Systolic BP Percentile      Diastolic BP Percentile      Pulse      Resp      Temp      Temp src      SpO2      Weight      Height      Head Circumference      Peak Flow      Pain Score 5     Pain Loc      Pain Education      Exclude from Growth Chart     Most recent vital signs: Vitals:   06/16/23 1817  BP: 114/79  Pulse: 96  Resp: 20  Temp: 98.3 F (36.8 C)  SpO2: 95%     General: Awake, no distress.  CV:  Good peripheral perfusion.  Resp:  Normal effort.  Abd:  No distention.  Other:  Abrasion to the right knee with some bruising noted.  No new C-spine tenderness full range of motion of neck no numbness no tingling no hematoma noted of the head   ED Results / Procedures / Treatments   Labs (all labs ordered are listed, but only abnormal results are displayed) Labs Reviewed  COMPREHENSIVE METABOLIC PANEL WITH GFR  ETHANOL  CBC  URINE DRUG SCREEN, QUALITATIVE ([facility] ONLY)  POC URINE PREG, ED     RADIOLOGY I have reviewed the xray personally and interpreted no fracture   PROCEDURES:  Critical Care performed:  No  Procedures   MEDICATIONS ORDERED IN ED: Medications - No data to display   IMPRESSION / MDM / ASSESSMENT AND PLAN / ED COURSE  I reviewed the triage vital signs and the nursing notes.   Patient's presentation is most consistent with acute presentation with potential threat to life or bodily function.   Patient comes in voluntary with SI from a group home.  Patient does report mechanical fall yesterday no injury to her head and no evidence of trauma on examination of her head or C-spine.  She does have some bruising noted on her right knee and I will get x-ray to evaluate this but she has been ambulatory since the fall.   No exam findings to suggest medical cause of current presentation. Will order psychiatric screening labs and discuss further w/ psychiatric service.  D/d includes but is not limited to psychiatric disease, behavioral/personality disorder, inadequate socioeconomic support, medical.  Based on HPI, exam, unremarkable labs, no concern for acute medical problem at  this time. No rigidity, clonus, hyperthermia, focal neurologic deficit, diaphoresis, tachycardia, meningismus, ataxia, gait abnormality or other finding to suggest this visit represents a non-psychiatric problem. Screening labs reviewed.    Given this, pt medically cleared, to be dispositioned per Psych.    The patient has been placed in psychiatric observation due to the need to provide a safe environment for the patient while obtaining psychiatric consultation and evaluation, as well as ongoing medical and medication management to treat the patient's condition.  The patient has not been placed under full IVC at this time.    7:57 PM I went to order patient's home medications and noted that patient is on Eliquis .  She reports being on Eliquis  since 2021 after she had blood clots in multiple areas given she does report a fall and has an abrasion on her right knee and may be not the best historian I do want to  get a CT scan to ensure no evidence of intracranial hemorrhage may also get CT cervical did not check given she did report a little bit of mild pain here.  CT head and neck negative- pt medically cleared   FINAL CLINICAL IMPRESSION(S) / ED DIAGNOSES   Final diagnoses:  Abrasion of right knee, initial encounter  Suicidal ideation     Rx / DC Orders   ED Discharge Orders     None        Note:  This document was prepared using Dragon voice recognition software and may include unintentional dictation errors.   Lubertha Rush, MD 06/16/23 1830    Lubertha Rush, MD 06/16/23 Carollynn Cirri    Lubertha Rush, MD 06/16/23 2127

## 2023-06-17 ENCOUNTER — Encounter: Payer: Self-pay | Admitting: Psychiatric/Mental Health

## 2023-06-17 DIAGNOSIS — F331 Major depressive disorder, recurrent, moderate: Secondary | ICD-10-CM

## 2023-06-17 DIAGNOSIS — F603 Borderline personality disorder: Secondary | ICD-10-CM

## 2023-06-17 DIAGNOSIS — R45851 Suicidal ideations: Secondary | ICD-10-CM

## 2023-06-17 DIAGNOSIS — F79 Unspecified intellectual disabilities: Secondary | ICD-10-CM

## 2023-06-17 MED ORDER — OLANZAPINE 10 MG IM SOLR: 10.0000 mg | Freq: Once | INTRAMUSCULAR | Status: DC | PRN

## 2023-06-17 NOTE — ED Notes (Signed)
 Served pt lunch with water 

## 2023-06-17 NOTE — ED Notes (Signed)
 Pt refused breakfast tray for now.

## 2023-06-17 NOTE — ED Notes (Signed)
 Patient is vol pending possible D/C

## 2023-06-17 NOTE — ED Provider Notes (Signed)
 Emergency Medicine Observation Re-evaluation Note  Sherry Bryan is a 28 y.o. female, seen on rounds today.  Pt initially presented to the ED for complaints of Suicidal Currently, the patient is resting comfortably, no issues overnight.  Physical Exam  BP (!) 121/94 (BP Location: Right Arm)   Pulse 94   Temp 98.1 F (36.7 C) (Oral)   Resp 18   LMP 05/01/2023 (Approximate)   SpO2 96%  Physical Exam Patient appears well, no acute distress, normal WOB    ED Course / MDM  EKG:   I have reviewed the labs performed to date as well as medications administered while in observation.  Recent changes in the last 24 hours include none.  Plan  Current plan is for reassessment by psychiatry.    Twilla Galea, MD 06/17/23 1438

## 2023-06-17 NOTE — ED Provider Notes (Signed)
 Emergency Medicine Observation Re-evaluation Note  Sherry Bryan is a 28 y.o. female, seen on rounds today.  Pt initially presented to the ED for complaints of Suicidal Currently, the patient is resting, voices no medical compl likely betteraints/better.  Physical Exam  BP 120/68 (BP Location: Right Arm)   Pulse 86   Temp 97.7 F (36.5 C) (Oral)   Resp 18   LMP 05/01/2023 (Approximate)   SpO2 96%  Physical Exam General: Resting in NAD Cardiac: No cyanosis Lungs: Equal rise and fall Psych: Not agitated  ED Course / MDM  EKG:   I have reviewed the labs performed to date as well as medications administered while in observation.  Recent changes in the last 24 hours include no events overnight.  Plan  Current plan is for psychiatric reassessment this morning.    Ennio Houp J, MD 06/17/23 [phone]

## 2023-06-17 NOTE — ED Notes (Signed)
 Provided pt with shower supplies, new scrubs and nonstick socks.

## 2023-06-17 NOTE — ED Notes (Signed)
 vol/NP recommend overnight observation and re-assessment in the morning.

## 2023-06-17 NOTE — Consult Note (Signed)
 [facility] Consult Follow-up  Patient Name: .Sherry Bryan  MRN: [id number]  DOB: [birth year]  Consult Order details:  Orders (From admission, onward)     Start     Ordered   06/16/23 1829  IP CONSULT TO PSYCHIATRY       Ordering Provider: Lubertha Rush, MD  Provider:  (Not yet assigned)  Question Answer Comment  Place call to: [phone]   Reason for Consult Admit      06/16/23 1828   06/16/23 1828  CONSULT TO CALL ACT TEAM       Ordering Provider: Lubertha Rush, MD  Provider:  (Not yet assigned)  Question:  Reason for Consult?  Answer:  Psych consult   06/16/23 1828             Mode of Visit: Tele-visit Virtual Statement:TELE PSYCHIATRY ATTESTATION & CONSENT As the provider for this telehealth consult, I attest that I verified the patient's identity using two separate identifiers, introduced myself to the patient, provided my credentials, disclosed my location, and performed this encounter via a HIPAA-compliant, real-time, face-to-face, two-way, interactive audio and video platform and with the full consent and agreement of the patient (or guardian as applicable.) Patient physical location: [facility]. Telehealth provider physical location: home office in state of [place].   Video start time: 1245 Video end time: 1319    Psychiatry Consult Evaluation  Service Date: June 17, 2023 LOS:  LOS: 0 days  Chief Complaint "I'm feeling pretty good."  Primary Psychiatric Diagnoses  1.Disruptive Mood Dysregulation Disorder 2. Intellectual Disability 3.  Borderline Personality Disorder  Assessment  Sherry Bryan is a 28 y.o. female admitted: Presented to the EDfor 06/16/2023  5:52 PM voluntarily for evaluation of suicidal ideation. She carries the psychiatric diagnoses of MDD, DMDD, Borderline Personality Disorder, IDD and has a past medical history of Morbid Obesity, Allergic Rhinitis, Lithium  Toxicity  Since admission, she as been medically cleared.  Patient  has been cooperative and has not demonstrated any behavioral concerns.  She was restarted on her home medications, no sleep or appetite concerns.  On assessment today, she is appropriately groomed and dressed in hospital scrubs.  She is not ill appearing and her appearance is not disheveled.  Patient is alert and oriented x5; She is cooperative and engaged in assessment.  She has hx for IDD but able to articulate her concerns, no apparent deficits noted.  Her thoughts are coherent and goal oriented, devoid of psychosis, delusion or paranoia.  Her previously reported suicidal ideation has improved, she denies plan or intent for self harm and contracts for safety.  She verbalizes the ability to use coping skills to calm down when feeling overwhelmed.  She has a  hx for impulsive behaviors which may be exacerbated by IDD and ineffective problem solving skills but her judgment and insight was intact today.   Considering above, As per above, patient does not meet criteria for involuntary psychiatric admissions and states she is not interested in voluntary admissions.  Safety planning completed with patient and group home owner, Grady Lawman, who does not have safety concerns with taking patient home today. She is psych cleared and recommended he follow up with outpatient care with [facility] for psychiatry for medication management.    Diagnoses:  Active Hospital problems: Principal Problem:   Disruptive mood dysregulation disorder (HCC) Active Problems:   Intellectual disability   Borderline personality disorder (HCC)    Plan   ## Psychiatric Medication Recommendations:  Continue  home medications, no changes made today  ## Medical Decision Making Capacity: Patient has a guardian and has thus been adjudicated incompetent; please involve patients guardian in medical decision making  ## Further Work-up:  -- Deferred to Attending While pt on Qtc prolonging medications, please monitor & replete K+  to 4 and Mg2+ to 2 -- most recent EKG on 06/06/2023 had QtC of 454 -- Pertinent labwork reviewed earlier this admission includes: CMP,CBC, UDS   ## Disposition:-- There are no psychiatric contraindications to discharge at this time  ## Behavioral / Environmental: - No specific recommendations at this time.     ## Safety and Observation Level:  - Based on my clinical evaluation, I estimate the patient to be at low risk of self harm in the current setting. - At this time, we recommend  routine. This decision is based on my review of the chart including patient's history and current presentation, interview of the patient, mental status examination, and consideration of suicide risk including evaluating suicidal ideation, plan, intent, suicidal or self-harm behaviors, risk factors, and protective factors. This judgment is based on our ability to directly address suicide risk, implement suicide prevention strategies, and develop a safety plan while the patient is in the clinical setting. Please contact our team if there is a concern that risk level has changed.  CSSR Risk Category:C-SSRS RISK CATEGORY: No Risk  Suicide Risk Assessment: Patient has following modifiable risk factors for suicide: recklessness, which we are addressing by recommending overnight observation and AM psychiatric reassessment for safety monitoring. Patient has following non-modifiable or demographic risk factors for suicide: hx of high emergency department utilization and borderline personality disorder Patient has the following protective factors against suicide: Access to outpatient mental health care and Supportive friends  Thank you for this consult request. Recommendations have been communicated to the primary team.  We will sign off at this time.   Doneen Fuelling, NP       History of Present Illness  Relevant Aspects of Hospital ED Course:  Admitted on 06/16/2023 for  Per RN Triage Note dated 06/16/2023@1759 : "Pt  from [facility] for SI. Pt states her plan was to cut herself. Pt pointed to faded scars of cuts on her arm pt said are about a month old. Pt has "eczema" rash on right knee and superficial scratch from "a fall" per her report. Pt states she does not feel safe at her GH. States they deadbolt her room and put "wire fence around my windows."   Patient Report 06/17/2023:  Patient is observed sitting on chair is supply closet; she is appropriately groomed and dressed in hospital scrubs.  She is not ill appearing and her appearance is not disheveled. Patient greeted by psych team and given anticipatory guidance.  She agrees to continue assessment.  Patient states her previously reported suicidal thoughts have improved and she no longer has suicidal thoughts.  When asked what has changed since admission, she states, "Since being here I had a chance to calm down and work on myself."  She states she previously reported access to a razor hidden in her room but today tells me she made that up because she was upset.  She verbalizes the ability to use coping skills, "deep breathing, hot or cold shower, writing, mindfulness, reading" when feeling overwhelmed.  She denies concerns of abuse at her group home and states she gets along well with staff members.   Patient denies SI/HI, or AVH.  She reports sleep  and appetite are good.  She has been med compliant since admission and denies side effects; no involuntary movements observed today. Patient given the opportunity to asks questions and asks, "when can I go home?"  Per ED Provider Admission Assessment 06/16/2023@1758  History    Suicidal     HPI   Mikeyla Music is a 28 y.o. female who comes in from her group home due to concern for SI.  Patient's plan was to cut herself.  Patient reports that she does not feel safe at her home.  Patient also reports that she has a plan to jump in front of a car.  She denies attempting this today.  She does report a mechanical  fall yesterday where she slipped and fell and landed onto her right knee.  She denies hitting her head she reports acting her normal self and denies any new neck pain or other injuries.  She just reports pain in her right knee.  Psych ROS:  Depression: past and current Anxiety:  past and present Mania (lifetime and current): pt denies Psychosis: (lifetime and current): past  Collateral information:  Attempted to reach patient's legal guardian, Lorrin Rollings @[phone], message left inviting call back to this Clinical research associate.   Contacted Grady Lawman, group home owner at [phone], group home owner.  She reports patient is treatment savvy and is aware of what to say to get to the hospital.  She reports patient does not like to be told no and usually says she is suicidal or behaves in a manner "to get attention."  She states patient has a hx for chronic suicidal ideation but usually does not do anything to hurt herself.  She has made safety adjustments in the home to help keep the patient safe and to stop her from breaking windows and patient does not like it.   She states although patient claims she has a razor in her room, this is not true because she does not allow patient to have access to anything that can be used as a weapon.    She reports patient is followed by Mil[facility] Alley, recently saw her psychiatrist on June 6th.  She was started on clozapine  25mg  po BID.  She states the psychiatrist is working with them to get her mood stabilized and she has a follow up visit scheduled for June 13th for labs.    She states she visited the patient today and does not have safety concerns with patient returning to the group home today.  She is informed patient is psych cleared and she can pick her up now.  Ms. Abigail Abler states she has to work out staffing concerns and will call the hospital back tonight with the time she can pick patient up.  Review of Systems  Constitutional: Negative.   HENT:  Negative.    Eyes: Negative.   Respiratory: Negative.    Cardiovascular: Negative.   Gastrointestinal: Negative.   Musculoskeletal: Negative.   Neurological: Negative.   Endo/Heme/Allergies: Negative.   Psychiatric/Behavioral:  Negative for depression, hallucinations and suicidal ideas (have improved since admission). The patient is not nervous/anxious.      Psychiatric and Social History  Psychiatric History:  Information collected from patient, chart review and Grady Lawman, [facility] owner.  Prev Dx/Sx: MDD, DMDD. GERD, Homicidal ideations, Borderline Personality Disorder, Aggressive behaviors of adult Current Psych Provider: [facility] Meds (current): as listed below Previous Med Trials: deferred Therapy: denies  Prior Psych Hospitalization: patient has high utilization of the  emergency room but she denies psych admission   Prior Self Harm: no Prior Violence: yes  Family Psych History: unknown Family Hx suicide: unknown  Social History:  Developmental Hx: hx for IDD Educational Hx: deferred Occupational Hx: pt is unemployed Armed forces operational officer Hx: denies Living Situation: lives at [facility] Spiritual Hx: pt denies Access to weapons/lethal means: no   Substance History Alcohol: no  Tobacco: no Illicit drugs: pt denies Prescription drug abuse: pt denies Rehab hx: n/a  Exam Findings  Physical Exam:  Vital Signs:  Temp:  [97.7 F (36.5 C)-98.6 F (37 C)] 98.6 F (37 C) (06/07 1550) Pulse Rate:  [83-94] 83 (06/07 1550) Resp:  [17-18] 17 (06/07 1550) BP: (101-132)/(60-94) 132/89 (06/07 1550) SpO2:  [90 %-96 %] 90 % (06/07 1550) Blood pressure 132/89, pulse 83, temperature 98.6 F (37 C), temperature source Oral, resp. rate 17, last menstrual period 05/01/2023, SpO2 90%. There is no height or weight on file to calculate BMI.  Physical Exam Vitals and nursing note reviewed.  Constitutional:      Appearance: She is obese.   Cardiovascular:     Rate and Rhythm: Normal rate.     Pulses: Normal pulses.  Musculoskeletal:        General: Normal range of motion.     Cervical back: Normal range of motion.  Neurological:     Mental Status: She is alert and oriented to person, place, and time. Mental status is at baseline.  Psychiatric:        Attention and Perception: Attention and perception normal.        Mood and Affect: Mood and affect normal.        Speech: Speech normal.        Behavior: Behavior normal. Behavior is cooperative.        Thought Content: Thought content normal. Thought content is not paranoid or delusional. Thought content does not include homicidal or suicidal ideation. Thought content does not include homicidal or suicidal plan.        Cognition and Memory: Cognition normal.        Judgment: Judgment is impulsive.     Mental Status Exam: General Appearance: Casual and Fairly Groomed  Orientation:  Full (Time, Place, and Person)  Memory:  Immediate;   Good Recent;   Good Remote;   Good  Concentration:  Concentration: Good and Attention Span: Good  Recall:  Good  Attention  Good  Eye Contact:  Good  Speech:  Clear and Coherent and Normal Rate  Language:  Good  Volume:  Normal  Mood: "I'm feeling pretty good"  Affect:  Appropriate and Congruent  Thought Process:  Goal Directed  Thought Content:  Logical  Suicidal Thoughts:  No  Homicidal Thoughts:  No  Judgement:  Other:  patient has IDD and is impulsive at baseline but judgment intact during assessment.  Insight:  Fair  Psychomotor Activity:  Normal  Akathisia:  No  Fund of Knowledge:  Good      Assets:  Communication Skills Desire for Improvement Financial Resources/Insurance Housing Social Support  Cognition:  WNL  ADL's:  Intact  AIMS (if indicated):        Other History   These have been pulled in through the EMR, reviewed, and updated if appropriate.  Family History:  The patient's family history is not on  file.  Medical History: Past Medical History:  Diagnosis Date   Borderline personality disorder (HCC)    Constipation 05/15/2015   Depression  DMDD (disruptive mood dysregulation disorder) (HCC) 05/15/2015   History of seasonal allergies 05/15/2015   Hx of gastroesophageal reflux (GERD) 05/15/2015   Hx of seizure disorder 05/15/2015   Intellectual disability 05/22/2015   PTSD (post-traumatic stress disorder)    Seizures (HCC)    last one in 2015   Suicidal ideation     Surgical History: History reviewed. No pertinent surgical history.   Medications:   Current Facility-Administered Medications:    albuterol  (VENTOLIN  HFA) 108 (90 Base) MCG/ACT inhaler 2 puff, 2 puff, Inhalation, Q6H PRN, Lubertha Rush, MD   apixaban  (ELIQUIS ) tablet 5 mg, 5 mg, Oral, BID, Lubertha Rush, MD, 5 mg at 06/17/23 1610   cloZAPine  (CLOZARIL ) tablet 25 mg, 25 mg, Oral, BID, Funke, Mary E, MD, 25 mg at 06/17/23 1026   divalproex  (DEPAKOTE  ER) 24 hr tablet 1,000 mg, 1,000 mg, Oral, BID, Lubertha Rush, MD, 1,000 mg at 06/17/23 0950   docusate sodium  (COLACE) capsule 100 mg, 100 mg, Oral, BID, Lubertha Rush, MD, 100 mg at 06/17/23 9604   escitalopram  (LEXAPRO ) tablet 10 mg, 10 mg, Oral, Daily, Lubertha Rush, MD, 10 mg at 06/17/23 5409   FLUoxetine  (PROZAC ) capsule 40 mg, 40 mg, Oral, Daily, Lubertha Rush, MD, 40 mg at 06/17/23 0950   fluticasone  (FLONASE ) 50 MCG/ACT nasal spray 1 spray, 1 spray, Each Nare, BID PRN, Lubertha Rush, MD   folic acid  (FOLVITE ) tablet 1 mg, 1 mg, Oral, Daily, Lubertha Rush, MD, 1 mg at 06/17/23 8119   hydrochlorothiazide  (HYDRODIURIL ) tablet 25 mg, 25 mg, Oral, Daily, Lubertha Rush, MD, 25 mg at 06/17/23 1478   levETIRAcetam  (KEPPRA ) tablet 500 mg, 500 mg, Oral, BID, Lubertha Rush, MD, 500 mg at 06/17/23 2956   levothyroxine  (SYNTHROID ) tablet 50 mcg, 50 mcg, Oral, QAC breakfast, Lubertha Rush, MD, 50 mcg at 06/17/23 2130   loratadine  (CLARITIN ) tablet 10 mg, 10 mg, Oral, Daily, Lubertha Rush, MD, 10 mg at 06/17/23 8657   LORazepam  (ATIVAN ) tablet 1 mg, 1 mg, Oral, TID, Lubertha Rush, MD, 1 mg at 06/17/23 1625   meclizine  (ANTIVERT ) tablet 25 mg, 25 mg, Oral, TID PRN, Lubertha Rush, MD   metoprolol  succinate (TOPROL -XL) 24 hr tablet 50 mg, 50 mg, Oral, Daily, Lubertha Rush, MD, 50 mg at 06/17/23 8469   OLANZapine  (ZYPREXA ) injection 10 mg, 10 mg, Intramuscular, Once PRN, Hunt, Katlin E, NP   pantoprazole  (PROTONIX ) EC tablet 40 mg, 40 mg, Oral, Daily, Lubertha Rush, MD, 40 mg at 06/17/23 6295   prazosin  (MINIPRESS ) capsule 2 mg, 2 mg, Oral, QHS, Lubertha Rush, MD   risperiDONE  (RISPERDAL ) tablet 1 mg, 1 mg, Oral, BID, Funke, Mary E, MD, 1 mg at 06/17/23 2841   traZODone  (DESYREL ) tablet 300 mg, 300 mg, Oral, QHS, Lubertha Rush, MD, 300 mg at 06/16/23 2137  Current Outpatient Medications:    apixaban  (ELIQUIS ) 5 MG TABS tablet, Take 5 mg by mouth 2 (two) times daily., Disp: , Rfl:    bacitracin  500 UNIT/GM ointment, Apply 1 Application topically 2 (two) times daily., Disp: 15 g, Rfl: 0   cloZAPine  (CLOZARIL ) 25 MG tablet, Take 25 mg by mouth 2 (two) times daily., Disp: , Rfl:    divalproex  (DEPAKOTE  ER) 500 MG 24 hr tablet, Take 1,000 mg by mouth 2 (two) times daily., Disp: , Rfl:    docusate sodium  (COLACE) 100 MG capsule, Take 100 mg by mouth 2 (two) times  daily. , Disp: , Rfl:    escitalopram  (LEXAPRO ) 10 MG tablet, Take 10 mg by mouth daily., Disp: , Rfl:    FLUoxetine  (PROZAC ) 40 MG capsule, Take 40 mg by mouth daily., Disp: , Rfl:    fluticasone  (FLONASE ) 50 MCG/ACT nasal spray, Place 1 spray into the nose 2 (two) times daily as needed for allergies., Disp: , Rfl:    folic acid  (FOLVITE ) 1 MG tablet, Take 1 mg by mouth daily., Disp: , Rfl:    hydrochlorothiazide  (HYDRODIURIL ) 25 MG tablet, Take 25 mg by mouth daily., Disp: , Rfl:    levETIRAcetam  (KEPPRA ) 500 MG tablet, Take 500 mg by mouth 2 (two) times daily., Disp: , Rfl:    levothyroxine  (SYNTHROID ) 50 MCG tablet,  Take 50 mcg by mouth daily before breakfast., Disp: , Rfl:    loperamide  (IMODIUM  A-D) 2 MG tablet, Take 1 tablet (2 mg total) by mouth 4 (four) times daily as needed for diarrhea or loose stools., Disp: 30 tablet, Rfl: 0   loratadine  (CLARITIN ) 10 MG tablet, Take 10 mg by mouth daily., Disp: , Rfl:    LORazepam  (ATIVAN ) 1 MG tablet, Take 1 mg by mouth 3 (three) times daily., Disp: , Rfl:    meclizine  (ANTIVERT ) 25 MG tablet, Take 25 mg by mouth 3 (three) times daily as needed for nausea., Disp: , Rfl:    metoprolol  succinate (TOPROL -XL) 50 MG 24 hr tablet, Take 50 mg by mouth daily., Disp: , Rfl:    Multiple Vitamins-Minerals (MULTIVITAMIN ADULTS) TABS, Take 1 tablet by mouth daily., Disp: , Rfl:    nystatin  (MYCOSTATIN /NYSTOP ) powder, Apply 1 Bottle topically at bedtime as needed (RASH)., Disp: , Rfl:    omeprazole (PRILOSEC) 40 MG capsule, Take 40 mg by mouth daily., Disp: , Rfl:    prazosin  (MINIPRESS ) 2 MG capsule, Take 2 mg by mouth at bedtime., Disp: , Rfl:    risperiDONE  (RISPERDAL ) 1 MG tablet, Take 1 mg by mouth 2 (two) times daily., Disp: , Rfl:    trazodone  (DESYREL ) 300 MG tablet, Take 300 mg by mouth at bedtime., Disp: , Rfl:    VENTOLIN  HFA 108 (90 Base) MCG/ACT inhaler, Inhale 2 puffs into the lungs every 6 (six) hours as needed for wheezing or shortness of breath., Disp: , Rfl:   Allergies: Allergies  Allergen Reactions   Ritalin [Methylphenidate Hcl] Other (See Comments)    seizures   Abilify [Aripiprazole] Other (See Comments)    Shaking or tremors   Lithium      Doneen Fuelling, NP

## 2023-06-17 NOTE — ED Notes (Signed)
 Served pt dinnner with ginger ale.

## 2023-06-17 NOTE — ED Notes (Signed)
 Served pt breakfast with milk
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
## 2023-06-17 NOTE — Consult Note (Signed)
 TTS attempted to take carts for Re-assessment and neither cart 1 or 2 is working after several restart attempts. TTS completed Urgent IT Ticket informing them that this is delaying patient care.

## 2023-06-17 NOTE — Consult Note (Signed)
 Attempted to see patient via tts cart for psychiatric assessment.  Unfortunately, the cart is not working.  TTS will reach out to IT for further troubleshooting.

## 2023-06-18 NOTE — ED Notes (Signed)
Pt was provided with pm snack.

## 2023-06-18 NOTE — ED Notes (Signed)
 Given lunch and beverage

## 2023-06-18 NOTE — ED Notes (Signed)
 VOL/TOC  Still pending Consult.

## 2023-06-18 NOTE — ED Notes (Signed)
 Provided pt with meal tray and drink. Currently seated in common area with other patients, calm and cooperative.

## 2023-06-18 NOTE — ED Notes (Incomplete)
Patient changed clothes.

## 2023-06-18 NOTE — ED Provider Notes (Signed)
 Emergency Medicine Observation Re-evaluation Note  Sherry Bryan is a 28 y.o. female, seen on rounds today.  Pt initially presented to the ED for complaints of Suicidal Currently, the patient is resting comfortably.  Physical Exam  BP 132/89 (BP Location: Left Arm)   Pulse 83   Temp 98.6 F (37 C) (Oral)   Resp 17   LMP 05/01/2023 (Approximate)   SpO2 90%  Physical Exam General: no acute distress  ED Course / MDM  EKG:   I have reviewed the labs performed to date as well as medications administered while in observation.  Recent changes in the last 24 hours include no acute events.  Plan  Current plan is for psychiatric assessment and re-evaluation today.    Kandee Orion, MD 06/18/23 [phone]

## 2023-06-18 NOTE — ED Notes (Signed)
vol/pending consult.. 

## 2023-06-18 NOTE — ED Notes (Signed)
Pt received meal tray and beverage at this time. 

## 2023-06-18 NOTE — ED Notes (Signed)
 Pt had one episode of vomiting at this time while taking her meds. Pt did not vomit any of her meds up but did vomit clear liquid up. Bed linens and clothes changed.

## 2023-06-18 NOTE — ED Notes (Signed)
Patient received a snack.

## 2023-06-18 NOTE — ED Notes (Signed)
 Patient currently sleeping.  Medication held at this time.  Will medicate once patient is alert and awake to promote rest.

## 2023-06-19 NOTE — ED Notes (Signed)
 Pt provided with lunch tray. Pt is asleep. Tray left at bedside.

## 2023-06-19 NOTE — ED Notes (Signed)
 Pt to door to ask for help making her bed. Pt agreed to shower while nursing staff changed her bed linens. Pt sheets and mattress were saturated. Nursing staff as well as security cleaned mattress thoroughly. Mattress was still too wet to use. Nursing staff switched the mattress with bhu [id number] to provide a dry mattress for pt and allow wet mattress to dry.

## 2023-06-19 NOTE — Consult Note (Signed)
 Patient previously psychiatrically cleared per Chaya Cord NP documentation on 06/17/23 at 1101. ED treatment team made aware for possible discharge f/u, pending medical clearance.

## 2023-06-19 NOTE — ED Provider Notes (Addendum)
 Emergency Medicine Observation Re-evaluation Note  Sherry Bryan is a 28 y.o. female, seen on rounds today.  Pt initially presented to the ED for complaints of Suicidal Currently, the patient is resting in no distress.  Physical Exam  BP 113/61 (BP Location: Right Arm)   Pulse 87   Temp 98.4 F (36.9 C) (Oral)   Resp 19   LMP 05/01/2023 (Approximate)   SpO2 98%  Physical Exam General: resting in no distress   ED Course / MDM  No acute events overnight.   Plan  Current plan is for disposition recommendations per psychiatry and case management.  I was contacted by the psychiatry provider today, noted that patient had apparently been recommended for discharge during psychiatry evaluation on 06/17/2023. Unclear if this had been communicated on that date and if so if there was a reason that plan was not acted on.  Has not been on IVC in the interim. Given prior eval recommending possible discharge home today, psychiatry did not evaluate today.  I personally reevaluated patient who does endorse ongoing SI and threatens to run into traffic to kill herself if discharged back to her group home.  I do not feel comfortable with discharge at this time without psychiatry reevaluation. Psychiatry re-evaluation pending.    Collis Deaner, MD 06/19/23 [id number]    Collis Deaner, MD 06/19/23 [phone]

## 2023-06-19 NOTE — ED Notes (Signed)
 Pt received snack and drink. Pt sitting at table in dayroom eating.

## 2023-06-19 NOTE — ED Notes (Addendum)
 RN assess skin underneath breast area and skin on her back area after patient completed shower. Skin C,D,I without redness, itching, irritation, or odor.

## 2023-06-19 NOTE — ED Notes (Signed)
 No concerning behaviors noted during shift.

## 2023-06-19 NOTE — Consult Note (Cosign Needed Addendum)
 Patient with hx of IDD and was psychiatrically cleared on 06/17/2023 but not discharged, although she was medically cleared as well.   Dr. Bunny Caroli requesting another psych consult as he reports that patient is reporting SI today on 06/19/23, preventing patient's discharge.   Reached out to ED treatment team to coordinate a virtual visit to reassess SI, although this appears to be patient's history and was communicated to the team. Awaiting opportunity to reassess.
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
## 2023-06-19 NOTE — Consult Note (Signed)
 Patient previously psychiatrically and medically cleared on 06/17/23 but did not discharge back to group home as expected. Patient re-evaluated today due to EDP request. Patient is noted lying in the bed and was awakened to participate in re-evaluation. Patient reports auditory hallucinations, stating that she hears her mother telling her to run into traffic. Patient reports SI currently, as well. She reports that symptoms are not new but SI is worse. Patient dozing off during session and falling backwards into bed, going asleep. Recommend for patient to be reassessed upon awakening, when she can fully engage for re-evaluation.

## 2023-06-19 NOTE — ED Notes (Signed)
 Patient came up to the nurses station to ask for new sheets. Patient urinated over bed sheets. New bed linen provided. Asked patient if she needed new hospital provided scrubs. Patient responded "I need help getting into bed". Patient is AOX4 and can ambulate freely. Staff instructed patient to sit on the bed and pick her feet up into the bed.

## 2023-06-19 NOTE — ED Notes (Addendum)
 Dr. Cam Cava at bedside re-assessing patient.

## 2023-06-19 NOTE — ED Notes (Signed)
VOL/Pending Disposition

## 2023-06-19 NOTE — ED Notes (Signed)
 Dinner tray and a cup of non caffeinated soda provided. Pt sitting up in bed eating.

## 2023-06-19 NOTE — ED Notes (Signed)
 Pt up to nurses station requesting a clean fitted sheet. Pt urinated in bed. New linen and scrubs provided to pt. Mattress wiped down.

## 2023-06-20 NOTE — ED Notes (Signed)
 Hospital meal provided, pt tolerated w/o complaints.  Waste discarded appropriately.

## 2023-06-20 NOTE — ED Notes (Signed)
 Vol /to be reassessed upon awakening when she can fully engage for re-evaluation

## 2023-06-20 NOTE — ED Notes (Signed)
 Vol/toc pending re evaluation

## 2023-06-20 NOTE — ED Provider Notes (Signed)
 Emergency Medicine Observation Re-evaluation Note  Sherry Bryan is a 28 y.o. female, seen on rounds today.  Pt initially presented to the ED for complaints of Suicidal Currently, the patient is resting.  Physical Exam  BP 110/79 (BP Location: Left Arm)   Pulse 88   Temp 98.3 F (36.8 C) (Oral)   Resp 18   LMP 05/01/2023 (Approximate)   SpO2 94%  Physical Exam .Gen:  No acute distress Resp:  Breathing easily and comfortably, no accessory muscle usage Neuro:  Moving all four extremities, no gross focal neuro deficits Psych:  Resting currently, calm when awake   ED Course / MDM  EKG:   I have reviewed the labs performed to date as well as medications administered while in observation.  Recent changes in the last 24 hours include no acute events.  Plan  Current plan is for psyc dispo.    Shane Darling, MD 06/20/23 [phone]

## 2023-06-20 NOTE — ED Notes (Signed)
 VOL  PENDING re-evaluation

## 2023-06-21 DIAGNOSIS — F333 Major depressive disorder, recurrent, severe with psychotic symptoms: Secondary | ICD-10-CM

## 2023-06-21 NOTE — ED Notes (Signed)
Pt given afternoon snack and water.

## 2023-06-21 NOTE — ED Notes (Signed)
Pt provided with dinner tray and beverage 

## 2023-06-21 NOTE — Consult Note (Signed)
 [facility] Follow-up  Patient Name: .Sherry Bryan  MRN: [id number]  DOB: August 16, 1995  Consult Order details:  Orders (From admission, onward)     Start     Ordered   06/16/23 1829  IP CONSULT TO PSYCHIATRY       Ordering Provider: Lubertha Rush, MD  Provider:  (Not yet assigned)  Question Answer Comment  Place call to: [phone]   Reason for Consult Admit      06/16/23 1828   06/16/23 1828  CONSULT TO CALL ACT TEAM       Ordering Provider: Lubertha Rush, MD  Provider:  (Not yet assigned)  Question:  Reason for Consult?  Answer:  Psych consult   06/16/23 1828             Mode of Visit: Tele-visit Virtual Statement:TELE PSYCHIATRY ATTESTATION & CONSENT As the provider for this telehealth consult, I attest that I verified the patient's identity using two separate identifiers, introduced myself to the patient, provided my credentials, disclosed my location, and performed this encounter via a HIPAA-compliant, real-time, face-to-face, two-way, interactive audio and video platform and with the full consent and agreement of the patient (or guardian as applicable.) Patient physical location: [facility]. Telehealth provider physical location: home office in state of [place].   Video start time: 9:25 AM Video end time: 10:25 AM    Psychiatry Consult Evaluation  Service Date: June 21, 2023 LOS:  LOS: 0 days  Chief Complaint I'm going to run into traffic  Primary Psychiatric Diagnoses  Suicidal ideations 2.  MDD recurrent severe with psychosis   Assessment  Sherry Bryan is a 28 y.o. female presented to [facility] ED on 06/16/2023 at 5:52 PM due to suicidal ideations.  Patient has a past psychiatric history of MDD severe with psychosis and reports depressed mood today.  Patient has a plan to run into traffic to end her life.  Patient also reports auditory hallucinations, command style, telling her to run into the street.   This Clinical research associate spoke with group home owner to determine if  patient's SI plan was visible, as patient has chronic SI.  Per group home owner, patient did access the road while previously residing at the group home.  Group home owner unable to assure that patient will not have access to the street to complete SI plan, even with 2 staff members present. The group home owner did mention a possible plan to make arrangements to move patient to an alternate group home that she owns that does not have access to the street, as group home owner is also concerned with patient's safety.  Group home owner reports that patient recently received medication adjustments with her outpatient psychiatric provider in an attempt to manage patient's symptoms.  Group home are also reporting that patient may require inpatient treatment due to the need for continued medication adjustments for stabilization, prior to returning to group home placement. Denies HI, paranoia or visual hallucinations. Despite patient's history of chronic SI, given this information and an inability to develop a safety plan once patient returns to the group home, patient is recommended for inpatient psychiatry at this time to ensure safety.   Diagnoses:  Active Hospital problems: Principal Problem:   Disruptive mood dysregulation disorder (HCC) Active Problems:   Intellectual disability   Borderline personality disorder (HCC)    Plan   ## Psychiatric Medication Recommendations:  Continue outpatient/home psychiatric medications  ## Medical Decision Making Capacity: Patient has a guardian and has thus  been adjudicated incompetent; please involve patients guardian in medical decision making  ## Further Work-up:  -- Deferred to ED P While pt on Qtc prolonging medications, please monitor & replete K+ to 4 and Mg2+ to 2 -- most recent EKG on 05/31/2023 had QtC of 454 -- Pertinent labwork reviewed earlier this admission includes: CBC, CMP, UDS   ## Disposition:-- We recommend inpatient psychiatric  hospitalization when medically cleared. Patient is under voluntary admission status at this time; please IVC if attempts to leave hospital.  ## Behavioral / Environmental: - No specific recommendations at this time.     ## Safety and Observation Level:  - Based on my clinical evaluation, I estimate the patient to be at low risk of self harm in the current setting. - At this time, we recommend routine. This decision is based on my review of the chart including patient's history and current presentation, interview of the patient, mental status examination, and consideration of suicide risk including evaluating suicidal ideation, plan, intent, suicidal or self-harm behaviors, risk factors, and protective factors. This judgment is based on our ability to directly address suicide risk, implement suicide prevention strategies, and develop a safety plan while the patient is in the clinical setting. Please contact our team if there is a concern that risk level has changed.  CSSR Risk Category:C-SSRS RISK CATEGORY: No Risk  Suicide Risk Assessment: Patient has following modifiable risk factors for suicide: active suicidal ideation, which we are addressing by inpatient psychiatry. Patient has following non-modifiable or demographic risk factors for suicide: psychiatric hospitalization Patient has the following protective factors against suicide: Access to outpatient mental health care  Thank you for this consult request. Recommendations have been communicated to the primary team.  We will recommend inpatient psychiatry at this time.   Monroe Antigua, NP       History of Present Illness  Relevant Aspects of Hospital ED Course:  Admitted on 06/16/2023 for suicidal ideations.    Patient Report:  I am going to run into traffic  Psych ROS:  Depression: Yes Anxiety: History of anxiety Mania (lifetime and current): Denies Psychosis: (lifetime and current): Auditory hallucinations  Collateral  information:  Contacted group home owner at [phone] on 06/21/2023  Review of Systems  Psychiatric/Behavioral:  Positive for depression and suicidal ideas.   All other systems reviewed and are negative. Patient has a medical history of allergic rhinitis, chronic constipation, GERD, seizure disorder history, seasonal allergies  Psychiatric and Social History  Psychiatric History:  Information collected from patient, group home owner and ED treatment team  Prev Dx/Sx: Aggressive behavior of adult, bipolar depression, conduct disorder adolescent, major depressive disorder severe with psychosis, borderline personality disorder, DMDD, intellectual disability Current Psych Provider: [facility] Meds (current): See below Previous Med Trials: Unknown Therapy: Yes.  [facility]  Prior Psych Hospitalization: Yes Prior Self Harm: Prior suicide attempt Prior Violence: Yes.  Past aggression per her record  Family Psych History: Unknown Family Hx suicide: Denies  Social History:  Developmental Hx: IDD Occupational Hx: Unemployed Legal Hx: Denies Living Situation: Group home living Spiritual Hx: yes Access to weapons/lethal means: Denies  Substance History Alcohol: Denies Tobacco: Denies Illicit drugs: Denies Prescription drug abuse: Denies Rehab hx: Denies  Exam Findings  Physical Exam: No abnormalities observed Vital Signs:  Temp:  [97.6 F (36.4 C)-98.5 F (36.9 C)] 98.4 F (36.9 C) (06/11 0938) Pulse Rate:  [93-96] 93 (06/11 0938) Resp:  [18-20] 19 (  06/11 0938) BP: (94-110)/(61-91) 110/74 (06/11 0938) SpO2:  [96 %-98 %] 97 % (06/11 0938) Blood pressure 110/74, pulse 93, temperature 98.4 F (36.9 C), temperature source Oral, resp. rate 19, last menstrual period 05/01/2023, SpO2 97%. There is no height or weight on file to calculate BMI.  Physical Exam Vitals and nursing note reviewed.  Neurological:     Mental Status:  She is oriented to person, place, and time. Mental status is at baseline.     Mental Status Exam: General Appearance: Disheveled  Orientation:  Full (Time, Place, and Person)  Memory: Good  Concentration: Fair  Recall: Good  Attention fair  Eye Contact: Fair  Speech: Clear and coherent  Language: Good  Volume: Normal  Mood: Depressed  Affect: Congruent  Thought Process:  Goal Directed  Thought Content:  Illogical  Suicidal Thoughts:  Yes.  with intent/plan  Homicidal Thoughts:  No  Judgement:  Poor  Insight:  Lacking  Psychomotor Activity:  Normal  Akathisia:  No  Fund of Knowledge:  Fair      Assets:  Desire for Improvement  Cognition:  WNL  ADL's:  Intact  AIMS (if indicated):        Other History   These have been pulled in through the EMR, reviewed, and updated if appropriate.  Family History:  The patient's family history is not on file.  Medical History: Past Medical History:  Diagnosis Date   Borderline personality disorder (HCC)    Constipation 05/15/2015   Depression    DMDD (disruptive mood dysregulation disorder) (HCC) 05/15/2015   History of seasonal allergies 05/15/2015   Hx of gastroesophageal reflux (GERD) 05/15/2015   Hx of seizure disorder 05/15/2015   Intellectual disability 05/22/2015   PTSD (post-traumatic stress disorder)    Seizures (HCC)    last one in 2015   Suicidal ideation     Surgical History: History reviewed. No pertinent surgical history.   Medications:   Current Facility-Administered Medications:    albuterol  (VENTOLIN  HFA) 108 (90 Base) MCG/ACT inhaler 2 puff, 2 puff, Inhalation, Q6H PRN, Lubertha Rush, MD   apixaban  (ELIQUIS ) tablet 5 mg, 5 mg, Oral, BID, Lubertha Rush, MD, 5 mg at 06/21/23 1031   cloZAPine  (CLOZARIL ) tablet 25 mg, 25 mg, Oral, BID, Funke, Mary E, MD, 25 mg at 06/21/23 1033   divalproex  (DEPAKOTE  ER) 24 hr tablet 1,000 mg, 1,000 mg, Oral, BID, Lubertha Rush, MD, 1,000 mg at 06/21/23 1028   docusate sodium   (COLACE) capsule 100 mg, 100 mg, Oral, BID, Lubertha Rush, MD, 100 mg at 06/21/23 1031   escitalopram  (LEXAPRO ) tablet 10 mg, 10 mg, Oral, Daily, Lubertha Rush, MD, 10 mg at 06/21/23 1031   FLUoxetine  (PROZAC ) capsule 40 mg, 40 mg, Oral, Daily, Lubertha Rush, MD, 40 mg at 06/21/23 1030   fluticasone  (FLONASE ) 50 MCG/ACT nasal spray 1 spray, 1 spray, Each Nare, BID PRN, Lubertha Rush, MD   folic acid  (FOLVITE ) tablet 1 mg, 1 mg, Oral, Daily, Lubertha Rush, MD, 1 mg at 06/21/23 1030   hydrochlorothiazide  (HYDRODIURIL ) tablet 25 mg, 25 mg, Oral, Daily, Lubertha Rush, MD, 25 mg at 06/21/23 1029   levETIRAcetam  (KEPPRA ) tablet 500 mg, 500 mg, Oral, BID, Lubertha Rush, MD, 500 mg at 06/21/23 1033   levothyroxine  (SYNTHROID ) tablet 50 mcg, 50 mcg, Oral, QAC breakfast, Lubertha Rush, MD, 50 mcg at 06/21/23 1037   loratadine  (CLARITIN ) tablet 10 mg, 10 mg, Oral, Daily, Lubertha Rush,  MD, 10 mg at 06/21/23 1029   LORazepam  (ATIVAN ) tablet 1 mg, 1 mg, Oral, TID, Lubertha Rush, MD, 1 mg at 06/21/23 1027   meclizine  (ANTIVERT ) tablet 25 mg, 25 mg, Oral, TID PRN, Lubertha Rush, MD   metoprolol  succinate (TOPROL -XL) 24 hr tablet 50 mg, 50 mg, Oral, Daily, Lubertha Rush, MD, 50 mg at 06/21/23 1027   OLANZapine  (ZYPREXA ) injection 10 mg, 10 mg, Intramuscular, Once PRN, Hunt, Katlin E, NP   pantoprazole  (PROTONIX ) EC tablet 40 mg, 40 mg, Oral, Daily, Lubertha Rush, MD, 40 mg at 06/21/23 1029   prazosin  (MINIPRESS ) capsule 2 mg, 2 mg, Oral, QHS, Lubertha Rush, MD, 2 mg at 06/20/23 2120   risperiDONE  (RISPERDAL ) tablet 1 mg, 1 mg, Oral, BID, Funke, Mary E, MD, 1 mg at 06/21/23 1030   traZODone  (DESYREL ) tablet 300 mg, 300 mg, Oral, QHS, Lubertha Rush, MD, 300 mg at 06/20/23 2120  Current Outpatient Medications:    apixaban  (ELIQUIS ) 5 MG TABS tablet, Take 5 mg by mouth 2 (two) times daily., Disp: , Rfl:    bacitracin  500 UNIT/GM ointment, Apply 1 Application topically 2 (two) times daily., Disp: 15 g, Rfl: 0    cloZAPine  (CLOZARIL ) 25 MG tablet, Take 25 mg by mouth 2 (two) times daily., Disp: , Rfl:    divalproex  (DEPAKOTE  ER) 500 MG 24 hr tablet, Take 1,000 mg by mouth 2 (two) times daily., Disp: , Rfl:    docusate sodium  (COLACE) 100 MG capsule, Take 100 mg by mouth 2 (two) times daily. , Disp: , Rfl:    escitalopram  (LEXAPRO ) 10 MG tablet, Take 10 mg by mouth daily., Disp: , Rfl:    FLUoxetine  (PROZAC ) 40 MG capsule, Take 40 mg by mouth daily., Disp: , Rfl:    fluticasone  (FLONASE ) 50 MCG/ACT nasal spray, Place 1 spray into the nose 2 (two) times daily as needed for allergies., Disp: , Rfl:    folic acid  (FOLVITE ) 1 MG tablet, Take 1 mg by mouth daily., Disp: , Rfl:    hydrochlorothiazide  (HYDRODIURIL ) 25 MG tablet, Take 25 mg by mouth daily., Disp: , Rfl:    levETIRAcetam  (KEPPRA ) 500 MG tablet, Take 500 mg by mouth 2 (two) times daily., Disp: , Rfl:    levothyroxine  (SYNTHROID ) 50 MCG tablet, Take 50 mcg by mouth daily before breakfast., Disp: , Rfl:    loperamide  (IMODIUM  A-D) 2 MG tablet, Take 1 tablet (2 mg total) by mouth 4 (four) times daily as needed for diarrhea or loose stools., Disp: 30 tablet, Rfl: 0   loratadine  (CLARITIN ) 10 MG tablet, Take 10 mg by mouth daily., Disp: , Rfl:    LORazepam  (ATIVAN ) 1 MG tablet, Take 1 mg by mouth 3 (three) times daily., Disp: , Rfl:    meclizine  (ANTIVERT ) 25 MG tablet, Take 25 mg by mouth 3 (three) times daily as needed for nausea., Disp: , Rfl:    metoprolol  succinate (TOPROL -XL) 50 MG 24 hr tablet, Take 50 mg by mouth daily., Disp: , Rfl:    Multiple Vitamins-Minerals (MULTIVITAMIN ADULTS) TABS, Take 1 tablet by mouth daily., Disp: , Rfl:    nystatin  (MYCOSTATIN /NYSTOP ) powder, Apply 1 Bottle topically at bedtime as needed (RASH)., Disp: , Rfl:    omeprazole (PRILOSEC) 40 MG capsule, Take 40 mg by mouth daily., Disp: , Rfl:    prazosin  (MINIPRESS ) 2 MG capsule, Take 2 mg by mouth at bedtime., Disp: , Rfl:    risperiDONE  (RISPERDAL ) 1 MG tablet, Take 1  mg by mouth 2 (two) times daily., Disp: , Rfl:    trazodone  (DESYREL ) 300 MG tablet, Take 300 mg by mouth at bedtime., Disp: , Rfl:    VENTOLIN  HFA 108 (90 Base) MCG/ACT inhaler, Inhale 2 puffs into the lungs every 6 (six) hours as needed for wheezing or shortness of breath., Disp: , Rfl:   Allergies: Allergies  Allergen Reactions   Ritalin [Methylphenidate Hcl] Other (See Comments)    seizures   Abilify [Aripiprazole] Other (See Comments)    Shaking or tremors   Lithium      Monroe Antigua, NP

## 2023-06-21 NOTE — ED Notes (Signed)
 Per [place], NP, [facility] spoke to Kimberly [phone]) @ group home to confirm pt can return once discharged.  [facility] stated patient is welcome to  return to group home.  They will be awaiting call to pick her up once discharged.  [place], [facility] [phone]

## 2023-06-21 NOTE — ED Notes (Signed)
 Hospital meal provided, pt tolerated w/o complaints.  Waste discarded appropriately.

## 2023-06-21 NOTE — BH Assessment (Addendum)
 Disposition: C. Penn, NP, patient meets inpatient criteria.  [facility] contacted and requested fax out.  Disposition discussed with Murrell Arrant RN.  Clinician spoke to Pt's Staff Garnitta, ([facility]), about Pt's disposition.  Pt's Staff Garnitta , ([facility]) was in agreeable to the inpatient treatment. Also, reports she can return to [facility] after treatment.

## 2023-06-21 NOTE — ED Provider Notes (Signed)
 Emergency Medicine Observation Re-evaluation Note  Tashari Schoenfelder is a 28 y.o. female, seen on rounds today.  Pt initially presented to the ED for complaints of Suicidal  Currently, the patient is is no acute distress. Denies any concerns at this time.  Physical Exam  Blood pressure 94/61, pulse 94, temperature 98.5 F (36.9 C), temperature source Oral, resp. rate 18, last menstrual period 05/01/2023, SpO2 98%.  Physical Exam: General: No apparent distress Pulm: Normal WOB Neuro: Moving all extremities Psych: Resting comfortably     ED Course / MDM     I have reviewed the labs performed to date as well as medications administered while in observation.  Recent changes in the last 24 hours include: No acute events overnight.  Plan   Current plan: Patient awaiting psychiatric disposition.    Viviano Ground, MD 06/21/23 [phone]

## 2023-06-21 NOTE — BH Assessment (Signed)
  Pt has been accepted to [facility] . Bed assignment:TBD    Pt meets inpatient criteria per C. [place], NP   Attending Physician will be Dr. Emilie Harden   Report can be called to: - Adult unit: [phone]   Pt can arrive anytime after 10 AM   Care Team Notified: Dr. Dodson Freestone and K. Pauletta Boroughs, RN

## 2023-06-21 NOTE — BH Assessment (Signed)
 Per [facility]), patient to be referred out of system.  Referral information for Psychiatric Hospitalization faxed to;   Dawna Etienne [phone]),   [facility] [phone] -[phone]) [phone]  Nolon Baxter [phone]),  [place] [phone] or [phone])  Donetta Furl [phone], [phone], [phone] or [phone]),   [facility] [phone])  [address] [phone]),    [facility] [phone] -or- [phone]),   [facility] Ashing [phone]),  [place] [phone])  [place] [phone] -or- [phone])  [facility] [phone]

## 2023-06-21 NOTE — ED Notes (Signed)
VOL TOC placement 

## 2023-06-22 NOTE — ED Notes (Signed)
 Report given to [facility] @ [phone]

## 2023-06-22 NOTE — ED Notes (Signed)
 Spoke to [facility] [employer] @ [phone], received permission to transport to [facility] for inpatient Tx

## 2023-06-22 NOTE — ED Notes (Signed)
 Called [employer] spoke to [place] for pickup to arrive after 10 to [employer]

## 2023-06-22 NOTE — ED Notes (Signed)
 EMTALA reviewed by this RN, pt ready for transfer. Pt's legal guardian gave consent to transfer to primary RN, primary RN charted this verbal consent on 6/11 at 1410.

## 2023-06-22 NOTE — ED Notes (Signed)
vol/toc placement.. 

## 2023-06-22 NOTE — ED Provider Notes (Signed)
 Emergency Medicine Observation Re-evaluation Note  Sherry Bryan is a 28 y.o. female, seen on rounds today.  Pt initially presented to the ED for complaints of Suicidal Currently, the patient is resting comfortably.  Physical Exam  BP (!) 116/43 (BP Location: Right Arm)   Pulse (!) 103   Temp 98.1 F (36.7 C) (Oral)   Resp 18   SpO2 94%  Physical Exam General: No acute distress  ED Course / MDM  EKG:   I have reviewed the labs performed to date as well as medications administered while in observation.  Recent changes in the last 24 hours include no acute events.  Plan  Current plan is for admission to [facility].  Plan for transport later today.    Kandee Orion, MD 06/22/23 [phone]

## 2023-07-02 ENCOUNTER — Emergency Department
Admission: EM | Admit: 2023-07-02 | Discharge: 2023-07-03 | Disposition: A | Discharge status: Home / Self Care | Payer: MEDICAID | Attending: Emergency Medicine | Admitting: Emergency Medicine

## 2023-07-02 DIAGNOSIS — T679XXA Effect of heat and light, unspecified, initial encounter: Secondary | ICD-10-CM | POA: Diagnosis present

## 2023-07-02 LAB — BASIC METABOLIC PANEL WITH GFR
Anion gap: 7 (ref 5–15)
BUN: 15 mg/dL (ref 6–20)
CO2: 26 mmol/L (ref 22–32)
Calcium: 8.3 mg/dL — ABNORMAL LOW (ref 8.9–10.3)
Chloride: 102 mmol/L (ref 98–111)
Creatinine, Ser: 0.95 mg/dL (ref 0.44–1.00)
GFR, Estimated: >60 mL/min (ref >60)
Glucose, Bld: 103 mg/dL — ABNORMAL HIGH (ref 70–99)
Potassium: 3.9 mmol/L (ref 3.5–5.1)
Sodium: 135 mmol/L (ref 135–145)

## 2023-07-02 LAB — CBC WITH DIFFERENTIAL/PLATELET
Abs Immature Granulocytes: 0.06 10*3/uL (ref 0.00–0.07)
Basophils Absolute: 0 10*3/uL (ref 0.0–0.1)
Basophils Relative: 0 %
Eosinophils Absolute: 0 10*3/uL (ref 0.0–0.5)
Eosinophils Relative: 0 %
HCT: 31.8 % — ABNORMAL LOW (ref 36.0–46.0)
Hemoglobin: 9.5 g/dL — ABNORMAL LOW (ref 12.0–15.0)
Immature Granulocytes: 1 %
Lymphocytes Relative: 20 %
Lymphs Abs: 2 10*3/uL (ref 0.7–4.0)
MCH: 23.1 pg — ABNORMAL LOW (ref 26.0–34.0)
MCHC: 29.9 g/dL — ABNORMAL LOW (ref 30.0–36.0)
MCV: 77.2 fL — ABNORMAL LOW (ref 80.0–100.0)
Monocytes Absolute: 1 10*3/uL (ref 0.1–1.0)
Monocytes Relative: 11 %
Neutro Abs: 6.7 10*3/uL (ref 1.7–7.7)
Neutrophils Relative %: 68 %
Platelets: 221 10*3/uL (ref 150–400)
RBC: 4.12 MIL/uL (ref 3.87–5.11)
RDW: 18.2 % — ABNORMAL HIGH (ref 11.5–15.5)
WBC: 9.7 10*3/uL (ref 4.0–10.5)
nRBC: 0.3 % — ABNORMAL HIGH (ref 0.0–0.2)

## 2023-07-02 MED ORDER — ONDANSETRON HCL 4 MG/2ML IJ SOLN
4.0000 mg | Freq: Once | INTRAMUSCULAR | Status: AC
  Administered 2023-07-02: 4 mg via INTRAVENOUS
  Filled 2023-07-02: qty 2

## 2023-07-02 MED ORDER — SODIUM CHLORIDE 0.9 % IV BOLUS
1000.0000 mL | Freq: Once | INTRAVENOUS | Status: AC
  Administered 2023-07-02: 1000 mL via INTRAVENOUS

## 2023-07-02 NOTE — Discharge Instructions (Signed)
 Please make sure you are staying indoors to prevent any severe heat exhaustion.  Make sure you are drinking significant amount of fluids as well.

## 2023-07-02 NOTE — ED Notes (Signed)
 Present RN left a voice note at [employer]Rollins(Legal Guardian) voice mail,  the message has the purpose to inform legal guardian disposition to discharge pt and return to same group home where patient came from.

## 2023-07-02 NOTE — ED Provider Notes (Signed)
 [facility] Provider Note    Event Date/Time   First MD Initiated Contact with Patient 07/02/23 2128     (approximate)   History   Heat Exposure   HPI Jeannett Dekoning is a 28 y.o. female presenting today for heat exposure.  Patient resides in a group home and was brought in by EMS after they reported that she had been outside for approximately 1 to 1.5 hours.  She states feeling slightly dizzy and nauseous.  Otherwise denies headache, chest pain, vision changes, shortness of breath.  Chart review: Patient seen frequently in the emergency department for dizziness symptoms.     Physical Exam   Triage Vital Signs: ED Triage Vitals  Encounter Vitals Group     BP 07/02/23 2126 117/67     Girls Systolic BP Percentile --      Girls Diastolic BP Percentile --      Boys Systolic BP Percentile --      Boys Diastolic BP Percentile --      Pulse Rate 07/02/23 2126 99     Resp 07/02/23 2126 16     Temp 07/02/23 2126 98.7 F (37.1 C)     Temp Source 07/02/23 2126 Oral     SpO2 07/02/23 2126 90 %     Weight --      Height --      Head Circumference --      Peak Flow --      Pain Score 07/02/23 2134 8     Pain Loc --      Pain Education --      Exclude from Growth Chart --     Most recent vital signs: Vitals:   07/02/23 2126 07/02/23 2134  BP: 117/67   Pulse: 99   Resp: 16   Temp: 98.7 F (37.1 C)   SpO2: 90% 91%   Physical Exam: I have reviewed the vital signs and nursing notes. General: Awake, alert, no acute distress.  Nontoxic appearing. Head:  Atraumatic, normocephalic.   ENT:  EOM intact, PERRL. Oral mucosa is pink and moist with no lesions. Neck: Neck is supple with full range of motion, No meningeal signs. Cardiovascular:  RRR, No murmurs. Peripheral pulses palpable and equal bilaterally. Respiratory:  Symmetrical chest wall expansion.  No rhonchi, rales, or wheezes.  Good air movement throughout.  No use of accessory muscles.    Musculoskeletal:  No cyanosis or edema. Moving extremities with full ROM Abdomen:  Soft, nontender, nondistended. Neuro:  GCS 15, moving all four extremities, interacting appropriately. Speech clear. Psych:  Calm, appropriate.   Skin:  Warm, dry, no rash.    ED Results / Procedures / Treatments   Labs (all labs ordered are listed, but only abnormal results are displayed) Labs Reviewed  BASIC METABOLIC PANEL WITH GFR - Abnormal; Notable for the following components:      Result Value   Glucose, Bld 103 (*)    Calcium 8.3 (*)    All other components within normal limits  CBC WITH DIFFERENTIAL/PLATELET - Abnormal; Notable for the following components:   Hemoglobin 9.5 (*)    HCT 31.8 (*)    MCV 77.2 (*)    MCH 23.1 (*)    MCHC 29.9 (*)    RDW 18.2 (*)    nRBC 0.3 (*)    All other components within normal limits     EKG    RADIOLOGY    PROCEDURES:  Critical Care performed: No  Procedures  MEDICATIONS ORDERED IN ED: Medications  ondansetron  (ZOFRAN ) injection 4 mg (4 mg Intravenous Given 07/02/23 2234)  sodium chloride  0.9 % bolus 1,000 mL (1,000 mLs Intravenous New Bag/Given 07/02/23 2233)     IMPRESSION / MDM / ASSESSMENT AND PLAN / ED COURSE  I reviewed the triage vital signs and the nursing notes.                              Differential diagnosis includes, but is not limited to, heat exhaustion, dehydration, electrolyte abnormality  Patient's presentation is most consistent with acute complicated illness / injury requiring diagnostic workup.  Patient is a 28 year old female presenting today after heat exposure with dizziness and nausea.  On arrival, her physical exam is unremarkable with no acute findings.  Vital signs are stable with no significant tachycardia.  She is afebrile.  Will give her 1 L of fluid and Zofran  while checking blood work.  Laboratory workup reassuring and not too dissimilar from her baseline with anemia.  Patient otherwise safe for  discharge back to her group home with no ongoing concerns after receiving fluids.  The patient is on the cardiac monitor to evaluate for evidence of arrhythmia and/or significant heart rate changes. Clinical Course as of 07/02/23 2303  Sun Jul 02, 2023  2228 CBC with Differential(!) Anemia slightly lower than previous baseline but no active bleeding symptoms anywhere.  Suspect this is acute on chronic but no indication for further intervention. [DW]    Clinical Course User Index [DW] Malvina Alm DASEN, MD     FINAL CLINICAL IMPRESSION(S) / ED DIAGNOSES   Final diagnoses:  Heat exposure, initial encounter     Rx / DC Orders   ED Discharge Orders     None        Note:  This document was prepared using Dragon voice recognition software and may include unintentional dictation errors.   Malvina Alm DASEN, MD 07/02/23 [phone]

## 2023-07-02 NOTE — ED Triage Notes (Signed)
 Pt presents via EMS c/o heat exposure. Pt resides in a group home per EMS, reports staff member let them know she was outside apx 1.5hrs.   EMS reports pt also walked away from group home prior to calling 911.

## 2023-07-03 ENCOUNTER — Other Ambulatory Visit: Payer: Self-pay

## 2023-07-03 NOTE — ED Notes (Signed)
 PT wake up and ambulated to bathroom without assistance. Pt bed and pants were wet; present RN tried to convince patient to change pants, however pt refused. Linen and blankets of the bed were changed.

## 2023-07-03 NOTE — ED Notes (Signed)
 RN attempted to contact legal guardian Lorrin Rollings x1 and [employer]. Both contacts listed as legal guardian in pt chart. RN also attempted to contact [name] at group home at this time. Attempts unsuccessful.

## 2023-07-03 NOTE — ED Notes (Signed)
 Multiple attempts made to contact Sherry Bryan at group home for transportation back to group home. Spoke to Garnitta at group home and stated Sharene is contact for transportation. Group home also stated they are unable to get in touch with Sherry Bryan for transportation back to group home.

## 2023-07-03 NOTE — ED Notes (Signed)
 Pt d/c to [facility] with Amy from the group home.

## 2023-07-03 NOTE — ED Notes (Signed)
 Pt wanted nurse to help her sit up he did pt was wet we sent her to bathroom cleaned bed and took pt dry clothes to put on and wipes but pt said she was fine and didn't want to be cleaned told nurse he offer to help her as well she refused and she she was fine and didn't want to be changed

## 2023-07-05 ENCOUNTER — Emergency Department
Admission: EM | Admit: 2023-07-05 | Discharge: 2023-07-06 | Disposition: A | Discharge status: Home / Self Care | Payer: MEDICAID | Attending: Emergency Medicine | Admitting: Emergency Medicine

## 2023-07-05 ENCOUNTER — Other Ambulatory Visit: Payer: Self-pay

## 2023-07-05 DIAGNOSIS — R45851 Suicidal ideations: Secondary | ICD-10-CM | POA: Insufficient documentation

## 2023-07-05 DIAGNOSIS — F329 Major depressive disorder, single episode, unspecified: Secondary | ICD-10-CM | POA: Diagnosis present

## 2023-07-05 DIAGNOSIS — F603 Borderline personality disorder: Secondary | ICD-10-CM | POA: Diagnosis not present

## 2023-07-05 DIAGNOSIS — K625 Hemorrhage of anus and rectum: Secondary | ICD-10-CM | POA: Diagnosis not present

## 2023-07-05 LAB — BASIC METABOLIC PANEL WITH GFR
Anion gap: 9 (ref 5–15)
BUN: 12 mg/dL (ref 6–20)
CO2: 25 mmol/L (ref 22–32)
Calcium: 8.7 mg/dL — ABNORMAL LOW (ref 8.9–10.3)
Chloride: 104 mmol/L (ref 98–111)
Creatinine, Ser: 1.01 mg/dL — ABNORMAL HIGH (ref 0.44–1.00)
GFR, Estimated: >60 mL/min (ref >60)
Glucose, Bld: 144 mg/dL — ABNORMAL HIGH (ref 70–99)
Potassium: 4.1 mmol/L (ref 3.5–5.1)
Sodium: 138 mmol/L (ref 135–145)

## 2023-07-05 LAB — SALICYLATE LEVEL: Salicylate Lvl: <7 mg/dL — ABNORMAL LOW (ref 7.0–30.0)

## 2023-07-05 LAB — CBC WITH DIFFERENTIAL/PLATELET
Abs Immature Granulocytes: 0.06 10*3/uL (ref 0.00–0.07)
Basophils Absolute: 0 10*3/uL (ref 0.0–0.1)
Basophils Relative: 0 %
Eosinophils Absolute: 0.1 10*3/uL (ref 0.0–0.5)
Eosinophils Relative: 1 %
HCT: 36.4 % (ref 36.0–46.0)
Hemoglobin: 10.6 g/dL — ABNORMAL LOW (ref 12.0–15.0)
Immature Granulocytes: 1 %
Lymphocytes Relative: 16 %
Lymphs Abs: 2 10*3/uL (ref 0.7–4.0)
MCH: 22.4 pg — ABNORMAL LOW (ref 26.0–34.0)
MCHC: 29.1 g/dL — ABNORMAL LOW (ref 30.0–36.0)
MCV: 76.8 fL — ABNORMAL LOW (ref 80.0–100.0)
Monocytes Absolute: 1.1 10*3/uL — ABNORMAL HIGH (ref 0.1–1.0)
Monocytes Relative: 9 %
Neutro Abs: 9.2 10*3/uL — ABNORMAL HIGH (ref 1.7–7.7)
Neutrophils Relative %: 73 %
Platelets: 269 10*3/uL (ref 150–400)
RBC: 4.74 MIL/uL (ref 3.87–5.11)
RDW: 18.1 % — ABNORMAL HIGH (ref 11.5–15.5)
WBC: 12.4 10*3/uL — ABNORMAL HIGH (ref 4.0–10.5)
nRBC: 0.3 % — ABNORMAL HIGH (ref 0.0–0.2)

## 2023-07-05 LAB — ETHANOL: Alcohol, Ethyl (B): <15 mg/dL (ref <15)

## 2023-07-05 LAB — ACETAMINOPHEN LEVEL: Acetaminophen (Tylenol), Serum: <10 ug/mL — ABNORMAL LOW (ref 10–30)

## 2023-07-05 NOTE — ED Provider Notes (Signed)
 [facility] Provider Note    Event Date/Time   First MD Initiated Contact with Patient 07/05/23 2303     (approximate)   History   Suicidal   HPI  Sherry Bryan is a 28 y.o. female with a history of major depressive disorder who presents with suicidal ideation over the last several days.  The patient states that she has not actively tried to harm herself and did not develop a specific plan.  She states she has been compliant with her medications.  The patient also reports that she feels like she is dehydrated.  She states that she has been feeling somewhat weak over the last few days.  She also has had red blood in her stool.  She denies dark or tarry stools.  This has also been over the last 2 days.  She denies any prior history of this.  She has no abdominal pain.  I reviewed the past medical records.  The patient's most recent psychiatric evaluation was on 6/11 for SI, at which time she was recommended for inpatient hospitalization.   Physical Exam   Triage Vital Signs: ED Triage Vitals  Encounter Vitals Group     BP 07/05/23 2212 122/76     Girls Systolic BP Percentile --      Girls Diastolic BP Percentile --      Boys Systolic BP Percentile --      Boys Diastolic BP Percentile --      Pulse Rate 07/05/23 2212 (!) 103     Resp 07/05/23 2212 20     Temp 07/05/23 2212 (!) 97.3 F (36.3 C)     Temp Source 07/05/23 2212 Oral     SpO2 07/05/23 2212 95 %     Weight 07/05/23 2213 270 lb (122.5 kg)     Height 07/05/23 2213 5' 4 (1.626 m)     Head Circumference --      Peak Flow --      Pain Score 07/05/23 2213 0     Pain Loc --      Pain Education --      Exclude from Growth Chart --     Most recent vital signs: Vitals:   07/05/23 2212  BP: 122/76  Pulse: (!) 103  Resp: 20  Temp: (!) 97.3 F (36.3 C)  SpO2: 95%     General: Alert, comfortable appearing, no distress.  CV:  Good peripheral perfusion.  Resp:  Normal effort.   Abd:  No distention.  Other:  Flat affect.  Calm and cooperative.  Moist mucous membranes.   ED Results / Procedures / Treatments   Labs (all labs ordered are listed, but only abnormal results are displayed) Labs Reviewed  CBC WITH DIFFERENTIAL/PLATELET - Abnormal; Notable for the following components:      Result Value   WBC 12.4 (*)    Hemoglobin 10.6 (*)    MCV 76.8 (*)    MCH 22.4 (*)    MCHC 29.1 (*)    RDW 18.1 (*)    nRBC 0.3 (*)    Neutro Abs 9.2 (*)    Monocytes Absolute 1.1 (*)    All other components within normal limits  BASIC METABOLIC PANEL WITH GFR - Abnormal; Notable for the following components:   Glucose, Bld 144 (*)    Creatinine, Ser 1.01 (*)    Calcium 8.7 (*)    All other components within normal limits  ETHANOL  ACETAMINOPHEN  LEVEL  SALICYLATE LEVEL  URINE DRUG SCREEN, QUALITATIVE ([facility] ONLY)  URINALYSIS, ROUTINE W REFLEX MICROSCOPIC     EKG   RADIOLOGY   PROCEDURES:  Critical Care performed: No  Procedures   MEDICATIONS ORDERED IN ED: Medications - No data to display   IMPRESSION / MDM / ASSESSMENT AND PLAN / ED COURSE  I reviewed the triage vital signs and the nursing notes.  28 year old female with PMH as noted above presents with SI; she also reports blood in her stool as well as some generalized weakness over the last few days and is concerned she is dehydrated.  Differential diagnosis includes, but is not limited to:  Suicidal ideation: Major depressive disorder, adjustment disorder, substance-induced mood disorder.  Will obtain psychiatry and TTS consults.  Blood in stool: External or internal hemorrhoid, diverticular bleeding.  There is no evidence of upper GI etiology.  I will perform a rectal exam.  Weakness: Anemia, electrolyte abnormality, dehydration.  The patient clinically does not appear dehydrated.  Hemoglobin is stable.  Patient's presentation is most consistent with exacerbation of chronic illness.  The  patient has been placed in psychiatric observation due to the need to provide a safe environment for the patient while obtaining psychiatric consultation and evaluation, as well as ongoing medical and medication management to treat the patient's condition.  The patient has not been placed under full IVC at this time.     FINAL CLINICAL IMPRESSION(S) / ED DIAGNOSES   Final diagnoses:  None     Rx / DC Orders   ED Discharge Orders     None        Note:  This document was prepared using Dragon voice recognition software and may include unintentional dictation errors.

## 2023-07-05 NOTE — ED Notes (Signed)
 Per Group Home Manager, LaToya, Pt currently has a one on one at the group home for day time hours to monitor behavior but currently does not have one at night time. Once the day time person leaves, pt's behavior declines and pt starts acting out. The group home is hiring a sitter for evening hours but this person hasn't started yet.   The group home manager, Sharene will keep her cell phone on tonight so we can reach her if needed and let her know if pt is going to be discharged.

## 2023-07-05 NOTE — BH Assessment (Signed)
 This Clinical research associate contacted [facility] via phone to request an assessment for this patient, request has been made, assessment is currently pending.

## 2023-07-05 NOTE — ED Triage Notes (Signed)
 Pt to Ed from [facility] for SI and rectal bleeding. Pt's stated she doesn't like her group home and her mother died. Both causing suicidal thoughts. Also, pt stated she has had blood in her stool for the past two days.

## 2023-07-05 NOTE — ED Notes (Signed)
 Pt dressed out into hospital scrubs  Pt's belongings: Dress Hair bow Shoes.

## 2023-07-06 DIAGNOSIS — R45851 Suicidal ideations: Secondary | ICD-10-CM

## 2023-07-06 NOTE — ED Notes (Signed)
 This RN called Group home to see when they are picking up pt.
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
## 2023-07-06 NOTE — ED Notes (Signed)
 This RN called twice to the group home for pick up. They did not pick up after saying they were on their way a hour ago.

## 2023-07-06 NOTE — Discharge Instructions (Addendum)
 Please make sure to follow-up with your primary care doctor for further management of your symptoms.  I have also put in the number for the gastroenterologist for you to follow-up for your rectal bleeding.

## 2023-07-06 NOTE — ED Notes (Signed)
 Group home here to pick up pt. Pt belongings returned to pt at bedside.

## 2023-07-06 NOTE — ED Notes (Signed)
vol/psych consult ordered/pending.. 

## 2023-07-06 NOTE — ED Notes (Signed)
 This RN spoke to [place] from Group home and gave report on pt status and d/c

## 2023-07-06 NOTE — ED Notes (Signed)
 Pt was given breakfast at bedside.

## 2023-07-06 NOTE — ED Notes (Signed)
 Pt was given lunch at the bedside.
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
## 2023-08-19 ENCOUNTER — Emergency Department
Admission: EM | Admit: 2023-08-19 | Discharge: 2023-08-22 | Disposition: A | Discharge status: Home / Self Care | Payer: MEDICAID | Attending: Emergency Medicine | Admitting: Emergency Medicine

## 2023-08-19 ENCOUNTER — Other Ambulatory Visit: Payer: Self-pay

## 2023-08-19 DIAGNOSIS — F4310 Post-traumatic stress disorder, unspecified: Secondary | ICD-10-CM | POA: Insufficient documentation

## 2023-08-19 DIAGNOSIS — F319 Bipolar disorder, unspecified: Secondary | ICD-10-CM | POA: Insufficient documentation

## 2023-08-19 DIAGNOSIS — R451 Restlessness and agitation: Secondary | ICD-10-CM | POA: Insufficient documentation

## 2023-08-19 DIAGNOSIS — F70 Mild intellectual disabilities: Secondary | ICD-10-CM | POA: Diagnosis not present

## 2023-08-19 LAB — COMPREHENSIVE METABOLIC PANEL WITH GFR
ALT: 16 U/L (ref 0–44)
AST: 16 U/L (ref 15–41)
Albumin: 3.1 g/dL — ABNORMAL LOW (ref 3.5–5.0)
Alkaline Phosphatase: 51 U/L (ref 38–126)
Anion gap: 7 (ref 5–15)
BUN: 10 mg/dL (ref 6–20)
CO2: 23 mmol/L (ref 22–32)
Calcium: 8.8 mg/dL — ABNORMAL LOW (ref 8.9–10.3)
Chloride: 105 mmol/L (ref 98–111)
Creatinine, Ser: 0.9 mg/dL (ref 0.44–1.00)
GFR, Estimated: >60 mL/min (ref >60)
Glucose, Bld: 147 mg/dL — ABNORMAL HIGH (ref 70–99)
Potassium: 4 mmol/L (ref 3.5–5.1)
Sodium: 135 mmol/L (ref 135–145)
Total Bilirubin: 0.5 mg/dL (ref 0.0–1.2)
Total Protein: 7.2 g/dL (ref 6.5–8.1)

## 2023-08-19 LAB — URINE DRUG SCREEN, QUALITATIVE (ARMC ONLY)
Amphetamines, Ur Screen: POSITIVE — AB
Barbiturates, Ur Screen: NOT DETECTED
Benzodiazepine, Ur Scrn: NOT DETECTED
Cannabinoid 50 Ng, Ur ~~LOC~~: NOT DETECTED
Cocaine Metabolite,Ur ~~LOC~~: NOT DETECTED
MDMA (Ecstasy)Ur Screen: NOT DETECTED
Methadone Scn, Ur: NOT DETECTED
Opiate, Ur Screen: NOT DETECTED
Phencyclidine (PCP) Ur S: NOT DETECTED
Tricyclic, Ur Screen: NOT DETECTED

## 2023-08-19 LAB — CBC
HCT: 41.9 % (ref 36.0–46.0)
Hemoglobin: 12.2 g/dL (ref 12.0–15.0)
MCH: 22.3 pg — ABNORMAL LOW (ref 26.0–34.0)
MCHC: 29.1 g/dL — ABNORMAL LOW (ref 30.0–36.0)
MCV: 76.5 fL — ABNORMAL LOW (ref 80.0–100.0)
Platelets: 253 K/uL (ref 150–400)
RBC: 5.48 MIL/uL — ABNORMAL HIGH (ref 3.87–5.11)
RDW: 17.8 % — ABNORMAL HIGH (ref 11.5–15.5)
WBC: 7.4 K/uL (ref 4.0–10.5)
nRBC: 0 % (ref 0.0–0.2)

## 2023-08-19 LAB — POC URINE PREG, ED: Preg Test, Ur: NEGATIVE

## 2023-08-19 LAB — ETHANOL: Alcohol, Ethyl (B): <15 mg/dL (ref <15)

## 2023-08-19 MED ORDER — PANTOPRAZOLE SODIUM 40 MG PO TBEC
40.0000 mg | DELAYED_RELEASE_TABLET | Freq: Every day | ORAL | Status: DC
  Administered 2023-08-20 – 2023-08-22 (×5): 40 mg via ORAL
  Filled 2023-08-19 (×3): qty 1

## 2023-08-19 MED ORDER — DIVALPROEX SODIUM ER 500 MG PO TB24: 1000.0000 mg | ORAL_TABLET | Freq: Two times a day (BID) | ORAL | Status: DC

## 2023-08-19 MED ORDER — FOLIC ACID 1 MG PO TABS
1.0000 mg | ORAL_TABLET | Freq: Every day | ORAL | Status: DC
  Administered 2023-08-20 – 2023-08-22 (×5): 1 mg via ORAL
  Filled 2023-08-19 (×3): qty 1

## 2023-08-19 MED ORDER — CLOZAPINE 25 MG PO TABS
25.0000 mg | ORAL_TABLET | Freq: Two times a day (BID) | ORAL | Status: DC
  Filled 2023-08-19: qty 1

## 2023-08-19 MED ORDER — LORAZEPAM 1 MG PO TABS: 1.0000 mg | ORAL_TABLET | Freq: Three times a day (TID) | ORAL | Status: DC

## 2023-08-19 MED ORDER — LORATADINE 10 MG PO TABS
10.0000 mg | ORAL_TABLET | Freq: Every day | ORAL | Status: DC
  Administered 2023-08-20 – 2023-08-22 (×5): 10 mg via ORAL
  Filled 2023-08-19 (×3): qty 1

## 2023-08-19 MED ORDER — APIXABAN 5 MG PO TABS: 5.0000 mg | ORAL_TABLET | Freq: Two times a day (BID) | ORAL | Status: DC

## 2023-08-19 MED ORDER — ESCITALOPRAM OXALATE 10 MG PO TABS
10.0000 mg | ORAL_TABLET | Freq: Every day | ORAL | Status: DC
  Administered 2023-08-20 – 2023-08-22 (×5): 10 mg via ORAL
  Filled 2023-08-19 (×3): qty 1

## 2023-08-19 MED ORDER — LEVOTHYROXINE SODIUM 25 MCG PO TABS
50.0000 ug | ORAL_TABLET | Freq: Every day | ORAL | Status: DC
  Administered 2023-08-20 – 2023-08-22 (×5): 50 ug via ORAL
  Filled 2023-08-19 (×3): qty 2

## 2023-08-19 MED ORDER — TRAZODONE HCL 100 MG PO TABS: 300.0000 mg | ORAL_TABLET | Freq: Every day | ORAL | Status: DC

## 2023-08-19 MED ORDER — DEUTETRABENAZINE ER 24 MG PO TB24: 1.0000 | ORAL_TABLET | Freq: Every day | ORAL | Status: DC

## 2023-08-19 MED ORDER — FLUOXETINE HCL 20 MG PO CAPS
40.0000 mg | ORAL_CAPSULE | Freq: Every day | ORAL | Status: DC
  Administered 2023-08-20 – 2023-08-22 (×5): 40 mg via ORAL
  Filled 2023-08-19 (×3): qty 2

## 2023-08-19 MED ORDER — LEVETIRACETAM 500 MG PO TABS
500.0000 mg | ORAL_TABLET | Freq: Two times a day (BID) | ORAL | Status: DC
  Filled 2023-08-19: qty 1

## 2023-08-19 MED ORDER — HYDROCHLOROTHIAZIDE 25 MG PO TABS
25.0000 mg | ORAL_TABLET | Freq: Every day | ORAL | Status: DC
  Administered 2023-08-20 – 2023-08-22 (×5): 25 mg via ORAL
  Filled 2023-08-19 (×3): qty 1

## 2023-08-19 MED ORDER — DOCUSATE SODIUM 100 MG PO CAPS
100.0000 mg | ORAL_CAPSULE | Freq: Two times a day (BID) | ORAL | Status: DC
  Administered 2023-08-20 – 2023-08-22 (×8): 100 mg via ORAL
  Filled 2023-08-19 (×5): qty 1

## 2023-08-19 MED ORDER — RISPERIDONE 1 MG PO TABS: 1.0000 mg | ORAL_TABLET | Freq: Two times a day (BID) | ORAL | Status: DC

## 2023-08-19 MED ORDER — ALBUTEROL SULFATE HFA 108 (90 BASE) MCG/ACT IN AERS: 2.0000 | INHALATION_SPRAY | Freq: Four times a day (QID) | RESPIRATORY_TRACT | Status: DC | PRN

## 2023-08-19 MED ORDER — PRAZOSIN HCL 1 MG PO CAPS: 2.0000 mg | ORAL_CAPSULE | Freq: Every day | ORAL | Status: DC

## 2023-08-19 MED ORDER — MECLIZINE HCL 25 MG PO TABS: 25.0000 mg | ORAL_TABLET | Freq: Three times a day (TID) | ORAL | Status: DC | PRN

## 2023-08-19 MED ORDER — METOPROLOL SUCCINATE ER 50 MG PO TB24
50.0000 mg | ORAL_TABLET | Freq: Every day | ORAL | Status: DC
  Administered 2023-08-20 – 2023-08-22 (×5): 50 mg via ORAL
  Filled 2023-08-19 (×3): qty 1

## 2023-08-19 NOTE — BH Assessment (Addendum)
 Comprehensive Clinical Assessment (CCA) Screening, Triage and Referral Note  08/19/2023 Sherry Bryan [id number]  Sherry Bryan is a 27yo female who presents to [facility] ED after attempting to runaway from her group home. Pt was transported to ED for evaluation. Pt reports having suicidal ideations with a plan to cut herself. Pt reports having a history of cutting as she reports her last self harming behavior was in 2015. Pt further reports a history of running away and acting out with aggressive behavior. Pt appears to be impulsive in her actions/behaviors; however, she has poor judgement. She also appears to have enough insight to verbalize that she has defiant behaviors. Pt reports having daily auditory hallucinations with command that tell her to hurt myself. Pt denied HI/VH. Pt was recently discharged from [facility] after a week of inpatient psychiatric treatment. Pt was vague as she talked about her medication compliance.  Chief Complaint:  Chief Complaint  Patient presents with   Psychiatric Evaluation   Visit Diagnosis: major Depressive Disorder, recurrent, severe, with psychotic features  Patient Reported Information How did you hear about us ? Legal System  What Is the Reason for Your Visit/Call Today? Pt presents to ER accompanied by [employer] Officer, who reports pt was at a business and when questioned pt requested to come to ER due to suicidal thoughts.   Pt reports she wan away from group home [facility] ([address] Reports she has SI reports plans to use glass to cut herself. Pt denies any HI. Pt denies hearing anything does reports seeing some black spots. Pt is calm in triage.  How Long Has This Been Causing You Problems? > than 6 months  What Do You Feel Would Help You the Most Today? Stress Management; Housing Assistance; Treatment for Depression or other mood problem; Medication(s)   Have You Recently Had Any Thoughts About Hurting Yourself? Yes  Are You  Planning to Commit Suicide/Harm Yourself At This time? No   Have you Recently Had Thoughts About Hurting Someone Sherral? No  Are You Planning to Harm Someone at This Time? No  Explanation: Pt endorses having thoughts of suicide with a plan to cut herself.   Have You Used Any Alcohol or Drugs in the Past 24 Hours? No  How Long Ago Did You Use Drugs or Alcohol? No data recorded What Did You Use and How Much? No data recorded  Do You Currently Have a Therapist/Psychiatrist? No  Name of Therapist/Psychiatrist: Pt gets medication managaement through her group home.   Have You Been Recently Discharged From Any Office Practice or Programs? No  Explanation of Discharge From Practice/Program: N/A    CCA Screening Triage Referral Assessment Type of Contact: Face-to-Face  Telemedicine Service Delivery:   Is this Initial or Reassessment?   Date Telepsych consult ordered in [facility]:    Time Telepsych consult ordered in [facility]:    Location of Assessment: [facility] ED  Provider Location: [facility] ED    Collateral Involvement: None provided   Does Patient Have a Court Appointed Legal Guardian? No data recorded Name and Contact of Legal Guardian: No data recorded If Minor and Not Living with Parent(s), Who has Custody? N/A  Is CPS involved or ever been involved? Never  Is APS involved or ever been involved? Never   Patient Determined To Be At Risk for Harm To Self or Others Based on Review of Patient Reported Information or Presenting Complaint? No  Method: Plan without intent  Availability of Means: No access or NA  Intent: Vague intent or NA  Notification Required: No need or identified person  Additional Information for Danger to Others Potential: Previous attempts  Additional Comments for Danger to Others Potential: n/a  Are There Guns or Other Weapons in Your Home? No  Types of Guns/Weapons: n/a  Are These Weapons Safely Secured?                            No  Who Could Verify  You Are Able To Have These Secured: n/a  Do You Have any Outstanding Charges, Pending Court Dates, Parole/Probation? None Reported  Contacted To Inform of Risk of Harm To Self or Others: -- (n/a)   Does Patient Present under Involuntary Commitment? No    [place] of Residence: [place]   Patient Currently Receiving the Following Services: Group Home; Medication Management   Determination of Need: Emergent (2 hours)   Options For Referral: Medication Management; Outpatient Therapy   Disposition Recommendation per psychiatric provider: We recommend inpatient psychiatric hospitalization when medically cleared. Patient is under voluntary admission status at this time; please IVC if attempts to leave hospital.   Magdalen Mages, KEN LEANDER, PMH-C Therapeutic Triage Specialist Phone: [phone]  MAGDALEN LITTIE Mages, Counselor

## 2023-08-19 NOTE — ED Triage Notes (Signed)
 Pt presents to ER accompanied by [employer] Officer, who reports pt was at a business and when questioned pt requested to come to ER due to suicidal thoughts.  Pt reports she wan away from group home [facility] ([address] Reports she has SI reports plans to use glass to cut herself. Pt denies any HI. Pt denies hearing anything does reports seeing some black spots. Pt is calm in triage.

## 2023-08-19 NOTE — ED Notes (Signed)
Pt denies SI/HI/AVH on assessment 

## 2023-08-19 NOTE — ED Notes (Signed)
Report given to [facility], Charity fundraiser.

## 2023-08-19 NOTE — ED Notes (Signed)
Pt was provided a dinner tray.
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
## 2023-08-19 NOTE — ED Notes (Signed)
 Pt changes to hospital scrubs. Pt belongings in plastic bag: Gray pants, white underwear, pink sandals, gray t-shirt

## 2023-08-19 NOTE — BH Assessment (Signed)
 Per [facility] ([place]), patient to be referred out of system.  Referral information for Psychiatric Hospitalization faxed to;   [facility] [phone]- [phone])  [facility] [phone] -[phone]) [phone]  [place] [phone]--- [phone]--- [phone]--- [phone])  [address] [phone]),   [place] [phone] -or- [phone]),   [place] [phone]),  [place] [phone])  [facility] [phone])
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
## 2023-08-20 MED ORDER — PRAZOSIN HCL 1 MG PO CAPS
2.0000 mg | ORAL_CAPSULE | Freq: Every day | ORAL | Status: DC
Start: 2023-08-20 — End: 2023-08-22
  Administered 2023-08-21 (×2): 2 mg via ORAL
  Filled 2023-08-20 (×2): qty 2

## 2023-08-20 MED ORDER — LORAZEPAM 1 MG PO TABS
1.0000 mg | ORAL_TABLET | Freq: Three times a day (TID) | ORAL | Status: DC
  Administered 2023-08-20 – 2023-08-22 (×11): 1 mg via ORAL
  Filled 2023-08-20 (×7): qty 1

## 2023-08-20 MED ORDER — TRAZODONE HCL 100 MG PO TABS
300.0000 mg | ORAL_TABLET | Freq: Every day | ORAL | Status: DC
Start: 2023-08-20 — End: 2023-08-22
  Administered 2023-08-20 – 2023-08-21 (×3): 300 mg via ORAL
  Filled 2023-08-20 (×2): qty 3

## 2023-08-20 MED ORDER — APIXABAN 5 MG PO TABS
5.0000 mg | ORAL_TABLET | Freq: Two times a day (BID) | ORAL | Status: DC
  Administered 2023-08-20 – 2023-08-22 (×8): 5 mg via ORAL
  Filled 2023-08-20 (×5): qty 1

## 2023-08-20 MED ORDER — RISPERIDONE 1 MG PO TABS
1.0000 mg | ORAL_TABLET | Freq: Two times a day (BID) | ORAL | Status: DC
  Administered 2023-08-20 – 2023-08-22 (×8): 1 mg via ORAL
  Filled 2023-08-20 (×5): qty 1

## 2023-08-20 MED ORDER — DIVALPROEX SODIUM ER 500 MG PO TB24
1000.0000 mg | ORAL_TABLET | Freq: Two times a day (BID) | ORAL | Status: DC
  Administered 2023-08-20 – 2023-08-22 (×8): 1000 mg via ORAL
  Filled 2023-08-20 (×5): qty 2

## 2023-08-20 MED ORDER — CLOZAPINE 25 MG PO TABS
25.0000 mg | ORAL_TABLET | Freq: Two times a day (BID) | ORAL | Status: DC
  Administered 2023-08-20 – 2023-08-22 (×8): 25 mg via ORAL
  Filled 2023-08-20 (×5): qty 1

## 2023-08-20 MED ORDER — LEVETIRACETAM 500 MG PO TABS
500.0000 mg | ORAL_TABLET | Freq: Two times a day (BID) | ORAL | Status: DC
  Administered 2023-08-20 – 2023-08-22 (×8): 500 mg via ORAL
  Filled 2023-08-20 (×5): qty 1

## 2023-08-20 NOTE — ED Provider Notes (Signed)
 Emergency Medicine Observation Re-evaluation Note  Sherry Bryan is a 28 y.o. female, seen on rounds today.  Pt initially presented to the ED for complaints of Psychiatric Evaluation  Currently, the patient is no acute distress. resting  Physical Exam  Blood pressure 117/70, pulse 83, temperature 98.4 F (36.9 C), temperature source Oral, resp. rate 15, height 5' 4 (1.626 m), weight 122.5 kg, SpO2 94%.  Physical Exam General: No apparent distress Pulm: Normal WOB Psych: resting     ED Course / MDM     I have reviewed the labs performed to date as well as medications administered while in observation.  Recent changes in the last 24 hours include none   Plan   Current plan is to continue to wait for psych placement Patient is not under full IVC at this time.   Ernest Ronal BRAVO, MD 08/20/23 [phone]

## 2023-08-20 NOTE — ED Notes (Signed)
VOL/pending placement 

## 2023-08-20 NOTE — BH Assessment (Signed)
 Referral information for Psychiatric Hospitalization RE-faxed to;     [facility]  [phone]  [facility]  [phone]  [facility]  [phone]  [facility]  [phone]  [facility]  [phone]  [facility]  [phone]  [facility]  [phone]  [facility] [place]  [phone]  [facility]  [phone]  [facility]  [phone]  [facility]  [phone]  [facility]  [phone]  [facility]  [phone]  [facility]  [phone]  [place] EFAX  [phone]

## 2023-08-20 NOTE — ED Notes (Signed)
 Pt out of bed. Pt wet. Pt to shower. Pt bed changed by pt.

## 2023-08-20 NOTE — ED Notes (Signed)
Lunch tray and drink provided to pt 

## 2023-08-20 NOTE — ED Notes (Signed)
Patient is vol pending placement 

## 2023-08-20 NOTE — ED Notes (Signed)
 Provided pt with breakfast tray and juice.

## 2023-08-20 NOTE — ED Notes (Addendum)
Sherry Bryan

## 2023-08-20 NOTE — ED Notes (Signed)
Pt refused shower at this time.

## 2023-08-20 NOTE — ED Notes (Signed)
 Hospital meal provided, pt tolerated w/o complaints.  Waste discarded appropriately.

## 2023-08-21 LAB — CBC WITH DIFFERENTIAL/PLATELET
Abs Immature Granulocytes: 0.03 K/uL (ref 0.00–0.07)
Basophils Absolute: 0 K/uL (ref 0.0–0.1)
Basophils Relative: 0 %
Eosinophils Absolute: 0 K/uL (ref 0.0–0.5)
Eosinophils Relative: 0 %
HCT: 39.8 % (ref 36.0–46.0)
Hemoglobin: 11.8 g/dL — ABNORMAL LOW (ref 12.0–15.0)
Immature Granulocytes: 0 %
Lymphocytes Relative: 30 %
Lymphs Abs: 2.6 K/uL (ref 0.7–4.0)
MCH: 22.7 pg — ABNORMAL LOW (ref 26.0–34.0)
MCHC: 29.6 g/dL — ABNORMAL LOW (ref 30.0–36.0)
MCV: 76.7 fL — ABNORMAL LOW (ref 80.0–100.0)
Monocytes Absolute: 0.7 K/uL (ref 0.1–1.0)
Monocytes Relative: 8 %
Neutro Abs: 5.4 K/uL (ref 1.7–7.7)
Neutrophils Relative %: 62 %
Platelets: 245 K/uL (ref 150–400)
RBC: 5.19 MIL/uL — ABNORMAL HIGH (ref 3.87–5.11)
RDW: 18.2 % — ABNORMAL HIGH (ref 11.5–15.5)
WBC: 8.8 K/uL (ref 4.0–10.5)
nRBC: 0 % (ref 0.0–0.2)

## 2023-08-21 NOTE — ED Provider Notes (Signed)
 Emergency Medicine Observation Re-evaluation Note  Sherry Bryan is a 28 y.o. female, seen on rounds today.  Pt initially presented to the ED for complaints of Psychiatric Evaluation  Currently, the patient is no acute distress.  Currently asleep in bed without distress  Physical Exam   Vitals:   08/20/23 1926 08/20/23 2107  BP: 99/60 99/60  Pulse: 83   Resp: 16   Temp: 98.6 F (37 C)   SpO2: 92%      Physical Exam Sleeping comfortably without distress     ED Course / MDM     I have reviewed the labs performed to date as well as medications administered while in observation.  Recent changes in the last 24 hours include none   Plan   Current plan is to continue to wait for psych placement Patient is not under full IVC at this time.     Dicky Anes, MD 08/21/23 [phone]

## 2023-08-21 NOTE — ED Notes (Signed)
Pt refused breakfast this morning.

## 2023-08-21 NOTE — ED Notes (Addendum)
 Pt was given a lunch tray and supplies for the shower.

## 2023-08-21 NOTE — ED Notes (Signed)
 Vitals are complete.

## 2023-08-21 NOTE — Progress Notes (Addendum)
 Per TTS, patient has been re-faxed to the following out of network facilities:   Service Provider Phone  [facility]  [phone]  [facility]  [phone]  [facility]  [phone]  [facility]  [phone]  [facility]  [phone]  [facility]  [phone]  [facility]  [phone]  [facility] [place]  [phone]  [facility]  [phone]  [facility]  [phone]  [facility]  [phone]  [facility]  [phone]  [facility]  [phone]  [facility]  [phone] EFAX  [phone], [facility] [phone]

## 2023-08-21 NOTE — BH Assessment (Signed)
 Patient's legal guardian, [name] needs to contact [facility] [phone], ext [phone] to retrieve paperwork in order for patient to be admitted for inpatient tx.

## 2023-08-21 NOTE — ED Notes (Signed)
VOL/Pending Placement 

## 2023-08-21 NOTE — BH Assessment (Signed)
 Patient has been accepted to [facility] on tomm 08/22/23 pending signed paperwork by legal guardian. Patient was not assigned to a room. Accepting physician is Dr. Millie Manners.  Call report to [phone].  Representative was [employer].   ER Staff is aware of it:  Olam, ER Secretary  Dr. Ernest, ER MD  Alfonso, Patient's Nurse     Writer left message for Patient's Family/Support System [facility] Carlin- legal guardian [phone]) to return phone call.

## 2023-08-21 NOTE — ED Notes (Signed)
 [facility] spoke to [name] of [employer] [phone]).  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  The business is owned by [employer] which is Latoya's mother.  Patient lives in a home with a roommate managed by [facility].  [facility] provides support services to patient in her home 24 hours a day.    Latoya reported that the support staff member caring for patient this past weekend was a new employee and was unfamiliar with patient's behaviors. Latoya reported prior to this event, patient was making great progress.  Patient's medications were working better, and an additional lock was placed on the front door which resolved patient's wandering issues.    Latoya confirmed that patient can return to her home once psych cleared.  Latoya did have additional questions regarding patient's current medications while in the ED.  [facility] gave number to patient's RN Rosaline) for all clinical questions.  [place], [facility] [phone]

## 2023-08-21 NOTE — ED Notes (Signed)
 Pt refused snack

## 2023-08-21 NOTE — ED Notes (Signed)
 VOL patient to [place] 8/12 in am.  See note.

## 2023-08-21 NOTE — ED Notes (Signed)
 Pt refusing snack at this time. Pt calm and cooperative.

## 2023-08-21 NOTE — ED Notes (Signed)
 VOL pending placement per last note

## 2023-08-21 NOTE — ED Notes (Signed)

## 2023-08-21 NOTE — BH Assessment (Signed)
 Writer received call from patient's legal guardian, [name] with [facility]. Writer informed of patient disposition and currently awaiting bed placement with regard to being on [facility] wait list. Ms. Hoover verbalized understanding and stated she can be reached at [phone].

## 2023-08-21 NOTE — ED Notes (Signed)
 Dinner was given at the bedside.

## 2023-08-22 NOTE — ED Notes (Signed)
 1:05PM  [facility] spoke to [name] of [facility] (see previous notes from 08/21/23).  Latoya stated she was highly upset and frustrated after hearing that patient was continuously receiving the incorrect medications.   Latoya stated she received a call from [facility] pharmacy on yesterday requesting a faxed copy of the updated medication list.  When asked, Sharene could not confirm who she spoke to in pharmacy on yesterday and could not confirm if the med list her staff member provided was received by pharmacy. Latoya shared that she is aware that patient would be going to [facility] today.  [facility] confirmed that patient was accepted to [facility] and consent was given by patient's legal guardian for admission.        [place], [facility] [phone]

## 2023-08-22 NOTE — ED Notes (Signed)
 This RN received a phone call from [name], the group home director. She is extremely upset, states that she has spoken with several staff members from hospital and has provided a current, correct medication list. She said she is calling patients LG and advising her to not agree to transfer to [facility] as she feels we are not medicating patient appropriately and not providing the care patient needs. Ms. Chancy states that we are still giving patient medications that she does not take and referred to this being a medication error. I have explained to Ms. Chancy that we are medicating patient per the current orders from psychiatry. Ms. Chancy became verbally aggressive and threatening over the phone. This RN informed Ms. Chancy that conversation will not continue. This RN took Ms. Murphy's number and chat message sent to involved behavioral health staff as well as ER charge RN and ER provider.

## 2023-08-22 NOTE — ED Notes (Signed)
 Patient has slept entire morning. Pt has now showered and in dayroom for lunch.

## 2023-08-22 NOTE — ED Notes (Signed)
 Assumed care of patient, pt resting in bed, respirations even and unlabored, no signs of distress noted

## 2023-08-22 NOTE — ED Notes (Signed)
 This RN called [facility] report line, left message, waiting for call back.

## 2023-08-22 NOTE — ED Notes (Signed)
 This RN gave report to Sheppard Babe at [facility] [phone]

## 2023-08-22 NOTE — BH Assessment (Signed)
 The writer spoke with the patient's legal guardian, [name] ([phone]), and provided her with contact information for [facility] to facilitate coordination of the voluntary consent exchange. Ms. Hoover agreed to contact [facility] upon arriving at her office around 8:30 AM.

## 2023-08-22 NOTE — ED Notes (Signed)
SAFE  TRANSPORT  CALLED  FOR  TRANSFER  TO [facility]

## 2023-08-22 NOTE — ED Provider Notes (Addendum)
 Emergency Medicine Observation Re-evaluation Note  Sherry Bryan is a 28 y.o. female, seen on rounds today.  Pt initially presented to the ED for complaints of Psychiatric Evaluation  Currently, the patient is is no acute distress. Denies any concerns at this time.  Physical Exam  Blood pressure 115/78, pulse 76, temperature 98 F (36.7 C), temperature source Oral, resp. rate 18, height 5' 4 (1.626 m), weight 122.5 kg, SpO2 97%.  Physical Exam: General: No apparent distress Pulm: Normal WOB Neuro: Moving all extremities Psych: Resting comfortably, sitting in a chair with no complaints     ED Course / MDM     I have reviewed the labs performed to date as well as medications administered while in observation.  Recent changes in the last 24 hours include: No acute events overnight.  Notified by nursing staff that she received a phone call from group home director.  She was upset about going to [facility].  Became verbally abusive and aggressive over the telephone.  Reached out to psychiatry and social work team who reported understanding and still recommended inpatient admission to [facility] and that they have a consent from legal guardian.  Plan to transfer to [facility].   Notified they did discuss with legal guardian and they were aware that she is being transferred to [facility]   Current plan: Patient awaiting placement -accepted to [facility].  Awaiting transfer Patient is not under full IVC at this time.    Suzanne Kirsch, MD 08/22/23 1342    Suzanne Kirsch, MD 08/22/23 1433

## 2023-08-22 NOTE — ED Notes (Signed)
 Pt discharged/transferred to [facility] via SAFE transport. Personal belongings given to driver as well as necessary transfer paperwork. Attempted to call Sheppard Babe RN at [facility] to advise of ETA but he did not answer.

## 2023-08-22 NOTE — BH Assessment (Addendum)
 Writer attempted to contact Patient's legal guardian, [name] [phone]); however, there was no answer. A HIPAA compliant voicemail was left requesting a call back.   Writer attempted to contact guardianship supervisor Rollings,Lorrin (Legal Guardian) [phone]; however, there was no answer. A HIPAA compliant voicemail was left requesting a return phone call.

## 2023-09-16 ENCOUNTER — Other Ambulatory Visit: Payer: Self-pay

## 2023-09-16 ENCOUNTER — Emergency Department
Admission: EM | Admit: 2023-09-16 | Discharge: 2023-09-19 | Disposition: A | Discharge status: Other Acute Inpatient Hospital | Payer: MEDICAID | Attending: Emergency Medicine | Admitting: Emergency Medicine

## 2023-09-16 DIAGNOSIS — R45851 Suicidal ideations: Secondary | ICD-10-CM | POA: Diagnosis not present

## 2023-09-16 DIAGNOSIS — F32A Depression, unspecified: Secondary | ICD-10-CM | POA: Diagnosis not present

## 2023-09-16 DIAGNOSIS — R456 Violent behavior: Secondary | ICD-10-CM | POA: Diagnosis present

## 2023-09-16 DIAGNOSIS — F259 Schizoaffective disorder, unspecified: Secondary | ICD-10-CM | POA: Diagnosis not present

## 2023-09-16 DIAGNOSIS — F79 Unspecified intellectual disabilities: Secondary | ICD-10-CM | POA: Insufficient documentation

## 2023-09-16 DIAGNOSIS — F29 Unspecified psychosis not due to a substance or known physiological condition: Secondary | ICD-10-CM | POA: Insufficient documentation

## 2023-09-16 LAB — CBC
HCT: 36.4 % (ref 36.0–46.0)
Hemoglobin: 11.1 g/dL — ABNORMAL LOW (ref 12.0–15.0)
MCH: 22.5 pg — ABNORMAL LOW (ref 26.0–34.0)
MCHC: 30.5 g/dL (ref 30.0–36.0)
MCV: 73.7 fL — ABNORMAL LOW (ref 80.0–100.0)
Platelets: 193 K/uL (ref 150–400)
RBC: 4.94 MIL/uL (ref 3.87–5.11)
RDW: 18 % — ABNORMAL HIGH (ref 11.5–15.5)
WBC: 6 K/uL (ref 4.0–10.5)
nRBC: 0 % (ref 0.0–0.2)

## 2023-09-16 LAB — BASIC METABOLIC PANEL WITH GFR
Anion gap: 8 (ref 5–15)
BUN: 17 mg/dL (ref 6–20)
CO2: 24 mmol/L (ref 22–32)
Calcium: 8.9 mg/dL (ref 8.9–10.3)
Chloride: 104 mmol/L (ref 98–111)
Creatinine, Ser: 1.03 mg/dL — ABNORMAL HIGH (ref 0.44–1.00)
GFR, Estimated: >60 mL/min (ref >60)
Glucose, Bld: 109 mg/dL — ABNORMAL HIGH (ref 70–99)
Potassium: 4.2 mmol/L (ref 3.5–5.1)
Sodium: 136 mmol/L (ref 135–145)

## 2023-09-16 LAB — POC URINE PREG, ED: Preg Test, Ur: NEGATIVE

## 2023-09-16 LAB — ACETAMINOPHEN LEVEL: Acetaminophen (Tylenol), Serum: <10 ug/mL — ABNORMAL LOW (ref 10–30)

## 2023-09-16 MED ORDER — LEVETIRACETAM 500 MG PO TABS
500.0000 mg | ORAL_TABLET | Freq: Two times a day (BID) | ORAL | Status: DC
Start: 2023-09-16 — End: 2023-09-19
  Administered 2023-09-16 – 2023-09-18 (×5): 500 mg via ORAL
  Filled 2023-09-16 (×7): qty 1

## 2023-09-16 MED ORDER — ACETAMINOPHEN 500 MG PO TABS
1000.0000 mg | ORAL_TABLET | Freq: Once | ORAL | Status: AC
  Administered 2023-09-16: 1000 mg via ORAL
  Filled 2023-09-16: qty 2

## 2023-09-16 MED ORDER — DIVALPROEX SODIUM ER 500 MG PO TB24
1000.0000 mg | ORAL_TABLET | Freq: Two times a day (BID) | ORAL | Status: DC
  Administered 2023-09-16 – 2023-09-18 (×5): 1000 mg via ORAL
  Filled 2023-09-16: qty 2
  Filled 2023-09-16 (×3): qty 4
  Filled 2023-09-16 (×2): qty 2

## 2023-09-16 NOTE — ED Notes (Signed)
Snacks given to pt.

## 2023-09-16 NOTE — ED Notes (Signed)
 Pt given warm blanket.

## 2023-09-16 NOTE — ED Notes (Signed)
 Pt given lunch tray by EDT [place]
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
## 2023-09-16 NOTE — ED Provider Notes (Signed)
 [facility] Provider Note    Event Date/Time   First MD Initiated Contact with Patient 09/16/23 1009     (approximate)   History   SI   HPI  Sherry Bryan is a 28 y.o. female with a history of intellectual disability, adjustment disorder, depressive disorder, suicidal ideations who presents for suicidal ideation and thoughts of self-harm, she has been thinking of cutting herself.  She is not happy at her group home     Physical Exam   Triage Vital Signs: ED Triage Vitals  Encounter Vitals Group     BP      Girls Systolic BP Percentile      Girls Diastolic BP Percentile      Boys Systolic BP Percentile      Boys Diastolic BP Percentile      Pulse      Resp      Temp      Temp src      SpO2      Weight      Height      Head Circumference      Peak Flow      Pain Score      Pain Loc      Pain Education      Exclude from Growth Chart     Most recent vital signs: There were no vitals filed for this visit.   General: Awake, no distress.  CV:  Good peripheral perfusion.  Resp:  Normal effort.  Abd:  No distention.  Other:     ED Results / Procedures / Treatments   Labs (all labs ordered are listed, but only abnormal results are displayed) Labs Reviewed  CBC - Abnormal; Notable for the following components:      Result Value   Hemoglobin 11.1 (*)    MCV 73.7 (*)    MCH 22.5 (*)    RDW 18.0 (*)    All other components within normal limits  BASIC METABOLIC PANEL WITH GFR - Abnormal; Notable for the following components:   Glucose, Bld 109 (*)    Creatinine, Ser 1.03 (*)    All other components within normal limits  ACETAMINOPHEN  LEVEL - Abnormal; Notable for the following components:   Acetaminophen  (Tylenol ), Serum <10 (*)    All other components within normal limits  POC URINE PREG, ED     EKG     RADIOLOGY     PROCEDURES:  Critical Care performed:   Procedures   MEDICATIONS ORDERED IN ED: Medications  - No data to display   IMPRESSION / MDM / ASSESSMENT AND PLAN / ED COURSE  I reviewed the triage vital signs and the nursing notes. Patient's presentation is most consistent with severe exacerbation of chronic illness.  Patient overall well-appearing physically and in no acute distress, no evidence of self-harm, no reports of overdose or access to medications.    Lab work is unremarkable, cleared for psychiatric evaluation  The patient has been placed in psychiatric observation due to the need to provide a safe environment for the patient while obtaining psychiatric consultation and evaluation, as well as ongoing medical and medication management to treat the patient's condition.  The patient has not been placed under full IVC at this time.         FINAL CLINICAL IMPRESSION(S) / ED DIAGNOSES   Final diagnoses:  Suicidal ideations     Rx / DC Orders   ED Discharge Orders  None        Note:  This document was prepared using Dragon voice recognition software and may include unintentional dictation errors.   Arlander Charleston, MD 09/16/23 [phone]

## 2023-09-16 NOTE — ED Notes (Signed)
 EDT and this RN attempted to draw blood without success, Lab called for phlebotomy to see pt.

## 2023-09-16 NOTE — ED Notes (Signed)
 Pt Received at breakfast Tray

## 2023-09-16 NOTE — ED Notes (Signed)
 Sherry Bryan, [facility], calling asking about pt being DC, informed pt was not ready for DC yet, still waiting for psych team to speak with pt. Mrs Bryan asking for her to be call back number for updates and DC plan. Greig Bryan - [phone]

## 2023-09-16 NOTE — ED Notes (Signed)
 Attempted to call [facility], [facility], listed as one of her LG, to inform of decision to admit pt for inpatient treatment. HIPAA compliant VM left for Arnaldo Riding to call back.

## 2023-09-16 NOTE — ED Notes (Signed)
 Pt up to use restroom. Steady gait, no assistance required.

## 2023-09-16 NOTE — BH Assessment (Signed)
 Per [facility] ([place]), patient to be referred out of system.  Referral information for Psychiatric Hospitalization faxed to;   [facility]  [phone]  [facility]  [phone]  [facility]  [phone]  [facility]  [phone]  [facility]  [phone]  [facility]  [phone]  [facility]  [phone]  [facility]  [phone]  [facility]  [phone]  [facility]  [phone]  [facility]  [phone]  [facility]  [phone]  [facility]  [phone]  [place] [facility]  [phone]  [facility] [address]  [phone]  [facility]  [phone]  [facility]  [phone]

## 2023-09-16 NOTE — ED Notes (Signed)
 PERSONAL BELONGING:  Navy blue slides [employer]  Tie-dyed Top White underwear

## 2023-09-16 NOTE — ED Notes (Signed)
Pt given phone 

## 2023-09-16 NOTE — ED Notes (Signed)
 Lab at bedside

## 2023-09-16 NOTE — ED Notes (Signed)
Pt given Dinner tray

## 2023-09-16 NOTE — ED Notes (Signed)
 Pt [facility] called to inform of inpatient status.
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
## 2023-09-16 NOTE — ED Notes (Signed)
Counselor at bedside.

## 2023-09-16 NOTE — ED Notes (Signed)
 Sherry Bryan, LG with [employer], called to info of pt here, HIPAA complaint VM left for her to call back if needed.

## 2023-09-16 NOTE — ED Notes (Signed)
Patient is vol pending psych consult

## 2023-09-16 NOTE — BH Assessment (Signed)
 Comprehensive Clinical Assessment (CCA) Screening, Triage and Referral Note  09/16/2023 Sherry Bryan [id number]  Chief Complaint:  Chief Complaint  Patient presents with   SI   Visit Diagnosis: MR/IDD  Sherry Bryan is 28 year old female who presents to the ER due to voicing SI. Patient states she was upset with the group home staff for telling her she has to return to the group home when she runaway. Patient also reports she has recently enjoyed doing several activities with the group home staff. Per the report of the group home staff, the patient runs away from the group home every chance she gets. She will either call 911 herself or have someone else in the community do it. She does this with the goal of coming to the ER and getting snacks. Group Home shared, when she is cleared, she's able to return.  Patient Reported Information How did you hear about us ? Self  What Is the Reason for Your Visit/Call Today? Patient brought to the ER due to voicing SI.  How Long Has This Been Causing You Problems? <Week  What Do You Feel Would Help You the Most Today? Treatment for Depression or other mood problem   Have You Recently Had Any Thoughts About Hurting Yourself? Yes  Are You Planning to Commit Suicide/Harm Yourself At This time? No   Have you Recently Had Thoughts About Hurting Someone Sherral? No  Are You Planning to Harm Someone at This Time? No  Explanation: Pt endorses having thoughts of suicide with a plan to cut herself.   Have You Used Any Alcohol or Drugs in the Past 24 Hours? No  How Long Ago Did You Use Drugs or Alcohol? No data recorded What Did You Use and How Much? No data recorded  Do You Currently Have a Therapist/Psychiatrist? Yes  Name of Therapist/Psychiatrist: Pt gets medication managaement through her group home.   Have You Been Recently Discharged From Any Office Practice or Programs? No  Explanation of Discharge From Practice/Program:  N/A    CCA Screening Triage Referral Assessment Type of Contact: Face-to-Face  Telemedicine Service Delivery:   Is this Initial or Reassessment?   Date Telepsych consult ordered in [facility]:    Time Telepsych consult ordered in [facility]:    Location of Assessment: [facility] ED  Provider Location: [facility] ED    Collateral Involvement: None provided   Does Patient Have a Court Appointed Legal Guardian? No data recorded Name and Contact of Legal Guardian: No data recorded If Minor and Not Living with Parent(s), Who has Custody? N/A  Is CPS involved or ever been involved? Never  Is APS involved or ever been involved? Never   Patient Determined To Be At Risk for Harm To Self or Others Based on Review of Patient Reported Information or Presenting Complaint? No  Method: Plan without intent  Availability of Means: No access or NA  Intent: Vague intent or NA  Notification Required: No need or identified person  Additional Information for Danger to Others Potential: Previous attempts  Additional Comments for Danger to Others Potential: n/a  Are There Guns or Other Weapons in Your Home? No  Types of Guns/Weapons: n/a  Are These Weapons Safely Secured?                            No  Who Could Verify You Are Able To Have These Secured: n/a  Do You Have any Outstanding Charges, Pending Court Dates, Parole/Probation? None  Reported  Contacted To Inform of Risk of Harm To Self or Others: -- (n/a)   Does Patient Present under Involuntary Commitment? No   County of Residence: [place]   Patient Currently Receiving the Following Services: Group Home   Determination of Need: Emergent (2 hours)   Options For Referral: ED Visit   Disposition Recommendation per psychiatric provider: Pending Psych consult  Kiki DOROTHA Barge MS, LCAS, [employer], NCC Therapeutic Triage Specialist 09/16/2023 2:39 PM

## 2023-09-17 MED ORDER — APIXABAN 5 MG PO TABS
5.0000 mg | ORAL_TABLET | Freq: Two times a day (BID) | ORAL | Status: DC
Start: 2023-09-17 — End: 2023-09-19
  Administered 2023-09-17 – 2023-09-18 (×4): 5 mg via ORAL
  Filled 2023-09-17 (×4): qty 1

## 2023-09-17 MED ORDER — METOPROLOL SUCCINATE ER 50 MG PO TB24
50.0000 mg | ORAL_TABLET | Freq: Every day | ORAL | Status: DC
  Administered 2023-09-17 – 2023-09-18 (×2): 50 mg via ORAL
  Filled 2023-09-17 (×2): qty 1

## 2023-09-17 MED ORDER — PRAZOSIN HCL 1 MG PO CAPS
2.0000 mg | ORAL_CAPSULE | Freq: Every day | ORAL | Status: DC
Start: 2023-09-17 — End: 2023-09-19
  Administered 2023-09-17: 2 mg via ORAL
  Filled 2023-09-17: qty 2
  Filled 2023-09-17: qty 1
  Filled 2023-09-17: qty 2

## 2023-09-17 MED ORDER — FLUTICASONE PROPIONATE 50 MCG/ACT NA SUSP: 1.0000 | Freq: Two times a day (BID) | NASAL | Status: DC | PRN

## 2023-09-17 MED ORDER — ADULT MULTIVITAMIN W/MINERALS CH
1.0000 | ORAL_TABLET | Freq: Every day | ORAL | Status: DC
  Administered 2023-09-17 – 2023-09-18 (×2): 1 via ORAL
  Filled 2023-09-17 (×2): qty 1

## 2023-09-17 MED ORDER — TRAZODONE HCL 100 MG PO TABS
300.0000 mg | ORAL_TABLET | Freq: Every day | ORAL | Status: DC
  Administered 2023-09-17 – 2023-09-18 (×2): 300 mg via ORAL
  Filled 2023-09-17 (×2): qty 3

## 2023-09-17 MED ORDER — ESCITALOPRAM OXALATE 10 MG PO TABS
10.0000 mg | ORAL_TABLET | Freq: Every day | ORAL | Status: DC
  Administered 2023-09-17 – 2023-09-18 (×2): 10 mg via ORAL
  Filled 2023-09-17 (×2): qty 1

## 2023-09-17 MED ORDER — XANOMELINE-TROSPIUM CHLORIDE 100-20 MG PO CAPS: 1.0000 | ORAL_CAPSULE | Freq: Every day | ORAL | Status: DC

## 2023-09-17 MED ORDER — FOLIC ACID 1 MG PO TABS
1.0000 mg | ORAL_TABLET | Freq: Every day | ORAL | Status: DC
  Administered 2023-09-17 – 2023-09-18 (×2): 1 mg via ORAL
  Filled 2023-09-17 (×2): qty 1

## 2023-09-17 MED ORDER — PANTOPRAZOLE SODIUM 40 MG PO TBEC
40.0000 mg | DELAYED_RELEASE_TABLET | Freq: Every day | ORAL | Status: DC
  Administered 2023-09-17 – 2023-09-18 (×2): 40 mg via ORAL
  Filled 2023-09-17 (×2): qty 1

## 2023-09-17 MED ORDER — BENZTROPINE MESYLATE 1 MG PO TABS
1.0000 mg | ORAL_TABLET | Freq: Two times a day (BID) | ORAL | Status: DC
  Administered 2023-09-17 – 2023-09-18 (×4): 1 mg via ORAL
  Filled 2023-09-17 (×4): qty 1

## 2023-09-17 MED ORDER — DEUTETRABENAZINE ER 24 MG PO TB24: 1.0000 | ORAL_TABLET | Freq: Every day | ORAL | Status: DC

## 2023-09-17 MED ORDER — LORAZEPAM 1 MG PO TABS
1.0000 mg | ORAL_TABLET | Freq: Three times a day (TID) | ORAL | Status: DC
  Administered 2023-09-17 – 2023-09-18 (×5): 1 mg via ORAL
  Filled 2023-09-17 (×5): qty 1

## 2023-09-17 MED ORDER — LEVOTHYROXINE SODIUM 25 MCG PO TABS
50.0000 ug | ORAL_TABLET | Freq: Every day | ORAL | Status: DC
  Administered 2023-09-18 – 2023-09-19 (×2): 50 ug via ORAL
  Filled 2023-09-17 (×2): qty 2

## 2023-09-17 NOTE — ED Notes (Signed)
 Patient moved to BHU 2 from main ED.  Patient oriented to unit regarding rounding and cameras.  Patient instructed to come to nsg station for any needs.

## 2023-09-17 NOTE — ED Notes (Signed)
Snack given to patient.  

## 2023-09-17 NOTE — ED Notes (Signed)
 Assumed care of patient.

## 2023-09-17 NOTE — ED Notes (Signed)
Pt received meal tray and beverage at this time. 

## 2023-09-17 NOTE — ED Notes (Signed)
 Vol /pending placement /moved to [employer] 2

## 2023-09-17 NOTE — ED Notes (Signed)
 Pt given phone within phone hours to call GH. Pt told group home that she is able to leave and is wanting to go back home. Per psych team that visited with her an hour ago, pt is to be admitted for inpatient psych due to [facility] still being present. This RN spoke with [facility] and informed them of POC.

## 2023-09-17 NOTE — ED Provider Notes (Signed)
 Emergency Medicine Observation Re-evaluation Note  Sherry Bryan is a 28 y.o. female, seen on rounds today.  Pt initially presented to the ED for complaints of SI Currently, the patient is resting.  Physical Exam  BP 107/66 (BP Location: Left Wrist)   Pulse 85   Temp 98.2 F (36.8 C) (Oral)   Resp 17   Ht 1.626 m (5' 4)   Wt 122.5 kg   LMP  (LMP Unknown)   SpO2 94%   BMI 46.34 kg/m  Physical Exam Gen:  No acute distress Resp:  Breathing easily and comfortably, no accessory muscle usage Neuro:  Moving all four extremities, no gross focal neuro deficits Psych:  Resting currently, calm when awake  ED Course / MDM  EKG:   I have reviewed the labs performed to date as well as medications administered while in observation.  Recent changes in the last 24 hours include initial evaluations.  Plan  Current plan is for psychiatric placement.    Gordan Huxley, MD 09/17/23 [phone]
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
## 2023-09-18 NOTE — Progress Notes (Signed)
 Patient has been accepted to [facility] for 09/19/23 pending LG consent. Patient was assigned to [facility]. Accepting physician is Dr. Millie Manners. Call report to [phone] Option 2 (Please leave voicemail). Representative was [employer]   ER Staff is aware of it: Olam, ER Secretary Dr. Waymond, ER MD Slater PEAK, Patient's Nurse   Address:  [address]                  [place], [place] [address]

## 2023-09-18 NOTE — ED Notes (Signed)
 Lunch tray given to patient. Eating in day room. Calm and cooperative

## 2023-09-18 NOTE — Progress Notes (Signed)
 Per TTS, patient was re-faxed to the following facilities:   Service Provider Phone  [facility]  [phone]  [facility]  [phone]  [facility]  [phone]  [facility]  [phone]  [facility]  [phone]  [facility]  [phone]  [facility]  [phone]  [facility]  [phone]  [facility]  [phone]  [facility]  [phone]  [facility]  [phone]  [place] [facility]  [phone]  [facility]  [phone]  [facility]  [phone]  [facility]  [phone], [place] [phone]

## 2023-09-18 NOTE — ED Notes (Signed)
 PT up to restroom and back to her room. No other needs at this time.

## 2023-09-18 NOTE — ED Notes (Signed)
 This RN spoke to [facility] Nurse about pt. [facility] will be looking into possibly accepting.

## 2023-09-18 NOTE — ED Notes (Signed)
 VOL  GOING TO [facility] ON 09/19/23

## 2023-09-18 NOTE — ED Notes (Signed)
 Pt up to restroom and requesting TV be turned on. Security turned on pt's tv.

## 2023-09-18 NOTE — ED Provider Notes (Signed)
 Emergency Medicine Observation Re-evaluation Note  Sherry Bryan is a 28 y.o. female, seen on rounds today.  Pt initially presented to the ED for complaints of SI Currently, the patient is resting.  Physical Exam  BP 132/74 (BP Location: Left Arm)   Pulse 93   Temp 98 F (36.7 C) (Oral)   Resp 18   Ht 5' 4 (1.626 m)   Wt 122.5 kg   LMP  (LMP Unknown)   SpO2 97%   BMI 46.34 kg/m  Physical Exam .Gen:  No acute distress Resp:  Breathing easily and comfortably, no accessory muscle usage Neuro:  Moving all four extremities, no gross focal neuro deficits Psych:  Resting currently, calm when awake   ED Course / MDM  EKG:   I have reviewed the labs performed to date as well as medications administered while in observation.  Recent changes in the last 24 hours include no acute events.  Plan  Current plan is for pending psyc dispo.    Waymond Lorelle Cummins, MD 09/18/23 [phone]

## 2023-09-18 NOTE — ED Notes (Signed)
Dinner tray and drink provided to pt.

## 2023-09-18 NOTE — ED Notes (Addendum)
 Pt given snack and drink at this time. Pt calm and cooperative.

## 2023-09-18 NOTE — ED Notes (Signed)
Using phone 

## 2023-09-18 NOTE — ED Notes (Signed)
 Shower supplies and clean BH scrubs provided to pt.

## 2023-09-19 NOTE — ED Notes (Signed)
EMTALA reviewed by Charge RN 

## 2023-09-19 NOTE — ED Notes (Signed)
 Pt stated she was not ready to eat at this time, vitals where completed.

## 2023-09-19 NOTE — Progress Notes (Signed)
 [facility] spoke to [facility] and confirmed all required consent was received from legal guardian. [facility] updated Geographical information systems officer.   Clarita, [facility] [phone]

## 2023-09-19 NOTE — ED Provider Notes (Addendum)
 Emergency Medicine Observation Re-evaluation Note  Sherry Bryan is a 28 y.o. female, seen on rounds today.  Pt initially presented to the ED for complaints of SI  Currently, the patient is is no acute distress. Denies any concerns at this time.  Physical Exam  Blood pressure 106/75, pulse 60, temperature 98.5 F (36.9 C), temperature source Oral, resp. rate 18, height 5' 4 (1.626 m), weight 122.5 kg, SpO2 95%.  Physical Exam: General: No apparent distress Pulm: Normal WOB Neuro: Moving all extremities Psych: Resting comfortably     ED Course / MDM     I have reviewed the labs performed to date as well as medications administered while in observation.  Recent changes in the last 24 hours include: No acute events overnight. Patient accepted to [facility].   Plan   Current plan: Patient awaiting placement.  Accepted to [facility], transferred  Patient is not under full IVC at this time.    Suzanne Kirsch, MD 09/19/23 0730    Suzanne Kirsch, MD 09/19/23 1102

## 2023-12-14 ENCOUNTER — Other Ambulatory Visit: Payer: Self-pay

## 2023-12-14 ENCOUNTER — Emergency Department
Admission: EM | Admit: 2023-12-14 | Discharge: 2023-12-15 | Disposition: A | Payer: MEDICAID | Attending: Emergency Medicine | Admitting: Emergency Medicine

## 2023-12-14 DIAGNOSIS — M25511 Pain in right shoulder: Secondary | ICD-10-CM | POA: Insufficient documentation

## 2023-12-14 DIAGNOSIS — F4323 Adjustment disorder with mixed anxiety and depressed mood: Secondary | ICD-10-CM

## 2023-12-14 DIAGNOSIS — F329 Major depressive disorder, single episode, unspecified: Secondary | ICD-10-CM | POA: Insufficient documentation

## 2023-12-14 DIAGNOSIS — F29 Unspecified psychosis not due to a substance or known physiological condition: Secondary | ICD-10-CM | POA: Insufficient documentation

## 2023-12-14 DIAGNOSIS — Z79899 Other long term (current) drug therapy: Secondary | ICD-10-CM | POA: Insufficient documentation

## 2023-12-14 DIAGNOSIS — R45851 Suicidal ideations: Secondary | ICD-10-CM | POA: Insufficient documentation

## 2023-12-14 DIAGNOSIS — F79 Unspecified intellectual disabilities: Secondary | ICD-10-CM

## 2023-12-14 DIAGNOSIS — Z7901 Long term (current) use of anticoagulants: Secondary | ICD-10-CM | POA: Insufficient documentation

## 2023-12-14 LAB — CBC
HCT: 40.6 % (ref 36.0–46.0)
Hemoglobin: 12.6 g/dL (ref 12.0–15.0)
MCH: 24.3 pg — ABNORMAL LOW (ref 26.0–34.0)
MCHC: 31 g/dL (ref 30.0–36.0)
MCV: 78.4 fL — ABNORMAL LOW (ref 80.0–100.0)
Platelets: 196 K/uL (ref 150–400)
RBC: 5.18 MIL/uL — ABNORMAL HIGH (ref 3.87–5.11)
RDW: 21.9 % — ABNORMAL HIGH (ref 11.5–15.5)
WBC: 9.9 K/uL (ref 4.0–10.5)
nRBC: 0.5 % — ABNORMAL HIGH (ref 0.0–0.2)

## 2023-12-14 LAB — URINE DRUG SCREEN
Amphetamines: NEGATIVE
Barbiturates: NEGATIVE
Benzodiazepines: NEGATIVE
Cocaine: NEGATIVE
Fentanyl: NEGATIVE
Methadone Scn, Ur: NEGATIVE
Opiates: NEGATIVE
Tetrahydrocannabinol: NEGATIVE

## 2023-12-14 LAB — PREGNANCY, URINE: Preg Test, Ur: NEGATIVE

## 2023-12-14 LAB — COMPREHENSIVE METABOLIC PANEL WITH GFR
ALT: 8 U/L (ref 0–44)
AST: 15 U/L (ref 15–41)
Albumin: 3.4 g/dL — ABNORMAL LOW (ref 3.5–5.0)
Alkaline Phosphatase: 64 U/L (ref 38–126)
Anion gap: 11 (ref 5–15)
BUN: 11 mg/dL (ref 6–20)
CO2: 27 mmol/L (ref 22–32)
Calcium: 9.1 mg/dL (ref 8.9–10.3)
Chloride: 98 mmol/L (ref 98–111)
Creatinine, Ser: 0.95 mg/dL (ref 0.44–1.00)
GFR, Estimated: >60 mL/min (ref >60)
Glucose, Bld: 151 mg/dL — ABNORMAL HIGH (ref 70–99)
Potassium: 3.7 mmol/L (ref 3.5–5.1)
Sodium: 136 mmol/L (ref 135–145)
Total Bilirubin: 0.3 mg/dL (ref 0.0–1.2)
Total Protein: 7.4 g/dL (ref 6.5–8.1)

## 2023-12-14 LAB — ETHANOL: Alcohol, Ethyl (B): <15 mg/dL (ref <15)

## 2023-12-14 MED ORDER — DEUTETRABENAZINE ER 24 MG PO TB24: 1.0000 | ORAL_TABLET | Freq: Every day | ORAL | Status: DC

## 2023-12-14 MED ORDER — IBUPROFEN 600 MG PO TABS: 600.0000 mg | ORAL_TABLET | Freq: Three times a day (TID) | ORAL | Status: DC | PRN

## 2023-12-14 MED ORDER — BENZTROPINE MESYLATE 1 MG PO TABS
1.0000 mg | ORAL_TABLET | Freq: Two times a day (BID) | ORAL | Status: DC
  Administered 2023-12-14 – 2023-12-15 (×2): 1 mg via ORAL
  Filled 2023-12-14 (×2): qty 1

## 2023-12-14 MED ORDER — XANOMELINE-TROSPIUM CHLORIDE 100-20 MG PO CAPS: 1.0000 | ORAL_CAPSULE | Freq: Every day | ORAL | Status: DC

## 2023-12-14 MED ORDER — LORAZEPAM 1 MG PO TABS
1.0000 mg | ORAL_TABLET | Freq: Three times a day (TID) | ORAL | Status: DC
  Administered 2023-12-14 – 2023-12-15 (×3): 1 mg via ORAL
  Filled 2023-12-14 (×3): qty 1

## 2023-12-14 MED ORDER — ALUM & MAG HYDROXIDE-SIMETH 200-200-20 MG/5ML PO SUSP: 30.0000 mL | Freq: Four times a day (QID) | ORAL | Status: DC | PRN

## 2023-12-14 MED ORDER — FOLIC ACID 1 MG PO TABS
1.0000 mg | ORAL_TABLET | Freq: Every day | ORAL | Status: DC
  Administered 2023-12-14 – 2023-12-15 (×2): 1 mg via ORAL
  Filled 2023-12-14 (×2): qty 1

## 2023-12-14 MED ORDER — HYDROCHLOROTHIAZIDE 25 MG PO TABS
25.0000 mg | ORAL_TABLET | Freq: Every day | ORAL | Status: DC
  Administered 2023-12-14 – 2023-12-15 (×2): 25 mg via ORAL
  Filled 2023-12-14 (×2): qty 1

## 2023-12-14 MED ORDER — ALBUTEROL SULFATE (2.5 MG/3ML) 0.083% IN NEBU: 3.0000 mL | INHALATION_SOLUTION | Freq: Four times a day (QID) | RESPIRATORY_TRACT | Status: DC | PRN

## 2023-12-14 MED ORDER — PRAZOSIN HCL 2 MG PO CAPS
2.0000 mg | ORAL_CAPSULE | Freq: Every day | ORAL | Status: DC
  Administered 2023-12-14: 2 mg via ORAL
  Filled 2023-12-14: qty 1

## 2023-12-14 MED ORDER — LEVOTHYROXINE SODIUM 50 MCG PO TABS
50.0000 ug | ORAL_TABLET | Freq: Every day | ORAL | Status: DC
  Administered 2023-12-15: 50 ug via ORAL
  Filled 2023-12-14: qty 1

## 2023-12-14 MED ORDER — METOPROLOL SUCCINATE ER 50 MG PO TB24
50.0000 mg | ORAL_TABLET | Freq: Every day | ORAL | Status: DC
  Administered 2023-12-14 – 2023-12-15 (×2): 50 mg via ORAL
  Filled 2023-12-14 (×2): qty 1

## 2023-12-14 MED ORDER — PANTOPRAZOLE SODIUM 40 MG PO TBEC
40.0000 mg | DELAYED_RELEASE_TABLET | Freq: Every day | ORAL | Status: DC
  Administered 2023-12-14 – 2023-12-15 (×2): 40 mg via ORAL
  Filled 2023-12-14 (×2): qty 1

## 2023-12-14 MED ORDER — ONDANSETRON HCL 4 MG PO TABS: 4.0000 mg | ORAL_TABLET | Freq: Three times a day (TID) | ORAL | Status: DC | PRN

## 2023-12-14 MED ORDER — APIXABAN 5 MG PO TABS
5.0000 mg | ORAL_TABLET | Freq: Two times a day (BID) | ORAL | Status: DC
  Administered 2023-12-14 – 2023-12-15 (×2): 5 mg via ORAL
  Filled 2023-12-14 (×2): qty 1

## 2023-12-14 MED ORDER — LEVETIRACETAM 500 MG PO TABS
500.0000 mg | ORAL_TABLET | Freq: Two times a day (BID) | ORAL | Status: DC
  Administered 2023-12-14 – 2023-12-15 (×2): 500 mg via ORAL
  Filled 2023-12-14 (×2): qty 1

## 2023-12-14 NOTE — ED Notes (Addendum)
 Belongings: Print jacket Striped pants Blue shoes International paper Pink bra Blue socks    Pt reports currently having tampon in, also wearing briefs

## 2023-12-14 NOTE — ED Notes (Signed)
Vol/pending psych consult. 

## 2023-12-14 NOTE — ED Notes (Signed)
 Received call back from Richelle, informed of arrival

## 2023-12-14 NOTE — ED Notes (Addendum)
 Attempted to contact Legal guardian [name] at number listed, no answer. HIPAA compliant VM left.

## 2023-12-14 NOTE — BH Assessment (Signed)
 Comprehensive Clinical Assessment (CCA) Note  12/14/2023 Sherry Bryan [id number]  Chief Complaint:  Chief Complaint  Patient presents with   Suicidal   Visit Diagnosis: Depressive Disorder   Sherry Bryan is a 28 year old female who presents to the ER due to having thoughts of ending her life. She states she is upset because she misses her parents and her sister. She has the plan of hanging herself. During the interview the patient was calm, cooperative and pleasant. She was able to provide appropriate answers to the questions. She denies HI and AV/H. She denies the use of mind-altering substances.  CCA Screening, Triage and Referral (STR)  Patient Reported Information How did you hear about us ? Self  What Is the Reason for Your Visit/Call Today? Patient having thoughts of ending her life.  How Long Has This Been Causing You Problems? 1 wk - 1 month  What Do You Feel Would Help You the Most Today? Treatment for Depression or other mood problem   Have You Recently Had Any Thoughts About Hurting Yourself? Yes  Are You Planning to Commit Suicide/Harm Yourself At This time? No   Flowsheet Row ED from 12/14/2023 in [facility] Emergency Department at [facility] ED from 09/16/2023 in [facility] Emergency Department at [facility] ED from 08/19/2023 in [facility] Emergency Department at [facility]  C-SSRS RISK CATEGORY High Risk No Risk High Risk    Have you Recently Had Thoughts About Hurting Someone Sherry Bryan? No  Are You Planning to Harm Someone at This Time? No  Explanation: Pt endorses having thoughts of suicide with a plan to cut herself.   Have You Used Any Alcohol or Drugs in the Past 24 Hours? No  How Long Ago Did You Use Drugs or Alcohol? No data recorded What Did You Use and How Much? No data recorded  Do You Currently Have a Therapist/Psychiatrist? Yes  Name of Therapist/Psychiatrist: Name of Therapist/Psychiatrist: via group home   Have You  Been Recently Discharged From Any Office Practice or Programs? No  Explanation of Discharge From Practice/Program: N/A     CCA Screening Triage Referral Assessment Type of Contact: Face-to-Face  Telemedicine Service Delivery:   Is this Initial or Reassessment?   Date Telepsych consult ordered in [facility]:    Time Telepsych consult ordered in CHL:    Location of Assessment: [facility] ED  Provider Location: [facility] ED   Collateral Involvement: None provided   Does Patient Have a Court Appointed Legal Guardian? Yes Other:  Legal Guardian Contact Information: [phone]  Copy of Legal Guardianship Form: Yes  Legal Guardian Notified of Arrival: Attempted notification unsuccessful  Legal Guardian Notified of Pending Discharge: Attempted notification unsuccessful  If Minor and Not Living with Parent(s), Who has Custody? N/A  Is CPS involved or ever been involved? Never  Is APS involved or ever been involved? Never   Patient Determined To Be At Risk for Harm To Self or Others Based on Review of Patient Reported Information or Presenting Complaint? No  Method: Plan without intent  Availability of Means: No access or NA  Intent: Vague intent or NA  Notification Required: No need or identified person  Additional Information for Danger to Others Potential: Previous attempts  Additional Comments for Danger to Others Potential: n/a  Are There Guns or Other Weapons in Your Home? No  Types of Guns/Weapons: n/a  Are These Weapons Safely Secured?  No  Who Could Verify You Are Able To Have These Secured: n/a  Do You Have any Outstanding Charges, Pending Court Dates, Parole/Probation? None Reported  Contacted To Inform of Risk of Harm To Self or Others: -- (n/a)    Does Patient Present under Involuntary Commitment? No    [place] of Residence: [place]   Patient Currently Receiving the Following Services: Group Home   Determination of Need:  Emergent (2 hours)   Options For Referral: ED Visit     CCA Biopsychosocial Patient Reported Schizophrenia/Schizoaffective Diagnosis in Past: No   Strengths: Have stable housing, have support system and able to complete her ADL's.   Mental Health Symptoms Depression:  Hopelessness   Duration of Depressive symptoms:    Mania:  None   Anxiety:   Worrying   Psychosis:  None   Duration of Psychotic symptoms:    Trauma:  N/A   Obsessions:  N/A   Compulsions:  N/A   Inattention:  N/A   Hyperactivity/Impulsivity:  N/A   Oppositional/Defiant Behaviors:  N/A   Emotional Irregularity:  N/A   Other Mood/Personality Symptoms:  No data recorded   Mental Status Exam Appearance and self-care  Stature:  Average   Weight:  Overweight   Clothing:  Neat/clean; Age-appropriate   Grooming:  Normal   Cosmetic use:  None   Posture/gait:  Normal   Motor activity:  -- (Within normal range)   Sensorium  Attention:  Normal   Concentration:  Normal   Orientation:  X5   Recall/memory:  Normal   Affect and Mood  Affect:  Appropriate   Mood:  Depressed   Relating  Eye contact:  Normal   Facial expression:  Responsive   Attitude toward examiner:  Cooperative   Thought and Language  Speech flow: Clear and Coherent   Thought content:  Appropriate to Mood and Circumstances   Preoccupation:  None   Hallucinations:  None   Organization:  Coherent   [employer] of Knowledge:  Fair   Intelligence:  Average   Abstraction:  Functional   Judgement:  Fair   Dance Movement Psychotherapist:  Distorted   Insight:  Poor   Decision Making:  Confused   Social Functioning  Social Maturity:  Self-centered   Social Judgement:  Heedless   Stress  Stressors:  Other (Comment)   Coping Ability:  Overwhelmed   Skill Deficits:  None   Supports:  Family; Friends/Service system     Religion: Religion/Spirituality Are You A Religious Person?:  No  Leisure/Recreation: Leisure / Recreation Do You Have Hobbies?: No  Exercise/Diet: Exercise/Diet Do You Exercise?: No Have You Gained or Lost A Significant Amount of Weight in the Past Six Months?: No Do You Follow a Special Diet?: No Do You Have Any Trouble Sleeping?: No   CCA Employment/Education Employment/Work Situation: Employment / Work Situation Employment Situation: On disability Why is Patient on Disability: Mental Health Patient's Job has Been Impacted by Current Illness: No Has Patient ever Been in the [employer]?: No  Education: Education Is Patient Currently Attending School?: No Did You Product Manager?: No Did You Have An Individualized Education Program (IIEP): Yes Did You Have Any Difficulty At School?: No Patient's Education Has Been Impacted by Current Illness: No   CCA Family/Childhood History Family and Relationship History: Family history Marital status: Single Does patient have children?: No  Childhood History:  Childhood History By whom was/is the patient raised?: Both parents Did patient suffer any verbal/emotional/physical/sexual abuse as a child?:  No Did patient suffer from severe childhood neglect?: No Has patient ever been sexually abused/assaulted/raped as an adolescent or adult?: No Was the patient ever a victim of a crime or a disaster?: No Witnessed domestic violence?: No Has patient been affected by domestic violence as an adult?: No   CCA Substance Use Alcohol/Drug Use: Alcohol / Drug Use Pain Medications: See MAR Prescriptions: See MAR Over the Counter: See MAR History of alcohol / drug use?: No history of alcohol / drug abuse Longest period of sobriety (when/how long): n/a     ASAM's:  Six Dimensions of Multidimensional Assessment  Dimension 1:  Acute Intoxication and/or Withdrawal Potential:      Dimension 2:  Biomedical Conditions and Complications:      Dimension 3:  Emotional, Behavioral, or Cognitive Conditions  and Complications:     Dimension 4:  Readiness to Change:     Dimension 5:  Relapse, Continued use, or Continued Problem Potential:     Dimension 6:  Recovery/Living Environment:     ASAM Severity Score:    ASAM Recommended Level of Treatment:     Substance use Disorder (SUD)    Recommendations for Services/Supports/Treatments:    Disposition Recommendation per psychiatric provider: Pending Psych Consult   DSM5 Diagnoses: Patient Active Problem List   Diagnosis Date Noted   Conduct disorder, adolescent-onset type, 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   Aggressive behavior of adult 02/15/2023   Depression 01/28/2023   Abnormal CT of brain 02/01/2017   Borderline personality disorder (HCC)    Lithium  toxicity 12/19/2016   Suicidal ideation 12/19/2016   Homicidal ideations    Suicidal ideations    MDD (major depressive disorder), recurrent severe, 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   Intellectual disability 05/22/2015   MDD (major depressive disorder), recurrent, severe, 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   Disruptive mood dysregulation disorder 05/15/2015   Hx of seizure disorder 05/15/2015   Constipation 05/15/2015   Hx of gastroesophageal reflux (GERD) 05/15/2015   History of seasonal allergies 05/15/2015    Referrals to Alternative Service(s): Referred to Alternative Service(s):   Place:   Date:   Time:    Referred to Alternative Service(s):   Place:   Date:   Time:    Referred to Alternative Service(s):   Place:   Date:   Time:    Referred to Alternative Service(s):   Place:   Date:   Time:     Kiki DOROTHA Barge MS, LCAS, [facility], [facility] Therapeutic Triage Specialist 12/14/2023 5:21 PM

## 2023-12-14 NOTE — BH Assessment (Addendum)
 This clinical research associate attempted to contact [facility] via phone to request an assessment for this patient but there was no answer at [facility], this clinical research associate left a voicemail to return phone call  Update: 8:15pm- [facility] contacted this clinical research associate back, this clinical research associate requested an assessment for this patient, patient's name has been added to [employer] and is currently pending

## 2023-12-14 NOTE — ED Notes (Signed)
 Pt given snack and drink at this time. Pt calm and cooperative.

## 2023-12-14 NOTE — ED Provider Notes (Signed)
 [facility] Provider Note    Event Date/Time   First MD Initiated Contact with Patient 12/14/23 1553     (approximate)   History   Suicidal   HPI  Sherry Bryan is a 28 y.o. female with  major depressive disorder, seizures, blood clots on anticoagulation who presents with suicidal ideation over the last several days.  Patient states that she has felt sad at her group home and does not want to live any longer.  She states that she has a plan to cut or hang herself.  Denies any physical complaints.  Denies any concern for physical or sexual abuse.  She does want psychiatric help.      Physical Exam   Triage Vital Signs: ED Triage Vitals  Encounter Vitals Group     BP 12/14/23 1538 110/84     Girls Systolic BP Percentile --      Girls Diastolic BP Percentile --      Boys Systolic BP Percentile --      Boys Diastolic BP Percentile --      Pulse Rate 12/14/23 1538 98     Resp 12/14/23 1538 20     Temp 12/14/23 1536 98.2 F (36.8 C)     Temp src --      SpO2 12/14/23 1538 98 %     Weight 12/14/23 1536 (!) 310 lb (140.6 kg)     Height 12/14/23 1536 5' 4 (1.626 m)     Head Circumference --      Peak Flow --      Pain Score 12/14/23 1536 5     Pain Loc --      Pain Education --      Exclude from Growth Chart --     Most recent vital signs: Vitals:   12/14/23 1536 12/14/23 1538  BP:  110/84  Pulse:  98  Resp:  20  Temp: 98.2 F (36.8 C)   SpO2:  98%    Nursing Triage Note reviewed. Vital signs reviewed and patients oxygen saturation is normoxic  General: Patient is well nourished, well developed, awake and alert, resting comfortably in no acute distress Head: Normocephalic and atraumatic Eyes: Normal inspection, extraocular muscles intact, no conjunctival pallor Ear, nose, throat: Normal external exam Neck: Normal range of motion Respiratory: Patient is in no respiratory distress, lungs CTAB Cardiovascular: Patient is not  tachycardic, RRR without murmur appreciated GI: Abd SNT with no guarding or rebound  Back: Normal inspection of the back with good strength and range of motion throughout all ext Extremities: pulses intact with good cap refills, no LE pitting edema or calf tenderness Neuro: The patient is alert and oriented to person, place, and time, appropriately conversive, with 5/5 bilat UE/LE strength, no gross motor or sensory defects noted. Coordination appears to be adequate. Skin: Warm, dry, and intact Psych: depressed mood and affect, + SI, No HI  ED Results / Procedures / Treatments   Labs (all labs ordered are listed, but only abnormal results are displayed) Labs Reviewed  COMPREHENSIVE METABOLIC PANEL WITH GFR - Abnormal; Notable for the following components:      Result Value   Glucose, Bld 151 (*)    Albumin 3.4 (*)    All other components within normal limits  CBC - Abnormal; Notable for the following components:   RBC 5.18 (*)    MCV 78.4 (*)    MCH 24.3 (*)    RDW 21.9 (*)    nRBC  0.5 (*)    All other components within normal limits  ETHANOL  URINE DRUG SCREEN  POC URINE PREG, ED     EKG None  RADIOLOGY None    PROCEDURES:  Critical Care performed: No  Procedures   MEDICATIONS ORDERED IN ED: Medications  ibuprofen  (ADVIL ) tablet 600 mg (has no administration in time range)  ondansetron  (ZOFRAN ) tablet 4 mg (has no administration in time range)  alum & mag hydroxide-simeth (MAALOX/MYLANTA) 200-200-20 MG/5ML suspension 30 mL (has no administration in time range)  apixaban  (ELIQUIS ) tablet 5 mg (has no administration in time range)  Deutetrabenazine  ER TB24 1 tablet (has no administration in time range)  benztropine  (COGENTIN ) tablet 1 mg (has no administration in time range)  Xanomeline-Trospium  Chloride 100-20 MG CAPS 1 capsule (has no administration in time range)  folic acid  (FOLVITE ) tablet 1 mg (has no administration in time range)  hydrochlorothiazide   (HYDRODIURIL ) tablet 25 mg (has no administration in time range)  levETIRAcetam  (KEPPRA ) tablet 500 mg (has no administration in time range)  levothyroxine  (SYNTHROID ) tablet 50 mcg (has no administration in time range)  LORazepam  (ATIVAN ) tablet 1 mg (has no administration in time range)  metoprolol  succinate (TOPROL -XL) 24 hr tablet 50 mg (has no administration in time range)  pantoprazole  (PROTONIX ) EC tablet 40 mg (has no administration in time range)  prazosin  (MINIPRESS ) capsule 2 mg (has no administration in time range)  albuterol  (VENTOLIN  HFA) 108 (90 Base) MCG/ACT inhaler 2 puff (has no administration in time range)     IMPRESSION / MDM / ASSESSMENT AND PLAN / ED COURSE                                Differential diagnosis includes, but is not limited to, organic psychiatric disorder, substance use, anemia, electrolyte derangement   ED course: Patient without any evidence of any acute toxidrome.  Patient presented voluntarily and does want help.  She had no leukocytosis or electrolyte derangements.  Her urine drug screen still needs to be collected however this would not change my management.  Will keep the patient voluntarily as she does seem to have good insight into her condition.  The patient has been placed in psychiatric observation due to the need to provide a safe environment for the patient while obtaining psychiatric consultation and evaluation, as well as ongoing medical and medication management to treat the patient's condition.  The patient has not been placed under full IVC at this time.    Clinical Course as of 12/14/23 1835  Thu Dec 14, 2023  1733 Alcohol, Ethyl (B): <15 Not elevated [HD]  1733 cbc(!) No leukocytosis [HD]  1734 Comprehensive metabolic panel(!) No electrolyte derangements [HD]    Clinical Course User Index [HD] Nicholaus Rolland BRAVO, MD   -- Risk: 5 This patient has a high risk of morbidity due to further diagnostic testing or treatment.  Rationale: This patient's evaluation and management involve a high risk of morbidity due to the potential severity of presenting symptoms, need for diagnostic testing, and/or initiation of treatment that may require close monitoring. The differential includes conditions with potential for significant deterioration or requiring escalation of care. Treatment decisions in the ED, including medication administration, procedural interventions, or disposition planning, reflect this level of risk. COPA: 5 The patient has the following acute or chronic illness/injury that poses a possible threat to life or bodily function: [X] : The patient has a potentially serious acute  condition or an acute exacerbation of a chronic illness requiring urgent evaluation and management in the Emergency Department. The clinical presentation necessitates immediate consideration of life-threatening or function-threatening diagnoses, even if they are ultimately ruled out.   FINAL CLINICAL IMPRESSION(S) / ED DIAGNOSES   Final diagnoses:  Suicidal ideation     Rx / DC Orders   ED Discharge Orders     None        Note:  This document was prepared using Dragon voice recognition software and may include unintentional dictation errors.   Nicholaus Rolland BRAVO, MD 12/14/23 [phone]

## 2023-12-14 NOTE — ED Triage Notes (Signed)
 Pt to ED for SI, reports plan to cut self and hang self.

## 2023-12-15 ENCOUNTER — Emergency Department: Payer: MEDICAID

## 2023-12-15 MED ORDER — XANOMELINE-TROSPIUM CHLORIDE 100-20 MG PO CAPS: 1.0000 | ORAL_CAPSULE | Freq: Every day | ORAL | Status: DC

## 2023-12-15 MED ORDER — DEUTETRABENAZINE ER 24 MG PO TB24: 1.0000 | ORAL_TABLET | Freq: Every day | ORAL | Status: DC

## 2023-12-15 NOTE — ED Provider Notes (Signed)
 Emergency Medicine Observation Re-evaluation Note   BP 111/61 (BP Location: Right Wrist)   Pulse 83   Temp 98.2 F (36.8 C)   Resp 20   Ht 5' 4 (1.626 m)   Wt (!) 140.6 kg   SpO2 91%   BMI 53.21 kg/m    ED Course / MDM   No reported events during my shift at the time of this note.   Pt is awaiting dispo from consultants   Ginnie Shams MD    Shams Ginnie, MD 12/15/23 [phone]

## 2023-12-15 NOTE — ED Notes (Addendum)
 Staff from [facility] will come to pick pt up. Unsure of ETA

## 2023-12-15 NOTE — BH Assessment (Signed)
 TTS called and left a HIPPA Compliant message with [facility] DSS Legal Guardian (Lorrin [phone]), requesting a return phone call.  TTS spoke with the group home ([phone]), she states the patient have been wanting to come to the ER but they been able to redirect her. However, yesterday she went to get her injection to address her mental health needs but initially was refusing to go. The patient agreed to get the injection if she can come to the ER and get checked out. Upon arrival to the ER, she started to voice SI.  TTS called and left a HIPPA Compliant message with [facility] DSS Legal Guardian (Richelle [phone]), requesting a return phone call.  Received return phone call from patient's DSS guardian (Richelle), updated her about the patient disposition of been psych cleared. Guardian shared her concern about the patient's shoulder. Psych NP, communicated with ER MD about the concern.  Received phone call from other DSS Guardian (Lorrin) and informed her the psych team have spoken with Richelle and then shared with her the patient's disposition.

## 2023-12-15 NOTE — ED Notes (Signed)
 Hospital meal provided, pt tolerated w/o complaints.  Waste discarded appropriately.

## 2023-12-15 NOTE — ED Notes (Signed)
 Lunch tray provided to pt.

## 2023-12-15 NOTE — ED Provider Notes (Signed)
 Patient has been seen and evaluated by psychiatry.  They believe the patient safe for discharge home from a psychiatric standpoint.  Patient's medical workup has been nonrevealing.  Reassuring CBC reassuring chemistry, negative alcohol negative drug screen negative pregnancy test and a normal right shoulder x-ray.   Dorothyann Drivers, MD 12/15/23 [phone]

## 2023-12-15 NOTE — ED Notes (Addendum)
 This RN received call for DSS LG [phone]. LG concerned due to pt reporting right shoulder pain after she reported a dresser fell on top of her. EDP made aware

## 2023-12-15 NOTE — ED Notes (Addendum)
 Spoke with Avis [phone]) from group home who stated she would remind staff to come pick up patient ASAP

## 2023-12-15 NOTE — ED Notes (Signed)
 VOL/ pending psych assessment

## 2023-12-15 NOTE — Consult Note (Signed)
 [facility]  Patient Name: .Sherry Bryan  MRN: [id number]  DOB: 24-Jul-1995  Consult Order details:  Orders (From admission, onward)     Start     Ordered   12/14/23 1645  CONSULT TO CALL ACT TEAM       Ordering Provider: Nicholaus Rolland BRAVO, MD  Provider:  (Not yet assigned)  Question:  Reason for Consult?  Answer:  Psych consult   12/14/23 1644   12/14/23 1645  IP CONSULT TO PSYCHIATRY       Ordering Provider: Nicholaus Rolland BRAVO, MD  Provider:  (Not yet assigned)  Question:  Reason for consult:  Answer:  Medication management   12/14/23 1644             Mode of Visit: In person    Psychiatry Consult Evaluation  Service Date: December 15, 2023 LOS:  LOS: 0 days  Chief Complaint Reassessment from initial evaluation  Primary Psychiatric Diagnoses  Behavioral health concerns    Assessment   Patient was seen initially by [facility] telehealth providers.  They recommended that patient be reassessed once contact could be made with group home and guardian. TTS spoke with the group home ([phone]), she states the patient have been wanting to come to the ER but they been able to redirect her. However, yesterday she went to get her injection to address her mental health needs but initially was refusing to go. The patient agreed to get the injection if she can come to the ER and get checked out. Upon arrival to the ER, she started to voice SI.   TTS called and left a HIPPA Compliant message with [facility] DSS Legal Guardian (Richelle [phone]), requesting a return phone call.   Received return phone call from patient's DSS guardian (Richelle), updated her about the patient disposition of been psych cleared. Guardian shared her concern about the patient's shoulder. Psych NP, communicated with ER MD about the concern.   Received phone call from other DSS Guardian (Lorrin) and informed her the psych team have spoken with Richelle and then  shared with her the patient's disposition.  Patient is well-known to the emergency room with previous similar presentations.  The group home voiced no concern that patient was a danger to self or others.  They reported that patient had not been suicidal prior to arrival to emergency room.  They reported patient displayed no self-harm behaviors or voiced any thoughts of suicide in the community.  Patient has displayed no self-harm behaviors since being in the emergency room as well.  Patient is already established with outpatient psychiatric services and routinely sees them.  Patient will be discharged to group home which will provide safe, monitored environment.  Group home denies any access to weapons.  Patient denied any homicidal ideations.  Patient denied any auditory or visual hallucinations as well.On current presentation there was no evidence of psychosis or mania and patient did not appear to be responding to internal stimuli. At this time, patient does not appear to be a risk to self or others.  Patient reported being compliant with current medication regimen and denied any noted side effects.  At this time, patient would not benefit from inpatient psychiatric admission.  Patient is psych cleared to continue follow-up with scheduled and establish outpatient psychiatric providers.  Group home is able to provide safe, monitored and routine environment for patient at this time.    Diagnoses:  Active Hospital problems: Active Problems:   * No active  hospital problems. *    Plan   ## Psychiatric Medication Recommendations:  Continue current home medications  ## Medical Decision Making Capacity: Patient has a guardian and has thus been adjudicated incompetent; please involve patients guardian in medical decision making     ## Disposition:--Patient is psych cleared to return to group home  ## Behavioral / Environmental: - No specific recommendations at this time.     ## Safety and  Observation Level:  - Based on my clinical evaluation, I estimate the patient to be at low risk of self harm in the current setting. - At this time, we recommend  routine. This decision is based on my review of the chart including patient's history and current presentation, interview of the patient, mental status examination, and consideration of suicide risk including evaluating suicidal ideation, plan, intent, suicidal or self-harm behaviors, risk factors, and protective factors. This judgment is based on our ability to directly address suicide risk, implement suicide prevention strategies, and develop a safety plan while the patient is in the clinical setting. Please contact our team if there is a concern that risk level has changed.  CSSR Risk Category:C-SSRS RISK CATEGORY: High Risk-patient at chronically elevated risk.  Patient able to display safe behaviors while in the emergency department.  Group home also reports that patient was effectively able to to keep herself safe in the community and displayed no self-harm behaviors prior to arrival to emergency room.  Group home reports that patient often expresses suicidal ideations when wanting to come to the emergency room.  Patient frequently requests to come to the emergency room for a variety of complaints, per group home.  Suicide Risk Assessment: Patient has following modifiable risk factors for suicide: N/A, which we are addressing by utilizing therapeutic communication. Patient has following non-modifiable or demographic risk factors for suicide: psychiatric hospitalization Patient has the following protective factors against suicide: Access to outpatient mental health care and Supportive friends, structured monitored environment provided by the group home  Thank you for this consult request. Recommendations have been communicated to the primary team.  We will sign off at this time.   Zelda Sharps, NP        History of Present Illness   Relevant Aspects of Hospital ED   Information was collected on initial assessment conducted by [facility] tele[facility] providers please reference their note    Psychiatric and Social History  Information was collected on initial assessment conducted by [facility] tele[facility] providers please reference their note  Exam Findings  Physical Exam: Reviewed and agree with the physical exam findings conducted by the medical provider Vital Signs:  Temp:  [98.2 F (36.8 C)-98.5 F (36.9 C)] 98.5 F (36.9 C) (12/05 0916) Pulse Rate:  [82-98] 82 (12/05 0916) Resp:  [18-21] 21 (12/05 0916) BP: (106-121)/(61-84) 106/67 (12/05 0916) SpO2:  [91 %-98 %] 94 % (12/05 0916) Weight:  [140.6 kg] 140.6 kg (12/04 1536) Blood pressure 106/67, pulse 82, temperature 98.5 F (36.9 C), temperature source Oral, resp. rate (!) 21, height 5' 4 (1.626 m), weight (!) 140.6 kg, SpO2 94%. Body mass index is 53.21 kg/m.    Mental Status Exam: General Appearance: Casual  Orientation:  Full (Time, Place, and Person)  Memory:  Fair  Concentration:  Concentration: Fair  Recall:  Fair  Attention  Fair  Eye Contact:  Good  Speech:  Slow  Language:  Fair  Volume:  Normal  Mood: Euthymic  Affect:  Appropriate  Thought Process:  Coherent  Thought Content:  Logical  Suicidal Thoughts:  Yes.  without intent/plan  Homicidal Thoughts:  No  Judgement:  Poor  Insight:  Lacking  Psychomotor Activity:  Normal  Akathisia:  No  Fund of Knowledge:  Poor      Assets:  Games Developer Social Support Transportation  Cognition:  Impaired,  Mild  ADL's:  Intact  AIMS (if indicated):        Other History   These have been pulled in through the EMR, reviewed, and updated if appropriate.  Family History:  The patient's family history is not on file.  Medical History: Past Medical History:  Diagnosis Date   Borderline personality disorder (HCC)    Constipation 05/15/2015   Depression    DMDD  (disruptive mood dysregulation disorder) 05/15/2015   History of seasonal allergies 05/15/2015   Hx of gastroesophageal reflux (GERD) 05/15/2015   Hx of seizure disorder 05/15/2015   Intellectual disability 05/22/2015   PTSD (post-traumatic stress disorder)    Seizures (HCC)    last one in 2015   Suicidal ideation     Surgical History: History reviewed. No pertinent surgical history.   Medications:   Current Facility-Administered Medications:    albuterol  (PROVENTIL ) (2.5 MG/3ML) 0.083% nebulizer solution 3 mL, 3 mL, Inhalation, Q6H PRN, Nicholaus Rolland BRAVO, MD   alum & mag hydroxide-simeth (MAALOX/MYLANTA) 200-200-20 MG/5ML suspension 30 mL, 30 mL, Oral, Q6H PRN, Nicholaus Rolland E, MD   apixaban  (ELIQUIS ) tablet 5 mg, 5 mg, Oral, BID, Nicholaus Rolland E, MD, 5 mg at 12/15/23 1009   benztropine  (COGENTIN ) tablet 1 mg, 1 mg, Oral, BID, Nicholaus Rolland E, MD, 1 mg at 12/15/23 1008   Deutetrabenazine  ER TB24 1 tablet, 1 tablet, Oral, Daily, Nicholaus Rolland BRAVO, MD   folic acid  (FOLVITE ) tablet 1 mg, 1 mg, Oral, Daily, Nicholaus Rolland E, MD, 1 mg at 12/15/23 1009   hydrochlorothiazide  (HYDRODIURIL ) tablet 25 mg, 25 mg, Oral, Daily, Nicholaus Rolland E, MD, 25 mg at 12/15/23 1009   ibuprofen  (ADVIL ) tablet 600 mg, 600 mg, Oral, Q8H PRN, Nicholaus Rolland BRAVO, MD   levETIRAcetam  (KEPPRA ) tablet 500 mg, 500 mg, Oral, BID, Nicholaus Rolland E, MD, 500 mg at 12/15/23 1009   levothyroxine  (SYNTHROID ) tablet 50 mcg, 50 mcg, Oral, QAC breakfast, Nicholaus Rolland E, MD, 50 mcg at 12/15/23 0740   LORazepam  (ATIVAN ) tablet 1 mg, 1 mg, Oral, TID, Nicholaus Rolland E, MD, 1 mg at 12/15/23 1008   metoprolol  succinate (TOPROL -XL) 24 hr tablet 50 mg, 50 mg, Oral, Daily, Nicholaus Rolland E, MD, 50 mg at 12/15/23 1009   ondansetron  (ZOFRAN ) tablet 4 mg, 4 mg, Oral, Q8H PRN, Nicholaus Rolland BRAVO, MD   pantoprazole  (PROTONIX ) EC tablet 40 mg, 40 mg, Oral, Daily, Nicholaus Rolland E, MD, 40 mg at 12/15/23 1009   prazosin  (MINIPRESS ) capsule 2 mg, 2 mg,  Oral, QHS, Nicholaus Rolland E, MD, 2 mg at 12/14/23 2129   Xanomeline-Trospium  Chloride 100-20 MG CAPS 1 capsule, 1 capsule, Oral, Daily, Nicholaus Rolland BRAVO, MD  Current Outpatient Medications:    apixaban  (ELIQUIS ) 5 MG TABS tablet, Take 5 mg by mouth 2 (two) times daily., Disp: , Rfl:    benztropine  (COGENTIN ) 1 MG tablet, Take 1 mg by mouth 2 (two) times daily., Disp: , Rfl:    cloNIDine  (CATAPRES ) 0.1 MG tablet, Take 0.1 mg by mouth 2 (two) times daily., Disp: , Rfl:    divalproex  (DEPAKOTE ) 500 MG DR tablet, Take 1,000 mg by mouth 2 (two) times daily.,  Disp: , Rfl:    escitalopram  (LEXAPRO ) 10 MG tablet, Take 10 mg by mouth daily., Disp: , Rfl:    folic acid  (FOLVITE ) 1 MG tablet, Take 1 mg by mouth daily., Disp: , Rfl:    hydrochlorothiazide  (HYDRODIURIL ) 25 MG tablet, Take 25 mg by mouth daily., Disp: , Rfl:    levETIRAcetam  (KEPPRA ) 500 MG tablet, Take 500 mg by mouth 2 (two) times daily., Disp: , Rfl:    levothyroxine  (SYNTHROID ) 50 MCG tablet, Take 50 mcg by mouth daily before breakfast., Disp: , Rfl:    loperamide  (IMODIUM  A-D) 2 MG tablet, Take 1 tablet (2 mg total) by mouth 4 (four) times daily as needed for diarrhea or loose stools., Disp: 30 tablet, Rfl: 0   LORazepam  (ATIVAN ) 1 MG tablet, Take 1 mg by mouth 3 (three) times daily., Disp: , Rfl:    meclizine  (ANTIVERT ) 25 MG tablet, Take 25 mg by mouth 3 (three) times daily as needed for nausea., Disp: , Rfl:    metoprolol  succinate (TOPROL -XL) 50 MG 24 hr tablet, Take 50 mg by mouth daily., Disp: , Rfl:    Multiple Vitamins-Minerals (MULTIVITAMIN ADULTS) TABS, Take 1 tablet by mouth daily., Disp: , Rfl:    omeprazole (PRILOSEC) 40 MG capsule, Take 40 mg by mouth daily., Disp: , Rfl:    prazosin  (MINIPRESS ) 2 MG capsule, Take 2 mg by mouth at bedtime., Disp: , Rfl:    QUEtiapine (SEROQUEL XR) 50 MG TB24 24 hr tablet, Take 50 mg by mouth at bedtime., Disp: , Rfl:    trazodone  (DESYREL ) 300 MG tablet, Take 300 mg by mouth at bedtime.,  Disp: , Rfl:    UZEDY  100 MG/0.28ML syringe, Inject 100 mg into the skin every 30 (thirty) days., Disp: , Rfl:    VENTOLIN  HFA 108 (90 Base) MCG/ACT inhaler, Inhale 2 puffs into the lungs every 6 (six) hours as needed for wheezing or shortness of breath., Disp: , Rfl:    Xanomeline-Trospium  Chloride 125-30 MG CAPS, Take 1 capsule by mouth 2 (two) times daily., Disp: , Rfl:   Allergies: Allergies  Allergen Reactions   Ritalin [Methylphenidate Hcl] Other (See Comments)    seizures   Abilify [Aripiprazole] Other (See Comments)    Shaking or tremors   Lithium      Zelda Sharps, NP This note was created using Scientist, clinical (histocompatibility and immunogenetics). Please excuse any inadvertent transcription errors. Case was discussed with supervising physician Dr. Jadapalle who is agreeable with current plan.

## 2023-12-15 NOTE — Consult Note (Signed)
 [name]  Patient Name: Sherry Bryan MRN: [id number] DOB: 31-Mar-1995 DATE OF Consult: 12/15/2023  PRIMARY PSYCHIATRIC DIAGNOSES Adjustment disorder with depressed and anxious mood; Unspecified intellectual disability by present history  Based on my current evaluation and assessment of the patient, she is a 28 y.o. with behavioral escalation characterized by unpremeditated, impulsive suicidal threats with various plans in the context of acute stressor (disappointment with parents not visiting). However, despite patient has been in the emergency department and apart from the group home environment, patient continues to voice suicidal ideations. Throughout observation in the emergency department, per primary team, the patient has been behaviorally appropriate, compliant with cares, and has not required emergent psychotropic medications or seclusion and/or restraint. It appears that at baseline patient does have chronic suicidal ideations per chart documentation. Given the situational nature of patient's suicidal ideations, obtaining collateral is critical to determine whether patient's living environment may be appropriately safety proofed to prevent impulsive self-harming behaviors in the context of acute stressors.  The patient's presentation is consistent with Adjustment disorder with depressed and anxious mood; Unspecified intellectual disability by present history. Would defer psychiatric disposition until further collateral and safety and treatment planning may be performed with patient and group home staff.   RECOMMENDATIONS  Inpatient psychiatric admission recommended?   Would defer psychiatric disposition until further collateral and safety and treatment planning may be performed with patient and group home staff.  Medication recommendations:  Risks, benefits, side effects and alternatives to treatments reviewed:  -Continue home psychotropic regimen (recommend  performing a medication reconciliation with patient's pharmacy to ascertain accuracy of reported regimen)  As needed medications to manage patient's acute symptoms while in hospital care: QTc is 493 ms as of 08/2023 -Maximize utilization of verbal de-escalation techniques, if attempts are unsuccessful and patient poses a threat to self and others: Consider lorazepam  1 mg to 2 mg PO/IM every 6 hours as needed for severe agitation. Would offer patient the option of taking PO medication first, but if patient refuses then may administer IM medication as a last resort. Would avoid antipsychotics for acute agitation given patient with a history of prolonged QTc and this class of medication may further lengthen QTc.   Non-Medication recommendations:  -Recommend communicating with patient's group home supervisor and guardian during regular business hours to determine whether patient's environment may be appropriately secured and monitored as she processes through the disappointment of not seeing parents during holiday  -Recommend resources for outpatient psychotherapy so that patient may process their emotions and learn more adaptive coping skills in a therapeutic environment if not already established -Recommend continued engagement with outpatient psychiatric provider for psychotropic medication management  -Recommend regular follow-up with primary care provider; consider outpatient workup for mood dysregulation include vitamin B12, thyroid and vitamin D  studies to name a few -Safety planning to increase patient's supervision and to include restricting patient's access to sharps, firearms, medications, ligatures or any object that may be weaponized; performing routine body checks to monitor patient for any self-injurious cutting behaviors; and strict return precautions to the ED if patient is at imminent risk to self or others in the future   Observation recommendations: If agitated, recommend 1:1  observation  Treatment team members, and family members if applicable, with whom risk formulation and management, and other related findings, were reviewed include the following: primary team   Follow-Up Telepsychiatry C/L services: Will continue to follow    Total time spent in this encounter was 60 minutes with greater than  50% of time spent in counseling and coordination of care.   Thank you for involving us  in the care of this patient. If you have any additional questions or concerns, please call [phone] and ask for me or the provider on-call.  TELEPSYCHIATRY ATTESTATION & CONSENT  As the provider for this telehealth consult, I attest that I verified the patient's identity using two separate identifiers, introduced myself to the patient, provided my credentials, disclosed my location, and performed this encounter via a HIPAA-compliant, real-time, face-to-face, two-way, interactive audio and video platform and with the full consent and agreement of the patient (or guardian as applicable.)  Patient physical location: [facility] Emergency Department at [facility] . Telehealth provider physical location: home office in state of [place].  Video start time: 2245 (Central Time) Video end time: 2300 (Central Time)  IDENTIFYING DATA  Sherry Bryan is a 28 y.o. year-old female for whom a psychiatric consultation has been ordered by the primary provider. The patient was identified using two separate identifiers.  CHIEF COMPLAINT/REASON FOR CONSULT  Behavioral health concerns   HISTORY OF PRESENT ILLNESS (HPI)  I evaluated the patient today face-to-face via secure, HIPAA-compliant telepsychiatric connection, and at the request of the primary treatment team. The reason for the telepsychiatric consultation is that the patient is a 28 year old female with a documented history of unspecified intellectual disability and major depressive disorder who presents for psychiatric evaluation given  suicidal ideations. Primary team is seeking psychotropic medication recommendations, safety evaluation to determine appropriateness for more intensive psychiatric services and diagnostic clarity as to the patient's presentation.   During one-on-one evaluation with this provider, patient was alert and oriented to self and generally to location and situation. The patient did not appear to be overtly inappropriately internally preoccupied; patient's thought process was concrete. Patient asserted that upon learning that her parents are not visiting during Christmas, she began feeling more depressed. She describes having suicidal ideations with plan to "jump off a bridge". Patient reports that she does not know how to overcome this given that she cannot change that parents will not visit her. Patient was unable to contract for safety and was advocating for inpatient admission.   Multiple attempts were made to call patient's guardian and group home supervisor as listed in the medical chart. However, unable to contact either. \  PAST PSYCHIATRIC HISTORY  Inpatient psychiatric treatment: per chart documentation, yes  Outpatient mental health treatment: per patient, she is established with provider that is managing her psychotropics  Guardianship: per chart documentation, patient does have a guardian  Suicide attempts: per patient, denies  Trauma history: patient did not assert further current concern for trauma or exploitation beyond described in the HPI   Otherwise as per HPI above.  PAST MEDICAL HISTORY  Past Medical History:  Diagnosis Date   Borderline personality disorder (HCC)    Constipation 05/15/2015   Depression    DMDD (disruptive mood dysregulation disorder) 05/15/2015   History of seasonal allergies 05/15/2015   Hx of gastroesophageal reflux (GERD) 05/15/2015   Hx of seizure disorder 05/15/2015   Intellectual disability 05/22/2015   PTSD (post-traumatic stress disorder)    Seizures (HCC)    last  one in 2015   Suicidal ideation      HOME MEDICATIONS  Facility Ordered Medications  Medication   ibuprofen  (ADVIL ) tablet 600 mg   ondansetron  (ZOFRAN ) tablet 4 mg   alum & mag hydroxide-simeth (MAALOX/MYLANTA) 200-200-20 MG/5ML suspension 30 mL   apixaban  (ELIQUIS ) tablet  5 mg   Deutetrabenazine  ER TB24 1 tablet   benztropine  (COGENTIN ) tablet 1 mg   Xanomeline-Trospium  Chloride 100-20 MG CAPS 1 capsule   folic acid  (FOLVITE ) tablet 1 mg   hydrochlorothiazide  (HYDRODIURIL ) tablet 25 mg   levETIRAcetam  (KEPPRA ) tablet 500 mg   levothyroxine  (SYNTHROID ) tablet 50 mcg   LORazepam  (ATIVAN ) tablet 1 mg   metoprolol  succinate (TOPROL -XL) 24 hr tablet 50 mg   pantoprazole  (PROTONIX ) EC tablet 40 mg   prazosin  (MINIPRESS ) capsule 2 mg   albuterol  (PROVENTIL ) (2.5 MG/3ML) 0.083% nebulizer solution 3 mL   PTA Medications  Medication Sig   meclizine  (ANTIVERT ) 25 MG tablet Take 25 mg by mouth 3 (three) times daily as needed for nausea.   Multiple Vitamins-Minerals (MULTIVITAMIN ADULTS) TABS Take 1 tablet by mouth daily.   levETIRAcetam  (KEPPRA ) 500 MG tablet Take 500 mg by mouth 2 (two) times daily.   VENTOLIN  HFA 108 (90 Base) MCG/ACT inhaler Inhale 2 puffs into the lungs every 6 (six) hours as needed for wheezing or shortness of breath.   omeprazole (PRILOSEC) 40 MG capsule Take 40 mg by mouth daily.   folic acid  (FOLVITE ) 1 MG tablet Take 1 mg by mouth daily.   levothyroxine  (SYNTHROID ) 50 MCG tablet Take 50 mcg by mouth daily before breakfast.   apixaban  (ELIQUIS ) 5 MG TABS tablet Take 5 mg by mouth 2 (two) times daily.   hydrochlorothiazide  (HYDRODIURIL ) 25 MG tablet Take 25 mg by mouth daily.   escitalopram  (LEXAPRO ) 10 MG tablet Take 10 mg by mouth daily.   LORazepam  (ATIVAN ) 1 MG tablet Take 1 mg by mouth 3 (three) times daily.   metoprolol  succinate (TOPROL -XL) 50 MG 24 hr tablet Take 50 mg by mouth daily.   prazosin  (MINIPRESS ) 2 MG capsule Take 2 mg by mouth at bedtime.    trazodone  (DESYREL ) 300 MG tablet Take 300 mg by mouth at bedtime.   loperamide  (IMODIUM  A-D) 2 MG tablet Take 1 tablet (2 mg total) by mouth 4 (four) times daily as needed for diarrhea or loose stools.   Xanomeline-Trospium  Chloride 125-30 MG CAPS Take 1 capsule by mouth 2 (two) times daily.   benztropine  (COGENTIN ) 1 MG tablet Take 1 mg by mouth 2 (two) times daily.   cloNIDine  (CATAPRES ) 0.1 MG tablet Take 0.1 mg by mouth 2 (two) times daily.   QUEtiapine (SEROQUEL XR) 50 MG TB24 24 hr tablet Take 50 mg by mouth at bedtime.   divalproex  (DEPAKOTE ) 500 MG DR tablet Take 1,000 mg by mouth 2 (two) times daily.   UZEDY  100 MG/0.28ML syringe Inject 100 mg into the skin every 30 (thirty) days.    ALLERGIES  Allergies  Allergen Reactions   Ritalin [Methylphenidate Hcl] Other (See Comments)    seizures   Abilify [Aripiprazole] Other (See Comments)    Shaking or tremors   Lithium      SOCIAL & SUBSTANCE USE HISTORY  Social History   Socioeconomic History   Marital status: Single    Spouse name: Not on file   Number of children: Not on file   Years of education: Not on file   Highest education level: Not on file  Occupational History   Not on file  Tobacco Use   Smoking status: Former    Types: Cigarettes   Smokeless tobacco: Never  Vaping Use   Vaping status: Every Day  Substance and Sexual Activity   Alcohol use: No   Drug use: No   Sexual activity: Never  Other Topics  Concern   Not on file  Social History Narrative   Not on file   Social Drivers of Health   Financial Resource Strain: Not on file  Food Insecurity: Not on file  Transportation Needs: Not on file  Physical Activity: Not on file  Stress: Not on file  Social Connections: Not on file   Social History   Tobacco Use  Smoking Status Former   Types: Cigarettes  Smokeless Tobacco Never   Social History   Substance and Sexual Activity  Alcohol Use No   Social History   Substance and Sexual Activity   Drug Use No    Additional pertinent information none disclosed .  FAMILY HISTORY  History reviewed. No pertinent family history. Family Psychiatric History (if known):  none disclosed   MENTAL STATUS EXAM (MSE)  Mental Status Exam: General Appearance: Fairly Groomed  Orientation:  Full (Time, Place, and Person)  Memory:  Immediate;   Fair Recent;   Fair Remote;   Fair  Concentration:  Concentration: Fair and Attention Span: Fair  Recall:  Fair  Attention  Fair  Eye Contact:  Fair  Speech:  Normal Rate  Language:  Fair  Volume:  Normal  Mood: not good  Affect:  Congruent  Thought Process:  Goal Directed  Thought Content:  Rumination on absence of parents on holiday  Suicidal Thoughts:  Yes with stated plans   Homicidal Thoughts:  No  Judgement:  Fair  Insight:  Fair  Psychomotor Activity:  Normal  Akathisia:  No  Fund of Knowledge:  Fair    Assets:  Desire for Improvement  Cognition:  Impaired,  Mild  ADL's:  Intact  AIMS (if indicated):       VITALS  Blood pressure 121/64, pulse 90, temperature 98.4 F (36.9 C), temperature source Oral, resp. rate 18, height 5' 4 (1.626 m), weight (!) 140.6 kg, SpO2 93%.  LABS  Admission on 12/14/2023  Component Date Value Ref Range Status   Sodium 12/14/2023 136  135 - 145 mmol/L Final   Potassium 12/14/2023 3.7  3.5 - 5.1 mmol/L Final   Chloride 12/14/2023 98  98 - 111 mmol/L Final   CO2 12/14/2023 27  22 - 32 mmol/L Final   Glucose, Bld 12/14/2023 151 (H)  70 - 99 mg/dL Final   Glucose reference range applies only to samples taken after fasting for at least 8 hours.   BUN 12/14/2023 11  6 - 20 mg/dL Final   Creatinine, Ser 12/14/2023 0.95  0.44 - 1.00 mg/dL Final   Calcium 87/95/7974 9.1  8.9 - 10.3 mg/dL Final   Total Protein 87/95/7974 7.4  6.5 - 8.1 g/dL Final   Albumin 87/95/7974 3.4 (L)  3.5 - 5.0 g/dL Final   AST 87/95/7974 15  15 - 41 U/L Final   ALT 12/14/2023 8  0 - 44 U/L Final   Alkaline Phosphatase  12/14/2023 64  38 - 126 U/L Final   Total Bilirubin 12/14/2023 0.3  0.0 - 1.2 mg/dL Final   GFR, Estimated 12/14/2023 >60  >60 mL/min Final   Comment: (NOTE) Calculated using the CKD-EPI Creatinine Equation (2021)    Anion gap 12/14/2023 11  5 - 15 Final   Performed at [facility], [address] [place], [place] [address]   Alcohol, Ethyl (B) 12/14/2023 <15  <15 mg/dL Final   Comment: (NOTE) For medical purposes only. Performed at [facility], [address] [place], [place] [address]    WBC 12/14/2023  9.9  4.0 - 10.5 K/uL Final   RBC 12/14/2023 5.18 (H)  3.87 - 5.11 MIL/uL Final   Hemoglobin 12/14/2023 12.6  12.0 - 15.0 g/dL Final   HCT 87/95/7974 40.6  36.0 - 46.0 % Final   MCV 12/14/2023 78.4 (L)  80.0 - 100.0 fL Final   MCH 12/14/2023 24.3 (L)  26.0 - 34.0 pg Final   MCHC 12/14/2023 31.0  30.0 - 36.0 g/dL Final   RDW 87/95/7974 21.9 (H)  11.5 - 15.5 % Final   Platelets 12/14/2023 196  150 - 400 K/uL Final   nRBC 12/14/2023 0.5 (H)  0.0 - 0.2 % Final   Performed at [facility], [address] [place], [place] [address]   Opiates 12/14/2023 NEGATIVE  NEGATIVE Final   Cocaine 12/14/2023 NEGATIVE  NEGATIVE Final   Benzodiazepines 12/14/2023 NEGATIVE  NEGATIVE Final   Amphetamines 12/14/2023 NEGATIVE  NEGATIVE Final   Tetrahydrocannabinol 12/14/2023 NEGATIVE  NEGATIVE Final   Barbiturates 12/14/2023 NEGATIVE  NEGATIVE Final   Methadone Scn, Ur 12/14/2023 NEGATIVE  NEGATIVE Final   Fentanyl 12/14/2023 NEGATIVE  NEGATIVE Final   Comment: (NOTE) Drug screen is for Medical Purposes only. Positive results are preliminary only. If confirmation is needed, notify lab within 5 days.  Drug Class                 Cutoff (ng/mL) Amphetamine and metabolites 1000 Barbiturate and metabolites 200 Benzodiazepine              200 Opiates and metabolites     300 Cocaine and metabolites     300 THC                         50 Fentanyl                     5 Methadone                   300  Trazodone  is metabolized in vivo to several metabolites,  including pharmacologically active m-CPP, which is excreted in the  urine.  Immunoassay screens for amphetamines and MDMA have potential  cross-reactivity with these compounds and may provide false positive  result.  Performed at [facility], [address] [place], [place] [address]    Preg Test, Ur 12/14/2023 NEGATIVE  NEGATIVE Final   Comment:        THE SENSITIVITY OF THIS METHODOLOGY IS >20 mIU/mL. Performed at [facility], [address] [place], [place] [address]     PSYCHIATRIC REVIEW OF SYSTEMS (ROS)  ROS: Notable for the following relevant positive findings: Review of Systems  Psychiatric/Behavioral:  Positive for depression and suicidal ideas. Negative for hallucinations, memory loss and substance abuse. The patient is nervous/anxious. The patient does not have insomnia.     Additional findings:      Musculoskeletal: No abnormal movements observed      Gait & Station: Laying/Sitting      Pain Screening: Denies      Nutrition & Dental Concerns: none disclosed   RISK FORMULATION/ASSESSMENT  Is the patient experiencing any suicidal or homicidal ideations: Yes       Explain if yes: unpremeditated, impulsive suicidal threats with various plans in the context of acute stressor (disappointment with parents not visiting) Protective factors considered for safety management: Current care in a highly monitored health care setting  Risk factors/concerns considered for safety management:  Depression Physical illness/chronic  pain Impulsivity Barriers to accessing treatment Unmarried  Is there a safety management plan with the patient and treatment team to minimize risk factors and promote protective factors: Yes           Explain: continued observation until further collateral may be obtained   Is crisis care placement or psychiatric hospitalization  recommended: Would defer psychiatric disposition until further collateral and safety and treatment planning may be performed with patient and group home staff.     Based on my current evaluation and risk assessment, patient is determined at this time to be at:  Moderate Risk  *RISK ASSESSMENT Risk assessment is a dynamic process; it is possible that this patient's condition, and risk level, may change. This should be re-evaluated and managed over time as appropriate. Please re-consult psychiatric consult services if additional assistance is needed in terms of risk assessment and management. If your team decides to discharge this patient, please advise the patient how to best access emergency psychiatric services, or to call 911, if their condition worsens or they feel unsafe in any way.   Charlene Buba, MD Telepsychiatry Consult Services

## 2023-12-15 NOTE — Discharge Instructions (Signed)
You have been seen in the emergency department for a  psychiatric concern. You have been evaluated both medically as well as psychiatrically. Please follow-up with your outpatient resources provided. Return to the emergency department for any worsening symptoms, or any thoughts of hurting yourself or anyone else so that we may attempt to help you. 

## 2023-12-16 ENCOUNTER — Inpatient Hospital Stay
Admission: EM | Admit: 2023-12-16 | Discharge: 2024-01-05 | DRG: 193 | Disposition: A | Discharge status: Home / Self Care | Payer: MEDICAID | Attending: Internal Medicine | Admitting: Internal Medicine

## 2023-12-16 ENCOUNTER — Emergency Department: Payer: MEDICAID

## 2023-12-16 ENCOUNTER — Other Ambulatory Visit: Payer: Self-pay

## 2023-12-16 DIAGNOSIS — J40 Bronchitis, not specified as acute or chronic: Secondary | ICD-10-CM

## 2023-12-16 DIAGNOSIS — I959 Hypotension, unspecified: Secondary | ICD-10-CM | POA: Diagnosis present

## 2023-12-16 DIAGNOSIS — F4310 Post-traumatic stress disorder, unspecified: Secondary | ICD-10-CM | POA: Diagnosis present

## 2023-12-16 DIAGNOSIS — Z23 Encounter for immunization: Secondary | ICD-10-CM

## 2023-12-16 DIAGNOSIS — A419 Sepsis, unspecified organism: Secondary | ICD-10-CM

## 2023-12-16 DIAGNOSIS — R45851 Suicidal ideations: Secondary | ICD-10-CM | POA: Diagnosis not present

## 2023-12-16 DIAGNOSIS — Z1152 Encounter for screening for COVID-19: Secondary | ICD-10-CM

## 2023-12-16 DIAGNOSIS — E039 Hypothyroidism, unspecified: Secondary | ICD-10-CM | POA: Diagnosis present

## 2023-12-16 DIAGNOSIS — F603 Borderline personality disorder: Secondary | ICD-10-CM | POA: Diagnosis present

## 2023-12-16 DIAGNOSIS — Z560 Unemployment, unspecified: Secondary | ICD-10-CM

## 2023-12-16 DIAGNOSIS — J159 Unspecified bacterial pneumonia: Secondary | ICD-10-CM | POA: Diagnosis present

## 2023-12-16 DIAGNOSIS — E66813 Obesity, class 3: Secondary | ICD-10-CM | POA: Diagnosis present

## 2023-12-16 DIAGNOSIS — E8729 Other acidosis: Secondary | ICD-10-CM | POA: Diagnosis present

## 2023-12-16 DIAGNOSIS — Z7901 Long term (current) use of anticoagulants: Secondary | ICD-10-CM

## 2023-12-16 DIAGNOSIS — R531 Weakness: Secondary | ICD-10-CM

## 2023-12-16 DIAGNOSIS — I10 Essential (primary) hypertension: Secondary | ICD-10-CM | POA: Diagnosis present

## 2023-12-16 DIAGNOSIS — R0902 Hypoxemia: Secondary | ICD-10-CM

## 2023-12-16 DIAGNOSIS — F17290 Nicotine dependence, other tobacco product, uncomplicated: Secondary | ICD-10-CM | POA: Diagnosis present

## 2023-12-16 DIAGNOSIS — F4323 Adjustment disorder with mixed anxiety and depressed mood: Secondary | ICD-10-CM | POA: Diagnosis present

## 2023-12-16 DIAGNOSIS — Z79890 Hormone replacement therapy (postmenopausal): Secondary | ICD-10-CM

## 2023-12-16 DIAGNOSIS — E662 Morbid (severe) obesity with alveolar hypoventilation: Secondary | ICD-10-CM | POA: Diagnosis present

## 2023-12-16 DIAGNOSIS — Z79899 Other long term (current) drug therapy: Secondary | ICD-10-CM

## 2023-12-16 DIAGNOSIS — J9622 Acute and chronic respiratory failure with hypercapnia: Secondary | ICD-10-CM | POA: Diagnosis present

## 2023-12-16 DIAGNOSIS — Z888 Allergy status to other drugs, medicaments and biological substances status: Secondary | ICD-10-CM

## 2023-12-16 DIAGNOSIS — J189 Pneumonia, unspecified organism: Principal | ICD-10-CM

## 2023-12-16 DIAGNOSIS — Z6841 Body Mass Index (BMI) 40.0 and over, adult: Secondary | ICD-10-CM

## 2023-12-16 DIAGNOSIS — J9621 Acute and chronic respiratory failure with hypoxia: Secondary | ICD-10-CM | POA: Diagnosis present

## 2023-12-16 DIAGNOSIS — G40909 Epilepsy, unspecified, not intractable, without status epilepticus: Secondary | ICD-10-CM | POA: Diagnosis present

## 2023-12-16 DIAGNOSIS — J1289 Other viral pneumonia: Principal | ICD-10-CM | POA: Diagnosis present

## 2023-12-16 DIAGNOSIS — D509 Iron deficiency anemia, unspecified: Secondary | ICD-10-CM | POA: Diagnosis present

## 2023-12-16 DIAGNOSIS — F32A Depression, unspecified: Secondary | ICD-10-CM | POA: Diagnosis present

## 2023-12-16 DIAGNOSIS — Z8669 Personal history of other diseases of the nervous system and sense organs: Secondary | ICD-10-CM

## 2023-12-16 DIAGNOSIS — K219 Gastro-esophageal reflux disease without esophagitis: Secondary | ICD-10-CM | POA: Diagnosis present

## 2023-12-16 DIAGNOSIS — Z86718 Personal history of other venous thrombosis and embolism: Secondary | ICD-10-CM

## 2023-12-16 DIAGNOSIS — F79 Unspecified intellectual disabilities: Secondary | ICD-10-CM | POA: Diagnosis present

## 2023-12-16 DIAGNOSIS — B348 Other viral infections of unspecified site: Secondary | ICD-10-CM

## 2023-12-16 DIAGNOSIS — F3481 Disruptive mood dysregulation disorder: Secondary | ICD-10-CM | POA: Diagnosis present

## 2023-12-16 DIAGNOSIS — B9789 Other viral agents as the cause of diseases classified elsewhere: Secondary | ICD-10-CM | POA: Diagnosis present

## 2023-12-16 DIAGNOSIS — Z8719 Personal history of other diseases of the digestive system: Secondary | ICD-10-CM

## 2023-12-16 DIAGNOSIS — F259 Schizoaffective disorder, unspecified: Secondary | ICD-10-CM | POA: Diagnosis present

## 2023-12-16 DIAGNOSIS — J9601 Acute respiratory failure with hypoxia: Secondary | ICD-10-CM | POA: Insufficient documentation

## 2023-12-16 LAB — BASIC METABOLIC PANEL WITH GFR
Anion gap: 10 (ref 5–15)
BUN: 9 mg/dL (ref 6–20)
CO2: 30 mmol/L (ref 22–32)
Calcium: 8.8 mg/dL — ABNORMAL LOW (ref 8.9–10.3)
Chloride: 99 mmol/L (ref 98–111)
Creatinine, Ser: 0.92 mg/dL (ref 0.44–1.00)
GFR, Estimated: >60 mL/min (ref >60)
Glucose, Bld: 125 mg/dL — ABNORMAL HIGH (ref 70–99)
Potassium: 3.5 mmol/L (ref 3.5–5.1)
Sodium: 138 mmol/L (ref 135–145)

## 2023-12-16 LAB — URINE DRUG SCREEN
Amphetamines: NEGATIVE
Barbiturates: NEGATIVE
Benzodiazepines: POSITIVE — AB
Cocaine: NEGATIVE
Fentanyl: NEGATIVE
Methadone Scn, Ur: NEGATIVE
Opiates: NEGATIVE
Tetrahydrocannabinol: NEGATIVE

## 2023-12-16 LAB — RESP PANEL BY RT-PCR (RSV, FLU A&B, COVID)  RVPGX2
Influenza A by PCR: NEGATIVE
Influenza B by PCR: NEGATIVE
Resp Syncytial Virus by PCR: NEGATIVE
SARS Coronavirus 2 by RT PCR: NEGATIVE

## 2023-12-16 LAB — CBC
HCT: 36.9 % (ref 36.0–46.0)
Hemoglobin: 11.2 g/dL — ABNORMAL LOW (ref 12.0–15.0)
MCH: 24.6 pg — ABNORMAL LOW (ref 26.0–34.0)
MCHC: 30.4 g/dL (ref 30.0–36.0)
MCV: 80.9 fL (ref 80.0–100.0)
Platelets: 170 K/uL (ref 150–400)
RBC: 4.56 MIL/uL (ref 3.87–5.11)
RDW: 22.4 % — ABNORMAL HIGH (ref 11.5–15.5)
WBC: 10.9 K/uL — ABNORMAL HIGH (ref 4.0–10.5)
nRBC: 0.6 % — ABNORMAL HIGH (ref 0.0–0.2)

## 2023-12-16 LAB — ETHANOL: Alcohol, Ethyl (B): <15 mg/dL (ref <15)

## 2023-12-16 LAB — VALPROIC ACID LEVEL: Valproic Acid Lvl: 71 ug/mL (ref 50–100)

## 2023-12-16 LAB — TROPONIN T, HIGH SENSITIVITY: Troponin T High Sensitivity: <15 ng/L (ref 0–19)

## 2023-12-16 LAB — POC URINE PREG, ED: Preg Test, Ur: NEGATIVE

## 2023-12-16 MED ORDER — APIXABAN 5 MG PO TABS
5.0000 mg | ORAL_TABLET | Freq: Two times a day (BID) | ORAL | Status: DC
  Administered 2023-12-16 – 2024-01-05 (×40): 5 mg via ORAL
  Filled 2023-12-16 (×40): qty 1

## 2023-12-16 MED ORDER — ESCITALOPRAM OXALATE 10 MG PO TABS
10.0000 mg | ORAL_TABLET | Freq: Every day | ORAL | Status: DC
  Administered 2023-12-16 – 2024-01-05 (×21): 10 mg via ORAL
  Filled 2023-12-16 (×21): qty 1

## 2023-12-16 MED ORDER — TRAZODONE HCL 100 MG PO TABS
300.0000 mg | ORAL_TABLET | Freq: Every day | ORAL | Status: DC
  Administered 2023-12-16 – 2023-12-20 (×4): 300 mg via ORAL
  Filled 2023-12-16 (×5): qty 3

## 2023-12-16 MED ORDER — CLONIDINE HCL 0.1 MG PO TABS
0.1000 mg | ORAL_TABLET | Freq: Two times a day (BID) | ORAL | Status: DC
  Administered 2023-12-16 – 2023-12-17 (×3): 0.1 mg via ORAL
  Filled 2023-12-16 (×3): qty 1

## 2023-12-16 MED ORDER — BENZTROPINE MESYLATE 1 MG PO TABS
1.0000 mg | ORAL_TABLET | Freq: Two times a day (BID) | ORAL | Status: DC
  Administered 2023-12-16 – 2023-12-25 (×20): 1 mg via ORAL
  Filled 2023-12-16 (×21): qty 1

## 2023-12-16 MED ORDER — LORAZEPAM 1 MG PO TABS
1.0000 mg | ORAL_TABLET | Freq: Three times a day (TID) | ORAL | Status: DC
  Administered 2023-12-16 – 2023-12-22 (×12): 1 mg via ORAL
  Filled 2023-12-16 (×14): qty 1

## 2023-12-16 MED ORDER — XANOMELINE-TROSPIUM CHLORIDE 125-30 MG PO CAPS: 1.0000 | ORAL_CAPSULE | Freq: Two times a day (BID) | ORAL | Status: DC

## 2023-12-16 MED ORDER — LEVETIRACETAM 500 MG PO TABS
500.0000 mg | ORAL_TABLET | Freq: Two times a day (BID) | ORAL | Status: DC
  Administered 2023-12-16 – 2024-01-05 (×40): 500 mg via ORAL
  Filled 2023-12-16 (×41): qty 1

## 2023-12-16 MED ORDER — QUETIAPINE FUMARATE ER 50 MG PO TB24
50.0000 mg | ORAL_TABLET | Freq: Every day | ORAL | Status: DC
  Administered 2023-12-16 – 2023-12-21 (×6): 50 mg via ORAL
  Filled 2023-12-16 (×6): qty 1

## 2023-12-16 MED ORDER — HYDROCHLOROTHIAZIDE 25 MG PO TABS
25.0000 mg | ORAL_TABLET | Freq: Every day | ORAL | Status: DC
  Administered 2023-12-16: 25 mg via ORAL
  Filled 2023-12-16: qty 1

## 2023-12-16 MED ORDER — DIVALPROEX SODIUM 500 MG PO DR TAB
1000.0000 mg | DELAYED_RELEASE_TABLET | Freq: Two times a day (BID) | ORAL | Status: DC
  Administered 2023-12-16 – 2024-01-05 (×40): 1000 mg via ORAL
  Filled 2023-12-16 (×41): qty 2

## 2023-12-16 MED ORDER — FOLIC ACID 1 MG PO TABS
1.0000 mg | ORAL_TABLET | Freq: Every day | ORAL | Status: DC
  Administered 2023-12-16 – 2024-01-05 (×21): 1 mg via ORAL
  Filled 2023-12-16 (×21): qty 1

## 2023-12-16 MED ORDER — METOPROLOL SUCCINATE ER 50 MG PO TB24
50.0000 mg | ORAL_TABLET | Freq: Every day | ORAL | Status: DC
  Administered 2023-12-16: 50 mg via ORAL
  Filled 2023-12-16: qty 1

## 2023-12-16 MED ORDER — PRAZOSIN HCL 2 MG PO CAPS
2.0000 mg | ORAL_CAPSULE | Freq: Every day | ORAL | Status: DC
  Administered 2023-12-16: 2 mg via ORAL
  Filled 2023-12-16: qty 1

## 2023-12-16 MED ORDER — LEVOTHYROXINE SODIUM 50 MCG PO TABS
50.0000 ug | ORAL_TABLET | Freq: Every day | ORAL | Status: DC
  Administered 2023-12-17 – 2023-12-18 (×2): 50 ug via ORAL
  Filled 2023-12-16 (×2): qty 1

## 2023-12-16 MED ORDER — PANTOPRAZOLE SODIUM 40 MG PO TBEC
80.0000 mg | DELAYED_RELEASE_TABLET | Freq: Every day | ORAL | Status: DC
  Administered 2023-12-16 – 2024-01-05 (×21): 80 mg via ORAL
  Filled 2023-12-16 (×22): qty 2

## 2023-12-16 NOTE — ED Notes (Signed)
 Dinner tray provided to pt

## 2023-12-16 NOTE — ED Notes (Signed)
 Lab called for blood draw.

## 2023-12-16 NOTE — BH Assessment (Signed)
 Comprehensive Clinical Assessment (CCA) Screening, Triage and Referral Note  12/16/2023 Finesse Fielder [id number]  Chief Complaint:  Chief Complaint  Patient presents with   Suicidal   Visit Diagnosis: MR/IDD  Sherry Bryan is 28 year old female who presents to the ER due to "my thoughts got worse." She states she having thoughts of ending her life. However, patient is well known to the ER for similar presentation. She was recently discharged less then 24 hours for same ER visit. Patient's group home and guardian both are aware of her behaviors and have tried to reduce the amount of times she coms to the ER.  Patient Reported Information How did you hear about us ? Self  What Is the Reason for Your Visit/Call Today? Patient states my thoughts got worse  How Long Has This Been Causing You Problems? 1-6 months  What Do You Feel Would Help You the Most Today? Treatment for Depression or other mood problem   Have You Recently Had Any Thoughts About Hurting Yourself? No  Are You Planning to Commit Suicide/Harm Yourself At This time? No   Have you Recently Had Thoughts About Hurting Someone [place]? No  Are You Planning to Harm Someone at This Time? No  Explanation: Pt endorses having thoughts of suicide with a plan to cut herself.   Have You Used Any Alcohol or Drugs in the Past 24 Hours? No  How Long Ago Did You Use Drugs or Alcohol? No data recorded What Did You Use and How Much? No data recorded  Do You Currently Have a Therapist/Psychiatrist? Yes  Name of Therapist/Psychiatrist: via group home   Have You Been Recently Discharged From Any Office Practice or Programs? No  Explanation of Discharge From Practice/Program: N/A    CCA Screening Triage Referral Assessment Type of Contact: Face-to-Face  Telemedicine Service Delivery:   Is this Initial or Reassessment?   Date Telepsych consult ordered in [facility]:    Time Telepsych consult ordered in [facility]:    Location of  Assessment: [facility] ED  Provider Location: [facility] ED    Collateral Involvement: None provided   Does Patient Have a Court Appointed Legal Guardian? No data recorded Name and Contact of Legal Guardian: No data recorded If Minor and Not Living with Parent(s), Who has Custody? N/A  Is CPS involved or ever been involved? Never  Is APS involved or ever been involved? Never   Patient Determined To Be At Risk for Harm To Self or Others Based on Review of Patient Reported Information or Presenting Complaint? No  Method: Plan without intent  Availability of Means: No access or NA  Intent: Vague intent or NA  Notification Required: No need or identified person  Additional Information for Danger to Others Potential: Previous attempts  Additional Comments for Danger to Others Potential: n/a  Are There Guns or Other Weapons in Your Home? No  Types of Guns/Weapons: n/a  Are These Weapons Safely Secured?                            No  Who Could Verify You Are Able To Have These Secured: n/a  Do You Have any Outstanding Charges, Pending Court Dates, Parole/Probation? None Reported  Contacted To Inform of Risk of Harm To Self or Others: -- (n/a)   Does Patient Present under Involuntary Commitment? No   [place] of Residence: [place]   Patient Currently Receiving the Following Services: Group Home   Determination of Need: Emergent (2  hours)   Options For Referral: ED Visit   Disposition Recommendation per psychiatric provider: Observe overnight and reassess tomorrow (12/17/2023)  Kiki DOROTHA Barge MS, LCAS, [facility], [facility] Therapeutic Triage Specialist 12/16/2023 4:52 PM

## 2023-12-16 NOTE — ED Notes (Signed)
 Blood draw attempted twice without success.

## 2023-12-16 NOTE — ED Notes (Signed)
 Patient is vol pending reassess int the a.m

## 2023-12-16 NOTE — ED Provider Notes (Signed)
 [facility] Provider Note    Event Date/Time   First MD Initiated Contact with Patient 12/16/23 1356     (approximate)   History   Chief Complaint Suicidal   HPI  Sherry Bryan is a 28 y.o. female with past medical history of blood clots on Eliquis , seizures, intellectual disability, borderline personality disorder, and PTSD who presents to the ED complaining of suicidal ideation.  Patient reports that she has been feeling suicidal for the past month, describes thoughts of ending her life by hanging herself.  She also admits that she ran away from her group home earlier today because I was not feeling safe.  She does report that she has had a cough for the past week with some mild difficulty breathing and chest discomfort.  She describes the pain in her chest as sharp and worse when she coughs.  She reports compliance with her medications, denies alcohol or drug use.     Physical Exam   Triage Vital Signs: ED Triage Vitals  Encounter Vitals Group     BP 12/16/23 1327 130/84     Girls Systolic BP Percentile --      Girls Diastolic BP Percentile --      Boys Systolic BP Percentile --      Boys Diastolic BP Percentile --      Pulse Rate 12/16/23 1327 (!) 106     Resp 12/16/23 1327 20     Temp 12/16/23 1327 98.3 F (36.8 C)     Temp Source 12/16/23 1327 Oral     SpO2 12/16/23 1327 95 %     Weight 12/16/23 1322 (!) 304 lb (137.9 kg)     Height 12/16/23 1322 5' 4 (1.626 m)     Head Circumference --      Peak Flow --      Pain Score 12/16/23 1322 7     Pain Loc --      Pain Education --      Exclude from Growth Chart --     Most recent vital signs: Vitals:   12/16/23 1327  BP: 130/84  Pulse: (!) 106  Resp: 20  Temp: 98.3 F (36.8 C)  SpO2: 95%    Constitutional: Alert and oriented. Eyes: Conjunctivae are normal. Head: Atraumatic. Nose: No congestion/rhinnorhea. Mouth/Throat: Mucous membranes are moist.  Cardiovascular: Normal  rate, regular rhythm. Grossly normal heart sounds.  2+ radial pulses bilaterally. Respiratory: Normal respiratory effort.  No retractions. Lungs CTAB. Gastrointestinal: Soft and nontender. No distention. Musculoskeletal: No lower extremity tenderness nor edema.  Neurologic:  Normal speech and language. No gross focal neurologic deficits are appreciated.    ED Results / Procedures / Treatments   Labs (all labs ordered are listed, but only abnormal results are displayed) Labs Reviewed  RESP PANEL BY RT-PCR (RSV, FLU A&B, COVID)  [id number]  ETHANOL  BASIC METABOLIC PANEL WITH GFR  CBC  URINE DRUG SCREEN  POC URINE PREG, ED  TROPONIN T, HIGH SENSITIVITY     EKG  ED ECG REPORT I, Carlin Palin, the attending physician, personally viewed and interpreted this ECG.   Date: 12/16/2023  EKG Time: 13:35  Rate: 105  Rhythm: sinus tachycardia  Axis: Normal  Intervals:none  ST&T Change: None  RADIOLOGY Chest x-ray reviewed and interpreted by me with no infiltrate, edema, or effusion.  PROCEDURES:  Critical Care performed: No  Procedures   MEDICATIONS ORDERED IN ED: Medications - No data to display   IMPRESSION / MDM /  ASSESSMENT AND PLAN / ED COURSE  I reviewed the triage vital signs and the nursing notes.                              28 y.o. female with past medical history of intellectual disability, seizures, blood clots on Eliquis , borderline personality disorder, and PTSD who presents to the ED complaining of suicidal ideation and plan to hang herself.  Patient's presentation is most consistent with acute presentation with potential threat to life or bodily function.  Differential diagnosis includes, but is not limited to, ACS, PE, pneumonia, pneumothorax, anemia, electrolyte abnormality, AKI, depression, anxiety, suicidal ideation.  Patient nontoxic-appearing and in no acute distress, vital signs are remarkable for mild tachycardia but otherwise reassuring.  She is  not in any respiratory distress and maintaining oxygen saturations on room air, chest x-ray negative for acute process and EKG shows no evidence of arrhythmia or ischemia.  Labs are pending at this time, chest discomfort and shortness of breath seem most likely to be due to viral infection, COVID and flu testing is also pending.  Patient currently calm and cooperative, agreeable to potential psychiatric admission, will maintain voluntary status.  Patient turned over to oncoming divider pending lab results and medical clearance for psychiatric disposition.      FINAL CLINICAL IMPRESSION(S) / ED DIAGNOSES   Final diagnoses:  Suicidal ideation  Bronchitis     Rx / DC Orders   ED Discharge Orders     None        Note:  This document was prepared using Dragon voice recognition software and may include unintentional dictation errors.   Willo Dunnings, MD 12/16/23 1444

## 2023-12-16 NOTE — Consult Note (Signed)
 [facility] Psychiatric Consult Initial  Patient Name: .Sherry Bryan  MRN: [id number]  DOB: Jan 15, 1995  Consult Order details:  Orders (From admission, onward)     Start     Ordered   12/16/23 1406  IP CONSULT TO PSYCHIATRY       Ordering Provider: Willo Dunnings, MD  Provider:  (Not yet assigned)  Question:  Reason for consult:  Answer:  Medication management   12/16/23 1406   12/16/23 1406  CONSULT TO CALL ACT TEAM       Ordering Provider: Willo Dunnings, MD  Provider:  (Not yet assigned)  Question:  Reason for Consult?  Answer:  SI   12/16/23 1406             Mode of Visit: In person    Psychiatry Consult Evaluation  Service Date: December 16, 2023 LOS:  LOS: 0 days  Chief Complaint I ran off  Primary Psychiatric Diagnoses   Suicidal ideations   Assessment   Sherry Bryan is a 28 y.o. female admitted: Presented to the EDfor 12/16/2023  1:53 PM for suicidal ideation . She carries the psychiatric diagnoses of intellectual disability, borderline personality disorder, and PTSD  and has a past medical history of  blood clots on Eliquis , seizures.  On assessment today, patient only participated minimally stating she did not feel physically well.  Current exam was limited due to this.  Patient was just seen here yesterday for suicidal ideation.  Patient reported running off from her group home due to not feeling safe.  When asked to elaborate, patient stated well I miss my mom and dad and its close to the holidays and it always gets worse.  Patient denied that anyone was physically or emotionally harming her at the group home.  As stated in triage note, patient endorsed suicidal thoughts with plan but denied intent. This is a chronic presentation for patient.   Patient denied homicidal ideation.  Patient denied auditory or visual hallucinations as well.  Patient reported being compliant with current medication regimen. Patient is established with outpatient  providers.  This provider attempted to contact group home and legal guardian.  No answer at this time, HIPAA compliant voicemail left for legal guardian requesting return call.  Patient will be observed overnight to ensure safety and monitor for any unsafe behaviors and allow for psychiatry team to get into contact with both group home and legal guardian.         Diagnoses:  Active Hospital problems: Active Problems:   Suicidal ideations    Plan   ## Psychiatric Medication Recommendations:  One med rec completed, recommended to restart home medications  ## Medical Decision Making Capacity: Patient has a guardian and has thus been adjudicated incompetent; please involve patients guardian in medical decision making  ## Further Work-up:   -- most recent EKG on 12/16/2023 had QtC of 444 -- Pertinent labwork reviewed earlier this admission includes: still pending   ## Disposition:-- Reassess in AM  ## Behavioral / Environmental: -Utilize compassion and acknowledge the patient's experiences while setting clear and realistic expectations for care.    ## Safety and Observation Level:  - Based on my clinical evaluation, I estimate the patient to be at moderate risk of self harm in the current setting. Patient chronically at an elevated risk due to history. Unit can continue with q15 minute protcol safety checks - At this time, we recommend  routine. This decision is based on my review of the chart including patient's  history and current presentation, interview of the patient, mental status examination, and consideration of suicide risk including evaluating suicidal ideation, plan, intent, suicidal or self-harm behaviors, risk factors, and protective factors. This judgment is based on our ability to directly address suicide risk, implement suicide prevention strategies, and develop a safety plan while the patient is in the clinical setting. Please contact our team if there is a concern that risk  level has changed.   Suicide Risk Assessment: Patient has following modifiable risk factors for suicide: active suicidal ideation, which we are addressing by utilizing therapeutic communication to give patient safe space to discuss current emotional status. Patient has following non-modifiable or demographic risk factors for suicide: psychiatric hospitalization Patient has the following protective factors against suicide: Access to outpatient mental health care  Thank you for this consult request. Recommendations have been communicated to the primary team.  We will reassess in the AM at this time.   Zelda Sharps, NP        History of Present Illness  Relevant Aspects of [facility]   Patient Report:  On assessment today, patient only participated minimally stating she did not feel physically well.  Current exam was limited due to this.  Patient was just seen here yesterday for suicidal ideation.  Patient reported running off from her group home due to not feeling safe.  When asked to elaborate, patient stated well I miss my mom and dad and its close to the holidays and it always gets worse.  Patient denied that anyone was physically or emotionally harming her at the group home.  As stated in triage note, patient endorsed suicidal thoughts with plan but denied intent. This is a chronic presentation for patient.   Patient denied homicidal ideation.  Patient denied auditory or visual hallucinations as well.  Patient reported being compliant with current medication regimen. Patient is established with outpatient providers.  This provider attempted to contact group home and legal guardian.  No answer at this time, HIPAA compliant voicemail left for legal guardian requesting return call.  Patient will be observed overnight to ensure safety and monitor for any unsafe behaviors and allow for psychiatry team to get into contact with both group home and legal guardian.   Psych ROS:  Depression:  Endorsed Anxiety:  Denied Mania (lifetime and current): Denied Psychosis: (lifetime and current): Denied  Collateral information:  Attempted to contact legal guardian and group home-no answer at this time.      Psychiatric and Social History  Psychiatric History:  Information collected from Patient/chart review  Prev Dx/Sx: Bipolar disorder, Major depressive disorder, and Generalized anxiety disorder , IDD Current Psych Provider: [facility]  Home Meds (current): patient unsure Previous Med Trials: Invega, Haldol, Zyprexa , Abilify, Vraylar, Rexulti per patient report  Therapy: Unknown  Prior Psych Hospitalization: Yes  Prior Self Harm: Yes Prior Violence: Yes  Family Psych History: Patient reports family history of bipolar disorder, ADHD, ODD, and substance abuse in her mother  Family Hx suicide: unknown   Educational Hx: 11th grade Occupational Hx: Unemployed, receives disability Legal Hx: Unknown Living Situation: Group Home Access to weapons/lethal means: Denies   Substance History Alcohol: Denies Tobacco: Patient reports smoking 2 cigarettes/day and vaping Illicit drugs: Denies Prescription drug abuse: Denies Rehab hx: Denies  Exam Findings  Physical Exam: Reviewed and agree with the physical exam findings conducted by the medical provider Vital Signs:  Temp:  [98.3 F (36.8 C)] 98.3 F (36.8 C) (12/06 1327) Pulse Rate:  [99-106]  106 (12/06 1327) Resp:  [17-20] 20 (12/06 1327) BP: (127-130)/(84-92) 130/84 (12/06 1327) SpO2:  [95 %-97 %] 95 % (12/06 1327) Weight:  [137.9 kg] 137.9 kg (12/06 1322) Blood pressure 130/84, pulse (!) 106, temperature 98.3 F (36.8 C), temperature source Oral, resp. rate 20, height 5' 4 (1.626 m), weight (!) 137.9 kg, SpO2 95%. Body mass index is 52.18 kg/m.    Mental Status Exam: General Appearance: Fairly Groomed  Orientation:  Full (Time, Place, and Person)  Memory:  Poor at this time  Concentration:  Concentration:  Poor  Recall:  Poor  Attention  Poor  Eye Contact:  Minimal  Speech:  Slow  Language:  Fair  Volume:  Decreased  Mood: UTA  Affect:  Appropriate  Thought Process:  Coherent  Thought Content:  Logical  Suicidal Thoughts:  Yes.  without intent/plan  Homicidal Thoughts:  No  Judgement:  Poor  Insight:  Lacking  Psychomotor Activity:  Normal  Akathisia:  No  Fund of Knowledge:  Fair      Assets:  Therapist, Sports  Cognition:  Impaired,  Moderate  ADL's:  Intact  AIMS (if indicated):        Other History   These have been pulled in through the EMR, reviewed, and updated if appropriate.  Family History:  The patient's family history is not on file.  Medical History: Past Medical History:  Diagnosis Date   Borderline personality disorder (HCC)    Constipation 05/15/2015   Depression    DMDD (disruptive mood dysregulation disorder) 05/15/2015   History of seasonal allergies 05/15/2015   Hx of gastroesophageal reflux (GERD) 05/15/2015   Hx of seizure disorder 05/15/2015   Intellectual disability 05/22/2015   PTSD (post-traumatic stress disorder)    Seizures (HCC)    last one in 2015   Suicidal ideation     Surgical History: History reviewed. No pertinent surgical history.   Medications:   Current Facility-Administered Medications:    apixaban  (ELIQUIS ) tablet 5 mg, 5 mg, Oral, BID, Jessup, Charles, MD   benztropine  (COGENTIN ) tablet 1 mg, 1 mg, Oral, BID, Jessup, Charles, MD   cloNIDine  (CATAPRES ) tablet 0.1 mg, 0.1 mg, Oral, BID, Jessup, Charles, MD   divalproex  (DEPAKOTE ) DR tablet 1,000 mg, 1,000 mg, Oral, BID, Jessup, Charles, MD   escitalopram  (LEXAPRO ) tablet 10 mg, 10 mg, Oral, Daily, Jessup, Charles, MD   folic acid  (FOLVITE ) tablet 1 mg, 1 mg, Oral, Daily, Jessup, Charles, MD   hydrochlorothiazide  (HYDRODIURIL ) tablet 25 mg, 25 mg, Oral, Daily, Jessup, Charles, MD   levETIRAcetam  (KEPPRA ) tablet 500 mg, 500 mg, Oral, BID,  Jessup, Charles, MD   NOREEN ON 12/17/2023] levothyroxine  (SYNTHROID ) tablet 50 mcg, 50 mcg, Oral, QAC breakfast, Jessup, Charles, MD   LORazepam  (ATIVAN ) tablet 1 mg, 1 mg, Oral, TID, Jessup, Charles, MD   metoprolol  succinate (TOPROL -XL) 24 hr tablet 50 mg, 50 mg, Oral, Daily, Jessup, Charles, MD   pantoprazole  (PROTONIX ) EC tablet 80 mg, 80 mg, Oral, Daily, Jessup, Charles, MD   prazosin  (MINIPRESS ) capsule 2 mg, 2 mg, Oral, QHS, Jessup, Charles, MD   QUEtiapine  (SEROQUEL  XR) 24 hr tablet 50 mg, 50 mg, Oral, QHS, Jessup, Charles, MD   traZODone  (DESYREL ) tablet 300 mg, 300 mg, Oral, QHS, Jessup, Charles, MD   Xanomeline-Trospium  Chloride 125-30 MG CAPS 1 capsule, 1 capsule, Oral, BID, Jessup, Charles, MD  Current Outpatient Medications:    apixaban  (ELIQUIS ) 5 MG TABS tablet, Take 5 mg by mouth 2 (two) times  daily., Disp: , Rfl:    benztropine  (COGENTIN ) 1 MG tablet, Take 1 mg by mouth 2 (two) times daily., Disp: , Rfl:    cloNIDine  (CATAPRES ) 0.1 MG tablet, Take 0.1 mg by mouth 2 (two) times daily., Disp: , Rfl:    divalproex  (DEPAKOTE ) 500 MG DR tablet, Take 1,000 mg by mouth 2 (two) times daily., Disp: , Rfl:    escitalopram  (LEXAPRO ) 10 MG tablet, Take 10 mg by mouth daily., Disp: , Rfl:    folic acid  (FOLVITE ) 1 MG tablet, Take 1 mg by mouth daily., Disp: , Rfl:    hydrochlorothiazide  (HYDRODIURIL ) 25 MG tablet, Take 25 mg by mouth daily., Disp: , Rfl:    levETIRAcetam  (KEPPRA ) 500 MG tablet, Take 500 mg by mouth 2 (two) times daily., Disp: , Rfl:    levothyroxine  (SYNTHROID ) 50 MCG tablet, Take 50 mcg by mouth daily before breakfast., Disp: , Rfl:    loperamide  (IMODIUM  A-D) 2 MG tablet, Take 1 tablet (2 mg total) by mouth 4 (four) times daily as needed for diarrhea or loose stools., Disp: 30 tablet, Rfl: 0   LORazepam  (ATIVAN ) 1 MG tablet, Take 1 mg by mouth 3 (three) times daily., Disp: , Rfl:    meclizine  (ANTIVERT ) 25 MG tablet, Take 25 mg by mouth 3 (three) times daily as needed  for nausea., Disp: , Rfl:    metoprolol  succinate (TOPROL -XL) 50 MG 24 hr tablet, Take 50 mg by mouth daily., Disp: , Rfl:    Multiple Vitamins-Minerals (MULTIVITAMIN ADULTS) TABS, Take 1 tablet by mouth daily., Disp: , Rfl:    omeprazole (PRILOSEC) 40 MG capsule, Take 40 mg by mouth daily., Disp: , Rfl:    prazosin  (MINIPRESS ) 2 MG capsule, Take 2 mg by mouth at bedtime., Disp: , Rfl:    QUEtiapine  (SEROQUEL  XR) 50 MG TB24 24 hr tablet, Take 50 mg by mouth at bedtime., Disp: , Rfl:    trazodone  (DESYREL ) 300 MG tablet, Take 300 mg by mouth at bedtime., Disp: , Rfl:    UZEDY  100 MG/0.28ML syringe, Inject 100 mg into the skin every 30 (thirty) days., Disp: , Rfl:    VENTOLIN  HFA 108 (90 Base) MCG/ACT inhaler, Inhale 2 puffs into the lungs every 6 (six) hours as needed for wheezing or shortness of breath., Disp: , Rfl:    Xanomeline-Trospium  Chloride 125-30 MG CAPS, Take 1 capsule by mouth 2 (two) times daily., Disp: , Rfl:   Allergies: Allergies  Allergen Reactions   Ritalin [Methylphenidate Hcl] Other (See Comments)    seizures   Abilify [Aripiprazole] Other (See Comments)    Shaking or tremors   Lithium      Zelda Sharps, NP This note was created using Scientist, clinical (histocompatibility and immunogenetics). Please excuse any inadvertent transcription errors. Case was discussed with supervising physician Dr. Jadapalle who is agreeable with current plan.

## 2023-12-16 NOTE — ED Notes (Signed)
Patient resting quietly, no acute distress noted.

## 2023-12-16 NOTE — ED Triage Notes (Signed)
 Pt from [facility] group [facility]- picked up outside of a house after PD- Pt c/o CP 7/10 SHOB and cough x1 week. Pt threw up prior to EMS arrival. Pt states she want to end my life by hanging myself. Pt states she left group home because I wasn't feeling safe. [id number]  97.9 oral  95% RA  144/101  107 ST  Pt's lips and face appear pale, sough and congestion noted.

## 2023-12-16 NOTE — ED Notes (Addendum)
 Pt changed into paper scrubs, socks, and brief at this time.  Belongings include: Blue long sleeve shirt Blue crocks Grey and black sweat pants Floral puffer coat

## 2023-12-17 ENCOUNTER — Emergency Department: Payer: MEDICAID

## 2023-12-17 DIAGNOSIS — F4323 Adjustment disorder with mixed anxiety and depressed mood: Secondary | ICD-10-CM | POA: Diagnosis present

## 2023-12-17 DIAGNOSIS — Z1152 Encounter for screening for COVID-19: Secondary | ICD-10-CM | POA: Diagnosis not present

## 2023-12-17 DIAGNOSIS — E039 Hypothyroidism, unspecified: Secondary | ICD-10-CM

## 2023-12-17 DIAGNOSIS — J9601 Acute respiratory failure with hypoxia: Secondary | ICD-10-CM | POA: Insufficient documentation

## 2023-12-17 DIAGNOSIS — Z7901 Long term (current) use of anticoagulants: Secondary | ICD-10-CM | POA: Diagnosis not present

## 2023-12-17 DIAGNOSIS — F259 Schizoaffective disorder, unspecified: Secondary | ICD-10-CM | POA: Diagnosis present

## 2023-12-17 DIAGNOSIS — I959 Hypotension, unspecified: Secondary | ICD-10-CM | POA: Diagnosis present

## 2023-12-17 DIAGNOSIS — F17290 Nicotine dependence, other tobacco product, uncomplicated: Secondary | ICD-10-CM | POA: Diagnosis present

## 2023-12-17 DIAGNOSIS — R45851 Suicidal ideations: Secondary | ICD-10-CM | POA: Diagnosis present

## 2023-12-17 DIAGNOSIS — B9789 Other viral agents as the cause of diseases classified elsewhere: Secondary | ICD-10-CM | POA: Diagnosis present

## 2023-12-17 DIAGNOSIS — A419 Sepsis, unspecified organism: Secondary | ICD-10-CM

## 2023-12-17 DIAGNOSIS — E8729 Other acidosis: Secondary | ICD-10-CM | POA: Diagnosis present

## 2023-12-17 DIAGNOSIS — E662 Morbid (severe) obesity with alveolar hypoventilation: Secondary | ICD-10-CM | POA: Diagnosis present

## 2023-12-17 DIAGNOSIS — I10 Essential (primary) hypertension: Secondary | ICD-10-CM | POA: Diagnosis present

## 2023-12-17 DIAGNOSIS — J189 Pneumonia, unspecified organism: Secondary | ICD-10-CM | POA: Diagnosis not present

## 2023-12-17 DIAGNOSIS — Z23 Encounter for immunization: Secondary | ICD-10-CM | POA: Diagnosis not present

## 2023-12-17 DIAGNOSIS — F4310 Post-traumatic stress disorder, unspecified: Secondary | ICD-10-CM | POA: Diagnosis present

## 2023-12-17 DIAGNOSIS — D509 Iron deficiency anemia, unspecified: Secondary | ICD-10-CM | POA: Diagnosis present

## 2023-12-17 DIAGNOSIS — F603 Borderline personality disorder: Secondary | ICD-10-CM | POA: Diagnosis present

## 2023-12-17 DIAGNOSIS — B348 Other viral infections of unspecified site: Secondary | ICD-10-CM | POA: Diagnosis not present

## 2023-12-17 DIAGNOSIS — E66813 Obesity, class 3: Secondary | ICD-10-CM | POA: Diagnosis present

## 2023-12-17 DIAGNOSIS — Z6841 Body Mass Index (BMI) 40.0 and over, adult: Secondary | ICD-10-CM | POA: Diagnosis not present

## 2023-12-17 DIAGNOSIS — F341 Dysthymic disorder: Secondary | ICD-10-CM | POA: Diagnosis not present

## 2023-12-17 DIAGNOSIS — J9621 Acute and chronic respiratory failure with hypoxia: Secondary | ICD-10-CM | POA: Diagnosis present

## 2023-12-17 DIAGNOSIS — G40909 Epilepsy, unspecified, not intractable, without status epilepticus: Secondary | ICD-10-CM | POA: Diagnosis present

## 2023-12-17 DIAGNOSIS — J1289 Other viral pneumonia: Secondary | ICD-10-CM | POA: Diagnosis present

## 2023-12-17 DIAGNOSIS — J9622 Acute and chronic respiratory failure with hypercapnia: Secondary | ICD-10-CM | POA: Diagnosis present

## 2023-12-17 DIAGNOSIS — F79 Unspecified intellectual disabilities: Secondary | ICD-10-CM | POA: Diagnosis present

## 2023-12-17 DIAGNOSIS — J159 Unspecified bacterial pneumonia: Secondary | ICD-10-CM | POA: Diagnosis present

## 2023-12-17 LAB — CBC WITH DIFFERENTIAL/PLATELET
Abs Immature Granulocytes: 0.14 K/uL — ABNORMAL HIGH (ref 0.00–0.07)
Basophils Absolute: 0 K/uL (ref 0.0–0.1)
Basophils Relative: 0 %
Eosinophils Absolute: 0 K/uL (ref 0.0–0.5)
Eosinophils Relative: 0 %
HCT: 39.2 % (ref 36.0–46.0)
Hemoglobin: 12 g/dL (ref 12.0–15.0)
Immature Granulocytes: 2 %
Lymphocytes Relative: 23 %
Lymphs Abs: 1.8 K/uL (ref 0.7–4.0)
MCH: 24.7 pg — ABNORMAL LOW (ref 26.0–34.0)
MCHC: 30.6 g/dL (ref 30.0–36.0)
MCV: 80.7 fL (ref 80.0–100.0)
Monocytes Absolute: 0.5 K/uL (ref 0.1–1.0)
Monocytes Relative: 6 %
Neutro Abs: 5.3 K/uL (ref 1.7–7.7)
Neutrophils Relative %: 69 %
Platelets: 153 K/uL (ref 150–400)
RBC: 4.86 MIL/uL (ref 3.87–5.11)
RDW: 22.3 % — ABNORMAL HIGH (ref 11.5–15.5)
Smear Review: NORMAL
WBC: 7.7 K/uL (ref 4.0–10.5)
nRBC: 0.6 % — ABNORMAL HIGH (ref 0.0–0.2)

## 2023-12-17 LAB — BASIC METABOLIC PANEL WITH GFR
Anion gap: 6 (ref 5–15)
BUN: 8 mg/dL (ref 6–20)
CO2: 30 mmol/L (ref 22–32)
Calcium: 8.3 mg/dL — ABNORMAL LOW (ref 8.9–10.3)
Chloride: 101 mmol/L (ref 98–111)
Creatinine, Ser: 0.9 mg/dL (ref 0.44–1.00)
GFR, Estimated: >60 mL/min (ref >60)
Glucose, Bld: 117 mg/dL — ABNORMAL HIGH (ref 70–99)
Potassium: 3.9 mmol/L (ref 3.5–5.1)
Sodium: 138 mmol/L (ref 135–145)

## 2023-12-17 LAB — PROCALCITONIN: Procalcitonin: <0.1 ng/mL

## 2023-12-17 LAB — HIV ANTIBODY (ROUTINE TESTING W REFLEX): HIV Screen 4th Generation wRfx: NONREACTIVE

## 2023-12-17 LAB — STREP PNEUMONIAE URINARY ANTIGEN: Strep Pneumo Urinary Antigen: NEGATIVE

## 2023-12-17 LAB — LACTIC ACID, PLASMA
Lactic Acid, Venous: 2.5 mmol/L (ref 0.5–1.9)
Lactic Acid, Venous: 1.6 mmol/L (ref 0.5–1.9)

## 2023-12-17 MED ORDER — SODIUM CHLORIDE 0.9 % IV SOLN
500.0000 mg | INTRAVENOUS | Status: AC
  Administered 2023-12-17 – 2023-12-19 (×3): 500 mg via INTRAVENOUS
  Filled 2023-12-17 (×3): qty 5

## 2023-12-17 MED ORDER — SODIUM CHLORIDE 0.9 % IV SOLN
100.0000 mg | Freq: Once | INTRAVENOUS | Status: AC
  Administered 2023-12-17: 100 mg via INTRAVENOUS
  Filled 2023-12-17: qty 100

## 2023-12-17 MED ORDER — SODIUM CHLORIDE 0.9 % IV BOLUS
1000.0000 mL | Freq: Once | INTRAVENOUS | Status: AC
  Administered 2023-12-17: 1000 mL via INTRAVENOUS

## 2023-12-17 MED ORDER — GUAIFENESIN ER 600 MG PO TB12
600.0000 mg | ORAL_TABLET | Freq: Two times a day (BID) | ORAL | Status: DC
  Administered 2023-12-17 – 2023-12-19 (×6): 600 mg via ORAL
  Filled 2023-12-17 (×6): qty 1

## 2023-12-17 MED ORDER — IOHEXOL 350 MG/ML SOLN
75.0000 mL | Freq: Once | INTRAVENOUS | Status: AC | PRN
  Administered 2023-12-17: 75 mL via INTRAVENOUS

## 2023-12-17 MED ORDER — XANOMELINE-TROSPIUM CHLORIDE 125-30 MG PO CAPS: 1.0000 | ORAL_CAPSULE | Freq: Two times a day (BID) | ORAL | Status: DC

## 2023-12-17 MED ORDER — ALBUTEROL SULFATE (2.5 MG/3ML) 0.083% IN NEBU
2.5000 mg | INHALATION_SOLUTION | RESPIRATORY_TRACT | Status: DC | PRN
  Administered 2023-12-18 – 2023-12-24 (×3): 2.5 mg via RESPIRATORY_TRACT
  Filled 2023-12-17 (×3): qty 3

## 2023-12-17 MED ORDER — SODIUM CHLORIDE 0.9 % IV SOLN
2.0000 g | INTRAVENOUS | Status: AC
  Administered 2023-12-18 – 2023-12-22 (×5): 2 g via INTRAVENOUS
  Filled 2023-12-17 (×5): qty 20

## 2023-12-17 MED ORDER — ACETAMINOPHEN 325 MG PO TABS
650.0000 mg | ORAL_TABLET | Freq: Four times a day (QID) | ORAL | Status: DC | PRN
  Administered 2023-12-18 – 2024-01-04 (×7): 650 mg via ORAL
  Filled 2023-12-17 (×7): qty 2

## 2023-12-17 MED ORDER — SENNOSIDES-DOCUSATE SODIUM 8.6-50 MG PO TABS
1.0000 | ORAL_TABLET | Freq: Every evening | ORAL | Status: DC | PRN
  Administered 2023-12-20: 1 via ORAL
  Filled 2023-12-17: qty 1

## 2023-12-17 MED ORDER — ACETAMINOPHEN 500 MG PO TABS
1000.0000 mg | ORAL_TABLET | Freq: Once | ORAL | Status: AC
  Administered 2023-12-17: 1000 mg via ORAL
  Filled 2023-12-17: qty 2

## 2023-12-17 MED ORDER — ONDANSETRON HCL 4 MG PO TABS
4.0000 mg | ORAL_TABLET | Freq: Four times a day (QID) | ORAL | Status: DC | PRN
  Administered 2023-12-22 – 2024-01-05 (×4): 4 mg via ORAL
  Filled 2023-12-17 (×4): qty 1

## 2023-12-17 MED ORDER — SODIUM CHLORIDE 0.9 % IV SOLN: INTRAVENOUS | Status: AC

## 2023-12-17 MED ORDER — ACETAMINOPHEN 650 MG RE SUPP: 650.0000 mg | Freq: Four times a day (QID) | RECTAL | Status: DC | PRN

## 2023-12-17 MED ORDER — IPRATROPIUM-ALBUTEROL 0.5-2.5 (3) MG/3ML IN SOLN
3.0000 mL | Freq: Three times a day (TID) | RESPIRATORY_TRACT | Status: AC
  Administered 2023-12-17 – 2023-12-18 (×4): 3 mL via RESPIRATORY_TRACT
  Filled 2023-12-17 (×4): qty 3

## 2023-12-17 MED ORDER — SODIUM CHLORIDE 0.9 % IV SOLN
1.0000 g | Freq: Once | INTRAVENOUS | Status: AC
  Administered 2023-12-17: 1 g via INTRAVENOUS
  Filled 2023-12-17: qty 10

## 2023-12-17 MED ORDER — ONDANSETRON HCL 4 MG/2ML IJ SOLN
4.0000 mg | Freq: Four times a day (QID) | INTRAMUSCULAR | Status: DC | PRN
  Administered 2023-12-18 – 2023-12-28 (×4): 4 mg via INTRAVENOUS
  Filled 2023-12-17 (×5): qty 2

## 2023-12-17 NOTE — BH Assessment (Signed)
 Writer left message for patient's legal guardian, Sherry Bryan [phone] to return call.

## 2023-12-17 NOTE — Assessment & Plan Note (Signed)
-   Patient has legal guardian for decision making

## 2023-12-17 NOTE — Assessment & Plan Note (Addendum)
-  Continue home Keppra and Depakote

## 2023-12-17 NOTE — ED Provider Notes (Addendum)
 Emergency Medicine Observation Re-evaluation Note  Sherry Bryan is a 28 y.o. female, seen on rounds today.  Pt initially presented to the ED for complaints of Suicidal Currently, the patient is resting.  Physical Exam  BP 106/64   Pulse 77   Temp 98.6 F (37 C)   Resp (!) 21   Ht 5' 4 (1.626 m)   Wt (!) 137.9 kg   SpO2 93%   BMI 52.18 kg/m  Physical Exam .Gen:  No acute distress Resp:  Breathing easily and comfortably, no accessory muscle usage Neuro:  Moving all four extremities, no gross focal neuro deficits Psych:  Resting currently, calm when awake   ED Course / MDM / Plan:   Patient noted to be hypoxic to 89%.  Also complaining about a headache.  She has been getting her home medications.  Will bring her back over from [facility] over to the main side, will get a CT head as well as CT PE study.  Patient notes headache, progressing over the course of the day, notes cough as well as shortness of breath but no chest pain.  She is alert, moving all 4 extremities without focal weakness or numbness, no facial droop or slurred speech.  Will give her some Tylenol  for the headache.  Will start with 2 L nasal cannula.  Given that repeat vitals, hypoxia was noted, mildly hypertensive, will reorder labs, will get lactate and blood cultures, IV fluids, will start her on empiric antibiotics to cover for possible CAP.  She is pending her CTs.  Will need to be admitted for further management.  Regards to her labs and imaging, CT head without obvious intracranial hemorrhage, did show bilateral mucosal thickening and air-fluid levels in the maxillary ethmoid sinuses.  CT PE study did not show a PE but it showed possible pneumonia.  She is already been started on IV antibiotics to cover for pneumonia, her lactate was mildly elevated, she is getting fluids at this time, no leukocytosis, BMP is pending.  Given her overall clinical picture, she will need to come in for further management.  Consulted  hospitalist who will evaluate the patient and admit her.  She is admitted.   SABRACRITICAL CARE Performed by: Lorelle Jerri Cheadle   Total critical care time: 40 minutes  Critical care time was exclusive of separately billable procedures and treating other patients.  Critical care was necessary to treat or prevent imminent or life-threatening deterioration.  Critical care was time spent personally by me on the following activities: development of treatment plan with patient and/or surrogate as well as nursing, discussions with consultants, evaluation of patient's response to treatment, examination of patient, obtaining history from patient or surrogate, ordering and performing treatments and interventions, ordering and review of laboratory studies, ordering and review of radiographic studies, pulse oximetry and re-evaluation of patient's condition.     Cheadle Lorelle Jerri, MD 12/17/23 9282    Cheadle Lorelle Jerri, MD 12/17/23 [phone]

## 2023-12-17 NOTE — Consult Note (Signed)
 [facility] Psychiatric Consult Initial  Patient Name: .Jahrel Borthwick Mcnicholas  MRN: [id number]  DOB: [birth year]  Consult Order details:  Orders (From admission, onward)     Start     Ordered   12/16/23 1406  IP CONSULT TO PSYCHIATRY       Ordering Provider: Willo Dunnings, MD  Provider:  (Not yet assigned)  Question:  Reason for consult:  Answer:  Medication management   12/16/23 1406   12/16/23 1406  CONSULT TO CALL ACT TEAM       Ordering Provider: Willo Dunnings, MD  Provider:  (Not yet assigned)  Question:  Reason for Consult?  Answer:  SI   12/16/23 1406             Mode of Visit: In person    Psychiatry Consult Evaluation  Service Date: December 17, 2023 LOS:  LOS: 0 days  Chief Complaint I ran off  Primary Psychiatric Diagnoses   Suicidal ideations   Assessment   Nicci Vaughan is a 28 y.o. female admitted: Presented to the EDfor 12/16/2023  1:53 PM for suicidal ideation . She carries the psychiatric diagnoses of intellectual disability, borderline personality disorder, and PTSD  and has a past medical history of  blood clots on Eliquis , seizures.  On assessment today, patient only participated minimally stating she did not feel physically well.  Current exam was limited due to this.  Patient was just seen here yesterday for suicidal ideation.  Patient reported running off from her group home due to not feeling safe.  When asked to elaborate, patient stated well I miss my mom and dad and its close to the holidays and it always gets worse.  Patient denied that anyone was physically or emotionally harming her at the group home.  As stated in triage note, patient endorsed suicidal thoughts with plan but denied intent. This is a chronic presentation for patient.   Patient denied homicidal ideation.  Patient denied auditory or visual hallucinations as well.  Patient reported being compliant with current medication regimen. Patient is established with outpatient  providers.  This provider attempted to contact group home and legal guardian.  No answer at this time, HIPAA compliant voicemail left for legal guardian requesting return call.  Patient will be observed overnight to ensure safety and monitor for any unsafe behaviors and allow for psychiatry team to get into contact with both group home and legal guardian.  12/17/2023: Patient seen on rounds today by psychiatry.  Patient reported still feeling not good when referring to her physical health.  Patient still endorsed suicidal thoughts, but reported today that her plan was to take her medications.  Patient currently resides at a group home and medications are given by group home staff.  Patient does not have current access to her medications as they are given by staff.  Upon further questioning, patient reported she feels as though she always misses her parents more around the holidays and it is hard for her to handle.  Patient denied that she had ever spoken with a therapist about these concerns.  Patient did report that she would be willing to speak with a therapist about these things if she had one.  Patient reported she felt as though this would help.  Patient denied homicidal ideations.  Patient denied auditory or visual hallucinations as well.  Patient has continued to maintain safe behaviors while in the emergency department.  Per nursing staff and review of chart, patient has displayed no self-harm behaviors.  It does not appear that patient has required any IM agitation medications while holding in the emergency room. Although the patient endorses chronic suicidal ideation with an identified plan, they currently reside in a structured group home environment and have historically demonstrated the ability to maintain safety within that setting. The support and supervision available in the group home appear sufficient for ongoing community safety.  We will recommend that patient be established with therapy  services along with her medication management team that sees her in the group home.  At this time, psychiatry will sign off.  Patient is currently being medically admitted.  Please reach out for any new concerns or questions.        Diagnoses:  Active Hospital problems: Active Problems:   Suicidal ideations    Plan   ## Psychiatric Medication Recommendations:  One med rec completed, recommended to restart home medications  ## Medical Decision Making Capacity: Patient has a guardian and has thus been adjudicated incompetent; please involve patients guardian in medical decision making  ## Further Work-up:   -- most recent EKG on 12/16/2023 had QtC of 444 -- Pertinent labwork reviewed earlier this admission includes: still pending   ## Disposition:--Patient is being medically admitted  ## Behavioral / Environmental: -Utilize compassion and acknowledge the patient's experiences while setting clear and realistic expectations for care.    ## Safety and Observation Level:  - Based on my clinical evaluation, I estimate the patient to be at low risk of self harm in the current setting. Patient chronically at an elevated risk due to history. Unit can continue with q15 minute protcol safety checks - At this time, we recommend  routine. This decision is based on my review of the chart including patient's history and current presentation, interview of the patient, mental status examination, and consideration of suicide risk including evaluating suicidal ideation, plan, intent, suicidal or self-harm behaviors, risk factors, and protective factors. This judgment is based on our ability to directly address suicide risk, implement suicide prevention strategies, and develop a safety plan while the patient is in the clinical setting. Please contact our team if there is a concern that risk level has changed.   Suicide Risk Assessment: Patient has following modifiable risk factors for suicide: active  suicidal ideation, which we are addressing by utilizing therapeutic communication to give patient safe space to discuss current emotional status. Patient has following non-modifiable or demographic risk factors for suicide: psychiatric hospitalization Patient has the following protective factors against suicide: Access to outpatient mental health care  Thank you for this consult request. Recommendations have been communicated to the primary team.  We will sign off at this time.   Zelda Sharps, NP        History of Present Illness  Relevant Aspects of [facility]   Patient Report:  On assessment today, patient only participated minimally stating she did not feel physically well.  Current exam was limited due to this.  Patient was just seen here yesterday for suicidal ideation.  Patient reported running off from her group home due to not feeling safe.  When asked to elaborate, patient stated well I miss my mom and dad and its close to the holidays and it always gets worse.  Patient denied that anyone was physically or emotionally harming her at the group home.  As stated in triage note, patient endorsed suicidal thoughts with plan but denied intent. This is a chronic presentation for patient.   Patient denied homicidal ideation.  Patient denied auditory or visual hallucinations as well.  Patient reported being compliant with current medication regimen. Patient is established with outpatient providers.  This provider attempted to contact group home and legal guardian.  No answer at this time, HIPAA compliant voicemail left for legal guardian requesting return call.  Patient will be observed overnight to ensure safety and monitor for any unsafe behaviors and allow for psychiatry team to get into contact with both group home and legal guardian.   Psych ROS:  Depression: Endorsed Anxiety:  Denied Mania (lifetime and current): Denied Psychosis: (lifetime and current): Denied  Collateral  information:  Attempted to contact legal guardian and group home-no answer at this time.      Psychiatric and Social History  Psychiatric History:  Information collected from Patient/chart review  Prev Dx/Sx: Bipolar disorder, Major depressive disorder, and Generalized anxiety disorder , IDD Current Psych Provider: [facility]  Home Meds (current): patient unsure Previous Med Trials: Invega, Haldol, Zyprexa , Abilify, Vraylar, Rexulti per patient report  Therapy: Unknown  Prior Psych Hospitalization: Yes  Prior Self Harm: Yes Prior Violence: Yes  Family Psych History: Patient reports family history of bipolar disorder, ADHD, ODD, and substance abuse in her mother  Family Hx suicide: unknown   Educational Hx: 11th grade Occupational Hx: Unemployed, receives disability Legal Hx: Unknown Living Situation: Group Home Access to weapons/lethal means: Denies   Substance History Alcohol: Denies Tobacco: Patient reports smoking 2 cigarettes/day and vaping Illicit drugs: Denies Prescription drug abuse: Denies Rehab hx: Denies  Exam Findings  Physical Exam: Reviewed and agree with the physical exam findings conducted by the medical provider Vital Signs:  Temp:  [97.5 F (36.4 C)-99.7 F (37.6 C)] 98.6 F (37 C) (12/07 0744) Pulse Rate:  [79-106] 79 (12/07 0744) Resp:  [17-21] 21 (12/07 0744) BP: (89-130)/(53-84) 98/71 (12/07 0744) SpO2:  [89 %-95 %] 89 % (12/07 0744) Weight:  [137.9 kg] 137.9 kg (12/06 1322) Blood pressure 98/71, pulse 79, temperature 98.6 F (37 C), resp. rate (!) 21, height 5' 4 (1.626 m), weight (!) 137.9 kg, SpO2 (!) 89%. Body mass index is 52.18 kg/m.    Mental Status Exam: General Appearance: Fairly Groomed  Orientation:  Full (Time, Place, and Person)  Memory:  Fair  Concentration:  Concentration: Poor  Recall:  Poor  Attention  Poor  Eye Contact:  Minimal  Speech:  Slow  Language:  Fair  Volume:  Decreased  Mood: Okay  Affect:   Appropriate  Thought Process:  Coherent  Thought Content:  Logical  Suicidal Thoughts:  Yes.  without intent/plan  Homicidal Thoughts:  No  Judgement:  Poor  Insight:  Lacking  Psychomotor Activity:  Normal  Akathisia:  No  Fund of Knowledge:  Fair      Assets:  Therapist, Sports  Cognition:  Impaired,  Moderate  ADL's:  Intact  AIMS (if indicated):        Other History   These have been pulled in through the EMR, reviewed, and updated if appropriate.  Family History:  The patient's family history is not on file.  Medical History: Past Medical History:  Diagnosis Date  . Borderline personality disorder (HCC)   . Constipation 05/15/2015  . Depression   . DMDD (disruptive mood dysregulation disorder) 05/15/2015  . History of seasonal allergies 05/15/2015  . Hx of gastroesophageal reflux (GERD) 05/15/2015  . Hx of seizure disorder 05/15/2015  . Intellectual disability 05/22/2015  . PTSD (post-traumatic stress disorder)   .  Seizures (HCC)    last one in 2015  . Suicidal ideation     Surgical History: History reviewed. No pertinent surgical history.   Medications:   Current Facility-Administered Medications:  .  apixaban  (ELIQUIS ) tablet 5 mg, 5 mg, Oral, BID, Jessup, Charles, MD, 5 mg at 12/17/23 1037 .  benztropine  (COGENTIN ) tablet 1 mg, 1 mg, Oral, BID, Jessup, Charles, MD, 1 mg at 12/17/23 1037 .  cloNIDine  (CATAPRES ) tablet 0.1 mg, 0.1 mg, Oral, BID, Jessup, Charles, MD, 0.1 mg at 12/17/23 1037 .  divalproex  (DEPAKOTE ) DR tablet 1,000 mg, 1,000 mg, Oral, BID, Jessup, Charles, MD, 1,000 mg at 12/17/23 1036 .  doxycycline  (VIBRAMYCIN ) 100 mg in sodium chloride  0.9 % 250 mL IVPB, 100 mg, Intravenous, Once, Tan, Lorelle Cummins, MD .  escitalopram  (LEXAPRO ) tablet 10 mg, 10 mg, Oral, Daily, Jessup, Charles, MD, 10 mg at 12/17/23 1036 .  folic acid  (FOLVITE ) tablet 1 mg, 1 mg, Oral, Daily, Jessup, Charles, MD, 1 mg at 12/17/23 1036 .   hydrochlorothiazide  (HYDRODIURIL ) tablet 25 mg, 25 mg, Oral, Daily, Jessup, Charles, MD, 25 mg at 12/16/23 1655 .  levETIRAcetam  (KEPPRA ) tablet 500 mg, 500 mg, Oral, BID, Jessup, Charles, MD, 500 mg at 12/17/23 1037 .  levothyroxine  (SYNTHROID ) tablet 50 mcg, 50 mcg, Oral, QAC breakfast, Jessup, Charles, MD, 50 mcg at 12/17/23 1037 .  LORazepam  (ATIVAN ) tablet 1 mg, 1 mg, Oral, TID, Jessup, Charles, MD, 1 mg at 12/17/23 1036 .  metoprolol  succinate (TOPROL -XL) 24 hr tablet 50 mg, 50 mg, Oral, Daily, Jessup, Charles, MD, 50 mg at 12/16/23 1655 .  pantoprazole  (PROTONIX ) EC tablet 80 mg, 80 mg, Oral, Daily, Jessup, Charles, MD, 80 mg at 12/17/23 1035 .  prazosin  (MINIPRESS ) capsule 2 mg, 2 mg, Oral, QHS, Jessup, Charles, MD, 2 mg at 12/16/23 2110 .  QUEtiapine  (SEROQUEL  XR) 24 hr tablet 50 mg, 50 mg, Oral, QHS, Jessup, Charles, MD, 50 mg at 12/16/23 2109 .  traZODone  (DESYREL ) tablet 300 mg, 300 mg, Oral, QHS, Jessup, Charles, MD, 300 mg at 12/16/23 2110 .  Xanomeline-Trospium  Chloride 125-30 MG CAPS 1 capsule, 1 capsule, Oral, BID, Jessup, Charles, MD  Current Outpatient Medications:  .  apixaban  (ELIQUIS ) 5 MG TABS tablet, Take 5 mg by mouth 2 (two) times daily., Disp: , Rfl:  .  benztropine  (COGENTIN ) 1 MG tablet, Take 1 mg by mouth 2 (two) times daily., Disp: , Rfl:  .  cloNIDine  (CATAPRES ) 0.1 MG tablet, Take 0.1 mg by mouth 2 (two) times daily., Disp: , Rfl:  .  divalproex  (DEPAKOTE ) 500 MG DR tablet, Take 1,000 mg by mouth 2 (two) times daily., Disp: , Rfl:  .  escitalopram  (LEXAPRO ) 10 MG tablet, Take 10 mg by mouth daily., Disp: , Rfl:  .  folic acid  (FOLVITE ) 1 MG tablet, Take 1 mg by mouth daily., Disp: , Rfl:  .  hydrochlorothiazide  (HYDRODIURIL ) 25 MG tablet, Take 25 mg by mouth daily., Disp: , Rfl:  .  levETIRAcetam  (KEPPRA ) 500 MG tablet, Take 500 mg by mouth 2 (two) times daily., Disp: , Rfl:  .  levothyroxine  (SYNTHROID ) 50 MCG tablet, Take 50 mcg by mouth daily before breakfast.,  Disp: , Rfl:  .  loperamide  (IMODIUM  A-D) 2 MG tablet, Take 1 tablet (2 mg total) by mouth 4 (four) times daily as needed for diarrhea or loose stools., Disp: 30 tablet, Rfl: 0 .  LORazepam  (ATIVAN ) 1 MG tablet, Take 1 mg by mouth 3 (three) times daily., Disp: , Rfl:  .  meclizine  (ANTIVERT ) 25 MG tablet, Take 25 mg by mouth 3 (three) times daily as needed for nausea., Disp: , Rfl:  .  metoprolol  succinate (TOPROL -XL) 50 MG 24 hr tablet, Take 50 mg by mouth daily., Disp: , Rfl:  .  Multiple Vitamins-Minerals (MULTIVITAMIN ADULTS) TABS, Take 1 tablet by mouth daily., Disp: , Rfl:  .  omeprazole (PRILOSEC) 40 MG capsule, Take 40 mg by mouth daily., Disp: , Rfl:  .  prazosin  (MINIPRESS ) 2 MG capsule, Take 2 mg by mouth at bedtime., Disp: , Rfl:  .  QUEtiapine  (SEROQUEL  XR) 50 MG TB24 24 hr tablet, Take 50 mg by mouth at bedtime., Disp: , Rfl:  .  trazodone  (DESYREL ) 300 MG tablet, Take 300 mg by mouth at bedtime., Disp: , Rfl:  .  UZEDY  100 MG/0.28ML syringe, Inject 100 mg into the skin every 30 (thirty) days., Disp: , Rfl:  .  VENTOLIN  HFA 108 (90 Base) MCG/ACT inhaler, Inhale 2 puffs into the lungs every 6 (six) hours as needed for wheezing or shortness of breath., Disp: , Rfl:  .  Xanomeline-Trospium  Chloride 125-30 MG CAPS, Take 1 capsule by mouth 2 (two) times daily., Disp: , Rfl:   Allergies: Allergies  Allergen Reactions  . Ritalin [Methylphenidate Hcl] Other (See Comments)    seizures  . Abilify [Aripiprazole] Other (See Comments)    Shaking or tremors  . Lithium      Zelda Sharps, NP This note was created using [employer]. Please excuse any inadvertent transcription errors. Case was discussed with supervising physician Dr. Jadapalle who is agreeable with current plan.

## 2023-12-17 NOTE — ED Notes (Signed)
 Hospital meal provided, pt tolerated w/o complaints.  Waste discarded appropriately.

## 2023-12-17 NOTE — Assessment & Plan Note (Addendum)
-   Continue home Cogentin  and Cobenfy

## 2023-12-17 NOTE — Assessment & Plan Note (Addendum)
 Body mass index is 52.18 kg/m.  Meets criteria for morbid obesity based on the presence of 1 or more chronic comorbidities. This complicates overall care and prognosis.

## 2023-12-17 NOTE — Assessment & Plan Note (Signed)
 WBC mildly elevated 10.9 yesterday and now normal at 7.7, SpO2 89%, lactic acid 2.5, BP 89/53 with MAP of 63.  Hypotension could be due to recent administrative of antihypertensives and not sepsis physiology - IV Rocephin  and Zithromax  - IV Fluids --> NS at 100 mL/h - Follow Blood, Sputum cultures - Follow Fever curve - Check PCT - Duonebs - Add on Sputum culture and 20 Pathogen panel - Mucinex  - Pulmonary toilet: Mobilize/incentive spirometry/flutter valve  - Urine studies: Legionella, Strep Antigen

## 2023-12-17 NOTE — ED Notes (Signed)
 Family updated as to patient's status.Mother called in and requested an update, mother states unable to get ahold of legal guardian in the past few days.

## 2023-12-17 NOTE — H&P (Addendum)
 History and Physical    Sherry Bryan FMW:[id number] DOB: 1995/10/02 DOA: 12/16/2023  DOS: the patient was seen and examined on 12/16/2023  PCP: Pcp, No   Patient coming from: Group Home  I have personally briefly reviewed patient's old medical records in [facility] and CareEverywhere  HPI:  Sherry Bryan is a 28 y.o. year old female with past medical history of hypertension, hypothyroidism, seizure disorder, GERD, prior blood clots on Eliquis , major depressive disorder, DMDD, intellectual disability, borderline personality disorder, PTSD, and adjustment disorder.  She presented to [facility] regional ED yesterday with reports of suicidal ideation and productive cough.  Ultimately she was medically cleared and she remained in the ED voluntary status pending psychiatric evaluation.  This morning she reports that she has had a productive cough with green sputum, dyspnea, and chest pain associated with coughing for about 1 week.  She has not had any known sick contacts.   ED Course: This morning patient was noted to be afebrile temp 38.6 C, BP 98/71 and as low as 89/53 with MAP of 63, RR 21, SpO2 89% on room air and improving to 93% on 4 L via nasal cannula. CT head obtained and shows no acute intracranial abnormality with mucosal thickening and air-fluid levels in bilateral maxillary ethmoid sinuses and left sphenoid sinus.  CTA PE obtained and negative for PE, did show ground glass and streaky opacities in the lower lobes bilaterally. Labs notable for leukocytosis WBC 10.9 yesterday on ED arrival however improved to 7.7 this morning, lactic acid mildly elevated at 2.5, blood cultures obtained and in process.  BMP ordered not collected for this morning yesterday BMP grossly normal with mild hyperglycemia with glucose 125 and calcium 8.8.  COVID flu and RSV negative yesterday.  She was given Tylenol , Rocephin , doxycycline , and home meds including antihypertensives.  Patient was evaluated by  telepsychiatry while in the ED who found patient to be at moderate risk of self-harm in the current environment and plan for reassessment this morning for final psychiatric disposition recommendations.  TR[facility] contacted for admission.    Review of Systems: As mentioned in the history of present illness. All other systems reviewed and are negative.  Review of Systems  Constitutional:  Positive for malaise/fatigue. Negative for chills, fever and weight loss.  Eyes:  Negative for photophobia.  Respiratory:  Positive for cough, sputum production and shortness of breath. Negative for wheezing.   Cardiovascular:  Positive for chest pain.       Chest pain with coughing  Gastrointestinal:  Negative for abdominal pain, heartburn, nausea and vomiting.  Genitourinary:  Negative for dysuria.  Musculoskeletal:  Negative for myalgias.  Neurological:  Positive for headaches. Negative for focal weakness and weakness.  Psychiatric/Behavioral:  Positive for depression and suicidal ideas. The patient is nervous/anxious and has insomnia.   All other systems reviewed and are negative.   Past Medical History:  Diagnosis Date   Borderline personality disorder (HCC)    Constipation 05/15/2015   Depression    DMDD (disruptive mood dysregulation disorder) 05/15/2015   History of seasonal allergies 05/15/2015   Hx of gastroesophageal reflux (GERD) 05/15/2015   Hx of seizure disorder 05/15/2015   Intellectual disability 05/22/2015   PTSD (post-traumatic stress disorder)    Seizures (HCC)    last one in 2015   Suicidal ideation     History reviewed. No pertinent surgical history.   reports that she has quit smoking. Her smoking use included cigarettes. She has never used smokeless  tobacco. She reports that she does not drink alcohol and does not use drugs.  Allergies  Allergen Reactions   Ritalin [Methylphenidate Hcl] Other (See Comments)    seizures   Abilify [Aripiprazole] Other (See Comments)    Shaking or  tremors   Lithium      History reviewed. No pertinent family history.  Prior to Admission medications   Medication Sig Start Date End Date Taking? Authorizing Provider  apixaban  (ELIQUIS ) 5 MG TABS tablet Take 5 mg by mouth 2 (two) times daily.   Yes [provider]  benztropine  (COGENTIN ) 1 MG tablet Take 1 mg by mouth 2 (two) times daily. 06/29/23  Yes [provider]  cloNIDine  (CATAPRES ) 0.1 MG tablet Take 0.1 mg by mouth 2 (two) times daily. 12/01/23  Yes [provider]  divalproex  (DEPAKOTE ) 500 MG DR tablet Take 1,000 mg by mouth 2 (two) times daily. 12/01/23  Yes [provider]  escitalopram  (LEXAPRO ) 10 MG tablet Take 10 mg by mouth daily.   Yes [provider]  folic acid  (FOLVITE ) 1 MG tablet Take 1 mg by mouth daily.   Yes [provider]  hydrochlorothiazide  (HYDRODIURIL ) 25 MG tablet Take 25 mg by mouth daily.   Yes [provider]  levETIRAcetam  (KEPPRA ) 500 MG tablet Take 500 mg by mouth 2 (two) times daily. 12/12/16  Yes [provider]  levothyroxine  (SYNTHROID ) 50 MCG tablet Take 50 mcg by mouth daily before breakfast.   Yes [provider]  loperamide  (IMODIUM  A-D) 2 MG tablet Take 1 tablet (2 mg total) by mouth 4 (four) times daily as needed for diarrhea or loose stools. 05/29/23  Yes Bradler, Evan K, MD  LORazepam  (ATIVAN ) 1 MG tablet Take 1 mg by mouth 3 (three) times daily. 04/25/23  Yes [provider]  meclizine  (ANTIVERT ) 25 MG tablet Take 25 mg by mouth 3 (three) times daily as needed for nausea.   Yes [provider]  metoprolol  succinate (TOPROL -XL) 50 MG 24 hr tablet Take 50 mg by mouth daily. 04/10/23  Yes [provider]  Multiple Vitamins-Minerals (MULTIVITAMIN ADULTS) TABS Take 1 tablet by mouth daily.   Yes [provider]  omeprazole (PRILOSEC) 40 MG capsule Take 40 mg by mouth daily. 01/24/23  Yes [provider]  prazosin  (MINIPRESS ) 2  MG capsule Take 2 mg by mouth at bedtime. 04/10/23  Yes [provider]  QUEtiapine  (SEROQUEL  XR) 50 MG TB24 24 hr tablet Take 50 mg by mouth at bedtime. 12/01/23  Yes [provider]  trazodone  (DESYREL ) 300 MG tablet Take 300 mg by mouth at bedtime. 04/15/23  Yes [provider]  UZEDY  100 MG/0.28ML syringe Inject 100 mg into the skin every 30 (thirty) days. 11/30/23  Yes [provider]  VENTOLIN  HFA 108 (90 Base) MCG/ACT inhaler Inhale 2 puffs into the lungs every 6 (six) hours as needed for wheezing or shortness of breath. 01/25/23  Yes [provider]  Xanomeline-Trospium  Chloride 125-30 MG CAPS Take 1 capsule by mouth 2 (two) times daily. 08/07/23  Yes [provider]    Physical Exam: Vitals:   12/16/23 1735 12/16/23 2120 12/17/23 0744 12/17/23 1123  BP: 98/63 (!) 89/53 98/71 106/64  Pulse: 89 79 79 77  Resp: 17 17 (!) 21   Temp: 99.7 F (37.6 C) (!) 97.5 F (36.4 C) 98.6 F (37 C)   TempSrc: Oral Oral    SpO2: 92% (!) 89% (!) 89% 93%  Weight:  Height:        Physical Exam Vitals and nursing note reviewed.  Constitutional:      General: She is sleeping.     Appearance: She is morbidly obese. She is not ill-appearing.  HENT:     Head: Normocephalic.  Eyes:     Pupils: Pupils are equal, round, and reactive to light.  Cardiovascular:     Rate and Rhythm: Normal rate and regular rhythm.     Pulses: Normal pulses.     Heart sounds: Normal heart sounds. No murmur heard.    No friction rub. No gallop.  Pulmonary:     Effort: Pulmonary effort is normal.     Breath sounds: Rhonchi present.  Abdominal:     Palpations: Abdomen is soft.  Musculoskeletal:        General: Tenderness present. No swelling.     Comments: Chest pain is reproducible on exam  Skin:    General: Skin is warm and dry.     Capillary Refill: Capillary refill takes less than 2 seconds.  Neurological:     General: No focal deficit present.      Mental Status: She is oriented to person, place, and time and easily aroused.  Psychiatric:        Mood and Affect: Mood is not depressed.        Speech: Speech normal.        Behavior: Behavior is cooperative.     Labs on Admission: I have personally reviewed following labs and imaging studies  CBC: Recent Labs  Lab 12/14/23 1538 12/16/23 1328 12/17/23 1016  WBC 9.9 10.9* 7.7  NEUTROABS  --   --  5.3  HGB 12.6 11.2* 12.0  HCT 40.6 36.9 39.2  MCV 78.4* 80.9 80.7  PLT 196 170 153   Basic Metabolic Panel: Recent Labs  Lab 12/14/23 1538 12/16/23 1328 12/17/23 1146  NA 136 138 138  K 3.7 3.5 3.9  CL 98 99 101  CO2 27 30 30   GLUCOSE 151* 125* 117*  BUN 11 9 8   CREATININE 0.95 0.92 0.90  CALCIUM 9.1 8.8* 8.3*   GFR: Estimated Creatinine Clearance: 131.6 mL/min (by C-G formula based on SCr of 0.9 mg/dL). Liver Function Tests: Recent Labs  Lab 12/14/23 1538  AST 15  ALT 8  ALKPHOS 64  BILITOT 0.3  PROT 7.4  ALBUMIN 3.4*   No results for input(s): LIPASE, AMYLASE in the last 168 hours. No results for input(s): AMMONIA in the last 168 hours. Coagulation Profile: No results for input(s): INR, PROTIME in the last 168 hours. Cardiac Enzymes: No results for input(s): CKTOTAL, CKMB, CKMBINDEX, TROPONINI, TROPONINIHS in the last 168 hours. BNP (last 3 results) No results for input(s): BNP in the last 8760 hours. HbA1C: No results for input(s): HGBA1C in the last 72 hours. CBG: No results for input(s): GLUCAP in the last 168 hours. Lipid Profile: No results for input(s): CHOL, HDL, LDLCALC, TRIG, CHOLHDL, LDLDIRECT in the last 72 hours. Thyroid Function Tests: No results for input(s): TSH, T4TOTAL, FREET4, T3FREE, THYROIDAB in the last 72 hours. Anemia Panel: No results for input(s): VITAMINB12, FOLATE, FERRITIN, TIBC, IRON, RETICCTPCT in the last 72 hours. Urine analysis:    Component Value Date/Time    COLORURINE STRAW (A) 05/31/2023 1525   APPEARANCEUR CLEAR (A) 05/31/2023 1525   LABSPEC 1.012 05/31/2023 1525   PHURINE 5.0 05/31/2023 1525   GLUCOSEU NEGATIVE 05/31/2023 1525   HGBUR NEGATIVE 05/31/2023 1525   BILIRUBINUR NEGATIVE 05/31/2023 1525  KETONESUR NEGATIVE 05/31/2023 1525   PROTEINUR NEGATIVE 05/31/2023 1525   NITRITE NEGATIVE 05/31/2023 1525   LEUKOCYTESUR NEGATIVE 05/31/2023 1525    Radiological Exams on Admission: I have personally reviewed images CT Angio Chest PE W/Cm &/Or Wo Cm Result Date: 12/17/2023 EXAM: CTA of the Chest without and with contrast for PE 12/17/2023 10:52:44 AM TECHNIQUE: CTA of the chest was performed without and with the administration of 75 mL of iohexol  (OMNIPAQUE ) 350 MG/ML intravenous contrast. Multiplanar reformatted images are provided for review. MIP images are provided for review. Automated exposure control, iterative reconstruction, and/or weight based adjustment of the mA/kV was utilized to reduce the radiation dose to as low as reasonably achievable. COMPARISON: None available. CLINICAL HISTORY: Pulmonary embolism (PE) suspected, high prob. FINDINGS: PULMONARY ARTERIES: Pulmonary arteries are adequately opacified for evaluation. No pulmonary embolism. Main pulmonary artery is normal in caliber. MEDIASTINUM: The heart and pericardium demonstrate no acute abnormality. There is no acute abnormality of the thoracic aorta. LYMPH NODES: No mediastinal, hilar or axillary lymphadenopathy. LUNGS AND PLEURA: There are ground glass and streaky opacities present within the lower lobes bilaterally. The nondependent lungs are relatively clear. No pleural effusion or pneumothorax. UPPER ABDOMEN: Limited images of the upper abdomen are unremarkable. SOFT TISSUES AND BONES: No acute bone or soft tissue abnormality. IMPRESSION: 1. No pulmonary embolism. 2. Ground-glass and streaky opacities in the lower lobes bilaterally, which may reflect atelectasis, aspiration, or  atypical/viral pneumonia; correlate clinically. Electronically signed by: Evalene Coho MD 12/17/2023 11:00 AM EST RP Workstation: [id number]   CT Head Wo Contrast Result Date: 12/17/2023 EXAM: CT HEAD WITHOUT CONTRAST 12/17/2023 10:52:44 AM TECHNIQUE: CT of the head was performed without the administration of intravenous contrast. Automated exposure control, iterative reconstruction, and/or weight based adjustment of the mA/kV was utilized to reduce the radiation dose to as low as reasonably achievable. COMPARISON: 06/16/2023 CLINICAL HISTORY: headache FINDINGS: BRAIN AND VENTRICLES: No acute hemorrhage. No evidence of acute infarct. No hydrocephalus. No extra-axial collection. No mass effect or midline shift. ORBITS: No acute abnormality. SINUSES: Mucosal thickening and air-fluid levels in bilateral maxillary and ethmoid sinuses and left sphenoid sinus. SOFT TISSUES AND SKULL: No acute soft tissue abnormality. No skull fracture. IMPRESSION: 1. No acute intracranial abnormality. 2. Mucosal thickening and air-fluid levels in bilateral maxillary and ethmoid sinuses and left sphenoid sinus. Electronically signed by: Evalene Coho MD 12/17/2023 10:56 AM EST RP Workstation: [id number]   DG Chest 1 View Result Date: 12/16/2023 EXAM: 1 VIEW(S) XRAY OF THE CHEST 12/16/2023 01:51:45 PM COMPARISON: 04/29/2023 CLINICAL HISTORY: SOB (shortness of breath) FINDINGS: LUNGS AND PLEURA: L.ow lung volumes. No focal pulmonary opacity. No pleural effusion. No pneumothorax. HEART AND MEDIASTINUM: No acute abnormality of the cardiac and mediastinal silhouettes. BONES AND SOFT TISSUES: No acute osseous abnormality. IMPRESSION: 1. No acute cardiopulmonary process identified. Electronically signed by: Waddell Calk MD 12/16/2023 01:58 PM EST RP Workstation: [id number]    EKG: My personal interpretation of EKG shows: Sinus tachycardia, heart rate 105, no conduction abnormalities    Assessment/Plan Principal Problem:    Sepsis due to pneumonia [facility]) Active Problems:   Acute hypoxic respiratory failure (HCC)   Suicidal ideations   Hx of seizure disorder   Essential hypertension   Hypothyroidism   Hx of gastroesophageal reflux (GERD)   Schizoaffective disorder (HCC)   PTSD (post-traumatic stress disorder)   Depression   Intellectual disability   Morbid obesity (HCC)    Assessment and Plan: * Sepsis due to pneumonia (HCC) WBC mildly elevated 10.9  yesterday and now normal at 7.7, SpO2 89%, lactic acid 2.5, BP 89/53 with MAP of 63.  Hypotension could be due to recent administrative of antihypertensives and not sepsis physiology - IV Rocephin  and Zithromax  - IV Fluids --> NS at 100 mL/h - Follow Blood, Sputum cultures - Follow Fever curve - Check PCT - Duonebs - Add on Sputum culture and 20 Pathogen panel - Mucinex  - Pulmonary toilet: Mobilize/incentive spirometry/flutter valve  - Urine studies: Legionella, Strep Antigen    Acute hypoxic respiratory failure (HCC) - Treat as above -Supplemental O2 as needed to maintain SpO2 greater than 90%, wean as able  Suicidal ideations - Consult inpatient psych, appreciate their recommendations and management - 1:1 sitter  Hx of seizure disorder - Continue home Keppra  and Depakote   Essential hypertension - Hold home hydrochlorothiazide , metoprolol , clonidine , and prazosin  in setting of hypotension  Hypothyroidism - Continue home Synthroid   Hx of gastroesophageal reflux (GERD) - Protonix   Schizoaffective disorder (HCC) - Continue home Cogentin  and Cobenfy    PTSD (post-traumatic stress disorder) - Hold home clonidine  and prazosin   Depression - Continue home Lexapro , Seroquel , trazodone   Intellectual disability - Patient has legal guardian for decision making  Morbid obesity (HCC) Body mass index is 52.18 kg/m.  Meets criteria for morbid obesity based on the presence of 1 or more chronic comorbidities. This complicates overall care and  prognosis.       VTE prophylaxis:  Eliquis   GI prophylaxis: Protonix  Diet: Heart healthy Access: PIV Lines: None Code Status:  Full Code HCDM: Legal guardian Sherry Bryan Telemetry: Yes  Admission status: Inpatient, Telemetry bed Patient is from: Group home Anticipated d/c is to: Group home or inpatient psychiatric facility Anticipated d/c date is: 1 to 2 days Patient currently: Receiving IV antibiotics, IV fluids, currently on supplemental O2, and awaiting inpatient psychiatric consultation   Family Communication: Legal guardian Sherry Bryan called at [phone], no answer.  HIPAA compliant voicemail left  Consults called: Psychiatry   Severity of Illness: The appropriate patient status for this patient is INPATIENT. Inpatient status is judged to be reasonable and necessary in order to provide the required intensity of service to ensure the patient's safety. The patient's presenting symptoms, physical exam findings, and initial radiographic and laboratory data in the context of their chronic comorbidities is felt to place them at high risk for further clinical deterioration. Furthermore, it is not anticipated that the patient will be medically stable for discharge from the hospital within 2 midnights of admission.   * I certify that at the point of admission it is my clinical judgment that the patient will require inpatient hospital care spanning beyond 2 midnights from the point of admission due to high intensity of service, high risk for further deterioration and high frequency of surveillance required.*  To reach the provider On-Call:   7AM- 7PM see care teams to locate the attending and reach out to them via [email or web address] Password: [email or web address] 7PM-7AM contact night-coverage If you still have difficulty reaching the appropriate provider, please page the [facility] (Director on Call) for [facility] on amion for assistance  This document was prepared using Software engineer and may include unintentional dictation errors.  Rockie Rams FNP-BC, PMHNP-BC Nurse Practitioner [facility]

## 2023-12-17 NOTE — Assessment & Plan Note (Signed)
-   Consult inpatient psych, appreciate their recommendations and management - 1:1 sitter

## 2023-12-17 NOTE — ED Notes (Signed)
 Pt hypotensive according to previous vital sign assessment. Vital signs being assessed at this time.

## 2023-12-17 NOTE — Assessment & Plan Note (Signed)
 Protonix.  ?

## 2023-12-17 NOTE — Assessment & Plan Note (Signed)
-   Continue home Lexapro , Seroquel , trazodone

## 2023-12-17 NOTE — Assessment & Plan Note (Signed)
-   Hold home clonidine  and prazosin

## 2023-12-17 NOTE — Hospital Course (Addendum)
 Sherry Bryan is a 28 y.o. year old female with past medical history of hypertension, hypothyroidism, seizure disorder, GERD, prior blood clots on Eliquis , major depressive disorder, DMDD, intellectual disability, borderline personality disorder, PTSD, and adjustment disorder.  She presented to [facility] regional ED  with reports of suicidal ideation and productive cough.  While pending psychiatric evaluation, she was found to have productive cough with green sputum, dyspnea, and chest pain associated with coughing for about 1 week.    ED Course: The patient was noted to be febrile temp 38.6 C, BP 98/71 and as low as 89/53 with MAP of 63, RR 21, SpO2 89% on room air and improving to 93% on 4 L via nasal cannula. CT head obtained and shows no acute intracranial abnormality with mucosal thickening and air-fluid levels in bilateral maxillary ethmoid sinuses and left sphenoid sinus.  CTA PE obtained and negative for PE, did show ground glass and streaky opacities in the lower lobes bilaterally. Labs notable for leukocytosis WBC 10.9 yesterday on ED arrival however improved to 7.7 this morning, lactic acid mildly elevated at 2.5. She was given Tylenol , Rocephin , doxycycline , and home meds including antihypertensives.   She was found to have a rhinovirus infection.

## 2023-12-17 NOTE — ED Notes (Signed)
Patient given snack.  

## 2023-12-17 NOTE — Assessment & Plan Note (Signed)
 Continue home Synthroid

## 2023-12-17 NOTE — Assessment & Plan Note (Signed)
-   Hold home hydrochlorothiazide , metoprolol , clonidine , and prazosin  in setting of hypotension

## 2023-12-17 NOTE — BH Assessment (Signed)
 Writer called group home, [facility] [phone]. No answer and unable to leave a message due to having a full mailbox.

## 2023-12-17 NOTE — Assessment & Plan Note (Signed)
-   Treat as above -Supplemental O2 as needed to maintain SpO2 greater than 90%, wean as able

## 2023-12-18 LAB — RESPIRATORY PANEL BY PCR

## 2023-12-18 LAB — BASIC METABOLIC PANEL WITH GFR
Anion gap: 8 (ref 5–15)
BUN: 6 mg/dL (ref 6–20)
CO2: 26 mmol/L (ref 22–32)
Calcium: 7.7 mg/dL — ABNORMAL LOW (ref 8.9–10.3)
Chloride: 109 mmol/L (ref 98–111)
Creatinine, Ser: 0.73 mg/dL (ref 0.44–1.00)
GFR, Estimated: >60 mL/min (ref >60)
Glucose, Bld: 101 mg/dL — ABNORMAL HIGH (ref 70–99)
Potassium: 3.7 mmol/L (ref 3.5–5.1)
Sodium: 142 mmol/L (ref 135–145)

## 2023-12-18 LAB — CBC
HCT: 37.6 % (ref 36.0–46.0)
Hemoglobin: 11.2 g/dL — ABNORMAL LOW (ref 12.0–15.0)
MCH: 24.8 pg — ABNORMAL LOW (ref 26.0–34.0)
MCHC: 29.8 g/dL — ABNORMAL LOW (ref 30.0–36.0)
MCV: 83.4 fL (ref 80.0–100.0)
Platelets: 127 K/uL — ABNORMAL LOW (ref 150–400)
RBC: 4.51 MIL/uL (ref 3.87–5.11)
RDW: 22.8 % — ABNORMAL HIGH (ref 11.5–15.5)
WBC: 7.6 K/uL (ref 4.0–10.5)
nRBC: 0.3 % — ABNORMAL HIGH (ref 0.0–0.2)

## 2023-12-18 MED ORDER — LEVOTHYROXINE SODIUM 50 MCG PO TABS
50.0000 ug | ORAL_TABLET | Freq: Every day | ORAL | Status: DC
  Administered 2023-12-19 – 2024-01-05 (×17): 50 ug via ORAL
  Filled 2023-12-18 (×18): qty 1

## 2023-12-18 NOTE — ED Notes (Signed)
 Pt refused dinner tray

## 2023-12-18 NOTE — ED Notes (Signed)
 Pt vomiting at this time. Pt given new paper scrubs and bed cleaned.

## 2023-12-18 NOTE — ED Notes (Signed)
 Pt ABCs intact. RR even and unlabored. Pt in NAD. Bed in lowest locked position. Call bell in reach. Denies needs at this time.    Past Medical History:  Diagnosis Date   Borderline personality disorder (HCC)    Constipation 05/15/2015   Depression    DMDD (disruptive mood dysregulation disorder) 05/15/2015   History of seasonal allergies 05/15/2015   Hx of gastroesophageal reflux (GERD) 05/15/2015   Hx of seizure disorder 05/15/2015   Intellectual disability 05/22/2015   PTSD (post-traumatic stress disorder)    Seizures (HCC)    last one in 2015   Suicidal ideation

## 2023-12-18 NOTE — ED Notes (Signed)
 Breakfast tray provided for pt.

## 2023-12-18 NOTE — ED Notes (Signed)
 Pt refused lunch tray

## 2023-12-18 NOTE — ED Notes (Signed)
 Vol/consult done/pending medical admission

## 2023-12-18 NOTE — Progress Notes (Addendum)
 PROGRESS NOTE    Sherry Bryan  FMW:[id number] DOB: 1995/08/16 DOA: 12/16/2023 PCP: Pcp, No   Brief Narrative:   Sherry Bryan is a 28 y.o. year old female with past medical history of hypertension, hypothyroidism, seizure disorder, GERD, prior blood clots on Eliquis , major depressive disorder, DMDD, intellectual disability, borderline personality disorder, PTSD, and adjustment disorder.  She presented to [facility] ED yesterday with reports of suicidal ideation and productive cough.  Ultimately she was medically cleared and she remained in the ED voluntary status pending psychiatric evaluation.  This morning she reports that she has had a productive cough with green sputum, dyspnea, and chest pain associated with coughing for about 1 week.  She has not had any known sick contacts.     ED Course: This morning patient was noted to be afebrile temp 38.6 C, BP 98/71 and as low as 89/53 with MAP of 63, RR 21, SpO2 89% on room air and improving to 93% on 4 L via nasal cannula. CT head obtained and shows no acute intracranial abnormality with mucosal thickening and air-fluid levels in bilateral maxillary ethmoid sinuses and left sphenoid sinus.  CTA PE obtained and negative for PE, did show ground glass and streaky opacities in the lower lobes bilaterally. Labs notable for leukocytosis WBC 10.9 yesterday on ED arrival however improved to 7.7 this morning, lactic acid mildly elevated at 2.5, blood cultures obtained and in process.  BMP ordered not collected for this morning yesterday BMP grossly normal with mild hyperglycemia with glucose 125 and calcium 8.8.  COVID flu and RSV negative yesterday.  She was given Tylenol , Rocephin , doxycycline , and home meds including antihypertensives.  Patient was evaluated by telepsychiatry while in the ED who found patient to be at moderate risk of self-harm in the current environment and plan for reassessment this morning for final psychiatric disposition  recommendations.   Admitted by TRH.   Assessment & Plan:   Principal Problem:   Sepsis due to pneumonia [facility]) Active Problems:   Hx of seizure disorder   Hx of gastroesophageal reflux (GERD)   Intellectual disability   Suicidal ideations   Depression   Acute hypoxic respiratory failure (HCC)   Hypothyroidism   Essential hypertension   Morbid obesity (HCC)   Schizoaffective disorder (HCC)   PTSD (post-traumatic stress disorder)   * Sepsis due to pneumonia [facility] Patient hypotensive with elevated lactic acid, hypoxia, CT showing bilateral lower lobe infiltrate concerning for pneumonia/atelectasis/aspiration -Viral panel positive for rhinovirus/enterovirus, procalcitonin is less than 0.10 -Will follow-up on blood culture, sputum culture if able -Continue ceftriaxone  and azithromycin  for now.   - Supportive care.   Acute hypoxic respiratory failure (HCC) - Treat as above -Supplemental O2 as needed to maintain SpO2 greater than 90%, wean as able   Suicidal ideations - Patient currently has no suicidal ideations.  Evaluated by psych.  Needs outpatient follow-up.  They have signed off.  Hx of seizure disorder - Continue home Keppra  and Depakote    Essential hypertension - Hold home hydrochlorothiazide , metoprolol , clonidine , and prazosin  in setting of hypotension   Hypothyroidism - Continue home Synthroid    Hx of gastroesophageal reflux (GERD) - Protonix    Schizoaffective disorder (HCC) - Continue home Cogentin  and Cobenfy      PTSD (post-traumatic stress disorder) - Hold home clonidine  and prazosin    Depression - Continue home Lexapro , Seroquel , trazodone    Intellectual disability - Patient has legal guardian for decision making   Morbid obesity (HCC) Body mass index is 52.18 kg/m.  Meets criteria for morbid obesity based on the presence of 1 or more chronic comorbidities. This complicates overall care and prognosis.            VTE prophylaxis:   Eliquis   GI prophylaxis: Protonix  Diet: Heart healthy Access: PIV Lines: None Code Status:  Full Code HCDM: Legal guardian Lorrin Soil Scientist: Yes             Admission status: Inpatient, Telemetry bed Patient is from: Group home Anticipated d/c is to: Group home or inpatient psychiatric facility Anticipated d/c date is: 1 to 2 days Patient currently: Receiving IV antibiotics, IV fluids, currently on supplemental O2, and awaiting inpatient psychiatric consultation     Family Communication:   Consults called: Psychiatry     Severity of Illness: The appropriate patient status for this patient is INPATIENT. Inpatient status is judged to be reasonable and necessary in order to provide the required intensity of service to ensure the patient's safety. The patient's presenting symptoms, physical exam findings, and initial radiographic and laboratory data in the context of their chronic comorbidities is felt to place them at high risk for further clinical deterioration. Furthermore, it is not anticipated that the patient will be medically stable for discharge from the hospital within 2 midnights of admission.    * I certify that at the point of admission it is my clinical judgment that the patient will require inpatient hospital care spanning beyond 2 midnights from the point of admission due to high intensity of service, high risk for further deterioration and high frequency of surveillance required.*   Subjective:  Patient seen and examined at the bedside.  She is somnolent, lungs coarse crackles bilaterally, she is arousable but not able to maintain sustained conversation.  She says she has sepsis from pneumonia.  Vital signs are stable.  Objective: Vitals:   12/18/23 0854 12/18/23 0900 12/18/23 0930 12/18/23 1252  BP: 119/80   111/64  Pulse: 98 89 88 91  Resp: 17 (!) 21 (!) 21 18  Temp: 98 F (36.7 C)   98.7 F (37.1 C)  TempSrc: Oral   Oral  SpO2: 90% 92% 92% 93%  Weight:       Height:        Intake/Output Summary (Last 24 hours) at 12/18/2023 1411 Last data filed at 12/18/2023 9094 Gross per 24 hour  Intake 100 ml  Output --  Net 100 ml   Filed Weights   12/16/23 1322  Weight: (!) 137.9 kg    Examination:  Physical Exam Vitals and nursing note reviewed.  Constitutional:      General: Somnolent, ill-appearing   Cardiovascular:     Rate and Rhythm: Normal rate and regular rhythm.     Pulses: Normal pulses.     Heart sounds: Normal heart sounds. No murmur heard.    No friction rub. No gallop.  Pulmonary:     Effort: Pulmonary effort is normal.     Breath sounds: Rhonchi present.  Abdominal:     Palpations: Abdomen is soft, non tender Skin:    General: Skin is warm and dry.     Capillary Refill: Capillary refill takes less than 2 seconds.  Neurological:     General: No focal deficit present.     Mental Status: She is oriented to person, place, and time and easily aroused.  Psychiatric:        Mood and Affect: Somnolent.    Speech: Speech normal.  Behavior: Behavior is cooperative.   Data Reviewed: I have personally reviewed following labs and imaging studies  CBC: Recent Labs  Lab 12/14/23 1538 12/16/23 1328 12/17/23 1016 12/18/23 0827  WBC 9.9 10.9* 7.7 7.6  NEUTROABS  --   --  5.3  --   HGB 12.6 11.2* 12.0 11.2*  HCT 40.6 36.9 39.2 37.6  MCV 78.4* 80.9 80.7 83.4  PLT 196 170 153 127*   Basic Metabolic Panel: Recent Labs  Lab 12/14/23 1538 12/16/23 1328 12/17/23 1146 12/18/23 0827  NA 136 138 138 142  K 3.7 3.5 3.9 3.7  CL 98 99 101 109  CO2 27 30 30 26   GLUCOSE 151* 125* 117* 101*  BUN 11 9 8 6   CREATININE 0.95 0.92 0.90 0.73  CALCIUM 9.1 8.8* 8.3* 7.7*   GFR: Estimated Creatinine Clearance: 148 mL/min (by C-G formula based on SCr of 0.73 mg/dL). Liver Function Tests: Recent Labs  Lab 12/14/23 1538  AST 15  ALT 8  ALKPHOS 64  BILITOT 0.3  PROT 7.4  ALBUMIN 3.4*   No results for input(s): LIPASE,  AMYLASE in the last 168 hours. No results for input(s): AMMONIA in the last 168 hours. Coagulation Profile: No results for input(s): INR, PROTIME in the last 168 hours. Cardiac Enzymes: No results for input(s): CKTOTAL, CKMB, CKMBINDEX, TROPONINI in the last 168 hours. BNP (last 3 results) No results for input(s): PROBNP in the last 8760 hours. HbA1C: No results for input(s): HGBA1C in the last 72 hours. CBG: No results for input(s): GLUCAP in the last 168 hours. Lipid Profile: No results for input(s): CHOL, HDL, LDLCALC, TRIG, CHOLHDL, LDLDIRECT in the last 72 hours. Thyroid Function Tests: No results for input(s): TSH, T4TOTAL, FREET4, T3FREE, THYROIDAB in the last 72 hours. Anemia Panel: No results for input(s): VITAMINB12, FOLATE, FERRITIN, TIBC, IRON, RETICCTPCT in the last 72 hours. Sepsis Labs: Recent Labs  Lab 12/17/23 1016 12/17/23 1146 12/17/23 1240  PROCALCITON  --   --  <0.10  LATICACIDVEN 2.5* 1.6  --     Recent Results (from the past 240 hours)  Resp panel by RT-PCR (RSV, Flu A&B, Covid) Anterior Nasal Swab     Status: None   Collection Time: 12/16/23  1:28 PM   Specimen: Anterior Nasal Swab  Result Value Ref Range Status   SARS Coronavirus 2 by RT PCR NEGATIVE NEGATIVE Final    Comment: (NOTE) SARS-CoV-2 target nucleic acids are NOT DETECTED.  The SARS-CoV-2 RNA is generally detectable in upper respiratory specimens during the acute phase of infection. The lowest concentration of SARS-CoV-2 viral copies this assay can detect is 138 copies/mL. A negative result does not preclude SARS-Cov-2 infection and should not be used as the sole basis for treatment or other patient management decisions. A negative result may occur with  improper specimen collection/handling, submission of specimen other than nasopharyngeal swab, presence of viral mutation(s) within the areas targeted by this assay, and inadequate  number of viral copies(<138 copies/mL). A negative result must be combined with clinical observations, patient history, and epidemiological information. The expected result is Negative.  Fact Sheet for Patients:  bloggercourse.com  Fact Sheet for Healthcare Providers:  seriousbroker.it  This test is no t yet approved or cleared by the [place]  FDA and  has been authorized for detection and/or diagnosis of SARS-CoV-2 by FDA under an Emergency Use Authorization (EUA). This EUA will remain  in effect (meaning this test can be used) for the duration of the COVID-19 declaration under  Section 564(b)(1) of the Act, 21 U.S.C.section 360bbb-3(b)(1), unless the authorization is terminated  or revoked sooner.       Influenza A by PCR NEGATIVE NEGATIVE Final   Influenza B by PCR NEGATIVE NEGATIVE Final    Comment: (NOTE) The Xpert Xpress SARS-CoV-2/FLU/RSV plus assay is intended as an aid in the diagnosis of influenza from Nasopharyngeal swab specimens and should not be used as a sole basis for treatment. Nasal washings and aspirates are unacceptable for Xpert Xpress SARS-CoV-2/FLU/RSV testing.  Fact Sheet for Patients: bloggercourse.com  Fact Sheet for Healthcare Providers: seriousbroker.it  This test is not yet approved or cleared by the [place]  FDA and has been authorized for detection and/or diagnosis of SARS-CoV-2 by FDA under an Emergency Use Authorization (EUA). This EUA will remain in effect (meaning this test can be used) for the duration of the COVID-19 declaration under Section 564(b)(1) of the Act, 21 U.S.C. section 360bbb-3(b)(1), unless the authorization is terminated or revoked.     Resp Syncytial Virus by PCR NEGATIVE NEGATIVE Final    Comment: (NOTE) Fact Sheet for Patients: bloggercourse.com  Fact Sheet for Healthcare  Providers: seriousbroker.it  This test is not yet approved or cleared by the [place]  FDA and has been authorized for detection and/or diagnosis of SARS-CoV-2 by FDA under an Emergency Use Authorization (EUA). This EUA will remain in effect (meaning this test can be used) for the duration of the COVID-19 declaration under Section 564(b)(1) of the Act, 21 U.S.C. section 360bbb-3(b)(1), unless the authorization is terminated or revoked.  Performed at [facility], [address] [place], [place] [address]   Culture, blood (routine x 2)     Status: None (Preliminary result)   Collection Time: 12/17/23 10:16 AM   Specimen: BLOOD  Result Value Ref Range Status   Specimen Description BLOOD BLOOD RIGHT HAND  Final   Special Requests   Final    BOTTLES DRAWN AEROBIC AND ANAEROBIC Blood Culture adequate volume   Culture   Final    NO GROWTH < 24 HOURS Performed at [facility], [address] [place], [place] [address]    Report Status PENDING  Incomplete  Culture, blood (routine x 2)     Status: None (Preliminary result)   Collection Time: 12/17/23 10:16 AM   Specimen: BLOOD  Result Value Ref Range Status   Specimen Description BLOOD BLOOD LEFT ARM  Final   Special Requests   Final    BOTTLES DRAWN AEROBIC AND ANAEROBIC Blood Culture adequate volume   Culture   Final    NO GROWTH < 24 HOURS Performed at [facility], [address] [place], [place] [address]    Report Status PENDING  Incomplete  Respiratory (~20 pathogens) panel by PCR     Status: Abnormal   Collection Time: 12/17/23 12:41 PM   Specimen: Nasopharyngeal Swab; Respiratory  Result Value Ref Range Status   Adenovirus NOT DETECTED NOT DETECTED Final   Coronavirus 229E NOT DETECTED NOT DETECTED Final    Comment: (NOTE) The Coronavirus on the Respiratory Panel, DOES NOT test for the novel  Coronavirus (2019 nCoV)    Coronavirus HKU1 NOT DETECTED NOT  DETECTED Final   Coronavirus NL63 NOT DETECTED NOT DETECTED Final   Coronavirus OC43 NOT DETECTED NOT DETECTED Final   Metapneumovirus NOT DETECTED NOT DETECTED Final   Rhinovirus / Enterovirus DETECTED (A) NOT DETECTED Final   Influenza A NOT DETECTED NOT DETECTED Final   Influenza B NOT DETECTED NOT  DETECTED Final   Parainfluenza Virus 1 NOT DETECTED NOT DETECTED Final   Parainfluenza Virus 2 NOT DETECTED NOT DETECTED Final   Parainfluenza Virus 3 NOT DETECTED NOT DETECTED Final   Parainfluenza Virus 4 NOT DETECTED NOT DETECTED Final   Respiratory Syncytial Virus NOT DETECTED NOT DETECTED Final   Bordetella pertussis NOT DETECTED NOT DETECTED Final   Bordetella Parapertussis NOT DETECTED NOT DETECTED Final   Chlamydophila pneumoniae NOT DETECTED NOT DETECTED Final   Mycoplasma pneumoniae NOT DETECTED NOT DETECTED Final    Comment: Performed at [facility], [address], [place], [place] [address]         Radiology Studies: CT Angio Chest PE W/Cm &/Or Wo Cm Result Date: 12/17/2023 EXAM: CTA of the Chest without and with contrast for PE 12/17/2023 10:52:44 AM TECHNIQUE: CTA of the chest was performed without and with the administration of 75 mL of iohexol  (OMNIPAQUE ) 350 MG/ML intravenous contrast. Multiplanar reformatted images are provided for review. MIP images are provided for review. Automated exposure control, iterative reconstruction, and/or weight based adjustment of the mA/kV was utilized to reduce the radiation dose to as low as reasonably achievable. COMPARISON: None available. CLINICAL HISTORY: Pulmonary embolism (PE) suspected, high prob. FINDINGS: PULMONARY ARTERIES: Pulmonary arteries are adequately opacified for evaluation. No pulmonary embolism. Main pulmonary artery is normal in caliber. MEDIASTINUM: The heart and pericardium demonstrate no acute abnormality. There is no acute abnormality of the thoracic aorta. LYMPH NODES: No mediastinal, hilar or axillary  lymphadenopathy. LUNGS AND PLEURA: There are ground glass and streaky opacities present within the lower lobes bilaterally. The nondependent lungs are relatively clear. No pleural effusion or pneumothorax. UPPER ABDOMEN: Limited images of the upper abdomen are unremarkable. SOFT TISSUES AND BONES: No acute bone or soft tissue abnormality. IMPRESSION: 1. No pulmonary embolism. 2. Ground-glass and streaky opacities in the lower lobes bilaterally, which may reflect atelectasis, aspiration, or atypical/viral pneumonia; correlate clinically. Electronically signed by: Evalene Coho MD 12/17/2023 11:00 AM EST RP Workstation: [id number]   CT Head Wo Contrast Result Date: 12/17/2023 EXAM: CT HEAD WITHOUT CONTRAST 12/17/2023 10:52:44 AM TECHNIQUE: CT of the head was performed without the administration of intravenous contrast. Automated exposure control, iterative reconstruction, and/or weight based adjustment of the mA/kV was utilized to reduce the radiation dose to as low as reasonably achievable. COMPARISON: 06/16/2023 CLINICAL HISTORY: headache FINDINGS: BRAIN AND VENTRICLES: No acute hemorrhage. No evidence of acute infarct. No hydrocephalus. No extra-axial collection. No mass effect or midline shift. ORBITS: No acute abnormality. SINUSES: Mucosal thickening and air-fluid levels in bilateral maxillary and ethmoid sinuses and left sphenoid sinus. SOFT TISSUES AND SKULL: No acute soft tissue abnormality. No skull fracture. IMPRESSION: 1. No acute intracranial abnormality. 2. Mucosal thickening and air-fluid levels in bilateral maxillary and ethmoid sinuses and left sphenoid sinus. Electronically signed by: Timothy Berrigan MD 12/17/2023 10:56 AM EST RP Workstation: [id number]        Scheduled Meds:  apixaban   5 mg Oral BID   benztropine   1 mg Oral BID   divalproex   1,000 mg Oral BID   escitalopram   10 mg Oral Daily   folic acid   1 mg Oral Daily   guaiFENesin   600 mg Oral BID   levETIRAcetam   500 mg Oral  BID   levothyroxine   50 mcg Oral QAC breakfast   LORazepam   1 mg Oral TID   pantoprazole   80 mg Oral Daily   QUEtiapine   50 mg Oral QHS   trazodone   300 mg Oral  QHS   Xanomeline-Trospium  Chloride  1 capsule Oral BID   Continuous Infusions:  azithromycin  500 mg (12/18/23 1129)   cefTRIAXone  (ROCEPHIN )  IV Stopped (12/18/23 9094)          Derryl Duval, MD [facility] 12/18/2023, 2:11 PM

## 2023-12-18 NOTE — ED Notes (Signed)
 VOL  Medical admit

## 2023-12-19 ENCOUNTER — Ambulatory Visit: Payer: MEDICAID | Admitting: Family Medicine

## 2023-12-19 MED ORDER — AZITHROMYCIN 500 MG PO TABS: 500.0000 mg | ORAL_TABLET | Freq: Every day | ORAL | Status: DC

## 2023-12-19 NOTE — Progress Notes (Signed)
 PHARMACIST - PHYSICIAN COMMUNICATION DR:   TRH CONCERNING: Antibiotic IV to Oral Route Change Policy  RECOMMENDATION: This patient is receiving azithromycin  by the intravenous route.  Based on criteria approved by the Pharmacy and Therapeutics Committee, the antibiotic(s) is/are being converted to the equivalent oral dose form(s).   DESCRIPTION: These criteria include: Patient being treated for a respiratory tract infection, urinary tract infection, cellulitis or clostridium difficile associated diarrhea if on metronidazole The patient is not neutropenic and does not exhibit a GI malabsorption state The patient is eating (either orally or via tube) and/or has been taking other orally administered medications for a least 24 hours The patient is improving clinically and has a Tmax < 100.5  If you have questions about this conversion, please contact the Pharmacy Department  []   [phone] )  Sherry Bryan [x]   [phone] )  Sherry Bryan []   [phone] )  Sherry Bryan []   [phone] )  Sherry Bryan []   [phone] )  Sherry Bryan   Celestine Slovak, PharmD, [place], [place] Work Cell: [phone] 12/19/2023 9:45 AM

## 2023-12-19 NOTE — Plan of Care (Signed)
  Problem: Health Behavior/Discharge Planning: Goal: Ability to manage health-related needs will improve Outcome: Not Progressing   Problem: Clinical Measurements: Goal: Ability to maintain clinical measurements within normal limits will improve Outcome: Not Progressing   

## 2023-12-19 NOTE — Progress Notes (Addendum)
 PROGRESS NOTE    Sherry Bryan  FMW:[id number] DOB: 10-26-1995 DOA: 12/16/2023 PCP: Pcp, No   Brief Narrative:   Sherry Bryan is a 28 y.o. year old female with past medical history of hypertension, hypothyroidism, seizure disorder, GERD, prior blood clots on Eliquis , major depressive disorder, DMDD, intellectual disability, borderline personality disorder, PTSD, and adjustment disorder.  She presented to [facility] ED yesterday with reports of suicidal ideation and productive cough.  Ultimately she was medically cleared and she remained in the ED voluntary status pending psychiatric evaluation.  This morning she reports that she has had a productive cough with green sputum, dyspnea, and chest pain associated with coughing for about 1 week.  She has not had any known sick contacts.     ED Course: This morning patient was noted to be afebrile temp 38.6 C, BP 98/71 and as low as 89/53 with MAP of 63, RR 21, SpO2 89% on room air and improving to 93% on 4 L via nasal cannula. CT head obtained and shows no acute intracranial abnormality with mucosal thickening and air-fluid levels in bilateral maxillary ethmoid sinuses and left sphenoid sinus.  CTA PE obtained and negative for PE, did show ground glass and streaky opacities in the lower lobes bilaterally. Labs notable for leukocytosis WBC 10.9 yesterday on ED arrival however improved to 7.7 this morning, lactic acid mildly elevated at 2.5, blood cultures obtained and in process.  BMP ordered not collected for this morning yesterday BMP grossly normal with mild hyperglycemia with glucose 125 and calcium 8.8.  COVID flu and RSV negative yesterday.  She was given Tylenol , Rocephin , doxycycline , and home meds including antihypertensives.  Patient was evaluated by telepsychiatry while in the ED who found patient to be at moderate risk of self-harm in the current environment and plan for reassessment this morning for final psychiatric disposition  recommendations.   Admitted by TRH.   Assessment & Plan:   Principal Problem:   Sepsis due to pneumonia [facility]) Active Problems:   Hx of seizure disorder   Hx of gastroesophageal reflux (GERD)   Intellectual disability   Suicidal ideations   Depression   Acute hypoxic respiratory failure (HCC)   Hypothyroidism   Essential hypertension   Morbid obesity (HCC)   Schizoaffective disorder (HCC)   PTSD (post-traumatic stress disorder)   * Sepsis due to pneumonia [facility]) Patient hypotensive with elevated lactic acid, hypoxia, CT showing bilateral lower lobe infiltrate concerning for pneumonia/atelectasis/aspiration -Viral panel positive for rhinovirus/enterovirus, procalcitonin is less than 0.10 -Will follow-up on blood culture, sputum culture if able -Continue ceftriaxone  and azithromycin  for now.   - Supportive care.   Acute hypoxic respiratory failure (HCC) 12/9 -- still requiring 4 >> 6 L/min [place] O2 with detats into 80's on 4 L - Treat as above -Supplemental O2 as needed to maintain SpO2 greater than 90%, wean as able   Suicidal ideations - Patient currently has no suicidal ideations.  Evaluated by psych.  Needs outpatient follow-up.  They have signed off.  Hx of seizure disorder - Continue home Keppra  and Depakote    Essential hypertension - Hold home hydrochlorothiazide , metoprolol , clonidine , and prazosin  in setting of hypotension   Hypothyroidism - Continue home Synthroid    Hx of gastroesophageal reflux (GERD) - Protonix    Schizoaffective disorder (HCC) - Continue home Cogentin  and Cobenfy      PTSD (post-traumatic stress disorder) - Hold home clonidine  and prazosin    Depression - Continue home Lexapro , Seroquel , trazodone    Intellectual disability - Patient  has legal guardian for decision making   Morbid obesity (HCC) Body mass index is 52.18 kg/m.   Meets criteria for morbid obesity based on the presence of 1 or more chronic comorbidities. This  complicates overall care and prognosis.            VTE prophylaxis:  Eliquis   GI prophylaxis: Protonix  Diet: Heart healthy Access: PIV Lines: None Code Status:  Full Code HCDM: Legal guardian Sherry Bryan: Yes             Admission status: Inpatient, Telemetry bed Patient is from: Group home Anticipated d/c is to: Group home or inpatient psychiatric facility Anticipated d/c date is: 1 to 2 days Patient currently: Receiving IV antibiotics, IV fluids, currently on supplemental O2, and awaiting inpatient psychiatric consultation     Family Communication:   none present  Consults called: Psychiatry       Subjective:  Patient seen with RN at bedside this AM.  Pt had O2 out of nose, blowing her nose, then drifted to sleep without O2 in place.  Rn replaced it and reports pt continually removes it during sleep apparently.  Pt denies shortness of berath of fever/chills.  Overall says she is feeling better slowly.  Objective: Vitals:   12/19/23 0303 12/19/23 0307 12/19/23 0435 12/19/23 0725  BP:   120/78 122/81  Pulse:  (!) 126 (!) 121 (!) 118  Resp:   (!) 22 18  Temp:   99.9 F (37.7 C) 98.4 F (36.9 C)  TempSrc:   Oral   SpO2: 93% 92% 92% 93%  Weight:      Height:        Intake/Output Summary (Last 24 hours) at 12/19/2023 1317 Last data filed at 12/18/2023 1904 Gross per 24 hour  Intake 1000 ml  Output --  Net 1000 ml   Filed Weights   12/16/23 1322  Weight: (!) 137.9 kg    Examination:   General exam: awake, drowsy falls asleep during encounter, no acute distress, obese HEENT: moist mucus membranes, hearing grossly normal  Respiratory system: CTAB, intermittent rhonchi vs upper airway sounds, normal respiratory effort at rest on 3 L/min [place] O2. Cardiovascular system: normal S1/S2, RRR Gastrointestinal system: soft, NT, ND Central nervous system: A&O x 2+. no gross focal neurologic deficits, normal speech Extremities: moves all, no edema, normal  tone Skin: dry, intact, normal temperature Psychiatry: normal mood, flat affect   Data Reviewed: I have personally reviewed following labs and imaging studies  CBC: Recent Labs  Lab 12/14/23 1538 12/16/23 1328 12/17/23 1016 12/18/23 0827  WBC 9.9 10.9* 7.7 7.6  NEUTROABS  --   --  5.3  --   HGB 12.6 11.2* 12.0 11.2*  HCT 40.6 36.9 39.2 37.6  MCV 78.4* 80.9 80.7 83.4  PLT 196 170 153 127*   Basic Metabolic Panel: Recent Labs  Lab 12/14/23 1538 12/16/23 1328 12/17/23 1146 12/18/23 0827  NA 136 138 138 142  K 3.7 3.5 3.9 3.7  CL 98 99 101 109  CO2 27 30 30 26   GLUCOSE 151* 125* 117* 101*  BUN 11 9 8 6   CREATININE 0.95 0.92 0.90 0.73  CALCIUM 9.1 8.8* 8.3* 7.7*   GFR: Estimated Creatinine Clearance: 148 mL/min (by C-G formula based on SCr of 0.73 mg/dL). Liver Function Tests: Recent Labs  Lab 12/14/23 1538  AST 15  ALT 8  ALKPHOS 64  BILITOT 0.3  PROT 7.4  ALBUMIN 3.4*   No results for input(s): LIPASE, AMYLASE  in the last 168 hours. No results for input(s): AMMONIA in the last 168 hours. Coagulation Profile: No results for input(s): INR, PROTIME in the last 168 hours. Cardiac Enzymes: No results for input(s): CKTOTAL, CKMB, CKMBINDEX, TROPONINI in the last 168 hours. BNP (last 3 results) No results for input(s): PROBNP in the last 8760 hours. HbA1C: No results for input(s): HGBA1C in the last 72 hours. CBG: No results for input(s): GLUCAP in the last 168 hours. Lipid Profile: No results for input(s): CHOL, HDL, LDLCALC, TRIG, CHOLHDL, LDLDIRECT in the last 72 hours. Thyroid Function Tests: No results for input(s): TSH, T4TOTAL, FREET4, T3FREE, THYROIDAB in the last 72 hours. Anemia Panel: No results for input(s): VITAMINB12, FOLATE, FERRITIN, TIBC, IRON, RETICCTPCT in the last 72 hours. Sepsis Labs: Recent Labs  Lab 12/17/23 1016 12/17/23 1146 12/17/23 1240  PROCALCITON  --   --  <0.10   LATICACIDVEN 2.5* 1.6  --     Recent Results (from the past 240 hours)  Resp panel by RT-PCR (RSV, Flu A&B, Covid) Anterior Nasal Swab     Status: None   Collection Time: 12/16/23  1:28 PM   Specimen: Anterior Nasal Swab  Result Value Ref Range Status   SARS Coronavirus 2 by RT PCR NEGATIVE NEGATIVE Final    Comment: (NOTE) SARS-CoV-2 target nucleic acids are NOT DETECTED.  The SARS-CoV-2 RNA is generally detectable in upper respiratory specimens during the acute phase of infection. The lowest concentration of SARS-CoV-2 viral copies this assay can detect is 138 copies/mL. A negative result does not preclude SARS-Cov-2 infection and should not be used as the sole basis for treatment or other patient management decisions. A negative result may occur with  improper specimen collection/handling, submission of specimen other than nasopharyngeal swab, presence of viral mutation(s) within the areas targeted by this assay, and inadequate number of viral copies(<138 copies/mL). A negative result must be combined with clinical observations, patient history, and epidemiological information. The expected result is Negative.  Fact Sheet for Patients:  bloggercourse.com  Fact Sheet for Healthcare Providers:  seriousbroker.it  This test is no t yet approved or cleared by the [place]  FDA and  has been authorized for detection and/or diagnosis of SARS-CoV-2 by FDA under an Emergency Use Authorization (EUA). This EUA will remain  in effect (meaning this test can be used) for the duration of the COVID-19 declaration under Section 564(b)(1) of the Act, 21 U.S.C.section 360bbb-3(b)(1), unless the authorization is terminated  or revoked sooner.       Influenza A by PCR NEGATIVE NEGATIVE Final   Influenza B by PCR NEGATIVE NEGATIVE Final    Comment: (NOTE) The Xpert Xpress SARS-CoV-2/FLU/RSV plus assay is intended as an aid in the  diagnosis of influenza from Nasopharyngeal swab specimens and should not be used as a sole basis for treatment. Nasal washings and aspirates are unacceptable for Xpert Xpress SARS-CoV-2/FLU/RSV testing.  Fact Sheet for Patients: bloggercourse.com  Fact Sheet for Healthcare Providers: seriousbroker.it  This test is not yet approved or cleared by the [place]  FDA and has been authorized for detection and/or diagnosis of SARS-CoV-2 by FDA under an Emergency Use Authorization (EUA). This EUA will remain in effect (meaning this test can be used) for the duration of the COVID-19 declaration under Section 564(b)(1) of the Act, 21 U.S.C. section 360bbb-3(b)(1), unless the authorization is terminated or revoked.     Resp Syncytial Virus by PCR NEGATIVE NEGATIVE Final    Comment: (NOTE) Fact Sheet for Patients: bloggercourse.com  Fact Sheet for Healthcare Providers: seriousbroker.it  This test is not yet approved or cleared by the [place]  FDA and has been authorized for detection and/or diagnosis of SARS-CoV-2 by FDA under an Emergency Use Authorization (EUA). This EUA will remain in effect (meaning this test can be used) for the duration of the COVID-19 declaration under Section 564(b)(1) of the Act, 21 U.S.C. section 360bbb-3(b)(1), unless the authorization is terminated or revoked.  Performed at [facility], [address] [place], [place] [address]   Culture, blood (routine x 2)     Status: None (Preliminary result)   Collection Time: 12/17/23 10:16 AM   Specimen: BLOOD  Result Value Ref Range Status   Specimen Description BLOOD BLOOD RIGHT HAND  Final   Special Requests   Final    BOTTLES DRAWN AEROBIC AND ANAEROBIC Blood Culture adequate volume   Culture   Final    NO GROWTH 2 DAYS Performed at [facility], [address], [place], [place]  [address]    Report Status PENDING  Incomplete  Culture, blood (routine x 2)     Status: None (Preliminary result)   Collection Time: 12/17/23 10:16 AM   Specimen: BLOOD  Result Value Ref Range Status   Specimen Description BLOOD BLOOD LEFT ARM  Final   Special Requests   Final    BOTTLES DRAWN AEROBIC AND ANAEROBIC Blood Culture adequate volume   Culture   Final    NO GROWTH 2 DAYS Performed at [facility], [address], [place], [place] [address]    Report Status PENDING  Incomplete  Respiratory (~20 pathogens) panel by PCR     Status: Abnormal   Collection Time: 12/17/23 12:41 PM   Specimen: Nasopharyngeal Swab; Respiratory  Result Value Ref Range Status   Adenovirus NOT DETECTED NOT DETECTED Final   Coronavirus 229E NOT DETECTED NOT DETECTED Final    Comment: (NOTE) The Coronavirus on the Respiratory Panel, DOES NOT test for the novel  Coronavirus (2019 nCoV)    Coronavirus HKU1 NOT DETECTED NOT DETECTED Final   Coronavirus NL63 NOT DETECTED NOT DETECTED Final   Coronavirus OC43 NOT DETECTED NOT DETECTED Final   Metapneumovirus NOT DETECTED NOT DETECTED Final   Rhinovirus / Enterovirus DETECTED (A) NOT DETECTED Final   Influenza A NOT DETECTED NOT DETECTED Final   Influenza B NOT DETECTED NOT DETECTED Final   Parainfluenza Virus 1 NOT DETECTED NOT DETECTED Final   Parainfluenza Virus 2 NOT DETECTED NOT DETECTED Final   Parainfluenza Virus 3 NOT DETECTED NOT DETECTED Final   Parainfluenza Virus 4 NOT DETECTED NOT DETECTED Final   Respiratory Syncytial Virus NOT DETECTED NOT DETECTED Final   Bordetella pertussis NOT DETECTED NOT DETECTED Final   Bordetella Parapertussis NOT DETECTED NOT DETECTED Final   Chlamydophila pneumoniae NOT DETECTED NOT DETECTED Final   Mycoplasma pneumoniae NOT DETECTED NOT DETECTED Final    Comment: Performed at [facility], [address], [place], [place] [address]         Radiology Studies: No results  found.       Scheduled Meds:  apixaban   5 mg Oral BID   [START ON 12/20/2023] azithromycin   500 mg Oral Daily   benztropine   1 mg Oral BID   divalproex   1,000 mg Oral BID   escitalopram   10 mg Oral Daily   folic acid   1 mg Oral Daily   guaiFENesin   600 mg Oral BID   levETIRAcetam   500 mg Oral  BID   levothyroxine   50 mcg Oral Q0600   LORazepam   1 mg Oral TID   pantoprazole   80 mg Oral Daily   QUEtiapine   50 mg Oral QHS   trazodone   300 mg Oral QHS   Xanomeline-Trospium  Chloride  1 capsule Oral BID   Continuous Infusions:  azithromycin  500 mg (12/19/23 1256)   cefTRIAXone  (ROCEPHIN )  IV 2 g (12/19/23 1123)          Burnard DELENA Cunning, DO [facility] 12/19/2023, 1:17 PM

## 2023-12-19 NOTE — Plan of Care (Signed)

## 2023-12-19 NOTE — Progress Notes (Signed)
 Pt HR elevated between 125-135 bpm. Pt O2 found to be 84% while sleeping on 4L . Respiratory notified and PRN Neb treatment administered. Pt placed on 6L HFNC. O2 improved to 92%. MD notified of elevated HR, instructed to continue to monitor at this time.

## 2023-12-20 DIAGNOSIS — J189 Pneumonia, unspecified organism: Secondary | ICD-10-CM

## 2023-12-20 DIAGNOSIS — A419 Sepsis, unspecified organism: Secondary | ICD-10-CM

## 2023-12-20 LAB — CBC
HCT: 35.5 % — ABNORMAL LOW (ref 36.0–46.0)
Hemoglobin: 10.6 g/dL — ABNORMAL LOW (ref 12.0–15.0)
MCH: 24.5 pg — ABNORMAL LOW (ref 26.0–34.0)
MCHC: 29.9 g/dL — ABNORMAL LOW (ref 30.0–36.0)
MCV: 82.2 fL (ref 80.0–100.0)
Platelets: 132 K/uL — ABNORMAL LOW (ref 150–400)
RBC: 4.32 MIL/uL (ref 3.87–5.11)
RDW: 21.9 % — ABNORMAL HIGH (ref 11.5–15.5)
WBC: 7.3 K/uL (ref 4.0–10.5)
nRBC: 0 % (ref 0.0–0.2)

## 2023-12-20 LAB — LEGIONELLA PNEUMOPHILA SEROGP 1 UR AG: L. pneumophila Serogp 1 Ur Ag: NEGATIVE

## 2023-12-20 LAB — BLOOD GAS, ARTERIAL
Acid-Base Excess: 8.4 mmol/L — ABNORMAL HIGH (ref 0.0–2.0)
Bicarbonate: 34.9 mmol/L — ABNORMAL HIGH (ref 20.0–28.0)
O2 Saturation: 99 %
Patient temperature: 37
pCO2 arterial: 55 mmHg — ABNORMAL HIGH (ref 32–48)
pH, Arterial: 7.41 (ref 7.35–7.45)
pO2, Arterial: 87 mmHg (ref 83–108)

## 2023-12-20 MED ORDER — DM-GUAIFENESIN ER 30-600 MG PO TB12
1.0000 | ORAL_TABLET | Freq: Two times a day (BID) | ORAL | Status: DC
  Administered 2023-12-20 – 2024-01-05 (×33): 1 via ORAL
  Filled 2023-12-20 (×33): qty 1

## 2023-12-20 NOTE — TOC CM/SW Note (Addendum)
 Transition of Care [facility]) CM/SW Note   Transition of Care [facility]) - Inpatient Brief Assessment   Patient Details  Name: Sherry Bryan MRN: [id number] Date of Birth: 1995-02-18  Transition of Care [facility]) CM/SW Contact:    Alvaro Louder, LCSW Phone Number: 12/20/2023, 9:26 AM   Clinical Narrative:  3:47 PM: Per adapt they are not seeing anything that qualifies patient for DME CPAP. She does not have a qualifying diagnosis.   Per Consult PCP resources placed in AVS. LCSWA to order DME CPAP for patient. No other TOC needs at this time. Please consult if they arise.  TOC to follow for discharge   Transition of Care Asessment: Insurance and Status: Insurance coverage has been reviewed Patient has primary care physician: No Home environment has been reviewed: Group Home Prior level of function:: Ox4 Prior/Current Home Services: No current home services Social Drivers of Health Review: SDOH reviewed needs interventions Readmission risk has been reviewed: Yes Transition of care needs: no transition of care needs at this time

## 2023-12-20 NOTE — Evaluation (Signed)
 Occupational Therapy Evaluation Patient Details Name: Sherry Bryan MRN: [id number] DOB: Nov 13, 1995 Today's Date: 12/20/2023   History of Present Illness   Pt is a 28 year old female admitted with sepsis due to pneumonia, acute hypoxic respiratory failure; of note, pt presented to ED with suicidal ideations, Patient currently has no suicidal ideations    PMH significant for  hypertension, hypothyroidism, seizure disorder, GERD, prior blood clots on Eliquis , major depressive disorder, DMDD, intellectual disability, borderline personality disorder, PTSD, and adjustment disorder     Clinical Impressions Chart reviewed to date, pt greeted semi supine in bed. She is initially lethargic, improved with mobility attempts. Pt is alert and oriented x4. Pt reports she lives in a group home where she has 24/7 supervision. She amb with no AD household distances, reports she does not get out much. She reports she performs ADLs with MOD I except for supervision with bathing. She has assist for IADLs. Pt presents with deficits in strength, endurance, activity tolerance, balance, cognition, affecting safe and optimal ADL completion. Supervision requried for bed mobility with initial poor sitting balance, progress to fair. STS and short amb transfer to bsc with CGA, toileting completed with supervision. Pt is performing ADL/functional mobility below PLOF, will benefit from acute OT to address functional deficits and to facilittae return to PLOF. Pt is left as received, nurse in room, all needs met. OT will follow.   Spo2 >90% on 6L via [place] throughout. HR to 120s bpm with mobility attempts. Pt does report dizziness with amb attempts, requests to return back to bed with BP 98/81. Notified nurse in room.    If plan is discharge home, recommend the following:   A little help with walking and/or transfers;A little help with bathing/dressing/bathroom;Supervision due to cognitive status;Direct supervision/assist for  financial management;Direct supervision/assist for medications management;Assistance with cooking/housework;Help with stairs or ramp for entrance     Functional Status Assessment   Patient has had a recent decline in their functional status and demonstrates the ability to make significant improvements in function in a reasonable and predictable amount of time.     Equipment Recommendations   BSC/3in1;Tub/shower seat (bariatric)     Recommendations for Other Services         Precautions/Restrictions   Precautions Precautions: Fall Recall of Precautions/Restrictions: Impaired     Mobility Bed Mobility Overal bed mobility: Needs Assistance Bed Mobility: Supine to Sit, Sit to Supine     Supine to sit: Contact guard, HOB elevated Sit to supine: Contact guard assist   General bed mobility comments: increased time, effort to bring BLE into bed    Transfers Overall transfer level: Needs assistance Equipment used: None Transfers: Sit to/from Stand Sit to Stand: Contact guard assist                  Balance Overall balance assessment: Needs assistance Sitting-balance support: Feet supported Sitting balance-Leahy Scale: Poor Sitting balance - Comments: poor with posterior lean, progress to fair Postural control: Posterior lean   Standing balance-Leahy Scale: Fair                             ADL either performed or assessed with clinical judgement   ADL Overall ADL's : Needs assistance/impaired     Grooming: Supervision/safety;Sitting               Lower Body Dressing: Independent Lower Body Dressing Details (indicate cue type and reason): donn socks Toilet Transfer:  Contact guard assist;Ambulation;BSC/3in1 Statistician Details (indicate cue type and reason): short amb transfer to bsc Toileting- Clothing Manipulation and Hygiene: Supervision/safety;Sitting/lateral lean Toileting - Clothing Manipulation Details (indicate cue type and  reason): continent urine on toilet     Functional mobility during ADLs: Contact guard assist (approx 4' in room)       Vision Patient Visual Report: No change from baseline Vision Assessment?: Yes Eye Alignment: Impaired (comment) (R eye laterally deviated) Ocular Range of Motion: Within Functional Limits Additional Comments: R eye with ?strabismus. Pt reports she wears glasses and does not have them here. Impaired direction following for optimal visual assessment/tracking will continue to assess, notified MD     Perception         Praxis         Pertinent Vitals/Pain Pain Assessment Pain Assessment: Faces Faces Pain Scale: Hurts a little bit Pain Location: L foot Pain Descriptors / Indicators: Discomfort Pain Intervention(s): Repositioned, Monitored during session     Extremity/Trunk Assessment Upper Extremity Assessment Upper Extremity Assessment: Overall WFL for tasks assessed   Lower Extremity Assessment Lower Extremity Assessment: Defer to PT evaluation       Communication Communication Communication: No apparent difficulties   Cognition Arousal: Lethargic Behavior During Therapy: Flat affect Cognition: History of cognitive impairments, Cognition impaired, No family/caregiver present to determine baseline   Orientation impairments: Time Awareness: Online awareness impaired Memory impairment (select all impairments): Declarative long-term memory Attention impairment (select first level of impairment): Sustained attention Executive functioning impairment (select all impairments): Reasoning, Problem solving OT - Cognition Comments: will continue to assess                 Following commands: Impaired Following commands impaired: Follows one step commands with increased time     Cueing  General Comments   Cueing Techniques: Verbal cues;Tactile cues;Visual cues  spo2 >90% on 6L via HFNC throughout, HR to 120s bpm with mobility; BP 98/81 post  mobiltiy   Exercises Other Exercises Other Exercises: edu re role of OT, role of rehab, discharge recommendations   Shoulder Instructions      Home Living Family/patient expects to be discharged to:: Group home Living Arrangements: Group Home Available Help at Discharge: Available 24 hours/day (staff) Type of Home: House Home Access: Stairs to enter [employer] of Steps: 5   Home Layout: One level     Bathroom Shower/Tub: Producer, Television/film/video: Standard     Home Equipment: None          Prior Functioning/Environment Prior Level of Function : Needs assist       Physical Assist : ADLs (physical)   ADLs (physical): IADLs;Bathing Mobility Comments: pt report she amb household distances with no AD ADLs Comments: pt reports she is MOD I-I in feeding, grooming, dressing, toileting; she has supervision for bathing and assist for IADLs including med management    OT Problem List: Decreased strength;Decreased activity tolerance;Impaired balance (sitting and/or standing);Decreased knowledge of use of DME or AE;Decreased safety awareness;Cardiopulmonary status limiting activity   OT Treatment/Interventions: Self-care/ADL training;Therapeutic exercise;Energy conservation;DME and/or AE instruction;Patient/family education;Visual/perceptual remediation/compensation;Therapeutic activities;Balance training      OT Goals(Current goals can be found in the care plan section)   Acute Rehab OT Goals Patient Stated Goal: feel stronger OT Goal Formulation: With patient Time For Goal Achievement: 01/03/24 Potential to Achieve Goals: Good ADL Goals Pt Will Perform Grooming: with modified independence;sitting;standing Pt Will Perform Lower Body Dressing: with modified independence;sitting/lateral leans;sit to/from stand Pt  Will Transfer to Toilet: with modified independence;ambulating Pt Will Perform Toileting - Clothing Manipulation and hygiene: with modified  independence;sitting/lateral leans;sit to/from stand   OT Frequency:  Min 2X/week    Co-evaluation              AM-PAC OT 6 Clicks Daily Activity     Outcome Measure Help from another person eating meals?: None Help from another person taking care of personal grooming?: None Help from another person toileting, which includes using toliet, bedpan, or urinal?: None Help from another person bathing (including washing, rinsing, drying)?: A Little Help from another person to put on and taking off regular upper body clothing?: None Help from another person to put on and taking off regular lower body clothing?: A Lot 6 Click Score: 21   End of Session Equipment Utilized During Treatment: Oxygen Nurse Communication: Mobility status  Activity Tolerance: Patient limited by lethargy Patient left: in bed;with call bell/phone within reach;with bed alarm set;with nursing/sitter in room  OT Visit Diagnosis: Other abnormalities of gait and mobility (R26.89)                Time: 8647-8582 OT Time Calculation (min): 25 min Charges:  OT General Charges $OT Visit: 1 Visit OT Evaluation $OT Eval Moderate Complexity: 1 Mod  Therisa Sheffield, OTD OTR/L  12/20/23, 4:03 PM

## 2023-12-20 NOTE — Progress Notes (Signed)
 PROGRESS NOTE    Sherry Bryan  FMW:[id number] DOB: 1995/01/31 DOA: 12/16/2023 PCP: Pcp, No   Brief Narrative:   Sherry Bryan is a 28 y.o. year old female with past medical history of hypertension, hypothyroidism, seizure disorder, GERD, prior blood clots on Eliquis , major depressive disorder, DMDD, intellectual disability, borderline personality disorder, PTSD, and adjustment disorder.  She presented to [facility] ED yesterday with reports of suicidal ideation and productive cough.  Ultimately she was medically cleared and she remained in the ED voluntary status pending psychiatric evaluation.  This morning she reports that she has had a productive cough with green sputum, dyspnea, and chest pain associated with coughing for about 1 week.  She has not had any known sick contacts.     ED Course: This morning patient was noted to be afebrile temp 38.6 C, BP 98/71 and as low as 89/53 with MAP of 63, RR 21, SpO2 89% on room air and improving to 93% on 4 L via nasal cannula. CT head obtained and shows no acute intracranial abnormality with mucosal thickening and air-fluid levels in bilateral maxillary ethmoid sinuses and left sphenoid sinus.  CTA PE obtained and negative for PE, did show ground glass and streaky opacities in the lower lobes bilaterally. Labs notable for leukocytosis WBC 10.9 yesterday on ED arrival however improved to 7.7 this morning, lactic acid mildly elevated at 2.5, blood cultures obtained and in process.  BMP ordered not collected for this morning yesterday BMP grossly normal with mild hyperglycemia with glucose 125 and calcium 8.8.  COVID flu and RSV negative yesterday.  She was given Tylenol , Rocephin , doxycycline , and home meds including antihypertensives.  Patient was evaluated by telepsychiatry while in the ED who found patient to be at moderate risk of self-harm in the current environment and plan for reassessment this morning for final psychiatric disposition  recommendations.   Admitted by TRH.   Assessment & Plan:   Principal Problem:   Sepsis due to pneumonia [facility]) Active Problems:   Hx of seizure disorder   Hx of gastroesophageal reflux (GERD)   Intellectual disability   Suicidal ideations   Depression   Acute hypoxic respiratory failure (HCC)   Hypothyroidism   Essential hypertension   Morbid obesity (HCC)   Schizoaffective disorder (HCC)   PTSD (post-traumatic stress disorder)   * Sepsis due to pneumonia (HCC) Sepsis physiology resolved.  Patient hypotensive with elevated lactic acid, hypoxia, CT showing bilateral lower lobe infiltrate concerning for pneumonia/atelectasis/aspiration -Viral panel positive for rhinovirus/enterovirus, procalcitonin is less than 0.10 - Blood cultures no growth at 3 days, sputum cultures pending -Will complete treatment for CAP as well.  - Supportive care.   Acute hypoxic respiratory failure (HCC) 12/10 still requiring 4- 6 L/min [place] O2 with detats into 80's on 4 L - Treat as above -Supplemental O2 as needed to maintain SpO2 greater than 90%, wean as able   Suicidal ideations Resolved  - Patient currently has no suicidal ideations.  Evaluated by psych.  Needs outpatient follow-up.  They have signed off.  Hx of seizure disorder - Continue home Keppra  and Depakote    Essential hypertension - Hold home hydrochlorothiazide , metoprolol , clonidine , and prazosin  in setting of hypotension   Hypothyroidism - Continue home Synthroid    Hx of gastroesophageal reflux (GERD) - Protonix    Schizoaffective disorder (HCC) - Continue home Cogentin  and Cobenfy    OSA  Reportedly with OSA. States she is unable to afford CPAP as she briefly was uninsured.   PTSD (post-traumatic  stress disorder) - Hold home clonidine  and prazosin    Depression - Continue home Lexapro , Seroquel , trazodone    Intellectual disability - Patient has legal guardian for decision making   Morbid obesity (HCC) Body mass  index is 52.18 kg/m.   Meets criteria for morbid obesity based on the presence of 1 or more chronic comorbidities. This complicates overall care and prognosis.       VTE prophylaxis:  Eliquis   GI prophylaxis: Protonix  Diet: Heart healthy Access: PIV Lines: None Code Status:  Full Code HCDM: Legal guardian Lorrin Soil Scientist: Yes             Admission status: Inpatient, Telemetry bed Patient is from: Group home Anticipated d/c is to: Group home or inpatient psychiatric facility Anticipated d/c date is: 1 to 2 days Patient currently: Receiving IV antibiotics, IV fluids, currently on supplemental O2, and awaiting inpatient psychiatric consultation     Family Communication:   none present  Consults called: Psychiatry       Subjective:  No issues overnight.   Objective: Vitals:   12/19/23 2240 12/20/23 0319 12/20/23 0429 12/20/23 0832  BP:  91/68 115/82 (!) 131/92  Pulse:  92 77 91  Resp: 18 18 19 18   Temp:  98.7 F (37.1 C) 98.6 F (37 C) 98.2 F (36.8 C)  TempSrc:      SpO2:  99% 98% 95%  Weight:      Height:        Intake/Output Summary (Last 24 hours) at 12/20/2023 0845 Last data filed at 12/20/2023 0200 Gross per 24 hour  Intake 600 ml  Output --  Net 600 ml   Filed Weights   12/16/23 1322  Weight: (!) 137.9 kg    Examination:   Physical Exam  Constitutional: In no distress.  Cardiovascular: Normal rate, regular rhythm. No lower extremity edema  Pulmonary: Non labored breathing on [place] no wheezing or rales though breath sounds are distant given body habitus.    Abdominal: Soft. Non distended and non tender Musculoskeletal: Normal range of motion.     Neurological: Alert and oriented to person, place, and time. Non focal  Skin: Skin is warm and dry.     Data Reviewed: I have personally reviewed following labs and imaging studies  CBC: Recent Labs  Lab 12/14/23 1538 12/16/23 1328 12/17/23 1016 12/18/23 0827 12/20/23 0548  WBC 9.9  10.9* 7.7 7.6 7.3  NEUTROABS  --   --  5.3  --   --   HGB 12.6 11.2* 12.0 11.2* 10.6*  HCT 40.6 36.9 39.2 37.6 35.5*  MCV 78.4* 80.9 80.7 83.4 82.2  PLT 196 170 153 127* 132*   Basic Metabolic Panel: Recent Labs  Lab 12/14/23 1538 12/16/23 1328 12/17/23 1146 12/18/23 0827  NA 136 138 138 142  K 3.7 3.5 3.9 3.7  CL 98 99 101 109  CO2 27 30 30 26   GLUCOSE 151* 125* 117* 101*  BUN 11 9 8 6   CREATININE 0.95 0.92 0.90 0.73  CALCIUM 9.1 8.8* 8.3* 7.7*   GFR: Estimated Creatinine Clearance: 148 mL/min (by C-G formula based on SCr of 0.73 mg/dL). Liver Function Tests: Recent Labs  Lab 12/14/23 1538  AST 15  ALT 8  ALKPHOS 64  BILITOT 0.3  PROT 7.4  ALBUMIN 3.4*   No results for input(s): LIPASE, AMYLASE in the last 168 hours. No results for input(s): AMMONIA in the last 168 hours. Coagulation Profile: No results for input(s): INR, PROTIME in the last 168  hours. Cardiac Enzymes: No results for input(s): CKTOTAL, CKMB, CKMBINDEX, TROPONINI in the last 168 hours. BNP (last 3 results) No results for input(s): PROBNP in the last 8760 hours. HbA1C: No results for input(s): HGBA1C in the last 72 hours. CBG: No results for input(s): GLUCAP in the last 168 hours. Lipid Profile: No results for input(s): CHOL, HDL, LDLCALC, TRIG, CHOLHDL, LDLDIRECT in the last 72 hours. Thyroid Function Tests: No results for input(s): TSH, T4TOTAL, FREET4, T3FREE, THYROIDAB in the last 72 hours. Anemia Panel: No results for input(s): VITAMINB12, FOLATE, FERRITIN, TIBC, IRON, RETICCTPCT in the last 72 hours. Sepsis Labs: Recent Labs  Lab 12/17/23 1016 12/17/23 1146 12/17/23 1240  PROCALCITON  --   --  <0.10  LATICACIDVEN 2.5* 1.6  --     Recent Results (from the past 240 hours)  Resp panel by RT-PCR (RSV, Flu A&B, Covid) Anterior Nasal Swab     Status: None   Collection Time: 12/16/23  1:28 PM   Specimen: Anterior Nasal Swab   Result Value Ref Range Status   SARS Coronavirus 2 by RT PCR NEGATIVE NEGATIVE Final    Comment: (NOTE) SARS-CoV-2 target nucleic acids are NOT DETECTED.  The SARS-CoV-2 RNA is generally detectable in upper respiratory specimens during the acute phase of infection. The lowest concentration of SARS-CoV-2 viral copies this assay can detect is 138 copies/mL. A negative result does not preclude SARS-Cov-2 infection and should not be used as the sole basis for treatment or other patient management decisions. A negative result may occur with  improper specimen collection/handling, submission of specimen other than nasopharyngeal swab, presence of viral mutation(s) within the areas targeted by this assay, and inadequate number of viral copies(<138 copies/mL). A negative result must be combined with clinical observations, patient history, and epidemiological information. The expected result is Negative.  Fact Sheet for Patients:  bloggercourse.com  Fact Sheet for Healthcare Providers:  seriousbroker.it  This test is no t yet approved or cleared by the [place]  FDA and  has been authorized for detection and/or diagnosis of SARS-CoV-2 by FDA under an Emergency Use Authorization (EUA). This EUA will remain  in effect (meaning this test can be used) for the duration of the COVID-19 declaration under Section 564(b)(1) of the Act, 21 U.S.C.section 360bbb-3(b)(1), unless the authorization is terminated  or revoked sooner.       Influenza A by PCR NEGATIVE NEGATIVE Final   Influenza B by PCR NEGATIVE NEGATIVE Final    Comment: (NOTE) The Xpert Xpress SARS-CoV-2/FLU/RSV plus assay is intended as an aid in the diagnosis of influenza from Nasopharyngeal swab specimens and should not be used as a sole basis for treatment. Nasal washings and aspirates are unacceptable for Xpert Xpress SARS-CoV-2/FLU/RSV testing.  Fact Sheet for  Patients: bloggercourse.com  Fact Sheet for Healthcare Providers: seriousbroker.it  This test is not yet approved or cleared by the [place]  FDA and has been authorized for detection and/or diagnosis of SARS-CoV-2 by FDA under an Emergency Use Authorization (EUA). This EUA will remain in effect (meaning this test can be used) for the duration of the COVID-19 declaration under Section 564(b)(1) of the Act, 21 U.S.C. section 360bbb-3(b)(1), unless the authorization is terminated or revoked.     Resp Syncytial Virus by PCR NEGATIVE NEGATIVE Final    Comment: (NOTE) Fact Sheet for Patients: bloggercourse.com  Fact Sheet for Healthcare Providers: seriousbroker.it  This test is not yet approved or cleared by the [place]  FDA and has been authorized for detection and/or  diagnosis of SARS-CoV-2 by FDA under an Emergency Use Authorization (EUA). This EUA will remain in effect (meaning this test can be used) for the duration of the COVID-19 declaration under Section 564(b)(1) of the Act, 21 U.S.C. section 360bbb-3(b)(1), unless the authorization is terminated or revoked.  Performed at [facility], [address] [place], [place] [address]   Culture, blood (routine x 2)     Status: None (Preliminary result)   Collection Time: 12/17/23 10:16 AM   Specimen: BLOOD  Result Value Ref Range Status   Specimen Description BLOOD BLOOD RIGHT HAND  Final   Special Requests   Final    BOTTLES DRAWN AEROBIC AND ANAEROBIC Blood Culture adequate volume   Culture   Final    NO GROWTH 3 DAYS Performed at [facility], [address], [place], [place] [address]    Report Status PENDING  Incomplete  Culture, blood (routine x 2)     Status: None (Preliminary result)   Collection Time: 12/17/23 10:16 AM   Specimen: BLOOD  Result Value Ref Range Status   Specimen  Description BLOOD BLOOD LEFT ARM  Final   Special Requests   Final    BOTTLES DRAWN AEROBIC AND ANAEROBIC Blood Culture adequate volume   Culture   Final    NO GROWTH 3 DAYS Performed at [facility], [address], [place], [place] [address]    Report Status PENDING  Incomplete  Respiratory (~20 pathogens) panel by PCR     Status: Abnormal   Collection Time: 12/17/23 12:41 PM   Specimen: Nasopharyngeal Swab; Respiratory  Result Value Ref Range Status   Adenovirus NOT DETECTED NOT DETECTED Final   Coronavirus 229E NOT DETECTED NOT DETECTED Final    Comment: (NOTE) The Coronavirus on the Respiratory Panel, DOES NOT test for the novel  Coronavirus (2019 nCoV)    Coronavirus HKU1 NOT DETECTED NOT DETECTED Final   Coronavirus NL63 NOT DETECTED NOT DETECTED Final   Coronavirus OC43 NOT DETECTED NOT DETECTED Final   Metapneumovirus NOT DETECTED NOT DETECTED Final   Rhinovirus / Enterovirus DETECTED (A) NOT DETECTED Final   Influenza A NOT DETECTED NOT DETECTED Final   Influenza B NOT DETECTED NOT DETECTED Final   Parainfluenza Virus 1 NOT DETECTED NOT DETECTED Final   Parainfluenza Virus 2 NOT DETECTED NOT DETECTED Final   Parainfluenza Virus 3 NOT DETECTED NOT DETECTED Final   Parainfluenza Virus 4 NOT DETECTED NOT DETECTED Final   Respiratory Syncytial Virus NOT DETECTED NOT DETECTED Final   Bordetella pertussis NOT DETECTED NOT DETECTED Final   Bordetella Parapertussis NOT DETECTED NOT DETECTED Final   Chlamydophila pneumoniae NOT DETECTED NOT DETECTED Final   Mycoplasma pneumoniae NOT DETECTED NOT DETECTED Final    Comment: Performed at [facility], [address] [place], [place] [address]         Radiology Studies: No results found.       Scheduled Meds:  apixaban   5 mg Oral BID   azithromycin   500 mg Oral Daily   benztropine   1 mg Oral BID   divalproex   1,000 mg Oral BID   escitalopram   10 mg Oral Daily   folic acid   1 mg Oral Daily    guaiFENesin   600 mg Oral BID   levETIRAcetam   500 mg Oral BID   levothyroxine   50 mcg Oral Q0600   LORazepam   1 mg Oral TID   pantoprazole   80 mg Oral Daily   QUEtiapine   50  mg Oral QHS   trazodone   300 mg Oral QHS   Xanomeline-Trospium  Chloride  1 capsule Oral BID   Continuous Infusions:  cefTRIAXone  (ROCEPHIN )  IV 2 g (12/19/23 1123)          Alban Pepper, MD [facility] 12/20/2023, 8:45 AM

## 2023-12-20 NOTE — Discharge Instructions (Signed)
 Some PCP options in [place] area- not a comprehensive list  [facility]- [phone] [facility]- [phone] [facility]- [phone] [facility]- [phone] [facility]- [phone] Nichole Molly- [phone]  or [facility] Physician Referral Line [phone]

## 2023-12-20 NOTE — Progress Notes (Signed)
 PT Cancellation Note  Patient Details Name: Sherry Bryan MRN: [id number] DOB: 01-18-1995   Cancelled Treatment:    Reason Eval/Treat Not Completed: Patient's level of consciousness (Attempted to evaluate twice: pt unable to stay awake on both attempts. Pt reports pain in legs, O2 donned. Will attempt again at later date/time.)  5:03 PM, 12/20/23 Peggye JAYSON Linear, PT, DPT Physical Therapist - [facility]  [phone] (ASCOM)    [place] 12/20/2023, 5:02 PM

## 2023-12-20 NOTE — Progress Notes (Signed)
 Patient has not had a BM since 12/8. Stool softener offered. Patient refused due to nausea and vomiting.

## 2023-12-21 LAB — BLOOD GAS, ARTERIAL
Acid-Base Excess: 7 mmol/L — ABNORMAL HIGH (ref 0.0–2.0)
Bicarbonate: 33.4 mmol/L — ABNORMAL HIGH (ref 20.0–28.0)
O2 Saturation: 99 %
Patient temperature: 37
pCO2 arterial: 54 mmHg — ABNORMAL HIGH (ref 32–48)
pH, Arterial: 7.4 (ref 7.35–7.45)
pO2, Arterial: 88 mmHg (ref 83–108)

## 2023-12-21 LAB — CBC
HCT: 36.6 % (ref 36.0–46.0)
Hemoglobin: 11.2 g/dL — ABNORMAL LOW (ref 12.0–15.0)
MCH: 24.7 pg — ABNORMAL LOW (ref 26.0–34.0)
MCHC: 30.6 g/dL (ref 30.0–36.0)
MCV: 80.6 fL (ref 80.0–100.0)
Platelets: 131 K/uL — ABNORMAL LOW (ref 150–400)
RBC: 4.54 MIL/uL (ref 3.87–5.11)
RDW: 21.5 % — ABNORMAL HIGH (ref 11.5–15.5)
WBC: 7 K/uL (ref 4.0–10.5)
nRBC: 0.3 % — ABNORMAL HIGH (ref 0.0–0.2)

## 2023-12-21 LAB — BASIC METABOLIC PANEL WITH GFR
Anion gap: 8 (ref 5–15)
BUN: 9 mg/dL (ref 6–20)
CO2: 29 mmol/L (ref 22–32)
Calcium: 9.1 mg/dL (ref 8.9–10.3)
Chloride: 102 mmol/L (ref 98–111)
Creatinine, Ser: 0.84 mg/dL (ref 0.44–1.00)
GFR, Estimated: >60 mL/min (ref >60)
Glucose, Bld: 81 mg/dL (ref 70–99)
Potassium: 4.3 mmol/L (ref 3.5–5.1)
Sodium: 139 mmol/L (ref 135–145)

## 2023-12-21 LAB — IRON AND TIBC
Iron: 29 ug/dL (ref 28–170)
Saturation Ratios: 7 % — ABNORMAL LOW (ref 10.4–31.8)
TIBC: 410 ug/dL (ref 250–450)
UIBC: 381 ug/dL

## 2023-12-21 LAB — GLUCOSE, CAPILLARY: Glucose-Capillary: 123 mg/dL — ABNORMAL HIGH (ref 70–99)

## 2023-12-21 LAB — FERRITIN: Ferritin: 17 ng/mL (ref 11–307)

## 2023-12-21 MED ORDER — XANOMELINE-TROSPIUM CHLORIDE 125-30 MG PO CAPS
1.0000 | ORAL_CAPSULE | Freq: Two times a day (BID) | ORAL | Status: DC
  Administered 2023-12-21 – 2023-12-27 (×13): 1 via ORAL
  Administered 2023-12-28: 09:00:00 125 mg via ORAL
  Administered 2023-12-28 – 2023-12-29 (×2): 1 via ORAL
  Administered 2023-12-29: 125 mg via ORAL
  Administered 2023-12-30 – 2024-01-02 (×8): 1 via ORAL
  Administered 2024-01-03: 125 mg via ORAL
  Administered 2024-01-03: 1 via ORAL
  Administered 2024-01-04: 125 mg via ORAL
  Administered 2024-01-04 – 2024-01-05 (×2): 1 via ORAL
  Filled 2023-12-21 (×39): qty 1

## 2023-12-21 MED ORDER — TRAZODONE HCL 100 MG PO TABS
200.0000 mg | ORAL_TABLET | Freq: Every day | ORAL | Status: DC
  Administered 2023-12-21: 200 mg via ORAL
  Filled 2023-12-21: qty 2

## 2023-12-21 MED ORDER — LACTATED RINGERS IV BOLUS
500.0000 mL | Freq: Once | INTRAVENOUS | Status: AC
  Administered 2023-12-21: 500 mL via INTRAVENOUS

## 2023-12-21 MED ORDER — IRON SUCROSE 300 MG IVPB - SIMPLE MED
300.0000 mg | Freq: Once | Status: AC
  Administered 2023-12-21: 300 mg via INTRAVENOUS
  Filled 2023-12-21: qty 300

## 2023-12-21 NOTE — Progress Notes (Addendum)
°  Chaplain On-Call responded to Rapid Response notification at 1412 hours.  The patient was being attended by the Medical Team in preparation for transfer to Unit 2-A, room 234.  Chaplain assured Staff of availability as needed.  Chaplain Bebe Ardean EMERSON Hershal., [facility]

## 2023-12-21 NOTE — Progress Notes (Addendum)
 Rapid response called at approx 1410.  HR-131  RR-20  BP 130/97 Sp02 89% on 7L.  ABG was drawn 7.4/54/88/33  pt is in no respiratory distress from what this RRT gathered  Pt was transferred to PCU and was placed on a HHFNC at 40L and 40%  pt stated to me that when she gets up she feels very dizzy.  Currently pt is resting in bed

## 2023-12-21 NOTE — Progress Notes (Signed)
 PROGRESS NOTE    Sherry Bryan  FMW:[id number] DOB: 07-13-1995 DOA: 12/16/2023 PCP: Pcp, No   Brief Narrative:   Sherry Bryan is a 28 y.o. year old female with past medical history of hypertension, hypothyroidism, seizure disorder, GERD, prior blood clots on Eliquis , major depressive disorder, DMDD, intellectual disability, borderline personality disorder, PTSD, and adjustment disorder.  She presented to [facility] ED yesterday with reports of suicidal ideation and productive cough.  Ultimately she was medically cleared and she remained in the ED voluntary status pending psychiatric evaluation.  This morning she reports that she has had a productive cough with green sputum, dyspnea, and chest pain associated with coughing for about 1 week.  She has not had any known sick contacts.     ED Course: This morning patient was noted to be afebrile temp 38.6 C, BP 98/71 and as low as 89/53 with MAP of 63, RR 21, SpO2 89% on room air and improving to 93% on 4 L via nasal cannula. CT head obtained and shows no acute intracranial abnormality with mucosal thickening and air-fluid levels in bilateral maxillary ethmoid sinuses and left sphenoid sinus.  CTA PE obtained and negative for PE, did show ground glass and streaky opacities in the lower lobes bilaterally. Labs notable for leukocytosis WBC 10.9 yesterday on ED arrival however improved to 7.7 this morning, lactic acid mildly elevated at 2.5, blood cultures obtained and in process.  BMP ordered not collected for this morning yesterday BMP grossly normal with mild hyperglycemia with glucose 125 and calcium 8.8.  COVID flu and RSV negative yesterday.  She was given Tylenol , Rocephin , doxycycline , and home meds including antihypertensives.  Patient was evaluated by telepsychiatry while in the ED who found patient to be at moderate risk of self-harm in the current environment and plan for reassessment this morning for final psychiatric disposition  recommendations.   Admitted by TRH.   Assessment & Plan:   Principal Problem:   Sepsis due to pneumonia [facility]) Active Problems:   Hx of seizure disorder   Hx of gastroesophageal reflux (GERD)   Intellectual disability   Suicidal ideations   Depression   Acute hypoxic respiratory failure (HCC)   Hypothyroidism   Essential hypertension   Morbid obesity (HCC)   Schizoaffective disorder (HCC)   PTSD (post-traumatic stress disorder)   * Sepsis due to pneumonia (HCC) Sepsis physiology resolved.  Patient hypotensive with elevated lactic acid, hypoxia, CT showing bilateral lower lobe infiltrate concerning for pneumonia/atelectasis/aspiration -Viral panel positive for rhinovirus/enterovirus, procalcitonin is less than 0.10 - Blood cultures no growth at 3 days, sputum cultures pending -Will complete treatment for CAP as well.  - Supportive care.   Acute hypoxic respiratory failure (HCC) Chronic hypercapnic RF  Likely OHS/OSA Continues to require 4 to 6 L of oxygen.  Patient had a syncopal episode when oxygen was removed and O2 sats in the 80s.  ABG showed normal pH with elevated pCO2 and normal O2 saturation on nonrebreather.  Suspect patient has a component of obesity hypoventilation syndrome and oxygenation may be worse with recent pneumonia. -Supplemental O2 as needed to maintain SpO2 greater than 90%, wean as able   Suicidal ideations Resolved  - Patient currently has no suicidal ideations.  Evaluated by psych.  Needs outpatient follow-up.  They have signed off.  Hx of seizure disorder - Continue home Keppra  and Depakote    Essential hypertension Continue to holdhome hydrochlorothiazide , metoprolol , clonidine , and prazosin  in setting of low normal blood pressure    Hypothyroidism -  Continue home Synthroid    Hx of gastroesophageal reflux (GERD) - Protonix    Schizoaffective disorder (HCC) - Continue home Cogentin  and Cobenfy      PTSD (post-traumatic stress disorder) -  Hold home clonidine  and prazosin    Depression - Continue home Lexapro , Seroquel , trazodone  -Will reduce dose of home trazodone  given patient is sleepy during the day   Intellectual disability - Patient has legal guardian for decision making   Morbid obesity (HCC) Body mass index is 52.18 kg/m.   Meets criteria for morbid obesity based on the presence of 1 or more chronic comorbidities. This complicates overall care and prognosis.       VTE prophylaxis:  Eliquis   GI prophylaxis: Protonix  Diet: Heart healthy Access: PIV Lines: None Code Status:  Full Code HCDM: Legal guardian Lorrin Soil Scientist: Yes             Admission status: Inpatient, Telemetry bed Patient is from: Group home Anticipated d/c is to: Group home or inpatient psychiatric facility Anticipated d/c date is: 1 to 2 days Patient currently: Receiving IV antibiotics, IV fluids, currently on supplemental O2, and awaiting inpatient psychiatric consultation     Family Communication:   none present  Consults called: Psychiatry     Subjective:  Earlier this afternoon, patient got up to use the restroom and had a syncopal episode.  She did not have her oxygen on and when her vitals were checked her O2 sats were in 80s.  Patient was also noted to be tachycardic and normotensive. She was initially somnolent but when placed on nonrebreather she became more awake. ABG was obtained while she was on NRB and was WNL except for elevated PCO2.  Objective: Vitals:   12/21/23 0738 12/21/23 1425 12/21/23 1449 12/21/23 1612  BP: 102/65 (!) 130/97 109/83   Pulse: (!) 106 (!) 121 (!) 115   Resp: 16  16   Temp: 98.5 F (36.9 C)  98 F (36.7 C)   TempSrc:   Oral   SpO2: 92% 96% 95% 92%  Weight:      Height:        Intake/Output Summary (Last 24 hours) at 12/21/2023 1838 Last data filed at 12/21/2023 1013 Gross per 24 hour  Intake 300 ml  Output 300 ml  Net 0 ml   Filed Weights   12/16/23 1322  Weight: (!)  137.9 kg    Examination:   Physical Exam  Constitutional: In no distress.  Cardiovascular: Normal rate, regular rhythm. No lower extremity edema  Pulmonary: Non labored breathing on [place] no wheezing or rales though breath sounds are distant given body habitus.    Abdominal: Soft. Non distended and non tender Musculoskeletal: Normal range of motion.     Neurological: Alert and oriented to person, place, and time. Non focal  Skin: Skin is warm and dry.     Data Reviewed: I have personally reviewed following labs and imaging studies  CBC: Recent Labs  Lab 12/16/23 1328 12/17/23 1016 12/18/23 0827 12/20/23 0548 12/21/23 0538  WBC 10.9* 7.7 7.6 7.3 7.0  NEUTROABS  --  5.3  --   --   --   HGB 11.2* 12.0 11.2* 10.6* 11.2*  HCT 36.9 39.2 37.6 35.5* 36.6  MCV 80.9 80.7 83.4 82.2 80.6  PLT 170 153 127* 132* 131*   Basic Metabolic Panel: Recent Labs  Lab 12/16/23 1328 12/17/23 1146 12/18/23 0827 12/21/23 0538  NA 138 138 142 139  K 3.5 3.9 3.7 4.3  CL 99 101  109 102  CO2 30 30 26 29   GLUCOSE 125* 117* 101* 81  BUN 9 8 6 9   CREATININE 0.92 0.90 0.73 0.84  CALCIUM 8.8* 8.3* 7.7* 9.1   GFR: Estimated Creatinine Clearance: 141 mL/min (by C-G formula based on SCr of 0.84 mg/dL). Liver Function Tests: No results for input(s): AST, ALT, ALKPHOS, BILITOT, PROT, ALBUMIN in the last 168 hours.  No results for input(s): LIPASE, AMYLASE in the last 168 hours. No results for input(s): AMMONIA in the last 168 hours. Coagulation Profile: No results for input(s): INR, PROTIME in the last 168 hours. Cardiac Enzymes: No results for input(s): CKTOTAL, CKMB, CKMBINDEX, TROPONINI in the last 168 hours. BNP (last 3 results) No results for input(s): PROBNP in the last 8760 hours. HbA1C: No results for input(s): HGBA1C in the last 72 hours. CBG: Recent Labs  Lab 12/21/23 1412  GLUCAP 123*   Lipid Profile: No results for input(s): CHOL, HDL,  LDLCALC, TRIG, CHOLHDL, LDLDIRECT in the last 72 hours. Thyroid Function Tests: No results for input(s): TSH, T4TOTAL, FREET4, T3FREE, THYROIDAB in the last 72 hours. Anemia Panel: Recent Labs    12/21/23 0538  FERRITIN 17  TIBC 410  IRON 29   Sepsis Labs: Recent Labs  Lab 12/17/23 1016 12/17/23 1146 12/17/23 1240  PROCALCITON  --   --  <0.10  LATICACIDVEN 2.5* 1.6  --     Recent Results (from the past 240 hours)  Resp panel by RT-PCR (RSV, Flu A&B, Covid) Anterior Nasal Swab     Status: None   Collection Time: 12/16/23  1:28 PM   Specimen: Anterior Nasal Swab  Result Value Ref Range Status   SARS Coronavirus 2 by RT PCR NEGATIVE NEGATIVE Final    Comment: (NOTE) SARS-CoV-2 target nucleic acids are NOT DETECTED.  The SARS-CoV-2 RNA is generally detectable in upper respiratory specimens during the acute phase of infection. The lowest concentration of SARS-CoV-2 viral copies this assay can detect is 138 copies/mL. A negative result does not preclude SARS-Cov-2 infection and should not be used as the sole basis for treatment or other patient management decisions. A negative result may occur with  improper specimen collection/handling, submission of specimen other than nasopharyngeal swab, presence of viral mutation(s) within the areas targeted by this assay, and inadequate number of viral copies(<138 copies/mL). A negative result must be combined with clinical observations, patient history, and epidemiological information. The expected result is Negative.  Fact Sheet for Patients:  bloggercourse.com  Fact Sheet for Healthcare Providers:  seriousbroker.it  This test is no t yet approved or cleared by the [place]  FDA and  has been authorized for detection and/or diagnosis of SARS-CoV-2 by FDA under an Emergency Use Authorization (EUA). This EUA will remain  in effect (meaning this test can be used)  for the duration of the COVID-19 declaration under Section 564(b)(1) of the Act, 21 U.S.C.section 360bbb-3(b)(1), unless the authorization is terminated  or revoked sooner.       Influenza A by PCR NEGATIVE NEGATIVE Final   Influenza B by PCR NEGATIVE NEGATIVE Final    Comment: (NOTE) The Xpert Xpress SARS-CoV-2/FLU/RSV plus assay is intended as an aid in the diagnosis of influenza from Nasopharyngeal swab specimens and should not be used as a sole basis for treatment. Nasal washings and aspirates are unacceptable for Xpert Xpress SARS-CoV-2/FLU/RSV testing.  Fact Sheet for Patients: bloggercourse.com  Fact Sheet for Healthcare Providers: seriousbroker.it  This test is not yet approved or cleared by the [place]   FDA and has been authorized for detection and/or diagnosis of SARS-CoV-2 by FDA under an Emergency Use Authorization (EUA). This EUA will remain in effect (meaning this test can be used) for the duration of the COVID-19 declaration under Section 564(b)(1) of the Act, 21 U.S.C. section 360bbb-3(b)(1), unless the authorization is terminated or revoked.     Resp Syncytial Virus by PCR NEGATIVE NEGATIVE Final    Comment: (NOTE) Fact Sheet for Patients: bloggercourse.com  Fact Sheet for Healthcare Providers: seriousbroker.it  This test is not yet approved or cleared by the [place]  FDA and has been authorized for detection and/or diagnosis of SARS-CoV-2 by FDA under an Emergency Use Authorization (EUA). This EUA will remain in effect (meaning this test can be used) for the duration of the COVID-19 declaration under Section 564(b)(1) of the Act, 21 U.S.C. section 360bbb-3(b)(1), unless the authorization is terminated or revoked.  Performed at [facility], [address] [place], [place] [address]   Culture, blood (routine x 2)     Status: None  (Preliminary result)   Collection Time: 12/17/23 10:16 AM   Specimen: BLOOD  Result Value Ref Range Status   Specimen Description BLOOD BLOOD RIGHT HAND  Final   Special Requests   Final    BOTTLES DRAWN AEROBIC AND ANAEROBIC Blood Culture adequate volume   Culture   Final    NO GROWTH 4 DAYS Performed at [facility], [address], [place], [place] [address]    Report Status PENDING  Incomplete  Culture, blood (routine x 2)     Status: None (Preliminary result)   Collection Time: 12/17/23 10:16 AM   Specimen: BLOOD  Result Value Ref Range Status   Specimen Description BLOOD BLOOD LEFT ARM  Final   Special Requests   Final    BOTTLES DRAWN AEROBIC AND ANAEROBIC Blood Culture adequate volume   Culture   Final    NO GROWTH 4 DAYS Performed at [facility], [address], [place], [place] [address]    Report Status PENDING  Incomplete  Respiratory (~20 pathogens) panel by PCR     Status: Abnormal   Collection Time: 12/17/23 12:41 PM   Specimen: Nasopharyngeal Swab; Respiratory  Result Value Ref Range Status   Adenovirus NOT DETECTED NOT DETECTED Final   Coronavirus 229E NOT DETECTED NOT DETECTED Final    Comment: (NOTE) The Coronavirus on the Respiratory Panel, DOES NOT test for the novel  Coronavirus (2019 nCoV)    Coronavirus HKU1 NOT DETECTED NOT DETECTED Final   Coronavirus NL63 NOT DETECTED NOT DETECTED Final   Coronavirus OC43 NOT DETECTED NOT DETECTED Final   Metapneumovirus NOT DETECTED NOT DETECTED Final   Rhinovirus / Enterovirus DETECTED (A) NOT DETECTED Final   Influenza A NOT DETECTED NOT DETECTED Final   Influenza B NOT DETECTED NOT DETECTED Final   Parainfluenza Virus 1 NOT DETECTED NOT DETECTED Final   Parainfluenza Virus 2 NOT DETECTED NOT DETECTED Final   Parainfluenza Virus 3 NOT DETECTED NOT DETECTED Final   Parainfluenza Virus 4 NOT DETECTED NOT DETECTED Final   Respiratory Syncytial Virus NOT DETECTED NOT DETECTED Final    Bordetella pertussis NOT DETECTED NOT DETECTED Final   Bordetella Parapertussis NOT DETECTED NOT DETECTED Final   Chlamydophila pneumoniae NOT DETECTED NOT DETECTED Final   Mycoplasma pneumoniae NOT DETECTED NOT DETECTED Final    Comment: Performed at [facility], [address] [place], [place] [address]         Radiology  Studies: No results found.       Scheduled Meds:  apixaban   5 mg Oral BID   benztropine   1 mg Oral BID   dextromethorphan -guaiFENesin   1 tablet Oral BID   divalproex   1,000 mg Oral BID   escitalopram   10 mg Oral Daily   folic acid   1 mg Oral Daily   levETIRAcetam   500 mg Oral BID   levothyroxine   50 mcg Oral Q0600   LORazepam   1 mg Oral TID   pantoprazole   80 mg Oral Daily   QUEtiapine   50 mg Oral QHS   trazodone   300 mg Oral QHS   Xanomeline-Trospium  Chloride  1 capsule Oral BID   Continuous Infusions:  cefTRIAXone  (ROCEPHIN )  IV 2 g (12/21/23 1248)   lactated ringers            Alban Pepper, MD [facility] 12/21/2023, 6:38 PM

## 2023-12-21 NOTE — Plan of Care (Signed)

## 2023-12-21 NOTE — Evaluation (Addendum)
 Physical Therapy Evaluation Patient Details Name: Sherry Bryan MRN: [id number] DOB: 1995-11-14 Today's Date: 12/21/2023  History of Present Illness  Pt is a 28 year old female admitted with sepsis due to pneumonia, acute hypoxic respiratory failure; of note, pt presented to ED with suicidal ideations, Patient currently has no suicidal ideations    PMH significant for  hypertension, hypothyroidism, seizure disorder, GERD, prior blood clots on Eliquis , major depressive disorder, DMDD, intellectual disability, borderline personality disorder, PTSD, and adjustment disorder  Clinical Impression  Pt is a 28 y.o. female admitted d/t sepsis secondary to pneumonia. Pt received in supine and was agreeable to therapy. Pt able to mobilize to EOB and perform STS with CGA/supervision. Upon standing, pt reported onset of dizziness that reduced with time and mobility. She ambulated with +1 CGA for 20 ft initially but reported new onset of dizziness that required her to sit with 2+ assist for safety. SpO2 remained >96% on 4-6L throughout session. BP once sitting was 126/99. Ambulated an additional 67ft back to her room with 2+ min and was returned to R side-lying with all needs met. Pt will continue to benefit from skilled therapy services to address her reduced activity tolerance and safety with functional mobility.      If plan is discharge home, recommend the following: A little help with walking and/or transfers;A little help with bathing/dressing/bathroom   Can travel by private vehicle        Equipment Recommendations None recommended by PT  Recommendations for Other Services       Functional Status Assessment Patient has had a recent decline in their functional status and demonstrates the ability to make significant improvements in function in a reasonable and predictable amount of time.     Precautions / Restrictions Precautions Precautions: Fall Recall of Precautions/Restrictions:  Impaired Restrictions Weight Bearing Restrictions Per Provider Order: No      Mobility  Bed Mobility Overal bed mobility: Needs Assistance Bed Mobility: Supine to Sit, Sit to Supine     Supine to sit: Contact guard, HOB elevated Sit to supine: Min assist   General bed mobility comments: min A to guide BLE back onto bed    Transfers Overall transfer level: Needs assistance Equipment used: None Transfers: Sit to/from Stand Sit to Stand: Contact guard assist           General transfer comment: reports of dizziness that reduced with time and movement    Ambulation/Gait Ambulation/Gait assistance: +2 physical assistance, Min assist Gait Distance (Feet): 40 Feet Assistive device: None Gait Pattern/deviations: Step-through pattern, Decreased stride length       General Gait Details: able to ambulate with initial CGA, no AD out to nurses station before experiencing dizziness that required pt to sit down with 2+ assist for safety  Stairs            Wheelchair Mobility     Tilt Bed    Modified Rankin (Stroke Patients Only)       Balance Overall balance assessment: Needs assistance Sitting-balance support: Feet supported Sitting balance-Leahy Scale: Poor Sitting balance - Comments: no UE support   Standing balance support: During functional activity, No upper extremity supported Standing balance-Leahy Scale: Fair Standing balance comment: required increased assist for safety with onset of dizziness                             Pertinent Vitals/Pain Pain Assessment Pain Assessment: No/denies pain    Home  Living Family/patient expects to be discharged to:: Group home Living Arrangements: Group Home Available Help at Discharge: Available 24 hours/day Type of Home: House Home Access: Stairs to enter   [employer] of Steps: 5   Home Layout: One level Home Equipment: None Additional Comments: household ambulation most frequently     Prior Function Prior Level of Function : Needs assist       Physical Assist : ADLs (physical)   ADLs (physical): IADLs;Bathing Mobility Comments: pt report she amb household distances with no AD ADLs Comments: pt reports she is MOD I-I in feeding, grooming, dressing, toileting; she has supervision for bathing and assist for IADLs including med management     Extremity/Trunk Assessment   Upper Extremity Assessment Upper Extremity Assessment: Overall WFL for tasks assessed    Lower Extremity Assessment Lower Extremity Assessment: Overall WFL for tasks assessed    Cervical / Trunk Assessment Cervical / Trunk Assessment: Normal  Communication   Communication Communication: No apparent difficulties    Cognition Arousal: Alert Behavior During Therapy: Flat affect, Anxious   PT - Cognitive impairments: No family/caregiver present to determine baseline                         Following commands: Impaired Following commands impaired: Follows one step commands with increased time     Cueing Cueing Techniques: Verbal cues, Tactile cues, Visual cues     General Comments General comments (skin integrity, edema, etc.): SpO2 remained >96 on 4-6L throughout session    Exercises     Assessment/Plan    PT Assessment Patient needs continued PT services  PT Problem List Decreased strength;Decreased activity tolerance;Decreased balance;Decreased mobility;Decreased coordination;Decreased safety awareness       PT Treatment Interventions Gait training;Stair training;Functional mobility training;Therapeutic activities;Therapeutic exercise;Balance training    PT Goals (Current goals can be found in the Care Plan section)  Acute Rehab PT Goals Patient Stated Goal: none stated PT Goal Formulation: With patient Time For Goal Achievement: 01/04/24 Potential to Achieve Goals: Good    Frequency Min 2X/week     Co-evaluation               AM-PAC PT 6 Clicks  Mobility  Outcome Measure Help needed turning from your back to your side while in a flat bed without using bedrails?: A Little Help needed moving from lying on your back to sitting on the side of a flat bed without using bedrails?: A Little Help needed moving to and from a bed to a chair (including a wheelchair)?: A Little Help needed standing up from a chair using your arms (e.g., wheelchair or bedside chair)?: A Little Help needed to walk in hospital room?: A Little Help needed climbing 3-5 steps with a railing? : A Lot 6 Click Score: 17    End of Session Equipment Utilized During Treatment: Gait belt Activity Tolerance: Other (comment) (limited d/t dizziness) Patient left: in bed;with call bell/phone within reach;with bed alarm set Nurse Communication: Mobility status PT Visit Diagnosis: Unsteadiness on feet (R26.81);Other abnormalities of gait and mobility (R26.89);Difficulty in walking, not elsewhere classified (R26.2)    Time: 8871-8855 PT Time Calculation (min) (ACUTE ONLY): 16 min   Charges:   PT Evaluation $PT Eval Low Complexity: 1 Low   PT General Charges $$ ACUTE PT VISIT: 1 Visit         Allena Bulls, SPT  Maryanne Finder, PT, DPT Physical Therapist - [place]  [facility]  Allena Bulls 12/21/2023, 1:55 PM

## 2023-12-22 LAB — COMPREHENSIVE METABOLIC PANEL WITH GFR
ALT: 22 U/L (ref 0–44)
AST: 28 U/L (ref 15–41)
Albumin: 3.1 g/dL — ABNORMAL LOW (ref 3.5–5.0)
Alkaline Phosphatase: 49 U/L (ref 38–126)
Anion gap: 6 (ref 5–15)
BUN: 8 mg/dL (ref 6–20)
CO2: 32 mmol/L (ref 22–32)
Calcium: 9.1 mg/dL (ref 8.9–10.3)
Chloride: 100 mmol/L (ref 98–111)
Creatinine, Ser: 0.81 mg/dL (ref 0.44–1.00)
GFR, Estimated: >60 mL/min (ref >60)
Glucose, Bld: 117 mg/dL — ABNORMAL HIGH (ref 70–99)
Potassium: 4.6 mmol/L (ref 3.5–5.1)
Sodium: 138 mmol/L (ref 135–145)
Total Bilirubin: <0.2 mg/dL (ref 0.0–1.2)
Total Protein: 6.7 g/dL (ref 6.5–8.1)

## 2023-12-22 LAB — PRO BRAIN NATRIURETIC PEPTIDE: Pro Brain Natriuretic Peptide: <50 pg/mL (ref <300.0)

## 2023-12-22 LAB — BLOOD GAS, VENOUS
Acid-Base Excess: 9.1 mmol/L — ABNORMAL HIGH (ref 0.0–2.0)
Bicarbonate: 36.3 mmol/L — ABNORMAL HIGH (ref 20.0–28.0)
O2 Saturation: 97 %
Patient temperature: 37
pCO2, Ven: 60 mmHg (ref 44–60)
pH, Ven: 7.39 (ref 7.25–7.43)
pO2, Ven: 80 mmHg — ABNORMAL HIGH (ref 32–45)

## 2023-12-22 LAB — CBC
HCT: 39 % (ref 36.0–46.0)
Hemoglobin: 12.1 g/dL (ref 12.0–15.0)
MCH: 24.9 pg — ABNORMAL LOW (ref 26.0–34.0)
MCHC: 31 g/dL (ref 30.0–36.0)
MCV: 80.2 fL (ref 80.0–100.0)
Platelets: 156 K/uL (ref 150–400)
RBC: 4.86 MIL/uL (ref 3.87–5.11)
RDW: 21.2 % — ABNORMAL HIGH (ref 11.5–15.5)
WBC: 8.2 K/uL (ref 4.0–10.5)
nRBC: 0.2 % (ref 0.0–0.2)

## 2023-12-22 LAB — CULTURE, BLOOD (ROUTINE X 2)
Culture: NO GROWTH
Culture: NO GROWTH
Special Requests: ADEQUATE
Special Requests: ADEQUATE

## 2023-12-22 MED ORDER — LORAZEPAM 0.5 MG PO TABS
0.5000 mg | ORAL_TABLET | Freq: Three times a day (TID) | ORAL | Status: DC
  Administered 2023-12-23: 0.5 mg via ORAL
  Filled 2023-12-22 (×2): qty 1

## 2023-12-22 MED ORDER — METOPROLOL SUCCINATE ER 50 MG PO TB24
50.0000 mg | ORAL_TABLET | Freq: Every day | ORAL | Status: DC
  Administered 2023-12-22 – 2023-12-25 (×4): 50 mg via ORAL
  Filled 2023-12-22 (×4): qty 1

## 2023-12-22 MED ORDER — QUETIAPINE FUMARATE 25 MG PO TABS
25.0000 mg | ORAL_TABLET | Freq: Every day | ORAL | Status: DC
  Administered 2023-12-22 – 2023-12-23 (×2): 25 mg via ORAL
  Filled 2023-12-22 (×2): qty 1

## 2023-12-22 MED ORDER — TRAZODONE HCL 50 MG PO TABS
150.0000 mg | ORAL_TABLET | Freq: Every day | ORAL | Status: DC
  Administered 2023-12-22: 150 mg via ORAL
  Filled 2023-12-22: qty 1

## 2023-12-22 MED ORDER — LACTATED RINGERS IV BOLUS
500.0000 mL | Freq: Once | INTRAVENOUS | Status: AC
  Administered 2023-12-22: 500 mL via INTRAVENOUS

## 2023-12-22 NOTE — Progress Notes (Signed)
 Been in communication throughout the day with MD regarding patient's sleepiness. MD changed medication orders and encouraged use of bipap. Patient currently using bipap. No signs of distress noted at this time.

## 2023-12-22 NOTE — Progress Notes (Signed)
 Patient refusing to wear bipap any longer. Put back on nasal canula 5L. MD aware.

## 2023-12-22 NOTE — Progress Notes (Signed)
 PROGRESS NOTE    Sherry Bryan  FMW:[id number] DOB: 1995/05/11 DOA: 12/16/2023 PCP: Pcp, No   Brief Narrative:   Sherry Bryan is a 28 y.o. year old female with past medical history of hypertension, hypothyroidism, seizure disorder, GERD, prior blood clots on Eliquis , major depressive disorder, DMDD, intellectual disability, borderline personality disorder, PTSD, and adjustment disorder.  She presented to [facility] ED yesterday with reports of suicidal ideation and productive cough.  Ultimately she was medically cleared and she remained in the ED voluntary status pending psychiatric evaluation.  This morning she reports that she has had a productive cough with green sputum, dyspnea, and chest pain associated with coughing for about 1 week.  She has not had any known sick contacts.     ED Course: This morning patient was noted to be afebrile temp 38.6 C, BP 98/71 and as low as 89/53 with MAP of 63, RR 21, SpO2 89% on room air and improving to 93% on 4 L via nasal cannula. CT head obtained and shows no acute intracranial abnormality with mucosal thickening and air-fluid levels in bilateral maxillary ethmoid sinuses and left sphenoid sinus.  CTA PE obtained and negative for PE, did show ground glass and streaky opacities in the lower lobes bilaterally. Labs notable for leukocytosis WBC 10.9 yesterday on ED arrival however improved to 7.7 this morning, lactic acid mildly elevated at 2.5, blood cultures obtained and in process.  BMP ordered not collected for this morning yesterday BMP grossly normal with mild hyperglycemia with glucose 125 and calcium 8.8.  COVID flu and RSV negative yesterday.  She was given Tylenol , Rocephin , doxycycline , and home meds including antihypertensives.  Patient was evaluated by telepsychiatry while in the ED who found patient to be at moderate risk of self-harm in the current environment and plan for reassessment this morning for final psychiatric disposition  recommendations.   Admitted by TRH.   Assessment & Plan:   Principal Problem:   Sepsis due to pneumonia [facility]) Active Problems:   Hx of seizure disorder   Hx of gastroesophageal reflux (GERD)   Intellectual disability   Suicidal ideations   Depression   Acute hypoxic respiratory failure (HCC)   Hypothyroidism   Essential hypertension   Morbid obesity (HCC)   Schizoaffective disorder (HCC)   PTSD (post-traumatic stress disorder)   * Sepsis due to pneumonia (HCC) Sepsis physiology resolved.  Patient hypotensive with elevated lactic acid, hypoxia, CT showing bilateral lower lobe infiltrate concerning for pneumonia/atelectasis/aspiration -Viral panel positive for rhinovirus/enterovirus, procalcitonin is less than 0.10 - Blood cultures no growth at 3 days, sputum cultures pending -Will complete treatment for CAP as well.  - Supportive care.   Acute hypoxic respiratory failure (HCC) Chronic hypercapnic RF  Likely OHS/OSA Continues to require 4 to 6 L of oxygen.  Patient had a syncopal episode when oxygen was removed and O2 sats in the 80s.  ABG showed normal pH with elevated pCO2 and normal O2 saturation on nonrebreather.  Suspect patient has a component of obesity hypoventilation syndrome and oxygenation may be worse with recent pneumonia. -Pulmonology was consulted given persistent hypoxia -Patient will require BiPAP on discharge and will need further titration of this in outpatient pulmonology clinic. -Supplemental O2 as needed to maintain SpO2 greater than 90%, wean as able   Suicidal ideations Resolved  - Patient currently has no suicidal ideations.  Evaluated by psych.  Needs outpatient follow-up.  They have signed off.  Hx of seizure disorder - Continue home Keppra  and Depakote   Essential hypertension Will resume home metoprolol , given patient with tachycardia possibly due to withdrawal.  Continue to holdhome hydrochlorothiazide , clonidine , and prazosin  in setting of  low normal blood pressure    Hypothyroidism - Continue home Synthroid    Hx of gastroesophageal reflux (GERD) - Protonix    Schizoaffective disorder (HCC) - Continue home Cogentin  and Cobenfy      PTSD (post-traumatic stress disorder) - Hold home clonidine  and prazosin    Depression - Continue home Lexapro , Seroquel , trazodone  -Will continue to reduce home Seroquel  and trazodone  dose given drowsiness   Intellectual disability - Patient has legal guardian for decision making   Morbid obesity (HCC) Body mass index is 52.18 kg/m.   Meets criteria for morbid obesity based on the presence of 1 or more chronic comorbidities. This complicates overall care and prognosis.       VTE prophylaxis:  Eliquis   GI prophylaxis: Protonix  Diet: Heart healthy Access: PIV Lines: None Code Status:  Full Code HCDM: Legal guardian Lorrin Soil Scientist: Yes             Admission status: Inpatient, Telemetry bed Patient is from: Group home Anticipated d/c is to: Group home or inpatient psychiatric facility Anticipated d/c date is: 1 to 2 days Patient currently: Receiving IV antibiotics, IV fluids, currently on supplemental O2, and awaiting inpatient psychiatric consultation     Family Communication:   none present  Consults called: Psychiatry     Subjective:  No issues overnight, patient refusing to wear BiPAP   Objective: Vitals:   12/22/23 0751 12/22/23 1000 12/22/23 1133 12/22/23 1527  BP: 100/72  (!) 110/90 121/84  Pulse: 95  (!) 126 (!) 107  Resp: 20  20 20   Temp: 98.1 F (36.7 C)  98.3 F (36.8 C) 98.2 F (36.8 C)  TempSrc: Oral  Oral Axillary  SpO2: 90% 95% 95% 97%  Weight:      Height:        Intake/Output Summary (Last 24 hours) at 12/22/2023 1826 Last data filed at 12/22/2023 1420 Gross per 24 hour  Intake 840 ml  Output 250 ml  Net 590 ml   Filed Weights   12/16/23 1322  Weight: (!) 137.9 kg    Examination:     Physical Exam  Constitutional:  In no distress.  Cardiovascular: Normal rate, regular rhythm. No lower extremity edema  Pulmonary: Non labored breathing on nasal cannula, no wheezing or rales.   Abdominal: Soft.  Obese, nondistended, nontender Musculoskeletal: Normal range of motion.     Neurological: Alert and oriented to person, place, and time. Non focal  Skin: Skin is warm and dry.     Data Reviewed: I have personally reviewed following labs and imaging studies  CBC: Recent Labs  Lab 12/17/23 1016 12/18/23 0827 12/20/23 0548 12/21/23 0538 12/22/23 1106  WBC 7.7 7.6 7.3 7.0 8.2  NEUTROABS 5.3  --   --   --   --   HGB 12.0 11.2* 10.6* 11.2* 12.1  HCT 39.2 37.6 35.5* 36.6 39.0  MCV 80.7 83.4 82.2 80.6 80.2  PLT 153 127* 132* 131* 156   Basic Metabolic Panel: Recent Labs  Lab 12/16/23 1328 12/17/23 1146 12/18/23 0827 12/21/23 0538 12/22/23 1106  NA 138 138 142 139 138  K 3.5 3.9 3.7 4.3 4.6  CL 99 101 109 102 100  CO2 30 30 26 29  32  GLUCOSE 125* 117* 101* 81 117*  BUN 9 8 6 9 8   CREATININE 0.92 0.90 0.73 0.84 0.81  CALCIUM  8.8* 8.3* 7.7* 9.1 9.1   GFR: Estimated Creatinine Clearance: 146.2 mL/min (by C-G formula based on SCr of 0.81 mg/dL). Liver Function Tests: Recent Labs  Lab 12/22/23 1106  AST 28  ALT 22  ALKPHOS 49  BILITOT <0.2  PROT 6.7  ALBUMIN 3.1*    No results for input(s): LIPASE, AMYLASE in the last 168 hours. No results for input(s): AMMONIA in the last 168 hours. Coagulation Profile: No results for input(s): INR, PROTIME in the last 168 hours. Cardiac Enzymes: No results for input(s): CKTOTAL, CKMB, CKMBINDEX, TROPONINI in the last 168 hours. BNP (last 3 results) Recent Labs    12/22/23 1221  PROBNP <50.0   HbA1C: No results for input(s): HGBA1C in the last 72 hours. CBG: Recent Labs  Lab 12/21/23 1412  GLUCAP 123*   Lipid Profile: No results for input(s): CHOL, HDL, LDLCALC, TRIG, CHOLHDL, LDLDIRECT in the last 72  hours. Thyroid Function Tests: No results for input(s): TSH, T4TOTAL, FREET4, T3FREE, THYROIDAB in the last 72 hours. Anemia Panel: Recent Labs    12/21/23 0538  FERRITIN 17  TIBC 410  IRON 29   Sepsis Labs: Recent Labs  Lab 12/17/23 1016 12/17/23 1146 12/17/23 1240  PROCALCITON  --   --  <0.10  LATICACIDVEN 2.5* 1.6  --     Recent Results (from the past 240 hours)  Resp panel by RT-PCR (RSV, Flu A&B, Covid) Anterior Nasal Swab     Status: None   Collection Time: 12/16/23  1:28 PM   Specimen: Anterior Nasal Swab  Result Value Ref Range Status   SARS Coronavirus 2 by RT PCR NEGATIVE NEGATIVE Final    Comment: (NOTE) SARS-CoV-2 target nucleic acids are NOT DETECTED.  The SARS-CoV-2 RNA is generally detectable in upper respiratory specimens during the acute phase of infection. The lowest concentration of SARS-CoV-2 viral copies this assay can detect is 138 copies/mL. A negative result does not preclude SARS-Cov-2 infection and should not be used as the sole basis for treatment or other patient management decisions. A negative result may occur with  improper specimen collection/handling, submission of specimen other than nasopharyngeal swab, presence of viral mutation(s) within the areas targeted by this assay, and inadequate number of viral copies(<138 copies/mL). A negative result must be combined with clinical observations, patient history, and epidemiological information. The expected result is Negative.  Fact Sheet for Patients:  bloggercourse.com  Fact Sheet for Healthcare Providers:  seriousbroker.it  This test is no t yet approved or cleared by the [place]  FDA and  has been authorized for detection and/or diagnosis of SARS-CoV-2 by FDA under an Emergency Use Authorization (EUA). This EUA will remain  in effect (meaning this test can be used) for the duration of the COVID-19 declaration under  Section 564(b)(1) of the Act, 21 U.S.C.section 360bbb-3(b)(1), unless the authorization is terminated  or revoked sooner.       Influenza A by PCR NEGATIVE NEGATIVE Final   Influenza B by PCR NEGATIVE NEGATIVE Final    Comment: (NOTE) The Xpert Xpress SARS-CoV-2/FLU/RSV plus assay is intended as an aid in the diagnosis of influenza from Nasopharyngeal swab specimens and should not be used as a sole basis for treatment. Nasal washings and aspirates are unacceptable for Xpert Xpress SARS-CoV-2/FLU/RSV testing.  Fact Sheet for Patients: bloggercourse.com  Fact Sheet for Healthcare Providers: seriousbroker.it  This test is not yet approved or cleared by the [place]  FDA and has been authorized for detection and/or diagnosis of SARS-CoV-2 by FDA under  an Emergency Use Authorization (EUA). This EUA will remain in effect (meaning this test can be used) for the duration of the COVID-19 declaration under Section 564(b)(1) of the Act, 21 U.S.C. section 360bbb-3(b)(1), unless the authorization is terminated or revoked.     Resp Syncytial Virus by PCR NEGATIVE NEGATIVE Final    Comment: (NOTE) Fact Sheet for Patients: bloggercourse.com  Fact Sheet for Healthcare Providers: seriousbroker.it  This test is not yet approved or cleared by the [place]  FDA and has been authorized for detection and/or diagnosis of SARS-CoV-2 by FDA under an Emergency Use Authorization (EUA). This EUA will remain in effect (meaning this test can be used) for the duration of the COVID-19 declaration under Section 564(b)(1) of the Act, 21 U.S.C. section 360bbb-3(b)(1), unless the authorization is terminated or revoked.  Performed at [facility], [address] [place], [place] [address]   Culture, blood (routine x 2)     Status: None   Collection Time: 12/17/23 10:16 AM   Specimen:  BLOOD  Result Value Ref Range Status   Specimen Description BLOOD BLOOD RIGHT HAND  Final   Special Requests   Final    BOTTLES DRAWN AEROBIC AND ANAEROBIC Blood Culture adequate volume   Culture   Final    NO GROWTH 5 DAYS Performed at [facility], [address] [place], [place] [address]    Report Status 12/22/2023 FINAL  Final  Culture, blood (routine x 2)     Status: None   Collection Time: 12/17/23 10:16 AM   Specimen: BLOOD  Result Value Ref Range Status   Specimen Description BLOOD BLOOD LEFT ARM  Final   Special Requests   Final    BOTTLES DRAWN AEROBIC AND ANAEROBIC Blood Culture adequate volume   Culture   Final    NO GROWTH 5 DAYS Performed at [facility], [address] [place], [place] [address]    Report Status 12/22/2023 FINAL  Final  Respiratory (~20 pathogens) panel by PCR     Status: Abnormal   Collection Time: 12/17/23 12:41 PM   Specimen: Nasopharyngeal Swab; Respiratory  Result Value Ref Range Status   Adenovirus NOT DETECTED NOT DETECTED Final   Coronavirus 229E NOT DETECTED NOT DETECTED Final    Comment: (NOTE) The Coronavirus on the Respiratory Panel, DOES NOT test for the novel  Coronavirus (2019 nCoV)    Coronavirus HKU1 NOT DETECTED NOT DETECTED Final   Coronavirus NL63 NOT DETECTED NOT DETECTED Final   Coronavirus OC43 NOT DETECTED NOT DETECTED Final   Metapneumovirus NOT DETECTED NOT DETECTED Final   Rhinovirus / Enterovirus DETECTED (A) NOT DETECTED Final   Influenza A NOT DETECTED NOT DETECTED Final   Influenza B NOT DETECTED NOT DETECTED Final   Parainfluenza Virus 1 NOT DETECTED NOT DETECTED Final   Parainfluenza Virus 2 NOT DETECTED NOT DETECTED Final   Parainfluenza Virus 3 NOT DETECTED NOT DETECTED Final   Parainfluenza Virus 4 NOT DETECTED NOT DETECTED Final   Respiratory Syncytial Virus NOT DETECTED NOT DETECTED Final   Bordetella pertussis NOT DETECTED NOT DETECTED Final   Bordetella Parapertussis NOT  DETECTED NOT DETECTED Final   Chlamydophila pneumoniae NOT DETECTED NOT DETECTED Final   Mycoplasma pneumoniae NOT DETECTED NOT DETECTED Final    Comment: Performed at [facility], [address], [place], [place] [address]         Radiology Studies: No results found.       Scheduled Meds:  apixaban   5  mg Oral BID   benztropine   1 mg Oral BID   dextromethorphan -guaiFENesin   1 tablet Oral BID   divalproex   1,000 mg Oral BID   escitalopram   10 mg Oral Daily   folic acid   1 mg Oral Daily   levETIRAcetam   500 mg Oral BID   levothyroxine   50 mcg Oral Q0600   LORazepam   0.5 mg Oral TID   pantoprazole   80 mg Oral Daily   QUEtiapine   25 mg Oral QHS   trazodone   150 mg Oral QHS   Xanomeline-Trospium  Chloride  1 capsule Oral BID   Continuous Infusions:          Alban Pepper, MD [facility] 12/22/2023, 6:26 PM

## 2023-12-22 NOTE — Progress Notes (Signed)
 Occupational Therapy Treatment Patient Details Name: Sherry Bryan MRN: [id number] DOB: 04-Aug-1995 Today's Date: 12/22/2023   History of present illness Pt is a 28 year old female admitted with sepsis due to pneumonia, acute hypoxic respiratory failure; of note, pt presented to ED with suicidal ideations, Patient currently has no suicidal ideations    PMH significant for  hypertension, hypothyroidism, seizure disorder, GERD, prior blood clots on Eliquis , major depressive disorder, DMDD, intellectual disability, borderline personality disorder, PTSD, and adjustment disorder   OT comments  Chart reviewed to date, re evaluation completed after RR yesterday. Pt is more lethargic on this date, increased time for one step directions with inconsistent command following. She presents with poor sitting balance on edge of bed and is unable to keep her eyes open for more than a few seconds. HR 120s at rest, 150s bpm with brief supine>sit. Spo2 >90% on 10L via HFNC. Pt returned to semi supine in bed, requests for help ordering her lunch order. Team notified of findings. Discharge recommendations have been updated at this time. OT will follow.       If plan is discharge home, recommend the following:  Supervision due to cognitive status;Direct supervision/assist for financial management;Direct supervision/assist for medications management;Assistance with cooking/housework;Help with stairs or ramp for entrance;A lot of help with walking and/or transfers;A lot of help with bathing/dressing/bathroom   Equipment Recommendations  BSC/3in1;Tub/shower seat (bariatric)    Recommendations for Other Services      Precautions / Restrictions Precautions Precautions: Fall Recall of Precautions/Restrictions: Impaired Restrictions Weight Bearing Restrictions Per Provider Order: No       Mobility Bed Mobility Overal bed mobility: Needs Assistance Bed Mobility: Supine to Sit, Sit to Supine     Supine to  sit: Mod assist, HOB elevated Sit to supine: Mod assist, Used rails        Transfers Overall transfer level: Needs assistance   Transfers: Sit to/from Stand Sit to Stand: Min assist           General transfer comment: unsafe for further attempts due to lethargy     Balance Overall balance assessment: Needs assistance Sitting-balance support: Feet supported Sitting balance-Leahy Scale: Poor Sitting balance - Comments: requires physical assist to maintan edge of bed Postural control: Posterior lean Standing balance support: During functional activity, No upper extremity supported Standing balance-Leahy Scale: Poor                             ADL either performed or assessed with clinical judgement   ADL Overall ADL's : Needs assistance/impaired                 Upper Body Dressing : Maximal assistance   Lower Body Dressing: Maximal assistance       Toileting- Clothing Manipulation and Hygiene: Maximal assistance              Extremity/Trunk Assessment              Vision       Perception     Praxis     Communication Communication Communication: Impaired Factors Affecting Communication: Difficulty expressing self   Cognition Arousal: Lethargic, Suspect due to medications Behavior During Therapy: Flat affect Cognition: History of cognitive impairments, Cognition impaired, No family/caregiver present to determine baseline   Orientation impairments: Time, Situation Awareness: Intellectual awareness impaired, Online awareness impaired Memory impairment (select all impairments): Short-term memory, Declarative long-term memory Attention impairment (select first level of impairment):  Focused attention Executive functioning impairment (select all impairments): Sequencing, Reasoning, Problem solving                   Following commands: Impaired Following commands impaired: Follows one step commands inconsistently      Cueing    Cueing Techniques: Verbal cues, Tactile cues  Exercises      Shoulder Instructions       General Comments Pt was on 10 L HFNC but wean all theway down to 5L o2 [place]. RN made aware she was left on only 5 L o2 [place]    Pertinent Vitals/ Pain       Pain Assessment Pain Assessment: No/denies pain  Home Living                                          Prior Functioning/Environment              Frequency  Min 2X/week        Progress Toward Goals  OT Goals(current goals can now be found in the care plan section)  Progress towards OT goals: Not progressing toward goals - comment (impacted by lethargy, will continue to assess)  Acute Rehab OT Goals Time For Goal Achievement: 01/03/24  Plan      Co-evaluation                 AM-PAC OT 6 Clicks Daily Activity     Outcome Measure   Help from another person eating meals?: A Little Help from another person taking care of personal grooming?: A Little Help from another person toileting, which includes using toliet, bedpan, or urinal?: A Lot Help from another person bathing (including washing, rinsing, drying)?: A Lot Help from another person to put on and taking off regular upper body clothing?: A Lot Help from another person to put on and taking off regular lower body clothing?: A Lot 6 Click Score: 14    End of Session Equipment Utilized During Treatment: Oxygen  OT Visit Diagnosis: Other abnormalities of gait and mobility (R26.89)   Activity Tolerance Patient limited by lethargy   Patient Left in bed;with call bell/phone within reach;with bed alarm set   Nurse Communication Mobility status        Time: 1126-1140 OT Time Calculation (min): 14 min  Charges: OT General Charges $OT Visit: 1 Visit OT Evaluation $OT Re-eval: 1 Re-eval OT Treatments $Therapeutic Activity: 8-22 mins  Therisa Sheffield, OTD OTR/L  12/22/2023, 1:13 PM

## 2023-12-22 NOTE — Consult Note (Signed)
 PULMONOLOGY         Date: 12/22/2023,   MRN# [id number] Sherry Bryan 01-Dec-1995     AdmissionWeight: (!) 137.9 kg                 CurrentWeight: (!) 137.9 kg  Referring provider: Dr. Franchot   CHIEF COMPLAINT:   Hypercapnic hypoxemic respiratory failure   HISTORY OF PRESENT ILLNESS   This is a 28 year old female with a history of borderline personality disorder, chronic constipation, major depressive disorder, disruptive mood dysregulation disorder, GERD, history of seizure disorder, intellectual disability, pulmonary embolism, morbid obesity posttraumatic stress disorder, suicidal ideation, who was brought in with respiratory distress and a productive cough significant for expectoration of dark phlegm.  On arrival to the emergency department she was hypoxemic requiring 4 L via nasal cannula to reach 89% and was hypotensive with blood pressure less than 100 systolic.  She did have a CT chest obtained on arrival to the ER with ground glass opacification bilaterally but negative for PE.  She is found to be parainfluenza positive.  CT chest with bilateral infiltrates, ABG with hypercapnia and hypoxemia.  She is being evaluated by psychiatry due to self-harm and history of suicidal ideation with multiple psychiatric diagnoses.  PCCM consultation for progressive hypercapnic respiratory failure with hypoxemia potentially needing home BiPAP.   PAST MEDICAL HISTORY   Past Medical History:  Diagnosis Date   Borderline personality disorder (HCC)    Constipation 05/15/2015   Depression    DMDD (disruptive mood dysregulation disorder) 05/15/2015   History of seasonal allergies 05/15/2015   Hx of gastroesophageal reflux (GERD) 05/15/2015   Hx of seizure disorder 05/15/2015   Intellectual disability 05/22/2015   PTSD (post-traumatic stress disorder)    Seizures (HCC)    last one in 2015   Suicidal ideation      SURGICAL HISTORY   History reviewed. No pertinent surgical  history.   FAMILY HISTORY   History reviewed. No pertinent family history.   SOCIAL HISTORY   Social History[1]   MEDICATIONS     Current Medication: Current Medications[2]    ALLERGIES   Ritalin [methylphenidate hcl], Abilify [aripiprazole], and Lithium      REVIEW OF SYSTEMS    Review of Systems:  Gen:  Denies  fever, sweats, chills weigh loss  HEENT: Denies blurred vision, double vision, ear pain, eye pain, hearing loss, nose bleeds, sore throat Cardiac:  No dizziness, chest pain or heaviness, chest tightness,edema Resp:   reports dyspnea chronically  Gi: Denies swallowing difficulty, stomach pain, nausea or vomiting, diarrhea, constipation, bowel incontinence Gu:  Denies bladder incontinence, burning urine Ext:   Denies Joint pain, stiffness or swelling Skin: Denies  skin rash, easy bruising or bleeding or hives Endoc:  Denies polyuria, polydipsia , polyphagia or weight change Psych:   Denies depression, insomnia or hallucinations   Other:  All other systems negative   VS: BP (!) 110/90 (BP Location: Left Wrist)   Pulse (!) 126   Temp 98.3 F (36.8 C) (Oral)   Resp 20   Ht 5' 4 (1.626 m)   Wt (!) 137.9 kg   SpO2 95%   BMI 52.18 kg/m      PHYSICAL EXAM    GENERAL:NAD, no fevers, chills, no weakness no fatigue HEAD: Normocephalic, atraumatic.  EYES: Pupils equal, round, reactive to light. Extraocular muscles intact. No scleral icterus.  MOUTH: Moist mucosal membrane. Dentition intact. No abscess noted.  EAR, NOSE, THROAT: Clear without exudates. No external  lesions.  NECK: Supple. No thyromegaly. No nodules. No JVD.  PULMONARY: decreased breath sounds with mild rhonchi worse at bases bilaterally.  CARDIOVASCULAR: S1 and S2. Regular rate and rhythm. No murmurs, rubs, or gallops. No edema. Pedal pulses 2+ bilaterally.  GASTROINTESTINAL: Soft, nontender, nondistended. No masses. Positive bowel sounds. No hepatosplenomegaly.  MUSCULOSKELETAL: No  swelling, clubbing, or edema. Range of motion full in all extremities.  NEUROLOGIC: Cranial nerves II through XII are intact. No gross focal neurological deficits. Sensation intact. Reflexes intact.  SKIN: No ulceration, lesions, rashes, or cyanosis. Skin warm and dry. Turgor intact.  PSYCHIATRIC: Mood, affect within normal limits. The patient is awake, alert and oriented x 3. Insight, judgment intact.       IMAGING    CT Head Wo Contrast Final result 12/17/2023 10:52 AM        EXAM: CT HEAD WITHOUT CONTRAST 12/17/2023 10:52:44 AM  TECHNIQUE: CT of the head was performed without the administration of intravenous contrast. Automated exposure control, iterative reconstruction, and/or weight based adjustment of the mA/kV was utilized to reduce the radiation dose to as low as reasonably achievable.  ...        Study Result  Narrative & Impression  EXAM: CTA of the Chest without and with contrast for PE 12/17/2023 10:52:44 AM   TECHNIQUE: CTA of the chest was performed without and with the administration of 75 mL of iohexol  (OMNIPAQUE ) 350 MG/ML intravenous contrast. Multiplanar reformatted images are provided for review. MIP images are provided for review. Automated exposure control, iterative reconstruction, and/or weight based adjustment of the mA/kV was utilized to reduce the radiation dose to as low as reasonably achievable.   COMPARISON: None available.   CLINICAL HISTORY: Pulmonary embolism (PE) suspected, high prob.   FINDINGS:   PULMONARY ARTERIES: Pulmonary arteries are adequately opacified for evaluation. No pulmonary embolism. Main pulmonary artery is normal in caliber.   MEDIASTINUM: The heart and pericardium demonstrate no acute abnormality. There is no acute abnormality of the thoracic aorta.   LYMPH NODES: No mediastinal, hilar or axillary lymphadenopathy.   LUNGS AND PLEURA: There are ground glass and streaky opacities present within the lower  lobes bilaterally. The nondependent lungs are relatively clear. No pleural effusion or pneumothorax.   UPPER ABDOMEN: Limited images of the upper abdomen are unremarkable.   SOFT TISSUES AND BONES: No acute bone or soft tissue abnormality.   IMPRESSION: 1. No pulmonary embolism. 2. Ground-glass and streaky opacities in the lower lobes bilaterally, which may reflect atelectasis, aspiration, or atypical/viral pneumonia; correlate clinically.   Electronically signed by: Evalene Coho MD 12/17/2023 11:00 AM EST RP Workstation: [id number]      ASSESSMENT/PLAN   Hypercapnic hypoxemic respiratory failure    Patietn qualifies for BIPAP and likely needs it long term due to morbid obesity and OSA overlap   Will place BIPAP for home use - parainfluenza is most likely cause of ground glass opacities on CT chest but this would not explain the hypercapnic failure - I can follow up with her on outpatient to check settings and make adjustments -PCO2 >55 on ABG           Thank you for allowing me to participate in the care of this patient.   Patient/Family are satisfied with care plan and all questions have been answered.    Provider disclosure: Patient with at least one acute or chronic illness or injury that poses a threat to life or bodily function and is being managed actively during this  encounter.  All of the below services have been performed independently by signing provider:  review of prior documentation from internal and or external health records.  Review of previous and current lab results.  Interview and comprehensive assessment during patient visit today. Review of current and previous chest radiographs/CT scans. Discussion of management and test interpretation with health care team and patient/family.   This document was prepared using Dragon voice recognition software and may include unintentional dictation errors.     Tevis Dunavan, M.D.  Division of Pulmonary  & Critical Care Medicine             [1]  Social History Tobacco Use   Smoking status: Former    Types: Cigarettes   Smokeless tobacco: Never  Vaping Use   Vaping status: Every Day  Substance Use Topics   Alcohol use: No   Drug use: No  [2]  Current Facility-Administered Medications:    acetaminophen  (TYLENOL ) tablet 650 mg, 650 mg, Oral, Q6H PRN, 650 mg at 12/18/23 2141 **OR** acetaminophen  (TYLENOL ) suppository 650 mg, 650 mg, Rectal, Q6H PRN, Foust, Katy L, NP   albuterol  (PROVENTIL ) (2.5 MG/3ML) 0.083% nebulizer solution 2.5 mg, 2.5 mg, Nebulization, Q2H PRN, Foust, Katy L, NP, 2.5 mg at 12/19/23 0253   apixaban  (ELIQUIS ) tablet 5 mg, 5 mg, Oral, BID, Jessup, Charles, MD, 5 mg at 12/22/23 9082   benztropine  (COGENTIN ) tablet 1 mg, 1 mg, Oral, BID, Jessup, Charles, MD, 1 mg at 12/22/23 9080   dextromethorphan -guaiFENesin  (MUCINEX  DM) 30-600 MG per 12 hr tablet 1 tablet, 1 tablet, Oral, BID, Franchot Novel, MD, 1 tablet at 12/22/23 0920   divalproex  (DEPAKOTE ) DR tablet 1,000 mg, 1,000 mg, Oral, BID, Jessup, Charles, MD, 1,000 mg at 12/22/23 0920   escitalopram  (LEXAPRO ) tablet 10 mg, 10 mg, Oral, Daily, Jessup, Charles, MD, 10 mg at 12/22/23 9083   folic acid  (FOLVITE ) tablet 1 mg, 1 mg, Oral, Daily, Willo Dunnings, MD, 1 mg at 12/22/23 9082   levETIRAcetam  (KEPPRA ) tablet 500 mg, 500 mg, Oral, BID, Jessup, Charles, MD, 500 mg at 12/22/23 9083   levothyroxine  (SYNTHROID ) tablet 50 mcg, 50 mcg, Oral, Q0600, Dail Rankin RAMAN, RPH, 50 mcg at 12/22/23 0522   LORazepam  (ATIVAN ) tablet 0.5 mg, 0.5 mg, Oral, TID, Franchot Novel, MD   ondansetron  (ZOFRAN ) tablet 4 mg, 4 mg, Oral, Q6H PRN, 4 mg at 12/22/23 1124 **OR** ondansetron  (ZOFRAN ) injection 4 mg, 4 mg, Intravenous, Q6H PRN, Foust, Katy L, NP, 4 mg at 12/20/23 2227   pantoprazole  (PROTONIX ) EC tablet 80 mg, 80 mg, Oral, Daily, Jessup, Charles, MD, 80 mg at 12/22/23 9083   QUEtiapine  (SEROQUEL ) tablet 25 mg, 25 mg, Oral,  QHS, Franchot Novel, MD   senna-docusate (Senokot-S) tablet 1 tablet, 1 tablet, Oral, QHS PRN, Foust, Katy L, NP, 1 tablet at 12/20/23 0040   traZODone  (DESYREL ) tablet 150 mg, 150 mg, Oral, QHS, Franchot Novel, MD   Xanomeline-Trospium  Chloride 125-30 MG CAPS 1 capsule, 1 capsule, Oral, BID, Niels Kayla FALCON, [facility], 1 capsule at 12/22/23 [phone]

## 2023-12-22 NOTE — Progress Notes (Signed)
 Physical Therapy Treatment Patient Details Name: Sherry Bryan MRN: [id number] DOB: 12-08-1995 Today's Date: 12/22/2023   History of Present Illness Pt is a 28 year old female admitted with sepsis due to pneumonia, acute hypoxic respiratory failure; of note, pt presented to ED with suicidal ideations, Patient currently has no suicidal ideations    PMH significant for  hypertension, hypothyroidism, seizure disorder, GERD, prior blood clots on Eliquis , major depressive disorder, DMDD, intellectual disability, borderline personality disorder, PTSD, and adjustment disorder    PT Comments  Pt was asleep, long sitting in bed upon arrival with sao2 96% on 10 L HFNC. Pt tolerated o2 wean to 5 L nasal canula with sao2 maintaining > 93%. RN made aware pt was left on 5 L [place] post session. Pt presents very lethargic throughout session. Unable to stay awake to fully participate. Does talk and answer questions appropriately when awake but session severely limited by poor ability to stay awake. MD notified post session. Pt did require much more assistance to achieve EOB sitting. She c/o severe dizziness. BP while supine in bed 124/72 in sitting EOB 105/90 (97). Pt only tolerated EOB sitting for a few minutes prior to needing to return to bed 2/2 to dizziness. HR tachy throughout session. At rest HR 124bpm that elevated to 138bpm in sitting. Dc recs updated to reflect pt's change is status and needs for post acute PT. Acute PT will continue to follow and progress per current POC progressing as able per pt tolerance.     If plan is discharge home, recommend the following: Two people to help with walking and/or transfers;Two people to help with bathing/dressing/bathroom;Help with stairs or ramp for entrance;Assist for transportation;Direct supervision/assist for financial management;Direct supervision/assist for medications management;Assistance with cooking/housework     Equipment Recommendations  Other (comment)  (Defer to next level of care)       Precautions / Restrictions Precautions Precautions: Fall Recall of Precautions/Restrictions: Impaired Restrictions Weight Bearing Restrictions Per Provider Order: No     Mobility  Bed Mobility Overal bed mobility: Needs Assistance Bed Mobility: Supine to Sit, Sit to Supine  Supine to sit: Min assist, Mod assist, Used rails Sit to supine: Min assist, Mod assist General bed mobility comments: pt required more assistance to achieve EOB sitting. Author was hopeful to improve alertness however pt remains extremely lethargic and required constant cues (tcs+ vcs) to stay awake. Unsafe to attempt any OOB activity due to current level of arousal    Transfers  General transfer comment: unsafe to attempt due to pt's inability to stay awake while seated EOB. pt keeps eyes closed but was verbalizing severe dizziness. BP at rest prior to EOB sitting 124/72. Upon sitting up EOB 105/90 (97). pt only tolerated EOB sititng x ~ 2 minute before demanding to lay back down due to dizziness. MD and care team notified    Ambulation/Gait  General Gait Details: unable due to lethargy   Balance Overall balance assessment: Needs assistance Sitting-balance support: Feet supported Sitting balance-Leahy Scale: Poor Sitting balance - Comments: min assist to maintain sitting balance while EOB     Standing balance comment: not tested due to pt's poor alertness/ lethargy      Communication Communication Communication: Impaired Factors Affecting Communication: Other (comment);Difficulty expressing self (due to level of arousal)  Cognition Arousal: Lethargic, Suspect due to medications     PT - Cognitive impairments: No family/caregiver present to determine baseline      PT - Cognition Comments: Pt is very lethargic which  greatly impacts session progression. pt Following commands: Impaired Following commands impaired: Follows one step commands with increased time     Cueing Cueing Techniques: Verbal cues, Tactile cues     General Comments General comments (skin integrity, edema, etc.): Pt was on 10 L HFNC but wean all theway down to 5L o2 [place]. RN made aware she was left on only 5 L o2 [place]      Pertinent Vitals/Pain Pain Assessment Pain Assessment: No/denies pain     PT Goals (current goals can now be found in the care plan section) Acute Rehab PT Goals Patient Stated Goal: rehab then back to group home Progress towards PT goals: Not progressing toward goals - comment (poor level of alertness)    Frequency    Min 2X/week       AM-PAC PT 6 Clicks Mobility   Outcome Measure  Help needed turning from your back to your side while in a flat bed without using bedrails?: A Little Help needed moving from lying on your back to sitting on the side of a flat bed without using bedrails?: A Lot Help needed moving to and from a bed to a chair (including a wheelchair)?: A Lot Help needed standing up from a chair using your arms (e.g., wheelchair or bedside chair)?: A Lot Help needed to walk in hospital room?: A Lot Help needed climbing 3-5 steps with a railing? : A Lot 6 Click Score: 13    End of Session Equipment Utilized During Treatment: Oxygen Activity Tolerance: Patient limited by lethargy;Other (comment);Treatment limited secondary to medical complications (Comment) (c/o severe dizziness) Patient left: in bed;with call bell/phone within reach;with bed alarm set Nurse Communication: Mobility status PT Visit Diagnosis: Unsteadiness on feet (R26.81);Other abnormalities of gait and mobility (R26.89);Difficulty in walking, not elsewhere classified (R26.2)     Time: 1026-1040 PT Time Calculation (min) (ACUTE ONLY): 14 min  Charges:    $Therapeutic Activity: 8-22 mins PT General Charges $$ ACUTE PT VISIT: 1 Visit                    Rankin Essex PTA 12/22/2023, 11:09 AM

## 2023-12-22 NOTE — TOC Progression Note (Signed)
 Transition of Care [facility]) - Progression Note    Patient Details  Name: Sherry Bryan MRN: [id number] Date of Birth: Aug 11, 1995  [facility] CM/SW Contact  Racheal LITTIE Schimke, RN Phone Number: 12/22/2023, 9:33 AM  Clinical Narrative: [name] with Legal Guardian, Lorrin Rollings about discharge recommendation for HHPT/HHOT, she put me on speaker phone with [name], assigned Caseworker. They consent with recommendation, had no preference. Unable to secure [facility] due to Insurance, called Insurance Repr. [name], [phone], who recommends outpatient PT/OT will notify MD. Patient resides in [facility], call manager [name], [phone] or [name], [phone] when medically stable for discharge they will provide transport.                       Expected Discharge Plan and Services                                               Social Drivers of Health (SDOH) Interventions SDOH Screenings   Food Insecurity: No Food Insecurity (12/19/2023)  Housing: Patient Unable To Answer (12/20/2023)  Transportation Needs: Patient Unable To Answer (12/20/2023)  Utilities: Not At Risk (12/19/2023)  Tobacco Use: Medium Risk (12/16/2023)    Readmission Risk Interventions     No data to display

## 2023-12-22 NOTE — Plan of Care (Signed)

## 2023-12-23 LAB — BASIC METABOLIC PANEL WITH GFR
Anion gap: 8 (ref 5–15)
BUN: 10 mg/dL (ref 6–20)
CO2: 32 mmol/L (ref 22–32)
Calcium: 8.9 mg/dL (ref 8.9–10.3)
Chloride: 98 mmol/L (ref 98–111)
Creatinine, Ser: 0.87 mg/dL (ref 0.44–1.00)
GFR, Estimated: >60 mL/min (ref >60)
Glucose, Bld: 97 mg/dL (ref 70–99)
Potassium: 4.5 mmol/L (ref 3.5–5.1)
Sodium: 138 mmol/L (ref 135–145)

## 2023-12-23 LAB — CBC
HCT: 40.7 % (ref 36.0–46.0)
Hemoglobin: 11.9 g/dL — ABNORMAL LOW (ref 12.0–15.0)
MCH: 24.1 pg — ABNORMAL LOW (ref 26.0–34.0)
MCHC: 29.2 g/dL — ABNORMAL LOW (ref 30.0–36.0)
MCV: 82.6 fL (ref 80.0–100.0)
Platelets: 172 K/uL (ref 150–400)
RBC: 4.93 MIL/uL (ref 3.87–5.11)
RDW: 21.6 % — ABNORMAL HIGH (ref 11.5–15.5)
WBC: 7.7 K/uL (ref 4.0–10.5)
nRBC: 0 % (ref 0.0–0.2)

## 2023-12-23 MED ORDER — LORAZEPAM 0.5 MG PO TABS
0.5000 mg | ORAL_TABLET | Freq: Two times a day (BID) | ORAL | Status: DC
  Administered 2023-12-24 – 2023-12-25 (×4): 0.5 mg via ORAL
  Filled 2023-12-23 (×5): qty 1

## 2023-12-23 MED ORDER — LACTULOSE 10 GM/15ML PO SOLN
20.0000 g | Freq: Once | ORAL | Status: AC
  Administered 2023-12-23: 20 g via ORAL
  Filled 2023-12-23: qty 30

## 2023-12-23 MED ORDER — SENNOSIDES-DOCUSATE SODIUM 8.6-50 MG PO TABS
1.0000 | ORAL_TABLET | Freq: Every day | ORAL | Status: DC
  Administered 2023-12-23 – 2023-12-27 (×5): 1 via ORAL
  Filled 2023-12-23 (×5): qty 1

## 2023-12-23 MED ORDER — TRAZODONE HCL 100 MG PO TABS
100.0000 mg | ORAL_TABLET | Freq: Every day | ORAL | Status: DC
  Administered 2023-12-23: 100 mg via ORAL
  Filled 2023-12-23: qty 1

## 2023-12-23 NOTE — Progress Notes (Signed)
 PROGRESS NOTE    Sherry Bryan  FMW:[id number] DOB: 1995/08/26 DOA: 12/16/2023 PCP: Pcp, No   Brief Narrative:   Sherry Bryan is a 28 y.o. year old female with past medical history of hypertension, hypothyroidism, seizure disorder, GERD, prior blood clots on Eliquis , major depressive disorder, DMDD, intellectual disability, borderline personality disorder, PTSD, and adjustment disorder.  She presented to [facility] ED yesterday with reports of suicidal ideation and productive cough.  Ultimately she was medically cleared and she remained in the ED voluntary status pending psychiatric evaluation.  This morning she reports that she has had a productive cough with green sputum, dyspnea, and chest pain associated with coughing for about 1 week.  She has not had any known sick contacts.     ED Course: This morning patient was noted to be afebrile temp 38.6 C, BP 98/71 and as low as 89/53 with MAP of 63, RR 21, SpO2 89% on room air and improving to 93% on 4 L via nasal cannula. CT head obtained and shows no acute intracranial abnormality with mucosal thickening and air-fluid levels in bilateral maxillary ethmoid sinuses and left sphenoid sinus.  CTA PE obtained and negative for PE, did show ground glass and streaky opacities in the lower lobes bilaterally. Labs notable for leukocytosis WBC 10.9 yesterday on ED arrival however improved to 7.7 this morning, lactic acid mildly elevated at 2.5, blood cultures obtained and in process.  BMP ordered not collected for this morning yesterday BMP grossly normal with mild hyperglycemia with glucose 125 and calcium 8.8.  COVID flu and RSV negative yesterday.  She was given Tylenol , Rocephin , doxycycline , and home meds including antihypertensives.  Patient was evaluated by telepsychiatry while in the ED who found patient to be at moderate risk of self-harm in the current environment and plan for reassessment this morning for final psychiatric disposition  recommendations.   Admitted by TRH.   Assessment & Plan:   Principal Problem:   Sepsis due to pneumonia [facility]) Active Problems:   Hx of seizure disorder   Hx of gastroesophageal reflux (GERD)   Intellectual disability   Suicidal ideations   Depression   Acute hypoxic respiratory failure (HCC)   Hypothyroidism   Essential hypertension   Morbid obesity (HCC)   Schizoaffective disorder (HCC)   PTSD (post-traumatic stress disorder)   * Sepsis due to pneumonia (HCC) Sepsis physiology resolved.  Patient hypotensive with elevated lactic acid, hypoxia, CT showing bilateral lower lobe infiltrate concerning for pneumonia/atelectasis/aspiration -Viral panel positive for rhinovirus/enterovirus, procalcitonin is less than 0.10 - Blood cultures no growth at 3 days, sputum cultures pending -Will complete treatment for CAP as well.  - Supportive care.   Acute hypoxic respiratory failure (HCC) Chronic hypercapnic RF  Likely OHS/OSA Continues to require 4 to 6 L of oxygen.  Patient had a syncopal episode when oxygen was removed and O2 sats in the 80s.  ABG showed normal pH with elevated pCO2 and normal O2 saturation on nonrebreather.  Suspect patient has a component of obesity hypoventilation syndrome and oxygenation may be worse with recent pneumonia. -Pulmonology was consulted given persistent hypoxia -Patient will require BiPAP on discharge and will need further titration of this in outpatient pulmonology clinic, -Supplemental O2 as needed to maintain SpO2 greater than 90%, wean as able   Suicidal ideations Resolved  - Patient currently has no suicidal ideations.  Evaluated by psych.  Needs outpatient follow-up.  They have signed off.  Hx of seizure disorder - Continue home Keppra  and Depakote   Essential hypertension Continue home metoprolol , given patient with tachycardia possibly due to withdrawal.  Continue to holdhome hydrochlorothiazide , clonidine , and prazosin  in setting of low  normal blood pressure    Hypothyroidism - Continue home Synthroid    Hx of gastroesophageal reflux (GERD) - Protonix    Schizoaffective disorder (HCC) - Continue home Cogentin  and Cobenfy       PTSD (post-traumatic stress disorder) - Hold home clonidine  and prazosin    Depression - Continue home Lexapro , Seroquel , trazodone  - Continue to reduce home Seroquel  and trazodone  dose given drowsiness   Intellectual disability - Patient has legal guardian for decision making   Morbid obesity (HCC) Body mass index is 52.18 kg/m.   Meets criteria for morbid obesity based on the presence of 1 or more chronic comorbidities. This complicates overall care and prognosis.       VTE prophylaxis:  Eliquis   GI prophylaxis: Protonix  Diet: Heart healthy Access: PIV Lines: None Code Status:  Full Code HCDM: Legal guardian Lorrin Soil Scientist: Yes             Admission status: Inpatient, Telemetry bed Patient is from: Group home Anticipated d/c is to: Group home or inpatient psychiatric facility Anticipated d/c date is: 1 to 2 days Patient currently: Receiving IV antibiotics, IV fluids, currently on supplemental O2, and awaiting inpatient psychiatric consultation     Family Communication:   none present  Consults called: Psychiatry     Subjective:  No issues overnight, patient refusing to wear BiPAP   Objective: Vitals:   12/23/23 0404 12/23/23 0851 12/23/23 1132 12/23/23 1611  BP: 118/82 99/62 99/63  (!) 92/57  Pulse: 98 94 83 82  Resp: (!) 22 16 20 16   Temp: 98.4 F (36.9 C) 98.3 F (36.8 C) 98.4 F (36.9 C) 98.3 F (36.8 C)  TempSrc:  Oral  Oral  SpO2: 95% 95% 96% 93%  Weight:      Height:        Intake/Output Summary (Last 24 hours) at 12/23/2023 1848 Last data filed at 12/23/2023 1416 Gross per 24 hour  Intake 720 ml  Output --  Net 720 ml   Filed Weights   12/16/23 1322  Weight: (!) 137.9 kg    Examination:  Physical Exam  Constitutional: In no  distress.  Cardiovascular: Normal rate, regular rhythm. No lower extremity edema  Pulmonary: Non labored breathing on nasal cannula, no wheezing or rales.   Abdominal: Soft.  Obese non distended and non tender Musculoskeletal: Normal range of motion.     Neurological: Alert and oriented to person, place, and time. Non focal  Skin: Skin is warm and dry.     Data Reviewed: I have personally reviewed following labs and imaging studies  CBC: Recent Labs  Lab 12/17/23 1016 12/18/23 0827 12/20/23 0548 12/21/23 0538 12/22/23 1106 12/23/23 0329  WBC 7.7 7.6 7.3 7.0 8.2 7.7  NEUTROABS 5.3  --   --   --   --   --   HGB 12.0 11.2* 10.6* 11.2* 12.1 11.9*  HCT 39.2 37.6 35.5* 36.6 39.0 40.7  MCV 80.7 83.4 82.2 80.6 80.2 82.6  PLT 153 127* 132* 131* 156 172   Basic Metabolic Panel: Recent Labs  Lab 12/17/23 1146 12/18/23 0827 12/21/23 0538 12/22/23 1106 12/23/23 0329  NA 138 142 139 138 138  K 3.9 3.7 4.3 4.6 4.5  CL 101 109 102 100 98  CO2 30 26 29  32 32  GLUCOSE 117* 101* 81 117* 97  BUN 8 6 9  8  10  CREATININE 0.90 0.73 0.84 0.81 0.87  CALCIUM 8.3* 7.7* 9.1 9.1 8.9   GFR: Estimated Creatinine Clearance: 136.1 mL/min (by C-G formula based on SCr of 0.87 mg/dL). Liver Function Tests: Recent Labs  Lab 12/22/23 1106  AST 28  ALT 22  ALKPHOS 49  BILITOT <0.2  PROT 6.7  ALBUMIN 3.1*    No results for input(s): LIPASE, AMYLASE in the last 168 hours. No results for input(s): AMMONIA in the last 168 hours. Coagulation Profile: No results for input(s): INR, PROTIME in the last 168 hours. Cardiac Enzymes: No results for input(s): CKTOTAL, CKMB, CKMBINDEX, TROPONINI in the last 168 hours. BNP (last 3 results) Recent Labs    12/22/23 1221  PROBNP <50.0   HbA1C: No results for input(s): HGBA1C in the last 72 hours. CBG: Recent Labs  Lab 12/21/23 1412  GLUCAP 123*   Lipid Profile: No results for input(s): CHOL, HDL, LDLCALC, TRIG,  CHOLHDL, LDLDIRECT in the last 72 hours. Thyroid Function Tests: No results for input(s): TSH, T4TOTAL, FREET4, T3FREE, THYROIDAB in the last 72 hours. Anemia Panel: Recent Labs    12/21/23 0538  FERRITIN 17  TIBC 410  IRON  29   Sepsis Labs: Recent Labs  Lab 12/17/23 1016 12/17/23 1146 12/17/23 1240  PROCALCITON  --   --  <0.10  LATICACIDVEN 2.5* 1.6  --     Recent Results (from the past 240 hours)  Resp panel by RT-PCR (RSV, Flu A&B, Covid) Anterior Nasal Swab     Status: None   Collection Time: 12/16/23  1:28 PM   Specimen: Anterior Nasal Swab  Result Value Ref Range Status   SARS Coronavirus 2 by RT PCR NEGATIVE NEGATIVE Final    Comment: (NOTE) SARS-CoV-2 target nucleic acids are NOT DETECTED.  The SARS-CoV-2 RNA is generally detectable in upper respiratory specimens during the acute phase of infection. The lowest concentration of SARS-CoV-2 viral copies this assay can detect is 138 copies/mL. A negative result does not preclude SARS-Cov-2 infection and should not be used as the sole basis for treatment or other patient management decisions. A negative result may occur with  improper specimen collection/handling, submission of specimen other than nasopharyngeal swab, presence of viral mutation(s) within the areas targeted by this assay, and inadequate number of viral copies(<138 copies/mL). A negative result must be combined with clinical observations, patient history, and epidemiological information. The expected result is Negative.  Fact Sheet for Patients:  bloggercourse.com  Fact Sheet for Healthcare Providers:  seriousbroker.it  This test is no t yet approved or cleared by the [place]  FDA and  has been authorized for detection and/or diagnosis of SARS-CoV-2 by FDA under an Emergency Use Authorization (EUA). This EUA will remain  in effect (meaning this test can be used) for the duration  of the COVID-19 declaration under Section 564(b)(1) of the Act, 21 U.S.C.section 360bbb-3(b)(1), unless the authorization is terminated  or revoked sooner.       Influenza A by PCR NEGATIVE NEGATIVE Final   Influenza B by PCR NEGATIVE NEGATIVE Final    Comment: (NOTE) The Xpert Xpress SARS-CoV-2/FLU/RSV plus assay is intended as an aid in the diagnosis of influenza from Nasopharyngeal swab specimens and should not be used as a sole basis for treatment. Nasal washings and aspirates are unacceptable for Xpert Xpress SARS-CoV-2/FLU/RSV testing.  Fact Sheet for Patients: bloggercourse.com  Fact Sheet for Healthcare Providers: seriousbroker.it  This test is not yet approved or cleared by the [place]  FDA and has been  authorized for detection and/or diagnosis of SARS-CoV-2 by FDA under an Emergency Use Authorization (EUA). This EUA will remain in effect (meaning this test can be used) for the duration of the COVID-19 declaration under Section 564(b)(1) of the Act, 21 U.S.C. section 360bbb-3(b)(1), unless the authorization is terminated or revoked.     Resp Syncytial Virus by PCR NEGATIVE NEGATIVE Final    Comment: (NOTE) Fact Sheet for Patients: bloggercourse.com  Fact Sheet for Healthcare Providers: seriousbroker.it  This test is not yet approved or cleared by the [place]  FDA and has been authorized for detection and/or diagnosis of SARS-CoV-2 by FDA under an Emergency Use Authorization (EUA). This EUA will remain in effect (meaning this test can be used) for the duration of the COVID-19 declaration under Section 564(b)(1) of the Act, 21 U.S.C. section 360bbb-3(b)(1), unless the authorization is terminated or revoked.  Performed at [facility], [address] [place], [place] [address]   Culture, blood (routine x 2)     Status: None   Collection Time:  12/17/23 10:16 AM   Specimen: BLOOD  Result Value Ref Range Status   Specimen Description BLOOD BLOOD RIGHT HAND  Final   Special Requests   Final    BOTTLES DRAWN AEROBIC AND ANAEROBIC Blood Culture adequate volume   Culture   Final    NO GROWTH 5 DAYS Performed at [facility], [address] [place], [place] [address]    Report Status 12/22/2023 FINAL  Final  Culture, blood (routine x 2)     Status: None   Collection Time: 12/17/23 10:16 AM   Specimen: BLOOD  Result Value Ref Range Status   Specimen Description BLOOD BLOOD LEFT ARM  Final   Special Requests   Final    BOTTLES DRAWN AEROBIC AND ANAEROBIC Blood Culture adequate volume   Culture   Final    NO GROWTH 5 DAYS Performed at [facility], [address] [place], [place] [address]    Report Status 12/22/2023 FINAL  Final  Respiratory (~20 pathogens) panel by PCR     Status: Abnormal   Collection Time: 12/17/23 12:41 PM   Specimen: Nasopharyngeal Swab; Respiratory  Result Value Ref Range Status   Adenovirus NOT DETECTED NOT DETECTED Final   Coronavirus 229E NOT DETECTED NOT DETECTED Final    Comment: (NOTE) The Coronavirus on the Respiratory Panel, DOES NOT test for the novel  Coronavirus (2019 nCoV)    Coronavirus HKU1 NOT DETECTED NOT DETECTED Final   Coronavirus NL63 NOT DETECTED NOT DETECTED Final   Coronavirus OC43 NOT DETECTED NOT DETECTED Final   Metapneumovirus NOT DETECTED NOT DETECTED Final   Rhinovirus / Enterovirus DETECTED (A) NOT DETECTED Final   Influenza A NOT DETECTED NOT DETECTED Final   Influenza B NOT DETECTED NOT DETECTED Final   Parainfluenza Virus 1 NOT DETECTED NOT DETECTED Final   Parainfluenza Virus 2 NOT DETECTED NOT DETECTED Final   Parainfluenza Virus 3 NOT DETECTED NOT DETECTED Final   Parainfluenza Virus 4 NOT DETECTED NOT DETECTED Final   Respiratory Syncytial Virus NOT DETECTED NOT DETECTED Final   Bordetella pertussis NOT DETECTED NOT DETECTED Final    Bordetella Parapertussis NOT DETECTED NOT DETECTED Final   Chlamydophila pneumoniae NOT DETECTED NOT DETECTED Final   Mycoplasma pneumoniae NOT DETECTED NOT DETECTED Final    Comment: Performed at [facility], [address] [place], [place] [address]         Radiology Studies: No results found.  Scheduled Meds:  apixaban   5 mg Oral BID   benztropine   1 mg Oral BID   dextromethorphan -guaiFENesin   1 tablet Oral BID   divalproex   1,000 mg Oral BID   escitalopram   10 mg Oral Daily   folic acid   1 mg Oral Daily   levETIRAcetam   500 mg Oral BID   levothyroxine   50 mcg Oral Q0600   [START ON 12/24/2023] LORazepam   0.5 mg Oral BID   metoprolol  succinate  50 mg Oral Daily   pantoprazole   80 mg Oral Daily   QUEtiapine   25 mg Oral QHS   senna-docusate  1 tablet Oral QHS   trazodone   150 mg Oral QHS   Xanomeline-Trospium  Chloride  1 capsule Oral BID   Continuous Infusions:    Alban Pepper, MD [facility] 12/23/2023, 6:48 PM

## 2023-12-23 NOTE — Plan of Care (Signed)

## 2023-12-24 DIAGNOSIS — J189 Pneumonia, unspecified organism: Secondary | ICD-10-CM | POA: Diagnosis not present

## 2023-12-24 DIAGNOSIS — B348 Other viral infections of unspecified site: Secondary | ICD-10-CM

## 2023-12-24 DIAGNOSIS — A419 Sepsis, unspecified organism: Secondary | ICD-10-CM | POA: Diagnosis not present

## 2023-12-24 LAB — BLOOD GAS, ARTERIAL
Acid-Base Excess: 9.2 mmol/L — ABNORMAL HIGH (ref 0.0–2.0)
Bicarbonate: 37.3 mmol/L — ABNORMAL HIGH (ref 20.0–28.0)
FIO2: 70 %
O2 Content: 50 L/min
O2 Saturation: 93.9 %
Patient temperature: 37
pCO2 arterial: 66 mmHg (ref 32–48)
pH, Arterial: 7.36 (ref 7.35–7.45)
pO2, Arterial: 71 mmHg — ABNORMAL LOW (ref 83–108)

## 2023-12-24 LAB — BLOOD GAS, VENOUS
Acid-Base Excess: 8.4 mmol/L — ABNORMAL HIGH (ref 0.0–2.0)
Bicarbonate: 36.2 mmol/L — ABNORMAL HIGH (ref 20.0–28.0)
O2 Saturation: 81.4 %
Patient temperature: 37
pCO2, Ven: 64 mmHg — ABNORMAL HIGH (ref 44–60)
pH, Ven: 7.36 (ref 7.25–7.43)
pO2, Ven: 52 mmHg — ABNORMAL HIGH (ref 32–45)

## 2023-12-24 MED ORDER — LACTULOSE 10 GM/15ML PO SOLN
20.0000 g | Freq: Every day | ORAL | Status: DC
  Administered 2023-12-25 – 2023-12-28 (×2): 20 g via ORAL
  Filled 2023-12-24 (×3): qty 30

## 2023-12-24 MED ORDER — FERROUS SULFATE 325 (65 FE) MG PO TABS
325.0000 mg | ORAL_TABLET | Freq: Every day | ORAL | Status: DC
  Administered 2023-12-25 – 2024-01-05 (×12): 325 mg via ORAL
  Filled 2023-12-24 (×12): qty 1

## 2023-12-24 MED ORDER — LORAZEPAM 2 MG/ML IJ SOLN
0.5000 mg | Freq: Once | INTRAMUSCULAR | Status: AC
  Administered 2023-12-24: 0.5 mg via INTRAVENOUS
  Filled 2023-12-24: qty 1

## 2023-12-24 MED ORDER — QUETIAPINE FUMARATE 25 MG PO TABS
12.5000 mg | ORAL_TABLET | Freq: Every day | ORAL | Status: DC
  Administered 2023-12-24 – 2024-01-04 (×12): 12.5 mg via ORAL
  Filled 2023-12-24 (×12): qty 1

## 2023-12-24 MED ORDER — TRAZODONE HCL 50 MG PO TABS
75.0000 mg | ORAL_TABLET | Freq: Every day | ORAL | Status: DC
  Administered 2023-12-24 – 2024-01-04 (×12): 75 mg via ORAL
  Filled 2023-12-24 (×12): qty 2

## 2023-12-24 NOTE — Progress Notes (Signed)
 Called to patient's room secondary to drop in saturation and difficulty to arouse.  Patient ripped off BIPAP earlier in the shift shortly after being placed on (see documentation). 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  ABG recommended at this time, awaiting orders.

## 2023-12-24 NOTE — Progress Notes (Signed)
 Called by CCMD about saturations in the 80's. Pt extremely difficult to arouse. Attempted for about 20 minutes to get her to wake up and sit up cause she had spilled something all in her bed. Pt could not sit up on side of bed without falling back. Finally woke up enough to stand and get on bedside commode. Respiratory was called and assessed and switched back over to heated high flow cannula. Dr Lawence was contacted and new orders were given and will be carried out. ABG to be collected at this time.

## 2023-12-24 NOTE — Progress Notes (Addendum)
 PROGRESS NOTE    Sherry Bryan  FMW:[id number] DOB: 12-03-1995 DOA: 12/16/2023 PCP: Pcp, No   Brief Narrative:   Sherry Bryan is a 28 y.o. year old female with past medical history of hypertension, hypothyroidism, seizure disorder, GERD, prior blood clots on Eliquis , major depressive disorder, DMDD, intellectual disability, borderline personality disorder, PTSD, and adjustment disorder.  She presented to [facility] regional ED yesterday with reports of suicidal ideation and productive cough.  Ultimately she was medically cleared and she remained in the ED voluntary status pending psychiatric evaluation.  This morning she reports that she has had a productive cough with green sputum, dyspnea, and chest pain associated with coughing for about 1 week.  She has not had any known sick contacts.     ED Course: This morning patient was noted to be afebrile temp 38.6 C, BP 98/71 and as low as 89/53 with MAP of 63, RR 21, SpO2 89% on room air and improving to 93% on 4 L via nasal cannula. CT head obtained and shows no acute intracranial abnormality with mucosal thickening and air-fluid levels in bilateral maxillary ethmoid sinuses and left sphenoid sinus.  CTA PE obtained and negative for PE, did show ground glass and streaky opacities in the lower lobes bilaterally. Labs notable for leukocytosis WBC 10.9 yesterday on ED arrival however improved to 7.7 this morning, lactic acid mildly elevated at 2.5, blood cultures obtained and in process.  BMP ordered not collected for this morning yesterday BMP grossly normal with mild hyperglycemia with glucose 125 and calcium 8.8.  COVID flu and RSV negative yesterday.  She was given Tylenol , Rocephin , doxycycline , and home meds including antihypertensives.  Patient was evaluated by telepsychiatry while in the ED who found patient to be at moderate risk of self-harm in the current environment and plan for reassessment this morning for final psychiatric disposition  recommendations.   Admitted by TRH.   Assessment & Plan:   Principal Problem:   Rhinovirus infection Active Problems:   Hx of seizure disorder   Hx of gastroesophageal reflux (GERD)   Intellectual disability   Suicidal ideations   Depression   Acute hypoxic respiratory failure (HCC)   Hypothyroidism   Essential hypertension   Morbid obesity (HCC)   Schizoaffective disorder (HCC)   PTSD (post-traumatic stress disorder)   *Pneumonia, rhinovirus (HCC) Sepsis not POA.  Erroneously stated in prior notes.  Patient hypotensive with elevated lactic acid, hypoxia, CT showing bilateral lower lobe infiltrate concerning for pneumonia/atelectasis/aspiration -Viral panel positive for rhinovirus/enterovirus, procalcitonin is less than 0.10 - Blood cultures no growth at 5 days, sputum cultures canceled - Status post cap Treatment - Supportive care.   Acute hypoxic respiratory failure (HCC) Chronic hypercapnic RF  Likely OHS/OSA Continues to require 4 to 6 L of oxygen.  Patient had a syncopal episode when oxygen was removed and O2 sats in the 80s.  ABG showed normal pH with elevated pCO2 and normal O2 saturation on nonrebreather.  Suspect patient has a component of obesity hypoventilation syndrome and oxygenation may be worse with recent pneumonia. -Pulmonology was consulted given persistent hypoxia -Patient had desaturation event after removing her BiPAP.  Also noted to be more somnolent. -Recommending heated high flow for some positive pressure if patient removes BiPAP again. -Patient will require BiPAP on discharge and will need further titration of this in outpatient pulmonology clinic, -Supplemental O2 as needed to maintain SpO2 greater than 90%, wean as able   Suicidal ideations Resolved  - Patient currently has no suicidal  ideations.  Evaluated by psych.  Needs outpatient follow-up.  They have signed off.  Hx of seizure disorder - Continue home Keppra  and Depakote    Essential  hypertension Continue home metoprolol , given patient with tachycardia possibly due to withdrawal.  Continue to holdhome hydrochlorothiazide , clonidine , and prazosin  in setting of low normal blood pressure    Hypothyroidism - Continue home Synthroid    Hx of gastroesophageal reflux (GERD) - Protonix    Schizoaffective disorder (HCC) - Continue home Cogentin  and Cobenfy       PTSD (post-traumatic stress disorder) - Hold home clonidine  and prazosin    Depression - Continue home Lexapro , Seroquel , trazodone  - Continue to reduce home Seroquel  and trazodone  dose given drowsiness -Will ask psychiatry in the a.m. for medication recommendations   Intellectual disability - Patient has legal guardian for decision making   Morbid obesity (HCC) Body mass index is 52.18 kg/m.   Meets criteria for morbid obesity based on the presence of 1 or more chronic comorbidities. This complicates overall care and prognosis.       VTE prophylaxis:  Eliquis   GI prophylaxis: Protonix  Diet: Heart healthy Access: PIV Lines: None Code Status:  Full Code HCDM: Legal guardian Lorrin Soil Scientist: Yes             Admission status: Inpatient, Telemetry bed Patient is from: Group home Anticipated d/c is to: Group home or inpatient psychiatric facility Anticipated d/c date is: 1 to 2 days Patient currently: Receiving IV antibiotics, IV fluids, currently on supplemental O2, and awaiting inpatient psychiatric consultation     Family Communication:   none present  Consults called: Psychiatry     Subjective:  Patient removed BiPAP overnight and had desaturations in the low 80s.  She was also noted to be more somnolent. ABG was obtained and patient's pCO2 was increased.  She was placed on BiPAP.  She  is more awake on exam.  Objective: Vitals:   12/24/23 0500 12/24/23 0602 12/24/23 0752 12/24/23 1115  BP:   (!) 104/59   Pulse:  85 81   Resp:  19 16   Temp:   97.9 F (36.6 C) 98.3 F (36.8 C)   TempSrc:   Axillary Oral  SpO2:  95% 97%   Weight: (!) 141.4 kg     Height:        Intake/Output Summary (Last 24 hours) at 12/24/2023 1541 Last data filed at 12/24/2023 1039 Gross per 24 hour  Intake 360 ml  Output --  Net 360 ml   Filed Weights   12/16/23 1322 12/24/23 0500  Weight: (!) 137.9 kg (!) 141.4 kg    Examination:  Constitutional: In no distress.  Cardiovascular: Normal rate, regular rhythm. No lower extremity edema  Pulmonary: Non labored breathing on room air, no wheezing or rales.   Abdominal: Soft. Obese. Non distended and non tender Musculoskeletal: Normal range of motion.     Neurological: Alert and oriented to person, place, and time. Non focal  Skin: Skin is warm and dry.     Data Reviewed: I have personally reviewed following labs and imaging studies  CBC: Recent Labs  Lab 12/18/23 0827 12/20/23 0548 12/21/23 0538 12/22/23 1106 12/23/23 0329  WBC 7.6 7.3 7.0 8.2 7.7  HGB 11.2* 10.6* 11.2* 12.1 11.9*  HCT 37.6 35.5* 36.6 39.0 40.7  MCV 83.4 82.2 80.6 80.2 82.6  PLT 127* 132* 131* 156 172   Basic Metabolic Panel: Recent Labs  Lab 12/18/23 0827 12/21/23 0538 12/22/23 1106 12/23/23 0329  NA 142  139 138 138  K 3.7 4.3 4.6 4.5  CL 109 102 100 98  CO2 26 29 32 32  GLUCOSE 101* 81 117* 97  BUN 6 9 8 10   CREATININE 0.73 0.84 0.81 0.87  CALCIUM 7.7* 9.1 9.1 8.9   GFR: Estimated Creatinine Clearance: 138.3 mL/min (by C-G formula based on SCr of 0.87 mg/dL). Liver Function Tests: Recent Labs  Lab 12/22/23 1106  AST 28  ALT 22  ALKPHOS 49  BILITOT <0.2  PROT 6.7  ALBUMIN 3.1*    No results for input(s): LIPASE, AMYLASE in the last 168 hours. No results for input(s): AMMONIA in the last 168 hours. Coagulation Profile: No results for input(s): INR, PROTIME in the last 168 hours. Cardiac Enzymes: No results for input(s): CKTOTAL, CKMB, CKMBINDEX, TROPONINI in the last 168 hours. BNP (last 3 results) Recent  Labs    12/22/23 1221  PROBNP <50.0   HbA1C: No results for input(s): HGBA1C in the last 72 hours. CBG: Recent Labs  Lab 12/21/23 1412  GLUCAP 123*   Lipid Profile: No results for input(s): CHOL, HDL, LDLCALC, TRIG, CHOLHDL, LDLDIRECT in the last 72 hours. Thyroid Function Tests: No results for input(s): TSH, T4TOTAL, FREET4, T3FREE, THYROIDAB in the last 72 hours. Anemia Panel: No results for input(s): VITAMINB12, FOLATE, FERRITIN, TIBC, IRON , RETICCTPCT in the last 72 hours.  Sepsis Labs: No results for input(s): PROCALCITON, LATICACIDVEN in the last 168 hours.   Recent Results (from the past 240 hours)  Resp panel by RT-PCR (RSV, Flu A&B, Covid) Anterior Nasal Swab     Status: None   Collection Time: 12/16/23  1:28 PM   Specimen: Anterior Nasal Swab  Result Value Ref Range Status   SARS Coronavirus 2 by RT PCR NEGATIVE NEGATIVE Final    Comment: (NOTE) SARS-CoV-2 target nucleic acids are NOT DETECTED.  The SARS-CoV-2 RNA is generally detectable in upper respiratory specimens during the acute phase of infection. The lowest concentration of SARS-CoV-2 viral copies this assay can detect is 138 copies/mL. A negative result does not preclude SARS-Cov-2 infection and should not be used as the sole basis for treatment or other patient management decisions. A negative result may occur with  improper specimen collection/handling, submission of specimen other than nasopharyngeal swab, presence of viral mutation(s) within the areas targeted by this assay, and inadequate number of viral copies(<138 copies/mL). A negative result must be combined with clinical observations, patient history, and epidemiological information. The expected result is Negative.  Fact Sheet for Patients:  bloggercourse.com  Fact Sheet for Healthcare Providers:  seriousbroker.it  This test is no t yet approved or  cleared by the [place]  FDA and  has been authorized for detection and/or diagnosis of SARS-CoV-2 by FDA under an Emergency Use Authorization (EUA). This EUA will remain  in effect (meaning this test can be used) for the duration of the COVID-19 declaration under Section 564(b)(1) of the Act, 21 U.S.C.section 360bbb-3(b)(1), unless the authorization is terminated  or revoked sooner.       Influenza A by PCR NEGATIVE NEGATIVE Final   Influenza B by PCR NEGATIVE NEGATIVE Final    Comment: (NOTE) The Xpert Xpress SARS-CoV-2/FLU/RSV plus assay is intended as an aid in the diagnosis of influenza from Nasopharyngeal swab specimens and should not be used as a sole basis for treatment. Nasal washings and aspirates are unacceptable for Xpert Xpress SARS-CoV-2/FLU/RSV testing.  Fact Sheet for Patients: bloggercourse.com  Fact Sheet for Healthcare Providers: seriousbroker.it  This test is not  yet approved or cleared by the [place]  FDA and has been authorized for detection and/or diagnosis of SARS-CoV-2 by FDA under an Emergency Use Authorization (EUA). This EUA will remain in effect (meaning this test can be used) for the duration of the COVID-19 declaration under Section 564(b)(1) of the Act, 21 U.S.C. section 360bbb-3(b)(1), unless the authorization is terminated or revoked.     Resp Syncytial Virus by PCR NEGATIVE NEGATIVE Final    Comment: (NOTE) Fact Sheet for Patients: bloggercourse.com  Fact Sheet for Healthcare Providers: seriousbroker.it  This test is not yet approved or cleared by the [place]  FDA and has been authorized for detection and/or diagnosis of SARS-CoV-2 by FDA under an Emergency Use Authorization (EUA). This EUA will remain in effect (meaning this test can be used) for the duration of the COVID-19 declaration under Section 564(b)(1) of the Act, 21  U.S.C. section 360bbb-3(b)(1), unless the authorization is terminated or revoked.  Performed at [facility], [address] [place], [place] [address]   Culture, blood (routine x 2)     Status: None   Collection Time: 12/17/23 10:16 AM   Specimen: BLOOD  Result Value Ref Range Status   Specimen Description BLOOD BLOOD RIGHT HAND  Final   Special Requests   Final    BOTTLES DRAWN AEROBIC AND ANAEROBIC Blood Culture adequate volume   Culture   Final    NO GROWTH 5 DAYS Performed at [facility], [address] [place], [place] [address]    Report Status 12/22/2023 FINAL  Final  Culture, blood (routine x 2)     Status: None   Collection Time: 12/17/23 10:16 AM   Specimen: BLOOD  Result Value Ref Range Status   Specimen Description BLOOD BLOOD LEFT ARM  Final   Special Requests   Final    BOTTLES DRAWN AEROBIC AND ANAEROBIC Blood Culture adequate volume   Culture   Final    NO GROWTH 5 DAYS Performed at [facility], [address] [place], [place] [address]    Report Status 12/22/2023 FINAL  Final  Respiratory (~20 pathogens) panel by PCR     Status: Abnormal   Collection Time: 12/17/23 12:41 PM   Specimen: Nasopharyngeal Swab; Respiratory  Result Value Ref Range Status   Adenovirus NOT DETECTED NOT DETECTED Final   Coronavirus 229E NOT DETECTED NOT DETECTED Final    Comment: (NOTE) The Coronavirus on the Respiratory Panel, DOES NOT test for the novel  Coronavirus (2019 nCoV)    Coronavirus HKU1 NOT DETECTED NOT DETECTED Final   Coronavirus NL63 NOT DETECTED NOT DETECTED Final   Coronavirus OC43 NOT DETECTED NOT DETECTED Final   Metapneumovirus NOT DETECTED NOT DETECTED Final   Rhinovirus / Enterovirus DETECTED (A) NOT DETECTED Final   Influenza A NOT DETECTED NOT DETECTED Final   Influenza B NOT DETECTED NOT DETECTED Final   Parainfluenza Virus 1 NOT DETECTED NOT DETECTED Final   Parainfluenza Virus 2 NOT DETECTED NOT DETECTED Final    Parainfluenza Virus 3 NOT DETECTED NOT DETECTED Final   Parainfluenza Virus 4 NOT DETECTED NOT DETECTED Final   Respiratory Syncytial Virus NOT DETECTED NOT DETECTED Final   Bordetella pertussis NOT DETECTED NOT DETECTED Final   Bordetella Parapertussis NOT DETECTED NOT DETECTED Final   Chlamydophila pneumoniae NOT DETECTED NOT DETECTED Final   Mycoplasma pneumoniae NOT DETECTED NOT DETECTED Final    Comment: Performed at [facility], [address] [place], [place] [address]  Radiology Studies: No results found.    Scheduled Meds:  apixaban   5 mg Oral BID   benztropine   1 mg Oral BID   dextromethorphan -guaiFENesin   1 tablet Oral BID   divalproex   1,000 mg Oral BID   escitalopram   10 mg Oral Daily   [START ON 12/25/2023] ferrous sulfate   325 mg Oral Q breakfast   folic acid   1 mg Oral Daily   levETIRAcetam   500 mg Oral BID   levothyroxine   50 mcg Oral Q0600   LORazepam   0.5 mg Oral BID   metoprolol  succinate  50 mg Oral Daily   pantoprazole   80 mg Oral Daily   QUEtiapine   25 mg Oral QHS   senna-docusate  1 tablet Oral QHS   trazodone   100 mg Oral QHS   Xanomeline-Trospium  Chloride  1 capsule Oral BID   Continuous Infusions:    Alban Pepper, MD [facility] 12/24/2023, 3:41 PM

## 2023-12-24 NOTE — Plan of Care (Signed)

## 2023-12-25 ENCOUNTER — Ambulatory Visit: Payer: MEDICAID | Admitting: Family Medicine

## 2023-12-25 DIAGNOSIS — J9601 Acute respiratory failure with hypoxia: Secondary | ICD-10-CM | POA: Diagnosis not present

## 2023-12-25 DIAGNOSIS — B348 Other viral infections of unspecified site: Secondary | ICD-10-CM | POA: Diagnosis not present

## 2023-12-25 DIAGNOSIS — E662 Morbid (severe) obesity with alveolar hypoventilation: Secondary | ICD-10-CM | POA: Diagnosis not present

## 2023-12-25 LAB — URINALYSIS, COMPLETE (UACMP) WITH MICROSCOPIC
Bilirubin Urine: NEGATIVE
Glucose, UA: NEGATIVE mg/dL
Hgb urine dipstick: NEGATIVE
Ketones, ur: NEGATIVE mg/dL
Leukocytes,Ua: NEGATIVE
Nitrite: NEGATIVE
Protein, ur: NEGATIVE mg/dL
RBC / HPF: 0 RBC/hpf (ref 0–5)
Specific Gravity, Urine: 1.017 (ref 1.005–1.030)
pH: 6 (ref 5.0–8.0)

## 2023-12-25 LAB — BLOOD GAS, VENOUS
Acid-Base Excess: 10.2 mmol/L — ABNORMAL HIGH (ref 0.0–2.0)
Bicarbonate: 38.2 mmol/L — ABNORMAL HIGH (ref 20.0–28.0)
O2 Saturation: 50.4 %
Patient temperature: 37
pCO2, Ven: 66 mmHg — ABNORMAL HIGH (ref 44–60)
pH, Ven: 7.37 (ref 7.25–7.43)
pO2, Ven: 34 mmHg (ref 32–45)

## 2023-12-25 LAB — BASIC METABOLIC PANEL WITH GFR
Anion gap: 5 (ref 5–15)
BUN: 16 mg/dL (ref 6–20)
CO2: 34 mmol/L — ABNORMAL HIGH (ref 22–32)
Calcium: 9.2 mg/dL (ref 8.9–10.3)
Chloride: 99 mmol/L (ref 98–111)
Creatinine, Ser: 0.98 mg/dL (ref 0.44–1.00)
GFR, Estimated: >60 mL/min (ref >60)
Glucose, Bld: 95 mg/dL (ref 70–99)
Potassium: 4.5 mmol/L (ref 3.5–5.1)
Sodium: 139 mmol/L (ref 135–145)

## 2023-12-25 LAB — CBC
HCT: 39.7 % (ref 36.0–46.0)
Hemoglobin: 11.7 g/dL — ABNORMAL LOW (ref 12.0–15.0)
MCH: 24.5 pg — ABNORMAL LOW (ref 26.0–34.0)
MCHC: 29.5 g/dL — ABNORMAL LOW (ref 30.0–36.0)
MCV: 83.2 fL (ref 80.0–100.0)
Platelets: 175 K/uL (ref 150–400)
RBC: 4.77 MIL/uL (ref 3.87–5.11)
RDW: 21.9 % — ABNORMAL HIGH (ref 11.5–15.5)
WBC: 7.2 K/uL (ref 4.0–10.5)
nRBC: 0 % (ref 0.0–0.2)

## 2023-12-25 NOTE — Progress Notes (Signed)
 PROGRESS NOTE    Sherry Bryan  FMW:[id number] DOB: 18-Jun-1995 DOA: 12/16/2023 PCP: Pcp, No   Brief Narrative:   Sherry Bryan is a 28 y.o. year old female with past medical history of hypertension, hypothyroidism, seizure disorder, GERD, prior blood clots on Eliquis , major depressive disorder, DMDD, intellectual disability, borderline personality disorder, PTSD, and adjustment disorder.  She presented to [facility] ED yesterday with reports of suicidal ideation and productive cough.  Ultimately she was medically cleared and she remained in the ED voluntary status pending psychiatric evaluation.  This morning she reports that she has had a productive cough with green sputum, dyspnea, and chest pain associated with coughing for about 1 week.  She has not had any known sick contacts.     ED Course: This morning patient was noted to be afebrile temp 38.6 C, BP 98/71 and as low as 89/53 with MAP of 63, RR 21, SpO2 89% on room air and improving to 93% on 4 L via nasal cannula. CT head obtained and shows no acute intracranial abnormality with mucosal thickening and air-fluid levels in bilateral maxillary ethmoid sinuses and left sphenoid sinus.  CTA PE obtained and negative for PE, did show ground glass and streaky opacities in the lower lobes bilaterally. Labs notable for leukocytosis WBC 10.9 yesterday on ED arrival however improved to 7.7 this morning, lactic acid mildly elevated at 2.5, blood cultures obtained and in process.  BMP ordered not collected for this morning yesterday BMP grossly normal with mild hyperglycemia with glucose 125 and calcium 8.8.  COVID flu and RSV negative yesterday.  She was given Tylenol , Rocephin , doxycycline , and home meds including antihypertensives.  Patient was evaluated by telepsychiatry while in the ED who found patient to be at moderate risk of self-harm in the current environment and plan for reassessment this morning for final psychiatric disposition  recommendations.   Admitted by TRH.   Assessment & Plan:   Principal Problem:   Rhinovirus infection Active Problems:   Hx of seizure disorder   Hx of gastroesophageal reflux (GERD)   Intellectual disability   Suicidal ideations   Depression   Acute hypoxic respiratory failure (HCC)   Hypothyroidism   Essential hypertension   Morbid obesity (HCC)   Schizoaffective disorder (HCC)   PTSD (post-traumatic stress disorder)   *Pneumonia, rhinovirus (HCC) Sepsis r/o    Patient hypotensive with elevated lactic acid, hypoxia, CT showing bilateral lower lobe infiltrate concerning for pneumonia/atelectasis/aspiration -Viral panel positive for rhinovirus/enterovirus, procalcitonin is less than 0.10 - Blood cultures no growth at 5 days, sputum cultures canceled - Status post cap Treatment - Supportive care.   Acute hypoxic respiratory failure (HCC) Chronic hypercapnic RF  Likely OHS/OSA Continues to require 4 to 6 L of oxygen.  Patient had a syncopal episode when oxygen was removed and O2 sats in the 80s.  ABG showed normal pH with elevated pCO2 and normal O2 saturation on nonrebreather.  Suspect patient has a component of obesity hypoventilation syndrome and oxygenation may be worse with recent pneumonia. -Pulmonology was consulted given persistent hypoxia -Patient had desaturation event after removing her BiPAP.  Also noted to be more somnolent. -Recommending heated high flow for some positive pressure if patient removes BiPAP again. -Patient will require BiPAP on discharge and will need further titration of this in outpatient pulmonology clinic, -Supplemental O2 as needed to maintain SpO2 greater than 90%, wean as able   Suicidal ideations Resolved  - Patient currently has no suicidal ideations.  Evaluated by psych.  Needs outpatient follow-up.  They have signed off.  Hx of seizure disorder - Continue home Keppra  and Depakote    Essential hypertension Continue home metoprolol ,  given patient with tachycardia possibly due to withdrawal.  Continue to holdhome hydrochlorothiazide , clonidine , and prazosin  in setting of low normal blood pressure    Hypothyroidism - Continue home Synthroid   Normocytic anemia  Iron  deficiency    Latest Ref Rng & Units 12/25/2023    4:25 AM 12/23/2023    3:29 AM 12/22/2023   11:06 AM  CBC  WBC 4.0 - 10.5 K/uL 7.2  7.7  8.2   Hemoglobin 12.0 - 15.0 g/dL 88.2  88.0  87.8   Hematocrit 36.0 - 46.0 % 39.7  40.7  39.0   Platelets 150 - 400 K/uL 175  172  156    Hgb stable.    Hx of gastroesophageal reflux (GERD) - Protonix    Schizoaffective disorder (HCC) - Continue home Cogentin  and Cobenfy       PTSD (post-traumatic stress disorder) - Hold home clonidine  and prazosin    Depression - Continue home Lexapro , Seroquel , trazodone  - Continue to reduce home Seroquel  and trazodone  dose given drowsiness -Will ask psychiatry in the a.m. for medication recommendations   Intellectual disability - Patient has legal guardian for decision making   Morbid obesity (HCC) Body mass index is 52.18 kg/m.   Meets criteria for morbid obesity based on the presence of 1 or more chronic comorbidities. This complicates overall care and prognosis.       VTE prophylaxis:  Eliquis   GI prophylaxis: Protonix  Diet: Heart healthy Access: PIV Lines: None Code Status:  Full Code HCDM: Legal guardian Lorrin Soil Scientist: Yes             Admission status: Inpatient, Telemetry bed Patient is from: Group home Anticipated d/c is to: Group home or inpatient psychiatric facility Anticipated d/c date is: 1 to 2 days Patient currently: Receiving IV antibiotics, IV fluids, currently on supplemental O2, and awaiting inpatient psychiatric consultation     Family Communication:   none present  Consults called: Psychiatry     Subjective:  Patient continues not to wear BIPAP at night. HHFNC.   Objective: Vitals:   12/25/23 0800 12/25/23 1200  12/25/23 1600 12/25/23 1700  BP:  105/67 93/76   Pulse:  79 75   Resp:  16 13 16   Temp: 98.1 F (36.7 C) 98.7 F (37.1 C) 98.3 F (36.8 C)   TempSrc:      SpO2:  90% 94%   Weight:      Height:        Intake/Output Summary (Last 24 hours) at 12/25/2023 1821 Last data filed at 12/24/2023 1920 Gross per 24 hour  Intake 360 ml  Output --  Net 360 ml   Filed Weights   12/16/23 1322 12/24/23 0500  Weight: (!) 137.9 kg (!) 141.4 kg    Examination:  Physical Exam  Constitutional: In no distress.  Cardiovascular: Normal rate, regular rhythm. No lower extremity edema  Pulmonary: Non labored breathing on [place], no wheezing or rales.   Abdominal: Soft. Obese Non distended and non tender Musculoskeletal: Normal range of motion.     Neurological: Alert and oriented to person, place, and time. drowsy Skin: Skin is warm and dry.      Data Reviewed: I have personally reviewed following labs and imaging studies  CBC: Recent Labs  Lab 12/20/23 0548 12/21/23 0538 12/22/23 1106 12/23/23 0329 12/25/23 0425  WBC 7.3 7.0 8.2 7.7  7.2  HGB 10.6* 11.2* 12.1 11.9* 11.7*  HCT 35.5* 36.6 39.0 40.7 39.7  MCV 82.2 80.6 80.2 82.6 83.2  PLT 132* 131* 156 172 175   Basic Metabolic Panel: Recent Labs  Lab 12/21/23 0538 12/22/23 1106 12/23/23 0329 12/25/23 0425  NA 139 138 138 139  K 4.3 4.6 4.5 4.5  CL 102 100 98 99  CO2 29 32 32 34*  GLUCOSE 81 117* 97 95  BUN 9 8 10 16   CREATININE 0.84 0.81 0.87 0.98  CALCIUM 9.1 9.1 8.9 9.2   GFR: Estimated Creatinine Clearance: 122.8 mL/min (by C-G formula based on SCr of 0.98 mg/dL). Liver Function Tests: Recent Labs  Lab 12/22/23 1106  AST 28  ALT 22  ALKPHOS 49  BILITOT <0.2  PROT 6.7  ALBUMIN 3.1*    No results for input(s): LIPASE, AMYLASE in the last 168 hours. No results for input(s): AMMONIA in the last 168 hours. Coagulation Profile: No results for input(s): INR, PROTIME in the last 168 hours. Cardiac  Enzymes: No results for input(s): CKTOTAL, CKMB, CKMBINDEX, TROPONINI in the last 168 hours. BNP (last 3 results) Recent Labs    12/22/23 1221  PROBNP <50.0   HbA1C: No results for input(s): HGBA1C in the last 72 hours. CBG: Recent Labs  Lab 12/21/23 1412  GLUCAP 123*   Lipid Profile: No results for input(s): CHOL, HDL, LDLCALC, TRIG, CHOLHDL, LDLDIRECT in the last 72 hours. Thyroid Function Tests: No results for input(s): TSH, T4TOTAL, FREET4, T3FREE, THYROIDAB in the last 72 hours. Anemia Panel: No results for input(s): VITAMINB12, FOLATE, FERRITIN, TIBC, IRON , RETICCTPCT in the last 72 hours.  Sepsis Labs: No results for input(s): PROCALCITON, LATICACIDVEN in the last 168 hours.   Recent Results (from the past 240 hours)  Resp panel by RT-PCR (RSV, Flu A&B, Covid) Anterior Nasal Swab     Status: None   Collection Time: 12/16/23  1:28 PM   Specimen: Anterior Nasal Swab  Result Value Ref Range Status   SARS Coronavirus 2 by RT PCR NEGATIVE NEGATIVE Final    Comment: (NOTE) SARS-CoV-2 target nucleic acids are NOT DETECTED.  The SARS-CoV-2 RNA is generally detectable in upper respiratory specimens during the acute phase of infection. The lowest concentration of SARS-CoV-2 viral copies this assay can detect is 138 copies/mL. A negative result does not preclude SARS-Cov-2 infection and should not be used as the sole basis for treatment or other patient management decisions. A negative result may occur with  improper specimen collection/handling, submission of specimen other than nasopharyngeal swab, presence of viral mutation(s) within the areas targeted by this assay, and inadequate number of viral copies(<138 copies/mL). A negative result must be combined with clinical observations, patient history, and epidemiological information. The expected result is Negative.  Fact Sheet for Patients:   bloggercourse.com  Fact Sheet for Healthcare Providers:  seriousbroker.it  This test is no t yet approved or cleared by the [place]  FDA and  has been authorized for detection and/or diagnosis of SARS-CoV-2 by FDA under an Emergency Use Authorization (EUA). This EUA will remain  in effect (meaning this test can be used) for the duration of the COVID-19 declaration under Section 564(b)(1) of the Act, 21 U.S.C.section 360bbb-3(b)(1), unless the authorization is terminated  or revoked sooner.       Influenza A by PCR NEGATIVE NEGATIVE Final   Influenza B by PCR NEGATIVE NEGATIVE Final    Comment: (NOTE) The Xpert Xpress SARS-CoV-2/FLU/RSV plus assay is intended as an aid  in the diagnosis of influenza from Nasopharyngeal swab specimens and should not be used as a sole basis for treatment. Nasal washings and aspirates are unacceptable for Xpert Xpress SARS-CoV-2/FLU/RSV testing.  Fact Sheet for Patients: bloggercourse.com  Fact Sheet for Healthcare Providers: seriousbroker.it  This test is not yet approved or cleared by the [place]  FDA and has been authorized for detection and/or diagnosis of SARS-CoV-2 by FDA under an Emergency Use Authorization (EUA). This EUA will remain in effect (meaning this test can be used) for the duration of the COVID-19 declaration under Section 564(b)(1) of the Act, 21 U.S.C. section 360bbb-3(b)(1), unless the authorization is terminated or revoked.     Resp Syncytial Virus by PCR NEGATIVE NEGATIVE Final    Comment: (NOTE) Fact Sheet for Patients: bloggercourse.com  Fact Sheet for Healthcare Providers: seriousbroker.it  This test is not yet approved or cleared by the [place]  FDA and has been authorized for detection and/or diagnosis of SARS-CoV-2 by FDA under an Emergency Use  Authorization (EUA). This EUA will remain in effect (meaning this test can be used) for the duration of the COVID-19 declaration under Section 564(b)(1) of the Act, 21 U.S.C. section 360bbb-3(b)(1), unless the authorization is terminated or revoked.  Performed at [facility], [address] [place], [place] [address]   Culture, blood (routine x 2)     Status: None   Collection Time: 12/17/23 10:16 AM   Specimen: BLOOD  Result Value Ref Range Status   Specimen Description BLOOD BLOOD RIGHT HAND  Final   Special Requests   Final    BOTTLES DRAWN AEROBIC AND ANAEROBIC Blood Culture adequate volume   Culture   Final    NO GROWTH 5 DAYS Performed at [facility], [address] [place], [place] [address]    Report Status 12/22/2023 FINAL  Final  Culture, blood (routine x 2)     Status: None   Collection Time: 12/17/23 10:16 AM   Specimen: BLOOD  Result Value Ref Range Status   Specimen Description BLOOD BLOOD LEFT ARM  Final   Special Requests   Final    BOTTLES DRAWN AEROBIC AND ANAEROBIC Blood Culture adequate volume   Culture   Final    NO GROWTH 5 DAYS Performed at [facility], [address] [place], [place] [address]    Report Status 12/22/2023 FINAL  Final  Respiratory (~20 pathogens) panel by PCR     Status: Abnormal   Collection Time: 12/17/23 12:41 PM   Specimen: Nasopharyngeal Swab; Respiratory  Result Value Ref Range Status   Adenovirus NOT DETECTED NOT DETECTED Final   Coronavirus 229E NOT DETECTED NOT DETECTED Final    Comment: (NOTE) The Coronavirus on the Respiratory Panel, DOES NOT test for the novel  Coronavirus (2019 nCoV)    Coronavirus HKU1 NOT DETECTED NOT DETECTED Final   Coronavirus NL63 NOT DETECTED NOT DETECTED Final   Coronavirus OC43 NOT DETECTED NOT DETECTED Final   Metapneumovirus NOT DETECTED NOT DETECTED Final   Rhinovirus / Enterovirus DETECTED (A) NOT DETECTED Final   Influenza A NOT DETECTED NOT DETECTED  Final   Influenza B NOT DETECTED NOT DETECTED Final   Parainfluenza Virus 1 NOT DETECTED NOT DETECTED Final   Parainfluenza Virus 2 NOT DETECTED NOT DETECTED Final   Parainfluenza Virus 3 NOT DETECTED NOT DETECTED Final   Parainfluenza Virus 4 NOT DETECTED NOT DETECTED Final   Respiratory Syncytial Virus NOT DETECTED NOT DETECTED Final   Bordetella pertussis NOT DETECTED NOT  DETECTED Final   Bordetella Parapertussis NOT DETECTED NOT DETECTED Final   Chlamydophila pneumoniae NOT DETECTED NOT DETECTED Final   Mycoplasma pneumoniae NOT DETECTED NOT DETECTED Final    Comment: Performed at [facility], [address], [place], [place] [address]         Radiology Studies: No results found.    Scheduled Meds:  apixaban   5 mg Oral BID   benztropine   1 mg Oral BID   dextromethorphan -guaiFENesin   1 tablet Oral BID   divalproex   1,000 mg Oral BID   escitalopram   10 mg Oral Daily   ferrous sulfate   325 mg Oral Q breakfast   folic acid   1 mg Oral Daily   lactulose   20 g Oral Daily   levETIRAcetam   500 mg Oral BID   levothyroxine   50 mcg Oral Q0600   LORazepam   0.5 mg Oral BID   metoprolol  succinate  50 mg Oral Daily   pantoprazole   80 mg Oral Daily   QUEtiapine   12.5 mg Oral QHS   senna-docusate  1 tablet Oral QHS   trazodone   75 mg Oral QHS   Xanomeline-Trospium  Chloride  1 capsule Oral BID   Continuous Infusions:    Alban Pepper, MD [facility] 12/25/2023, 6:21 PM

## 2023-12-25 NOTE — Progress Notes (Signed)
 PULMONOLOGY         Date: 12/25/2023,   MRN# [id number] Sherry Bryan [birth year]     AdmissionWeight: (!) 137.9 kg                 CurrentWeight: (!) 141.4 kg  Referring provider: Dr. Franchot   CHIEF COMPLAINT:   Hypercapnic hypoxemic respiratory failure   HISTORY OF PRESENT ILLNESS   This is a 28 year old female with a history of borderline personality disorder, chronic constipation, major depressive disorder, disruptive mood dysregulation disorder, GERD, history of seizure disorder, intellectual disability, pulmonary embolism, morbid obesity posttraumatic stress disorder, suicidal ideation, who was brought in with respiratory distress and a productive cough significant for expectoration of dark phlegm.  On arrival to the emergency department she was hypoxemic requiring 4 L via nasal cannula to reach 89% and was hypotensive with blood pressure less than 100 systolic.  She did have a CT chest obtained on arrival to the ER with ground glass opacification bilaterally but negative for PE.  She is found to be parainfluenza positive.  CT chest with bilateral infiltrates, ABG with hypercapnia and hypoxemia.  She is being evaluated by psychiatry due to self-harm and history of suicidal ideation with multiple psychiatric diagnoses.  PCCM consultation for progressive hypercapnic respiratory failure with hypoxemia potentially needing home BiPAP.  12/25/23- patient is on HFNC resting in bed in no acute distress. She may be seen in pulmonary clinic post dc home.  PCCM will sign off today.  BIPAP has been ordered for home use. She currently is being treated for rhinovirus associated respiratory failure with hypercapnia and hypoxemia.   PAST MEDICAL HISTORY   Past Medical History:  Diagnosis Date   Borderline personality disorder (HCC)    Constipation 05/15/2015   Depression    DMDD (disruptive mood dysregulation disorder) 05/15/2015   History of seasonal allergies 05/15/2015   Hx of  gastroesophageal reflux (GERD) 05/15/2015   Hx of seizure disorder 05/15/2015   Intellectual disability 05/22/2015   PTSD (post-traumatic stress disorder)    Seizures (HCC)    last one in 2015   Suicidal ideation      SURGICAL HISTORY   History reviewed. No pertinent surgical history.   FAMILY HISTORY   History reviewed. No pertinent family history.   SOCIAL HISTORY   Social History[1]   MEDICATIONS     Current Medication: Current Medications[2]    ALLERGIES   Ritalin [methylphenidate hcl], Abilify [aripiprazole], and Lithium      REVIEW OF SYSTEMS    Review of Systems:  Gen:  Denies  fever, sweats, chills weigh loss  HEENT: Denies blurred vision, double vision, ear pain, eye pain, hearing loss, nose bleeds, sore throat Cardiac:  No dizziness, chest pain or heaviness, chest tightness,edema Resp:   reports dyspnea chronically  Gi: Denies swallowing difficulty, stomach pain, nausea or vomiting, diarrhea, constipation, bowel incontinence Gu:  Denies bladder incontinence, burning urine Ext:   Denies Joint pain, stiffness or swelling Skin: Denies  skin rash, easy bruising or bleeding or hives Endoc:  Denies polyuria, polydipsia , polyphagia or weight change Psych:   Denies depression, insomnia or hallucinations   Other:  All other systems negative   VS: BP 108/65 (BP Location: Right Arm)   Pulse 81   Temp 97.8 F (36.6 C) (Axillary)   Resp (!) 21   Ht 5' 4 (1.626 m)   Wt (!) 141.4 kg   SpO2 92%   BMI 53.51 kg/m  PHYSICAL EXAM    GENERAL:NAD, no fevers, chills, no weakness no fatigue HEAD: Normocephalic, atraumatic.  EYES: Pupils equal, round, reactive to light. Extraocular muscles intact. No scleral icterus.  MOUTH: Moist mucosal membrane. Dentition intact. No abscess noted.  EAR, NOSE, THROAT: Clear without exudates. No external lesions.  NECK: Supple. No thyromegaly. No nodules. No JVD.  PULMONARY: decreased breath sounds with mild rhonchi  worse at bases bilaterally.  CARDIOVASCULAR: S1 and S2. Regular rate and rhythm. No murmurs, rubs, or gallops. No edema. Pedal pulses 2+ bilaterally.  GASTROINTESTINAL: Soft, nontender, nondistended. No masses. Positive bowel sounds. No hepatosplenomegaly.  MUSCULOSKELETAL: No swelling, clubbing, or edema. Range of motion full in all extremities.  NEUROLOGIC: Cranial nerves II through XII are intact. No gross focal neurological deficits. Sensation intact. Reflexes intact.  SKIN: No ulceration, lesions, rashes, or cyanosis. Skin warm and dry. Turgor intact.  PSYCHIATRIC: Mood, affect within normal limits. The patient is awake, alert and oriented x 3. Insight, judgment intact.       IMAGING    CT Head Wo Contrast Final result 12/17/2023 10:52 AM        EXAM: CT HEAD WITHOUT CONTRAST 12/17/2023 10:52:44 AM  TECHNIQUE: CT of the head was performed without the administration of intravenous contrast. Automated exposure control, iterative reconstruction, and/or weight based adjustment of the mA/kV was utilized to reduce the radiation dose to as low as reasonably achievable.  ...        Study Result  Narrative & Impression  EXAM: CTA of the Chest without and with contrast for PE 12/17/2023 10:52:44 AM   TECHNIQUE: CTA of the chest was performed without and with the administration of 75 mL of iohexol  (OMNIPAQUE ) 350 MG/ML intravenous contrast. Multiplanar reformatted images are provided for review. MIP images are provided for review. Automated exposure control, iterative reconstruction, and/or weight based adjustment of the mA/kV was utilized to reduce the radiation dose to as low as reasonably achievable.   COMPARISON: None available.   CLINICAL HISTORY: Pulmonary embolism (PE) suspected, high prob.   FINDINGS:   PULMONARY ARTERIES: Pulmonary arteries are adequately opacified for evaluation. No pulmonary embolism. Main pulmonary artery is normal in caliber.    MEDIASTINUM: The heart and pericardium demonstrate no acute abnormality. There is no acute abnormality of the thoracic aorta.   LYMPH NODES: No mediastinal, hilar or axillary lymphadenopathy.   LUNGS AND PLEURA: There are ground glass and streaky opacities present within the lower lobes bilaterally. The nondependent lungs are relatively clear. No pleural effusion or pneumothorax.   UPPER ABDOMEN: Limited images of the upper abdomen are unremarkable.   SOFT TISSUES AND BONES: No acute bone or soft tissue abnormality.   IMPRESSION: 1. No pulmonary embolism. 2. Ground-glass and streaky opacities in the lower lobes bilaterally, which may reflect atelectasis, aspiration, or atypical/viral pneumonia; correlate clinically.   Electronically signed by: Evalene Coho MD 12/17/2023 11:00 AM EST RP Workstation: [id number]      ASSESSMENT/PLAN   Hypercapnic hypoxemic respiratory failure    Patient qualifies for BIPAP and likely needs it long term due to morbid obesity and OSA overlap  -have placed BIPAP for home use - parainfluenza is most likely cause of ground glass opacities on CT chest but this would not explain the hypercapnic failure - I can follow up with her on outpatient to check settings and make adjustments -PCO2 >55 on ABG        Thank you for allowing me to participate in the care of this patient.  Patient/Family are satisfied with care plan and all questions have been answered.    Provider disclosure: Patient with at least one acute or chronic illness or injury that poses a threat to life or bodily function and is being managed actively during this encounter.  All of the below services have been performed independently by signing provider:  review of prior documentation from internal and or external health records.  Review of previous and current lab results.  Interview and comprehensive assessment during patient visit today. Review of current and previous  chest radiographs/CT scans. Discussion of management and test interpretation with health care team and patient/family.   This document was prepared using Dragon voice recognition software and may include unintentional dictation errors.     Karrina Lye, M.D.  Division of Pulmonary & Critical Care Medicine              [1]  Social History Tobacco Use   Smoking status: Former    Types: Cigarettes   Smokeless tobacco: Never  Vaping Use   Vaping status: Every Day  Substance Use Topics   Alcohol use: No   Drug use: No  [2]  Current Facility-Administered Medications:    acetaminophen  (TYLENOL ) tablet 650 mg, 650 mg, Oral, Q6H PRN, 650 mg at 12/18/23 2141 **OR** acetaminophen  (TYLENOL ) suppository 650 mg, 650 mg, Rectal, Q6H PRN, Foust, Katy L, NP   albuterol  (PROVENTIL ) (2.5 MG/3ML) 0.083% nebulizer solution 2.5 mg, 2.5 mg, Nebulization, Q2H PRN, Foust, Katy L, NP, 2.5 mg at 12/24/23 2040   apixaban  (ELIQUIS ) tablet 5 mg, 5 mg, Oral, BID, Jessup, Charles, MD, 5 mg at 12/24/23 2119   benztropine  (COGENTIN ) tablet 1 mg, 1 mg, Oral, BID, Jessup, Charles, MD, 1 mg at 12/24/23 2119   dextromethorphan -guaiFENesin  (MUCINEX  DM) 30-600 MG per 12 hr tablet 1 tablet, 1 tablet, Oral, BID, Franchot Novel, MD, 1 tablet at 12/24/23 2118   divalproex  (DEPAKOTE ) DR tablet 1,000 mg, 1,000 mg, Oral, BID, Jessup, Charles, MD, 1,000 mg at 12/24/23 2118   escitalopram  (LEXAPRO ) tablet 10 mg, 10 mg, Oral, Daily, Jessup, Charles, MD, 10 mg at 12/24/23 0930   ferrous sulfate  tablet 325 mg, 325 mg, Oral, Q breakfast, Franchot Novel, MD   folic acid  (FOLVITE ) tablet 1 mg, 1 mg, Oral, Daily, Willo Dunnings, MD, 1 mg at 12/24/23 0930   lactulose  (CHRONULAC ) 10 GM/15ML solution 20 g, 20 g, Oral, Daily, Franchot Novel, MD   levETIRAcetam  (KEPPRA ) tablet 500 mg, 500 mg, Oral, BID, Jessup, Charles, MD, 500 mg at 12/24/23 2119   levothyroxine  (SYNTHROID ) tablet 50 mcg, 50 mcg, Oral, Q0600, Dail Rankin RAMAN, RPH, 50 mcg at 12/25/23 9478   LORazepam  (ATIVAN ) tablet 0.5 mg, 0.5 mg, Oral, BID, Franchot Novel, MD, 0.5 mg at 12/24/23 2119   metoprolol  succinate (TOPROL -XL) 24 hr tablet 50 mg, 50 mg, Oral, Daily, Franchot Novel, MD, 50 mg at 12/24/23 0930   ondansetron  (ZOFRAN ) tablet 4 mg, 4 mg, Oral, Q6H PRN, 4 mg at 12/22/23 1124 **OR** ondansetron  (ZOFRAN ) injection 4 mg, 4 mg, Intravenous, Q6H PRN, Foust, Katy L, NP, 4 mg at 12/20/23 2227   pantoprazole  (PROTONIX ) EC tablet 80 mg, 80 mg, Oral, Daily, Jessup, Charles, MD, 80 mg at 12/24/23 0930   QUEtiapine  (SEROQUEL ) tablet 12.5 mg, 12.5 mg, Oral, QHS, Franchot Novel, MD, 12.5 mg at 12/24/23 2119   senna-docusate (Senokot-S) tablet 1 tablet, 1 tablet, Oral, QHS, Franchot Novel, MD, 1 tablet at 12/24/23 2119   traZODone  (DESYREL ) tablet 75 mg, 75 mg, Oral,  QHS, Carter, [place], MD, 75 mg at 12/24/23 2120   Xanomeline-Trospium  Chloride 125-30 MG CAPS 1 capsule, 1 capsule, Oral, BID, Niels Kayla FALCON, [facility], 1 capsule at 12/24/23 [phone]

## 2023-12-25 NOTE — Progress Notes (Signed)
 In and out cath. output. Urine sample sent to lab.

## 2023-12-25 NOTE — Progress Notes (Signed)
 Patient refused the BIPAP tonight.

## 2023-12-25 NOTE — Progress Notes (Signed)
 Occupational Therapy Treatment Patient Details Name: Sherry Bryan MRN: [id number] DOB: 17-May-1995 Today's Date: 12/25/2023   History of present illness Pt is a 28 year old female admitted with sepsis due to pneumonia, acute hypoxic respiratory failure; of note, pt presented to ED with suicidal ideations, Patient currently has no suicidal ideations    PMH significant for  hypertension, hypothyroidism, seizure disorder, GERD, prior blood clots on Eliquis , major depressive disorder, DMDD, intellectual disability, borderline personality disorder, PTSD, and adjustment disorder   OT comments  Chart reviewed to date, pt greeted semi supine in bed. She is initially extremely lethargic, improved with position changes but still lethargic throughout. She requires step by step-frequent multi modal cues for her eyes to remain open for task participation. Two attempts at bed mobility completed with MIN A. Poor static sitting balance initially, improved to fair with cueing. MIN A required for STS, CGA required for step pivot to and from bsc. MIN A for toileting. Pt continues to perform ADL/functional mobility below PLOF, will benefit from ongoing OT to address functional deficits.   VSS on HHFNC 50L Fi02 70% throughout       If plan is discharge home, recommend the following:  Supervision due to cognitive status;Direct supervision/assist for financial management;Direct supervision/assist for medications management;Assistance with cooking/housework;Help with stairs or ramp for entrance;A lot of help with walking and/or transfers;A lot of help with bathing/dressing/bathroom   Equipment Recommendations  BSC/3in1;Tub/shower seat (bariatric)    Recommendations for Other Services      Precautions / Restrictions Precautions Precautions: Fall Recall of Precautions/Restrictions: Impaired Restrictions Weight Bearing Restrictions Per Provider Order: No       Mobility Bed Mobility Overal bed mobility:  Needs Assistance Bed Mobility: Supine to Sit, Sit to Supine     Supine to sit: Min assist Sit to supine: Min assist        Transfers Overall transfer level: Needs assistance Equipment used: None Transfers: Sit to/from Stand Sit to Stand: Min assist                 Balance Overall balance assessment: Needs assistance Sitting-balance support: Feet supported Sitting balance-Leahy Scale: Poor Sitting balance - Comments: initially pt with posterior lean, attempting to lie back down, improved with increased time upright and cueing Postural control: Posterior lean   Standing balance-Leahy Scale: Fair                             ADL either performed or assessed with clinical judgement   ADL Overall ADL's : Needs assistance/impaired                     Lower Body Dressing: Maximal assistance   Toilet Transfer: Contact guard assist;BSC/3in1 Toilet Transfer Details (indicate cue type and reason): short amb transfer to bsc Toileting- Clothing Manipulation and Hygiene: Minimal assistance;Sitting/lateral lean Toileting - Clothing Manipulation Details (indicate cue type and reason): continent urine on toilet            Extremity/Trunk Assessment              Vision       Perception     Praxis     Communication Communication Communication: Impaired Factors Affecting Communication: Difficulty expressing self   Cognition Arousal: Lethargic Behavior During Therapy: Flat affect Cognition: History of cognitive impairments, Cognition impaired, No family/caregiver present to determine baseline   Orientation impairments: Time, Situation Awareness: Intellectual awareness impaired, Online awareness impaired  Memory impairment (select all impairments): Short-term memory, Declarative long-term memory Attention impairment (select first level of impairment): Sustained attention Executive functioning impairment (select all impairments): Sequencing,  Reasoning, Problem solving, Initiation                   Following commands: Impaired Following commands impaired: Follows one step commands with increased time (and multi modal cueing)      Cueing   Cueing Techniques: Verbal cues, Tactile cues  Exercises Other Exercises Other Exercises: edu re role of OT, role of rehab, importance of continued mobility attempts    Shoulder Instructions       General Comments vss on 50 L via HHFNC, Fi02 70%    Pertinent Vitals/ Pain       Pain Assessment Pain Assessment: Faces Faces Pain Scale: Hurts a little bit Pain Location: R thigh Pain Descriptors / Indicators: Discomfort Pain Intervention(s): Repositioned, Monitored during session, Limited activity within patient's tolerance  Home Living                                          Prior Functioning/Environment              Frequency  Min 2X/week        Progress Toward Goals  OT Goals(current goals can now be found in the care plan section)  Progress towards OT goals: Progressing toward goals  Acute Rehab OT Goals Time For Goal Achievement: 01/03/24  Plan      Co-evaluation                 AM-PAC OT 6 Clicks Daily Activity     Outcome Measure   Help from another person eating meals?: A Little Help from another person taking care of personal grooming?: A Little Help from another person toileting, which includes using toliet, bedpan, or urinal?: A Lot Help from another person bathing (including washing, rinsing, drying)?: A Lot Help from another person to put on and taking off regular upper body clothing?: A Lot Help from another person to put on and taking off regular lower body clothing?: A Lot 6 Click Score: 14    End of Session Equipment Utilized During Treatment: Oxygen  OT Visit Diagnosis: Other abnormalities of gait and mobility (R26.89)   Activity Tolerance Patient limited by lethargy   Patient Left in bed;with call  bell/phone within reach;with bed alarm set   Nurse Communication Mobility status        Time: 8679-8647 OT Time Calculation (min): 32 min  Charges: OT General Charges $OT Visit: 1 Visit OT Treatments $Self Care/Home Management : 8-22 mins $Therapeutic Activity: 8-22 mins  Therisa Sheffield, OTD OTR/L  12/25/2023, 3:51 PM

## 2023-12-25 NOTE — TOC Progression Note (Signed)
 Transition of Care [facility]) - Progression Note    Patient Details  Name: Sherry Bryan MRN: [id number] Date of Birth: 1995-01-15  [facility]) CM/SW Contact  Shasta DELENA Daring, RN Phone Number: 12/25/2023, 4:49 PM  Clinical Narrative:     4:49 PM  Spoke with Amy from [facility]. She affirmed that they can handle bi-pap at night. She said they would try to assist patient with compliance.   Advised her that we have made many phone calls to them today but have been unsuccessful in reaching someone until now. She said she will answer phone calls from us  tomorrow morning if we call from the same number.  Sent message to MD about bi-pap.                     Expected Discharge Plan and Services                                               Social Drivers of Health (SDOH) Interventions SDOH Screenings   Food Insecurity: No Food Insecurity (12/19/2023)  Housing: Patient Unable To Answer (12/20/2023)  Transportation Needs: Patient Unable To Answer (12/20/2023)  Utilities: Not At Risk (12/19/2023)  Tobacco Use: Medium Risk (12/16/2023)    Readmission Risk Interventions     No data to display

## 2023-12-26 DIAGNOSIS — E662 Morbid (severe) obesity with alveolar hypoventilation: Secondary | ICD-10-CM

## 2023-12-26 DIAGNOSIS — J9601 Acute respiratory failure with hypoxia: Secondary | ICD-10-CM

## 2023-12-26 DIAGNOSIS — B348 Other viral infections of unspecified site: Secondary | ICD-10-CM | POA: Diagnosis not present

## 2023-12-26 LAB — BLOOD GAS, VENOUS
Acid-Base Excess: 10.8 mmol/L — ABNORMAL HIGH (ref 0.0–2.0)
Bicarbonate: 37.6 mmol/L — ABNORMAL HIGH (ref 20.0–28.0)
O2 Saturation: 93.1 %
Patient temperature: 37
pCO2, Ven: 58 mmHg (ref 44–60)
pH, Ven: 7.42 (ref 7.25–7.43)
pO2, Ven: 62 mmHg — ABNORMAL HIGH (ref 32–45)

## 2023-12-26 LAB — BASIC METABOLIC PANEL WITH GFR
Anion gap: 5 (ref 5–15)
BUN: 16 mg/dL (ref 6–20)
CO2: 31 mmol/L (ref 22–32)
Calcium: 9.2 mg/dL (ref 8.9–10.3)
Chloride: 101 mmol/L (ref 98–111)
Creatinine, Ser: 0.94 mg/dL (ref 0.44–1.00)
GFR, Estimated: >60 mL/min (ref >60)
Glucose, Bld: 83 mg/dL (ref 70–99)
Potassium: 4.4 mmol/L (ref 3.5–5.1)
Sodium: 137 mmol/L (ref 135–145)

## 2023-12-26 LAB — CBC
HCT: 37.2 % (ref 36.0–46.0)
Hemoglobin: 11.2 g/dL — ABNORMAL LOW (ref 12.0–15.0)
MCH: 24.7 pg — ABNORMAL LOW (ref 26.0–34.0)
MCHC: 30.1 g/dL (ref 30.0–36.0)
MCV: 82.1 fL (ref 80.0–100.0)
Platelets: 190 K/uL (ref 150–400)
RBC: 4.53 MIL/uL (ref 3.87–5.11)
RDW: 22.4 % — ABNORMAL HIGH (ref 11.5–15.5)
WBC: 7.1 K/uL (ref 4.0–10.5)
nRBC: 0 % (ref 0.0–0.2)

## 2023-12-26 MED ORDER — BENZTROPINE MESYLATE 1 MG PO TABS
1.0000 mg | ORAL_TABLET | Freq: Every day | ORAL | Status: DC
  Administered 2023-12-26 – 2024-01-04 (×10): 1 mg via ORAL
  Filled 2023-12-26 (×10): qty 1

## 2023-12-26 MED ORDER — INFLUENZA VIRUS VACC SPLIT PF (FLUZONE) 0.5 ML IM SUSY
0.5000 mL | PREFILLED_SYRINGE | INTRAMUSCULAR | Status: AC
  Administered 2023-12-27: 10:00:00 0.5 mL via INTRAMUSCULAR
  Filled 2023-12-26: qty 0.5

## 2023-12-26 MED ORDER — BENZTROPINE MESYLATE 1 MG PO TABS
0.5000 mg | ORAL_TABLET | Freq: Every day | ORAL | Status: DC
  Administered 2023-12-26 – 2024-01-05 (×10): 0.5 mg via ORAL
  Filled 2023-12-26 (×11): qty 1

## 2023-12-26 MED ORDER — LORAZEPAM 0.5 MG PO TABS
0.5000 mg | ORAL_TABLET | Freq: Every day | ORAL | Status: DC
  Administered 2023-12-27 – 2024-01-04 (×9): 0.5 mg via ORAL
  Filled 2023-12-26 (×9): qty 1

## 2023-12-26 MED ORDER — BISACODYL 10 MG RE SUPP
10.0000 mg | Freq: Once | RECTAL | Status: AC
  Administered 2023-12-26: 14:00:00 10 mg via RECTAL
  Filled 2023-12-26: qty 1

## 2023-12-26 MED ORDER — METOPROLOL SUCCINATE ER 25 MG PO TB24
25.0000 mg | ORAL_TABLET | Freq: Every day | ORAL | Status: DC
  Administered 2023-12-26 – 2023-12-28 (×2): 25 mg via ORAL
  Filled 2023-12-26 (×3): qty 1

## 2023-12-26 NOTE — TOC Progression Note (Addendum)
 Transition of Care [facility]) - Progression Note    Patient Details  Name: Sherry Bryan MRN: [id number] Date of Birth: Sep 29, 1995  [facility]) CM/SW Contact  Shasta DELENA Daring, RN Phone Number: 12/26/2023, 10:56 AM  Clinical Narrative:     10:56 AM Patient has SNF recommendations. Left VM for [facility] DSS worker with contact info, reqeusting return call.   4:22 PM Left VM for [facility] DSS worker and for [facility] DSS supervisor, requested return call.                 Expected Discharge Plan and Services                                               Social Drivers of Health (SDOH) Interventions SDOH Screenings   Food Insecurity: No Food Insecurity (12/19/2023)  Housing: Patient Unable To Answer (12/20/2023)  Transportation Needs: Patient Unable To Answer (12/20/2023)  Utilities: Not At Risk (12/19/2023)  Tobacco Use: Medium Risk (12/16/2023)    Readmission Risk Interventions     No data to display

## 2023-12-26 NOTE — Consult Note (Signed)
 [facility] Consult Follow Up  Patient Name: .Sherry Bryan  MRN: [id number]  DOB: January 10, 1996  Consult Order details:  Orders (From admission, onward)     Start     Ordered   12/16/23 1406  IP CONSULT TO PSYCHIATRY       Ordering Provider: Willo Dunnings, MD  Provider:  (Not yet assigned)  Question:  Reason for consult:  Answer:  Medication management   12/16/23 1406   12/16/23 1406  CONSULT TO CALL ACT TEAM       Ordering Provider: Willo Dunnings, MD  Provider:  (Not yet assigned)  Question:  Reason for Consult?  Answer:  SI   12/16/23 1406             Mode of Visit: In person    Psychiatry Consult Evaluation  Service Date: December 26, 2023 LOS:  LOS: 9 days  Chief Complaint I ran off  Primary Psychiatric Diagnoses   Suicidal ideations   Assessment   Sherry Bryan is a 28 y.o. female admitted: Presented to the EDfor 12/16/2023  1:53 PM for suicidal ideation . She carries the psychiatric diagnoses of intellectual disability, borderline personality disorder, and PTSD  and has a past medical history of  blood clots on Eliquis , seizures.  On assessment today, patient only participated minimally stating she did not feel physically well.  Current exam was limited due to this.  Patient was just seen here yesterday for suicidal ideation.  Patient reported running off from her group home due to not feeling safe.  When asked to elaborate, patient stated well I miss my mom and dad and its close to the holidays and it always gets worse.  Patient denied that anyone was physically or emotionally harming her at the group home.  As stated in triage note, patient endorsed suicidal thoughts with plan but denied intent. This is a chronic presentation for patient.   Patient denied homicidal ideation.  Patient denied auditory or visual hallucinations as well.  Patient reported being compliant with current medication regimen. Patient is established with outpatient  providers.  This provider attempted to contact group home and legal guardian.  No answer at this time, HIPAA compliant voicemail left for legal guardian requesting return call.  Patient will be observed overnight to ensure safety and monitor for any unsafe behaviors and allow for psychiatry team to get into contact with both group home and legal guardian.  12/17/2023: Patient seen on rounds today by psychiatry.  Patient reported still feeling not good when referring to her physical health.  Patient still endorsed suicidal thoughts, but reported today that her plan was to take her medications.  Patient currently resides at a group home and medications are given by group home staff.  Patient does not have current access to her medications as they are given by staff.  Upon further questioning, patient reported she feels as though she always misses her parents more around the holidays and it is hard for her to handle.  Patient denied that she had ever spoken with a therapist about these concerns.  Patient did report that she would be willing to speak with a therapist about these things if she had one.  Patient reported she felt as though this would help.  Patient denied homicidal ideations.  Patient denied auditory or visual hallucinations as well.  Patient has continued to maintain safe behaviors while in the emergency department.  Per nursing staff and review of chart, patient has displayed no self-harm behaviors.  It does not appear that patient has required any IM agitation medications while holding in the emergency room. Although the patient endorses chronic suicidal ideation with an identified plan, they currently reside in a structured group home environment and have historically demonstrated the ability to maintain safety within that setting. The support and supervision available in the group home appear sufficient for ongoing community safety.  We will recommend that patient be established with therapy  services along with her medication management team that sees her in the group home.  At this time, psychiatry will sign off.  Patient is currently being medically admitted.  Please reach out for any new concerns or questions.  12/26/2023: Psychiatry was reconsulted by the hospital team due to concerns regarding polypharmacy and increased lethargy in the patient. On today's examination, the patient was alert and oriented x4 but was observed to fall asleep intermittently during the assessment, requiring redirection to continue the interview. When awake, she provided appropriate responses to questions. The patient denied experiencing excessive sleepiness or daytime drowsiness prior to her current medical illness and reported no recent medication adjustments, though she is noted to be a poor historian and does have a legal guardian. Review of her medication regimen reveals she is prescribed Lexapro  10 mg daily, Cogentin  1 mg twice daily, Seroquel  XR, trazodone , Uzedy  LAI and Cobenfy .  Reference medical team note for other medications that have been held for medical indications including clonidine , prazosin  and decreasing daily Ativan  dose.  The medical team has already initiated downward titration of her Seroquel  from baseline dose (50 mg nightly) to 12.5 mg nightly and trazodone  to 75 mg nightly (originally 300 mg nightly) given concerns for sedation. The patient currently denies suicidal and homicidal ideations as well as auditory or visual hallucinations. On current presentation there was no evidence of psychosis and patient did not appear to be responding to internal stimuli.When asked about previous movement disorders or indications for her current Cogentin  therapy, the patient was unable to provide reliable history. No extrapyramidal symptoms were noted on today's examination, though assessment is limited given her acute medical illness and current need for high-flow nasal cannula. This provider discussed with  the medical team that Cogentin  may be contributing to the patient's increased lethargy and recommended considering downward titration in 0.5 mg increments to assess for improvement in alertness. It was also noted that the patient's current acute medical status could be contributing to her lethargic presentation as well. The patient reports she is followed by [facility] for outpatient psychiatric care. Psychiatry will remain available for medication management consultation as needed during her medical admission. The patient does not require acute psychiatric inpatient admission at this time.While future psychiatric events cannot be accurately predicted, the patient does not currently require acute inpatient psychiatric care and does not currently meet [facility]  involuntary commitment criteria.         Diagnoses:  Active Hospital problems: Principal Problem:   Rhinovirus infection Active Problems:   Hx of seizure disorder   Hx of gastroesophageal reflux (GERD)   Intellectual disability   Suicidal ideations   Depression   Acute hypoxic respiratory failure (HCC)   Hypothyroidism   Essential hypertension   Morbid obesity (HCC)   Schizoaffective disorder (HCC)   PTSD (post-traumatic stress disorder)    Plan   ## Psychiatric Medication Recommendations:  See above under assessment portion of note  ## Medical Decision Making Capacity: Patient has a guardian and has thus been adjudicated incompetent; please involve patients guardian  in medical decision making  ## Further Work-up:   -- most recent EKG on 12/16/2023 had QtC of 444    ## Disposition:--Patient is medically admitted  ## Behavioral / Environmental: -Utilize compassion and acknowledge the patient's experiences while setting clear and realistic expectations for care.    ## Safety and Observation Level:  - Based on my clinical evaluation, I estimate the patient to be at low risk of self harm in the current setting.  Patient chronically at an elevated risk due to history. Unit can continue with protcol safety checks - At this time, we recommend  routine. This decision is based on my review of the chart including patient's history and current presentation, interview of the patient, mental status examination, and consideration of suicide risk including evaluating suicidal ideation, plan, intent, suicidal or self-harm behaviors, risk factors, and protective factors. This judgment is based on our ability to directly address suicide risk, implement suicide prevention strategies, and develop a safety plan while the patient is in the clinical setting. Please contact our team if there is a concern that risk level has changed.   Suicide Risk Assessment: Patient has following modifiable risk factors for suicide: N/A which we are addressing by utilizing therapeutic communication to give patient safe space to discuss current emotional status. Patient has following non-modifiable or demographic risk factors for suicide: psychiatric hospitalization Patient has the following protective factors against suicide: Access to outpatient mental health care  Thank you for this consult request. Recommendations have been communicated to the primary team.  We will be available as needed at this time.   Sherry Sharps, NP        History of Present Illness  Relevant Aspects of [facility]   Patient Report:  Collected on initial interview with patient on initial presentation to hospital: On assessment today, patient only participated minimally stating she did not feel physically well.  Current exam was limited due to this.  Patient was just seen here yesterday for suicidal ideation.  Patient reported running off from her group home due to not feeling safe.  When asked to elaborate, patient stated well I miss my mom and dad and its close to the holidays and it always gets worse.  Patient denied that anyone was physically or  emotionally harming her at the group home.  As stated in triage note, patient endorsed suicidal thoughts with plan but denied intent. This is a chronic presentation for patient.   Patient denied homicidal ideation.  Patient denied auditory or visual hallucinations as well.  Patient reported being compliant with current medication regimen. Patient is established with outpatient providers.  This provider attempted to contact group home and legal guardian.  No answer at this time, HIPAA compliant voicemail left for legal guardian requesting return call.  Patient will be observed overnight to ensure safety and monitor for any unsafe behaviors and allow for psychiatry team to get into contact with both group home and legal guardian.   See updated information under assessment portion of note  Psych ROS:  Depression: Denied Anxiety:  Denied Mania (lifetime and current): Denied Psychosis: (lifetime and current): Denied  Collateral information:  Attempted to contact legal guardian and group home-no answer at this time.      Psychiatric and Social History  Psychiatric History:  Information collected from Patient/chart review  Prev Dx/Sx: Bipolar disorder, Major depressive disorder, and Generalized anxiety disorder , IDD Current Psych Provider: [facility]  Home Meds (current): patient unsure Previous Med Trials: Donalee,  Haldol, Zyprexa , Abilify, Vraylar, Rexulti per patient report  Therapy: Unknown  Prior Psych Hospitalization: Yes  Prior Self Harm: Yes Prior Violence: Yes  Family Psych History: Patient reports family history of bipolar disorder, ADHD, ODD, and substance abuse in her mother  Family Hx suicide: unknown   Educational Hx: 11th grade Occupational Hx: Unemployed, receives disability Legal Hx: Unknown Living Situation: Group Home Access to weapons/lethal means: Denies   Substance History Alcohol: Denies Tobacco: Patient reports smoking 2 cigarettes/day and  vaping Illicit drugs: Denies Prescription drug abuse: Denies Rehab hx: Denies  Exam Findings  Physical Exam: Reviewed and agree with the physical exam findings conducted by the medical provider Vital Signs:  Temp:  [97.5 F (36.4 C)-99.1 F (37.3 C)] 97.7 F (36.5 C) (12/16 1203) Pulse Rate:  [75-79] 79 (12/16 1203) Resp:  [13-21] 15 (12/16 1203) BP: (93-131)/(59-76) 131/61 (12/16 1203) SpO2:  [89 %-94 %] 89 % (12/16 1203) FiO2 (%):  [70 %] 70 % (12/15 2115) Blood pressure 131/61, pulse 79, temperature 97.7 F (36.5 C), temperature source Axillary, resp. rate 15, height 5' 4 (1.626 m), weight (!) 141.4 kg, SpO2 (!) 89%. Body mass index is 53.51 kg/m.    Mental Status Exam: General Appearance: Fairly Groomed  Orientation:  Full (Time, Place, and Person)  Memory:  Fair  Concentration:  Concentration: Poor  Recall:  Poor  Attention  Poor  Eye Contact:  Minimal  Speech:  Slow  Language:  Fair  Volume:  Decreased  Mood: Okay  Affect:  Appropriate  Thought Process:  Coherent  Thought Content:  Logical  Suicidal Thoughts:  Denied  Homicidal Thoughts:  No  Judgement:  Poor  Insight:  Lacking  Psychomotor Activity:  Decreased  Akathisia:  No  Fund of Knowledge:  Fair      Assets:  Therapist, Sports  Cognition:  Impaired,  Moderate  ADL's: Impaired  AIMS (if indicated):        Other History   These have been pulled in through the EMR, reviewed, and updated if appropriate.  Family History:  The patient's family history is not on file.  Medical History: Past Medical History:  Diagnosis Date   Borderline personality disorder (HCC)    Constipation 05/15/2015   Depression    DMDD (disruptive mood dysregulation disorder) 05/15/2015   History of seasonal allergies 05/15/2015   Hx of gastroesophageal reflux (GERD) 05/15/2015   Hx of seizure disorder 05/15/2015   Intellectual disability 05/22/2015   PTSD (post-traumatic stress  disorder)    Seizures (HCC)    last one in 2015   Suicidal ideation     Surgical History: History reviewed. No pertinent surgical history.   Medications:   Current Facility-Administered Medications:    acetaminophen  (TYLENOL ) tablet 650 mg, 650 mg, Oral, Q6H PRN, 650 mg at 12/18/23 2141 **OR** acetaminophen  (TYLENOL ) suppository 650 mg, 650 mg, Rectal, Q6H PRN, Foust, Katy L, NP   albuterol  (PROVENTIL ) (2.5 MG/3ML) 0.083% nebulizer solution 2.5 mg, 2.5 mg, Nebulization, Q2H PRN, Foust, Katy L, NP, 2.5 mg at 12/24/23 2040   apixaban  (ELIQUIS ) tablet 5 mg, 5 mg, Oral, BID, Jessup, Charles, MD, 5 mg at 12/26/23 1013   benztropine  (COGENTIN ) tablet 0.5 mg, 0.5 mg, Oral, Daily, 0.5 mg at 12/26/23 1236 **AND** benztropine  (COGENTIN ) tablet 1 mg, 1 mg, Oral, QHS, Franchot Novel, MD   dextromethorphan -guaiFENesin  (MUCINEX  DM) 30-600 MG per 12 hr tablet 1 tablet, 1 tablet, Oral, BID, Franchot Novel, MD, 1 tablet at 12/26/23 1015  divalproex  (DEPAKOTE ) DR tablet 1,000 mg, 1,000 mg, Oral, BID, Jessup, Charles, MD, 1,000 mg at 12/26/23 1019   escitalopram  (LEXAPRO ) tablet 10 mg, 10 mg, Oral, Daily, Jessup, Charles, MD, 10 mg at 12/26/23 1237   ferrous sulfate  tablet 325 mg, 325 mg, Oral, Q breakfast, Franchot Novel, MD, 325 mg at 12/26/23 1013   folic acid  (FOLVITE ) tablet 1 mg, 1 mg, Oral, Daily, Jessup, Charles, MD, 1 mg at 12/26/23 1013   [START ON 12/27/2023] influenza vac split trivalent PF (FLUZONE ) injection 0.5 mL, 0.5 mL, Intramuscular, Tomorrow-1000, Franchot Novel, MD   lactulose  (CHRONULAC ) 10 GM/15ML solution 20 g, 20 g, Oral, Daily, Franchot Novel, MD, 20 g at 12/25/23 9148   levETIRAcetam  (KEPPRA ) tablet 500 mg, 500 mg, Oral, BID, Jessup, Charles, MD, 500 mg at 12/26/23 1013   levothyroxine  (SYNTHROID ) tablet 50 mcg, 50 mcg, Oral, Q0600, Dail Rankin RAMAN, RPH, 50 mcg at 12/26/23 0546   [START ON 12/27/2023] LORazepam  (ATIVAN ) tablet 0.5 mg, 0.5 mg, Oral, QHS, Franchot Novel, MD   metoprolol  succinate (TOPROL -XL) 24 hr tablet 25 mg, 25 mg, Oral, Daily, Franchot Novel, MD, 25 mg at 12/26/23 1014   ondansetron  (ZOFRAN ) tablet 4 mg, 4 mg, Oral, Q6H PRN, 4 mg at 12/22/23 1124 **OR** ondansetron  (ZOFRAN ) injection 4 mg, 4 mg, Intravenous, Q6H PRN, Foust, Katy L, NP, 4 mg at 12/20/23 2227   pantoprazole  (PROTONIX ) EC tablet 80 mg, 80 mg, Oral, Daily, Jessup, Charles, MD, 80 mg at 12/26/23 1013   QUEtiapine  (SEROQUEL ) tablet 12.5 mg, 12.5 mg, Oral, QHS, Franchot Novel, MD, 12.5 mg at 12/25/23 2213   senna-docusate (Senokot-S) tablet 1 tablet, 1 tablet, Oral, QHS, Franchot Novel, MD, 1 tablet at 12/25/23 2211   traZODone  (DESYREL ) tablet 75 mg, 75 mg, Oral, QHS, Franchot Novel, MD, 75 mg at 12/25/23 2214   Xanomeline-Trospium  Chloride 125-30 MG CAPS 1 capsule, 1 capsule, Oral, BID, Niels Kayla FALCON, [facility], 1 capsule at 12/26/23 1237  Allergies: Allergies  Allergen Reactions   Ritalin [Methylphenidate Hcl] Other (See Comments)    seizures   Abilify [Aripiprazole] Other (See Comments)    Shaking or tremors   Lithium      Sherry Sharps, NP This note was created using Scientist, clinical (histocompatibility and immunogenetics). Please excuse any inadvertent transcription errors. Case was discussed with supervising physician Dr. Jadapalle who is agreeable with current plan.

## 2023-12-26 NOTE — Progress Notes (Signed)
 Physical Therapy Treatment Patient Details Name: Sherry Bryan MRN: [id number] DOB: 1995/09/09 Today's Date: 12/26/2023   History of Present Illness Pt is a 28 year old female admitted with sepsis due to pneumonia, acute hypoxic respiratory failure; of note, pt presented to ED with suicidal ideations, Patient currently has no suicidal ideations    PMH significant for  hypertension, hypothyroidism, seizure disorder, GERD, prior blood clots on Eliquis , major depressive disorder, DMDD, intellectual disability, borderline personality disorder, PTSD, and adjustment disorder    PT Comments  Pt was lying in bed on arrival, on 50 L HFNC and reported pain at 7/10 in abdominal area. Today's session focused mainly on pt edu and benefits of physical activity while here at the hospital and beyond. Pt was able to perform some bed exercises, mobilize to EOB, STS from multiple surfaces and transfer to [facility]  without AD and with CGA (see details below). Pt reported some dizziness with transitional movement, however BP, HR and SpO2 remained WNL throughout. Pt had a couple of BM's during activity and was left on [facility] with nursing present. Pt will benefit from continued PT services upon discharge to safely address deficits listed in patient problem list for decreased caregiver assistance and eventual return to PLOF.    If plan is discharge home, recommend the following: Two people to help with walking and/or transfers;Two people to help with bathing/dressing/bathroom;Help with stairs or ramp for entrance;Assist for transportation;Direct supervision/assist for financial management;Direct supervision/assist for medications management;Assistance with cooking/housework   Can travel by private vehicle     Yes  Equipment Recommendations  Other (comment)    Recommendations for Other Services       Precautions / Restrictions Precautions Precautions: Fall Recall of Precautions/Restrictions:  Impaired Restrictions Weight Bearing Restrictions Per Provider Order: No     Mobility  Bed Mobility Overal bed mobility: Needs Assistance Bed Mobility: Supine to Sit, Sit to Supine     Supine to sit: Supervision     General bed mobility comments: pt was able to sit from supine to EOB with supervision and scoot herself to the EOB    Transfers Overall transfer level: Needs assistance Equipment used: None Transfers: Sit to/from Stand, Bed to chair/wheelchair/BSC Sit to Stand: Contact guard assist           General transfer comment: pt able to transfer from bed to [facility] with CGA for safety    Ambulation/Gait: some steps forward, back and laterally but pt declined further ambulation due to not feeling well                General Gait Details: unable to attempt   Stairs             Wheelchair Mobility     Tilt Bed    Modified Rankin (Stroke Patients Only)       Balance Overall balance assessment: Needs assistance Sitting-balance support: Feet supported Sitting balance-Leahy Scale: Good Sitting balance - Comments: sat EOB without assistance, performed long arc quads and marching Postural control: Posterior lean Standing balance support: During functional activity, No upper extremity supported Standing balance-Leahy Scale: Fair                              Musician Communication: Impaired Factors Affecting Communication: Difficulty expressing self  Cognition Arousal: Lethargic Behavior During Therapy: Flat affect   PT - Cognitive impairments: No family/caregiver present to determine baseline  Following commands: Impaired Following commands impaired: Follows one step commands with increased time    Cueing Cueing Techniques: Verbal cues, Tactile cues  Exercises General Exercises - Lower Extremity Ankle Circles/Pumps: AROM, 10 reps, Supine, Both Quad Sets: AROM, Both, Supine, 10  reps Long Arc Quad: AROM, 10 reps, Seated, Both Hip Flexion/Marching: AROM, Both, 10 reps, Seated Other Exercises Other Exercises: pt edu on benefits of physical activity to improve strength and overall wellbeing    General Comments General comments (skin integrity, edema, etc.): on 50L HFNC, pt very lethargic, but could take some steps forwards and backwards from bed to [facility], and STS from multiple surfaces, pt reported some dizziness but BP in sitting and standing, HR and O2 all WNL throughout      Pertinent Vitals/Pain Pain Assessment Pain Score: 7  Pain Location: abdominal    Home Living                          Prior Function            PT Goals (current goals can now be found in the care plan section) Acute Rehab PT Goals Patient Stated Goal: rehab then back to group home PT Goal Formulation: With patient Time For Goal Achievement: 01/04/24 Potential to Achieve Goals: Good Progress towards PT goals: Progressing toward goals    Frequency    Min 2X/week      PT Plan      Co-evaluation              AM-PAC PT 6 Clicks Mobility   Outcome Measure  Help needed turning from your back to your side while in a flat bed without using bedrails?: A Little Help needed moving from lying on your back to sitting on the side of a flat bed without using bedrails?: A Little Help needed moving to and from a bed to a chair (including a wheelchair)?: A Little Help needed standing up from a chair using your arms (e.g., wheelchair or bedside chair)?: A Little Help needed to walk in hospital room?: A Lot Help needed climbing 3-5 steps with a railing? : A Lot 6 Click Score: 16    End of Session Equipment Utilized During Treatment: Oxygen Activity Tolerance: Patient limited by lethargy;Other (comment);Treatment limited secondary to medical complications (Comment) Patient left: Other (comment) (on [facility] with nursing present) Nurse Communication: Mobility status PT  Visit Diagnosis: Unsteadiness on feet (R26.81);Other abnormalities of gait and mobility (R26.89);Difficulty in walking, not elsewhere classified (R26.2)     Time: 8360-8340 PT Time Calculation (min) (ACUTE ONLY): 20 min  Charges:                           Corean Newport, SPT 12/26/2023, 5:35 PM

## 2023-12-26 NOTE — Plan of Care (Signed)
 Patients vital signs remained stable. Patient weaned from heated high flow to regular high flow of 15L. Patient complaining of constipation. Suppository given with multiple Bms after administration. Patient worked with therapy. Problem: Education: Goal: Knowledge of General Education information will improve Description: Including pain rating scale, medication(s)/side effects and non-pharmacologic comfort measures Outcome: Progressing   Problem: Health Behavior/Discharge Planning: Goal: Ability to manage health-related needs will improve Outcome: Progressing   Problem: Clinical Measurements: Goal: Ability to maintain clinical measurements within normal limits will improve Outcome: Progressing Goal: Will remain free from infection Outcome: Progressing Goal: Diagnostic test results will improve Outcome: Progressing Goal: Respiratory complications will improve Outcome: Progressing Goal: Cardiovascular complication will be avoided Outcome: Progressing   Problem: Activity: Goal: Risk for activity intolerance will decrease Outcome: Progressing   Problem: Nutrition: Goal: Adequate nutrition will be maintained Outcome: Progressing   Problem: Coping: Goal: Level of anxiety will decrease Outcome: Progressing   Problem: Elimination: Goal: Will not experience complications related to bowel motility Outcome: Progressing Goal: Will not experience complications related to urinary retention Outcome: Progressing   Problem: Pain Managment: Goal: General experience of comfort will improve and/or be controlled Outcome: Progressing   Problem: Safety: Goal: Ability to remain free from injury will improve Outcome: Progressing   Problem: Skin Integrity: Goal: Risk for impaired skin integrity will decrease Outcome: Progressing   Problem: Activity: Goal: Ability to tolerate increased activity will improve Outcome: Progressing   Problem: Clinical Measurements: Goal: Ability to maintain  a body temperature in the normal range will improve Outcome: Progressing   Problem: Respiratory: Goal: Ability to maintain adequate ventilation will improve Outcome: Progressing Goal: Ability to maintain a clear airway will improve Outcome: Progressing

## 2023-12-26 NOTE — Progress Notes (Signed)
 OT Cancellation Note  Patient Details Name: Sherry Bryan MRN: [id number] DOB: March 14, 1995   Cancelled Treatment:    Reason Eval/Treat Not Completed: Fatigue/lethargy limiting ability to participate. Chart reviewed, discussed with RN. Pt has been up t/f BSC several times today. Pt received on HHFNC, asleep and arouses to voice. Pt declines OOB attempts, immediately falling back asleep. OT will check back as able for treatment when pt able to participate.   Keyion Knack L. Jame Morrell, OTR/L  12/26/2023, 3:53 PM

## 2023-12-26 NOTE — Progress Notes (Addendum)
 PROGRESS NOTE    Sherry Bryan  FMW:[id number] DOB: August 27, 1995 DOA: 12/16/2023 PCP: Pcp, No   Brief Narrative:   Sherry Bryan is a 28 y.o. year old female with past medical history of hypertension, hypothyroidism, seizure disorder, GERD, prior blood clots on Eliquis , major depressive disorder, DMDD, intellectual disability, borderline personality disorder, PTSD, and adjustment disorder.  She presented to [facility] ED yesterday with reports of suicidal ideation and productive cough.  Ultimately she was medically cleared and she remained in the ED voluntary status pending psychiatric evaluation.  This morning she reports that she has had a productive cough with green sputum, dyspnea, and chest pain associated with coughing for about 1 week.  She has not had any known sick contacts.     ED Course: This morning patient was noted to be afebrile temp 38.6 C, BP 98/71 and as low as 89/53 with MAP of 63, RR 21, SpO2 89% on room air and improving to 93% on 4 L via nasal cannula. CT head obtained and shows no acute intracranial abnormality with mucosal thickening and air-fluid levels in bilateral maxillary ethmoid sinuses and left sphenoid sinus.  CTA PE obtained and negative for PE, did show ground glass and streaky opacities in the lower lobes bilaterally. Labs notable for leukocytosis WBC 10.9 yesterday on ED arrival however improved to 7.7 this morning, lactic acid mildly elevated at 2.5, blood cultures obtained and in process.  BMP ordered not collected for this morning yesterday BMP grossly normal with mild hyperglycemia with glucose 125 and calcium 8.8.  COVID flu and RSV negative yesterday.  She was given Tylenol , Rocephin , doxycycline , and home meds including antihypertensives.  Patient was evaluated by telepsychiatry while in the ED who found patient to be at moderate risk of self-harm in the current environment and plan for reassessment this morning for final psychiatric disposition  recommendations.   Admitted by TRH.   Assessment & Plan:   Principal Problem:   Rhinovirus infection Active Problems:   Hx of seizure disorder   Hx of gastroesophageal reflux (GERD)   Intellectual disability   Suicidal ideations   Depression   Acute hypoxic respiratory failure (HCC)   Hypothyroidism   Essential hypertension   Morbid obesity (HCC)   Schizoaffective disorder (HCC)   PTSD (post-traumatic stress disorder)   *viral Pneumonia, rhinovirus (HCC) Sepsis r/o    Patient hypotensive with elevated lactic acid, hypoxia, CT chest showing bilateral lower lobe infiltrates concerning for pneumonia/atelectasis/aspiration -Viral panel positive for rhinovirus/enterovirus, procalcitonin less than 0.10 - Blood cultures no growth at 5 days, sputum cultures canceled - Status post cap treatment with ceftriaxone  and azithromycin .  Patient continues to have an oxygen requirement.  Will obtain ambulatory O2 sats in the AM.     Acute hypoxic respiratory failure (HCC) Chronic hypercapnic RF  Likely OHS/OSA Patient had a syncopal episode when oxygen was removed and O2 sats in the 80s.  ABG showed normal pH with elevated pCO2 and normal O2 saturation on nonrebreather.  Oxygenation improved 12/16 off of oxygen O2 sats >91%.   -Pulmonology was consulted given persistent hypoxia and hypercapnia concerning for OHS.  -Patient will require BiPAP on discharge and will need further titration of this in outpatient pulmonology clinic, -Patients oxygenation improved at bedside this AM. Will see if ambulatory O2 sats can be obtained in the AM.    Suicidal ideations Resolved  - Patient currently has no suicidal ideations.  Evaluated by psych.  Needs outpatient follow-up.  They have signed off.  Hx of seizure disorder - Continue home Keppra  and Depakote    Essential hypertension Continue home metoprolol . Continue to holdhome hydrochlorothiazide , clonidine , and prazosin  in setting of low normal  blood pressure    Hypothyroidism - Continue home Synthroid   Normocytic anemia  Iron  deficiency    Latest Ref Rng & Units 12/26/2023    8:22 AM 12/25/2023    4:25 AM 12/23/2023    3:29 AM  CBC  WBC 4.0 - 10.5 K/uL 7.1  7.2  7.7   Hemoglobin 12.0 - 15.0 g/dL 88.7  88.2  88.0   Hematocrit 36.0 - 46.0 % 37.2  39.7  40.7   Platelets 150 - 400 K/uL 190  175  172    Hgb stable.    Hx of gastroesophageal reflux (GERD) - Protonix    Schizoaffective disorder (HCC) - Continue home Cogentin  and Cobenfy       PTSD (post-traumatic stress disorder) - Hold home clonidine  and prazosin    Depression - Continue home Lexapro , Seroquel , trazodone  - Continue to reduce home Seroquel  and trazodone  dose given drowsiness -Psychiatry reconsulted will reduce cogentin     Intellectual disability - Patient has legal guardian for decision making   Morbid obesity (HCC) Body mass index is 52.18 kg/m.   Meets criteria for morbid obesity based on the presence of 1 or more chronic comorbidities. This complicates overall care and prognosis.       VTE prophylaxis:  Eliquis   GI prophylaxis: Protonix  Diet: Heart healthy Access: PIV Lines: None Code Status:  Full Code HCDM: Legal guardian Lorrin Soil Scientist: Yes             Admission status: Inpatient, Telemetry bed Patient is from: Group home Anticipated d/c is to: SNF Anticipated d/c date is: 1 to 2 days Patient currently: Requiring bipap at night, and intermittently requiring high flow [place].      Family Communication:   none present  Consults called: Psychiatry     Subjective:  Patient will occasionally remove bipap at night. Having bowel movements now.   Objective: Vitals:   12/26/23 0833 12/26/23 1203 12/26/23 1626 12/26/23 1955  BP:  131/61  131/89  Pulse:  79  96  Resp: 17 15  (!) 28  Temp:  97.7 F (36.5 C)  98.4 F (36.9 C)  TempSrc:  Axillary  Oral  SpO2:  (!) 89% 97% 92%  Weight:      Height:         Intake/Output Summary (Last 24 hours) at 12/26/2023 2000 Last data filed at 12/26/2023 1353 Gross per 24 hour  Intake 600 ml  Output --  Net 600 ml   Filed Weights   12/16/23 1322 12/24/23 0500  Weight: (!) 137.9 kg (!) 141.4 kg    Examination:  Constitutional: In no distress.  Cardiovascular: Normal rate, regular rhythm. No lower extremity edema  Pulmonary: Non labored breathing on room air, no wheezing or rales.   Abdominal: Soft. Obese. Non distended and non tender Musculoskeletal: Normal range of motion.     Neurological: Alert and oriented to person, place, and time. Drowsy  Skin: Skin is warm and dry.     Data Reviewed: I have personally reviewed following labs and imaging studies  CBC: Recent Labs  Lab 12/21/23 0538 12/22/23 1106 12/23/23 0329 12/25/23 0425 12/26/23 0822  WBC 7.0 8.2 7.7 7.2 7.1  HGB 11.2* 12.1 11.9* 11.7* 11.2*  HCT 36.6 39.0 40.7 39.7 37.2  MCV 80.6 80.2 82.6 83.2 82.1  PLT 131* 156 172 175 190  Basic Metabolic Panel: Recent Labs  Lab 12/21/23 0538 12/22/23 1106 12/23/23 0329 12/25/23 0425 12/26/23 0822  NA 139 138 138 139 137  K 4.3 4.6 4.5 4.5 4.4  CL 102 100 98 99 101  CO2 29 32 32 34* 31  GLUCOSE 81 117* 97 95 83  BUN 9 8 10 16 16   CREATININE 0.84 0.81 0.87 0.98 0.94  CALCIUM 9.1 9.1 8.9 9.2 9.2   GFR: Estimated Creatinine Clearance: 128 mL/min (by C-G formula based on SCr of 0.94 mg/dL). Liver Function Tests: Recent Labs  Lab 12/22/23 1106  AST 28  ALT 22  ALKPHOS 49  BILITOT <0.2  PROT 6.7  ALBUMIN 3.1*    No results for input(s): LIPASE, AMYLASE in the last 168 hours. No results for input(s): AMMONIA in the last 168 hours. Coagulation Profile: No results for input(s): INR, PROTIME in the last 168 hours. Cardiac Enzymes: No results for input(s): CKTOTAL, CKMB, CKMBINDEX, TROPONINI in the last 168 hours. BNP (last 3 results) Recent Labs    12/22/23 1221  PROBNP <50.0   HbA1C: No  results for input(s): HGBA1C in the last 72 hours. CBG: Recent Labs  Lab 12/21/23 1412  GLUCAP 123*   Lipid Profile: No results for input(s): CHOL, HDL, LDLCALC, TRIG, CHOLHDL, LDLDIRECT in the last 72 hours. Thyroid Function Tests: No results for input(s): TSH, T4TOTAL, FREET4, T3FREE, THYROIDAB in the last 72 hours. Anemia Panel: No results for input(s): VITAMINB12, FOLATE, FERRITIN, TIBC, IRON , RETICCTPCT in the last 72 hours.  Sepsis Labs: No results for input(s): PROCALCITON, LATICACIDVEN in the last 168 hours.   Recent Results (from the past 240 hours)  Culture, blood (routine x 2)     Status: None   Collection Time: 12/17/23 10:16 AM   Specimen: BLOOD  Result Value Ref Range Status   Specimen Description BLOOD BLOOD RIGHT HAND  Final   Special Requests   Final    BOTTLES DRAWN AEROBIC AND ANAEROBIC Blood Culture adequate volume   Culture   Final    NO GROWTH 5 DAYS Performed at [facility], [address] [place], [place] [address]    Report Status 12/22/2023 FINAL  Final  Culture, blood (routine x 2)     Status: None   Collection Time: 12/17/23 10:16 AM   Specimen: BLOOD  Result Value Ref Range Status   Specimen Description BLOOD BLOOD LEFT ARM  Final   Special Requests   Final    BOTTLES DRAWN AEROBIC AND ANAEROBIC Blood Culture adequate volume   Culture   Final    NO GROWTH 5 DAYS Performed at [facility], [address] [place], [place] [address]    Report Status 12/22/2023 FINAL  Final  Respiratory (~20 pathogens) panel by PCR     Status: Abnormal   Collection Time: 12/17/23 12:41 PM   Specimen: Nasopharyngeal Swab; Respiratory  Result Value Ref Range Status   Adenovirus NOT DETECTED NOT DETECTED Final   Coronavirus 229E NOT DETECTED NOT DETECTED Final    Comment: (NOTE) The Coronavirus on the Respiratory Panel, DOES NOT test for the novel  Coronavirus (2019 nCoV)    Coronavirus HKU1 NOT  DETECTED NOT DETECTED Final   Coronavirus NL63 NOT DETECTED NOT DETECTED Final   Coronavirus OC43 NOT DETECTED NOT DETECTED Final   Metapneumovirus NOT DETECTED NOT DETECTED Final   Rhinovirus / Enterovirus DETECTED (A) NOT DETECTED Final   Influenza A NOT DETECTED NOT DETECTED Final   Influenza B NOT DETECTED NOT  DETECTED Final   Parainfluenza Virus 1 NOT DETECTED NOT DETECTED Final   Parainfluenza Virus 2 NOT DETECTED NOT DETECTED Final   Parainfluenza Virus 3 NOT DETECTED NOT DETECTED Final   Parainfluenza Virus 4 NOT DETECTED NOT DETECTED Final   Respiratory Syncytial Virus NOT DETECTED NOT DETECTED Final   Bordetella pertussis NOT DETECTED NOT DETECTED Final   Bordetella Parapertussis NOT DETECTED NOT DETECTED Final   Chlamydophila pneumoniae NOT DETECTED NOT DETECTED Final   Mycoplasma pneumoniae NOT DETECTED NOT DETECTED Final    Comment: Performed at [facility], [address] [place], [place] [address]         Radiology Studies: No results found.    Scheduled Meds:  apixaban   5 mg Oral BID   benztropine   0.5 mg Oral Daily   And   benztropine   1 mg Oral QHS   dextromethorphan -guaiFENesin   1 tablet Oral BID   divalproex   1,000 mg Oral BID   escitalopram   10 mg Oral Daily   ferrous sulfate   325 mg Oral Q breakfast   folic acid   1 mg Oral Daily   [START ON 12/27/2023] influenza vac split trivalent PF  0.5 mL Intramuscular Tomorrow-1000   lactulose   20 g Oral Daily   levETIRAcetam   500 mg Oral BID   levothyroxine   50 mcg Oral Q0600   [START ON 12/27/2023] LORazepam   0.5 mg Oral QHS   metoprolol  succinate  25 mg Oral Daily   pantoprazole   80 mg Oral Daily   QUEtiapine   12.5 mg Oral QHS   senna-docusate  1 tablet Oral QHS   trazodone   75 mg Oral QHS   Xanomeline-Trospium  Chloride  1 capsule Oral BID   Continuous Infusions:    Alban Pepper, MD [facility] 12/26/2023, 8:00 PM

## 2023-12-27 ENCOUNTER — Inpatient Hospital Stay: Payer: MEDICAID

## 2023-12-27 DIAGNOSIS — B348 Other viral infections of unspecified site: Secondary | ICD-10-CM | POA: Diagnosis not present

## 2023-12-27 DIAGNOSIS — F341 Dysthymic disorder: Secondary | ICD-10-CM

## 2023-12-27 LAB — BLOOD GAS, ARTERIAL
Acid-Base Excess: 7.8 mmol/L — ABNORMAL HIGH (ref 0.0–2.0)
Bicarbonate: 34.2 mmol/L — ABNORMAL HIGH (ref 20.0–28.0)
O2 Content: 6 L/min
O2 Saturation: 97.5 %
Patient temperature: 37
pCO2 arterial: 54 mmHg — ABNORMAL HIGH (ref 32–48)
pH, Arterial: 7.41 (ref 7.35–7.45)
pO2, Arterial: 85 mmHg (ref 83–108)

## 2023-12-27 LAB — BASIC METABOLIC PANEL WITH GFR
Anion gap: 6 (ref 5–15)
BUN: 14 mg/dL (ref 6–20)
CO2: 31 mmol/L (ref 22–32)
Calcium: 9.5 mg/dL (ref 8.9–10.3)
Chloride: 100 mmol/L (ref 98–111)
Creatinine, Ser: 1 mg/dL (ref 0.44–1.00)
GFR, Estimated: >60 mL/min (ref >60)
Glucose, Bld: 96 mg/dL (ref 70–99)
Potassium: 4.6 mmol/L (ref 3.5–5.1)
Sodium: 136 mmol/L (ref 135–145)

## 2023-12-27 LAB — CBC
HCT: 40.8 % (ref 36.0–46.0)
Hemoglobin: 12.6 g/dL (ref 12.0–15.0)
MCH: 25 pg — ABNORMAL LOW (ref 26.0–34.0)
MCHC: 30.9 g/dL (ref 30.0–36.0)
MCV: 81.1 fL (ref 80.0–100.0)
Platelets: 201 K/uL (ref 150–400)
RBC: 5.03 MIL/uL (ref 3.87–5.11)
RDW: 22.7 % — ABNORMAL HIGH (ref 11.5–15.5)
WBC: 8.5 K/uL (ref 4.0–10.5)
nRBC: 0 % (ref 0.0–0.2)

## 2023-12-27 LAB — BLOOD GAS, VENOUS
Acid-Base Excess: 5.6 mmol/L — ABNORMAL HIGH (ref 0.0–2.0)
Bicarbonate: 33.1 mmol/L — ABNORMAL HIGH (ref 20.0–28.0)
O2 Saturation: 46.7 %
Patient temperature: 37
pCO2, Ven: 60 mmHg (ref 44–60)
pH, Ven: 7.35 (ref 7.25–7.43)
pO2, Ven: 34 mmHg (ref 32–45)

## 2023-12-27 NOTE — TOC Progression Note (Signed)
 Transition of Care [facility]) - Progression Note    Patient Details  Name: Sherry Bryan MRN: [id number] Date of Birth: Oct 06, 1995  [facility]) CM/SW Contact  Shasta DELENA Daring, RN Phone Number: 12/27/2023, 10:30 AM  Clinical Narrative:    RNCM spoke with [facility] DSS [place]. Updated her on patient condition. Advised that the team is recommending patient go to SNF for rehab before returning to the group home. Ms. Carlin said they would follow medical recommendations and that we could initate a search for rehab bed. Advised that I would give her number to the MD following the patient and request that he give her a call.    Updated patient contact info with number for on-call DSS worker to use during after hours and emergencies.                     Expected Discharge Plan and Services                                               Social Drivers of Health (SDOH) Interventions SDOH Screenings   Food Insecurity: No Food Insecurity (12/19/2023)  Housing: Patient Unable To Answer (12/20/2023)  Transportation Needs: Patient Unable To Answer (12/20/2023)  Utilities: Not At Risk (12/19/2023)  Tobacco Use: Medium Risk (12/16/2023)    Readmission Risk Interventions     No data to display

## 2023-12-27 NOTE — TOC Progression Note (Signed)
 Transition of Care [facility]) - Progression Note    Patient Details  Name: Sherry Bryan MRN: [id number] Date of Birth: 07/13/1995  [facility] of [facility]) CM/SW Contact  Shasta DELENA Daring, RN Phone Number: 12/27/2023, 2:48 PM  Clinical Narrative:     PASSR submitted. Under reviewed.                    Expected Discharge Plan and Services                                               Social Drivers of Health (SDOH) Interventions SDOH Screenings   Food Insecurity: No Food Insecurity (12/19/2023)  Housing: Patient Unable To Answer (12/20/2023)  Transportation Needs: Patient Unable To Answer (12/20/2023)  Utilities: Not At Risk (12/19/2023)  Tobacco Use: Medium Risk (12/16/2023)    Readmission Risk Interventions     No data to display

## 2023-12-27 NOTE — Progress Notes (Signed)
 PULMONOLOGY         Date: 12/27/2023,   MRN# [id number] Sherry Bryan 1996/01/08     AdmissionWeight: (!) 137.9 kg                 CurrentWeight: (!) 141.4 kg  Referring provider: Dr. Franchot   CHIEF COMPLAINT:   Hypercapnic hypoxemic respiratory failure   HISTORY OF PRESENT ILLNESS   This is a 28 year old female with a history of borderline personality disorder, chronic constipation, major depressive disorder, disruptive mood dysregulation disorder, GERD, history of seizure disorder, intellectual disability, pulmonary embolism, morbid obesity posttraumatic stress disorder, suicidal ideation, who was brought in with respiratory distress and a productive cough significant for expectoration of dark phlegm.  On arrival to the emergency department she was hypoxemic requiring 4 L via nasal cannula to reach 89% and was hypotensive with blood pressure less than 100 systolic.  She did have a CT chest obtained on arrival to the ER with ground glass opacification bilaterally but negative for PE.  She is found to be rhinovirus positive.  CT chest with bilateral infiltrates, ABG with hypercapnia and hypoxemia.  She is being evaluated by psychiatry due to self-harm and history of suicidal ideation with multiple psychiatric diagnoses.  PCCM consultation for progressive hypercapnic respiratory failure with hypoxemia potentially needing home BiPAP.  12/25/23- patient is on HFNC resting in bed in no acute distress. She may be seen in pulmonary clinic post dc home.  PCCM will sign off today.  BIPAP has been ordered for home use. She currently is being treated for rhinovirus associated respiratory failure with hypercapnia and hypoxemia.   12/27/23- patient seen at bedside with now weaning of O2 to 8L/min bubbler HF.  She is in no distress. I'm repeating ABG today.   PAST MEDICAL HISTORY   Past Medical History:  Diagnosis Date   Borderline personality disorder (HCC)    Constipation  05/15/2015   Depression    DMDD (disruptive mood dysregulation disorder) 05/15/2015   History of seasonal allergies 05/15/2015   Hx of gastroesophageal reflux (GERD) 05/15/2015   Hx of seizure disorder 05/15/2015   Intellectual disability 05/22/2015   PTSD (post-traumatic stress disorder)    Seizures (HCC)    last one in 2015   Suicidal ideation      SURGICAL HISTORY   History reviewed. No pertinent surgical history.   FAMILY HISTORY   History reviewed. No pertinent family history.   SOCIAL HISTORY   Social History[1]   MEDICATIONS     Current Medication: Current Medications[2]    ALLERGIES   Ritalin [methylphenidate hcl], Abilify [aripiprazole], and Lithium      REVIEW OF SYSTEMS    Review of Systems:  Gen:  Denies  fever, sweats, chills weigh loss  HEENT: Denies blurred vision, double vision, ear pain, eye pain, hearing loss, nose bleeds, sore throat Cardiac:  No dizziness, chest pain or heaviness, chest tightness,edema Resp:   reports dyspnea chronically  Gi: Denies swallowing difficulty, stomach pain, nausea or vomiting, diarrhea, constipation, bowel incontinence Gu:  Denies bladder incontinence, burning urine Ext:   Denies Joint pain, stiffness or swelling Skin: Denies  skin rash, easy bruising or bleeding or hives Endoc:  Denies polyuria, polydipsia , polyphagia or weight change Psych:   Denies depression, insomnia or hallucinations   Other:  All other systems negative   VS: BP (!) 111/98 (BP Location: Left Arm)   Pulse 86   Temp 98 F (36.7 C) (Axillary)   Resp ROLLEN)  26   Ht 5' 4 (1.626 m)   Wt (!) 141.4 kg   SpO2 96%   BMI 53.51 kg/m      PHYSICAL EXAM    GENERAL:NAD, no fevers, chills, no weakness no fatigue HEAD: Normocephalic, atraumatic.  EYES: Pupils equal, round, reactive to light. Extraocular muscles intact. No scleral icterus.  MOUTH: Moist mucosal membrane. Dentition intact. No abscess noted.  EAR, NOSE, THROAT: Clear without  exudates. No external lesions.  NECK: Supple. No thyromegaly. No nodules. No JVD.  PULMONARY: decreased breath sounds with mild rhonchi worse at bases bilaterally.  CARDIOVASCULAR: S1 and S2. Regular rate and rhythm. No murmurs, rubs, or gallops. No edema. Pedal pulses 2+ bilaterally.  GASTROINTESTINAL: Soft, nontender, nondistended. No masses. Positive bowel sounds. No hepatosplenomegaly.  MUSCULOSKELETAL: No swelling, clubbing, or edema. Range of motion full in all extremities.  NEUROLOGIC: Cranial nerves II through XII are intact. No gross focal neurological deficits. Sensation intact. Reflexes intact.  SKIN: No ulceration, lesions, rashes, or cyanosis. Skin warm and dry. Turgor intact.  PSYCHIATRIC: Mood, affect within normal limits. The patient is awake, alert and oriented x 3. Insight, judgment intact.       IMAGING    CT Head Wo Contrast Final result 12/17/2023 10:52 AM        EXAM: CT HEAD WITHOUT CONTRAST 12/17/2023 10:52:44 AM  TECHNIQUE: CT of the head was performed without the administration of intravenous contrast. Automated exposure control, iterative reconstruction, and/or weight based adjustment of the mA/kV was utilized to reduce the radiation dose to as low as reasonably achievable.  ...        Study Result  Narrative & Impression  EXAM: CTA of the Chest without and with contrast for PE 12/17/2023 10:52:44 AM   TECHNIQUE: CTA of the chest was performed without and with the administration of 75 mL of iohexol  (OMNIPAQUE ) 350 MG/ML intravenous contrast. Multiplanar reformatted images are provided for review. MIP images are provided for review. Automated exposure control, iterative reconstruction, and/or weight based adjustment of the mA/kV was utilized to reduce the radiation dose to as low as reasonably achievable.   COMPARISON: None available.   CLINICAL HISTORY: Pulmonary embolism (PE) suspected, high prob.   FINDINGS:   PULMONARY  ARTERIES: Pulmonary arteries are adequately opacified for evaluation. No pulmonary embolism. Main pulmonary artery is normal in caliber.   MEDIASTINUM: The heart and pericardium demonstrate no acute abnormality. There is no acute abnormality of the thoracic aorta.   LYMPH NODES: No mediastinal, hilar or axillary lymphadenopathy.   LUNGS AND PLEURA: There are ground glass and streaky opacities present within the lower lobes bilaterally. The nondependent lungs are relatively clear. No pleural effusion or pneumothorax.   UPPER ABDOMEN: Limited images of the upper abdomen are unremarkable.   SOFT TISSUES AND BONES: No acute bone or soft tissue abnormality.   IMPRESSION: 1. No pulmonary embolism. 2. Ground-glass and streaky opacities in the lower lobes bilaterally, which may reflect atelectasis, aspiration, or atypical/viral pneumonia; correlate clinically.   Electronically signed by: Evalene Coho MD 12/17/2023 11:00 AM EST RP Workstation: [id number]      ASSESSMENT/PLAN   Hypercapnic hypoxemic respiratory failure    Patient qualifies for BIPAP and likely needs it long term due to morbid obesity and OSA overlap  -have placed BIPAP for home use - rhinovirus is most likely cause of ground glass opacities on CT chest but this would not explain the hypercapnic failure - I can follow up with her on outpatient to check settings  and make adjustments -PCO2 >55 on ABG Repeating ABG today and CXR today  -O2 weaned to 8L/min Rudolph       Thank you for allowing me to participate in the care of this patient.   Patient/Family are satisfied with care plan and all questions have been answered.    Provider disclosure: Patient with at least one acute or chronic illness or injury that poses a threat to life or bodily function and is being managed actively during this encounter.  All of the below services have been performed independently by signing provider:  review of prior  documentation from internal and or external health records.  Review of previous and current lab results.  Interview and comprehensive assessment during patient visit today. Review of current and previous chest radiographs/CT scans. Discussion of management and test interpretation with health care team and patient/family.   This document was prepared using Dragon voice recognition software and may include unintentional dictation errors.     Asees Manfredi, M.D.  Division of Pulmonary & Critical Care Medicine               [1]  Social History Tobacco Use   Smoking status: Former    Types: Cigarettes   Smokeless tobacco: Never  Vaping Use   Vaping status: Every Day  Substance Use Topics   Alcohol use: No   Drug use: No  [2]  Current Facility-Administered Medications:    acetaminophen  (TYLENOL ) tablet 650 mg, 650 mg, Oral, Q6H PRN, 650 mg at 12/18/23 2141 **OR** acetaminophen  (TYLENOL ) suppository 650 mg, 650 mg, Rectal, Q6H PRN, Foust, Katy L, NP   albuterol  (PROVENTIL ) (2.5 MG/3ML) 0.083% nebulizer solution 2.5 mg, 2.5 mg, Nebulization, Q2H PRN, Foust, Katy L, NP, 2.5 mg at 12/24/23 2040   apixaban  (ELIQUIS ) tablet 5 mg, 5 mg, Oral, BID, Jessup, Charles, MD, 5 mg at 12/27/23 9061   benztropine  (COGENTIN ) tablet 0.5 mg, 0.5 mg, Oral, Daily, 0.5 mg at 12/27/23 0939 **AND** benztropine  (COGENTIN ) tablet 1 mg, 1 mg, Oral, QHS, Franchot Novel, MD, 1 mg at 12/26/23 2124   dextromethorphan -guaiFENesin  (MUCINEX  DM) 30-600 MG per 12 hr tablet 1 tablet, 1 tablet, Oral, BID, Franchot Novel, MD, 1 tablet at 12/27/23 9060   divalproex  (DEPAKOTE ) DR tablet 1,000 mg, 1,000 mg, Oral, BID, Jessup, Charles, MD, 1,000 mg at 12/27/23 0940   escitalopram  (LEXAPRO ) tablet 10 mg, 10 mg, Oral, Daily, Jessup, Charles, MD, 10 mg at 12/26/23 1237   ferrous sulfate  tablet 325 mg, 325 mg, Oral, Q breakfast, Franchot Novel, MD, 325 mg at 12/27/23 9061   folic acid  (FOLVITE ) tablet 1 mg, 1 mg, Oral,  Daily, Willo Dunnings, MD, 1 mg at 12/27/23 9061   lactulose  (CHRONULAC ) 10 GM/15ML solution 20 g, 20 g, Oral, Daily, Franchot Novel, MD, 20 g at 12/25/23 0851   levETIRAcetam  (KEPPRA ) tablet 500 mg, 500 mg, Oral, BID, Jessup, Charles, MD, 500 mg at 12/27/23 9061   levothyroxine  (SYNTHROID ) tablet 50 mcg, 50 mcg, Oral, Q0600, Dail Rankin RAMAN, RPH, 50 mcg at 12/27/23 9480   LORazepam  (ATIVAN ) tablet 0.5 mg, 0.5 mg, Oral, QHS, Franchot Novel, MD   metoprolol  succinate (TOPROL -XL) 24 hr tablet 25 mg, 25 mg, Oral, Daily, Franchot Novel, MD, 25 mg at 12/26/23 1014   ondansetron  (ZOFRAN ) tablet 4 mg, 4 mg, Oral, Q6H PRN, 4 mg at 12/22/23 1124 **OR** ondansetron  (ZOFRAN ) injection 4 mg, 4 mg, Intravenous, Q6H PRN, Foust, Katy L, NP, 4 mg at 12/20/23 2227   pantoprazole  (PROTONIX ) EC tablet 80  mg, 80 mg, Oral, Daily, Jessup, Charles, MD, 80 mg at 12/27/23 9061   QUEtiapine  (SEROQUEL ) tablet 12.5 mg, 12.5 mg, Oral, QHS, Franchot Novel, MD, 12.5 mg at 12/26/23 2123   senna-docusate (Senokot-S) tablet 1 tablet, 1 tablet, Oral, QHS, Franchot Novel, MD, 1 tablet at 12/26/23 2123   traZODone  (DESYREL ) tablet 75 mg, 75 mg, Oral, QHS, Franchot Novel, MD, 75 mg at 12/26/23 2123   Xanomeline-Trospium  Chloride 125-30 MG CAPS 1 capsule, 1 capsule, Oral, BID, Niels Kayla FALCON, [facility], 1 capsule at 12/27/23 [phone]

## 2023-12-27 NOTE — Progress Notes (Signed)
 PROGRESS NOTE    Sherry Bryan  FMW:[id number] DOB: 1995/12/28 DOA: 12/16/2023 PCP: Pcp, No  Chief Complaint  Patient presents with   Suicidal    Hospital Course:  Sherry Bryan is a 28 y.o. year old female with past medical history of hypertension, hypothyroidism, seizure disorder, GERD, prior blood clots on Eliquis , major depressive disorder, DMDD, intellectual disability, borderline personality disorder, PTSD, and adjustment disorder.  She presented to [facility] ED yesterday with reports of suicidal ideation and productive cough.  Ultimately she was medically cleared and she remained in the ED voluntary status pending psychiatric evaluation.  This morning she reports that she has had a productive cough with green sputum, dyspnea, and chest pain associated with coughing for about 1 week.  She has not had any known sick contacts.     ED Course: This morning patient was noted to be afebrile temp 38.6 C, BP 98/71 and as low as 89/53 with MAP of 63, RR 21, SpO2 89% on room air and improving to 93% on 4 L via nasal cannula. CT head obtained and shows no acute intracranial abnormality with mucosal thickening and air-fluid levels in bilateral maxillary ethmoid sinuses and left sphenoid sinus.  CTA PE obtained and negative for PE, did show ground glass and streaky opacities in the lower lobes bilaterally. Labs notable for leukocytosis WBC 10.9 yesterday on ED arrival however improved to 7.7 this morning, lactic acid mildly elevated at 2.5, blood cultures obtained and in process.  BMP ordered not collected for this morning yesterday BMP grossly normal with mild hyperglycemia with glucose 125 and calcium 8.8.  COVID flu and RSV negative yesterday.  She was given Tylenol , Rocephin , doxycycline , and home meds including antihypertensives.  Patient was evaluated by telepsychiatry while in the ED who found patient to be at moderate risk of self-harm in the current environment and plan for  reassessment this morning for final psychiatric disposition recommendations.  [facility] contacted for admission.   Subjective: Patient denies any acute events overnight.  No acute complaints this morning.  She is sitting upright in bedside commode, trying to use the bathroom at the time of my evaluation   Objective: Vitals:   12/27/23 0505 12/27/23 0600 12/27/23 0800 12/27/23 1200  BP: 107/71  99/64 (!) 111/98  Pulse: 95 74 80 86  Resp: (!) 29 18 16  (!) 26  Temp: 97.9 F (36.6 C)  97.7 F (36.5 C) 98 F (36.7 C)  TempSrc: Oral  Oral Axillary  SpO2: 96% 99% 95% 96%  Weight:      Height:       No intake or output data in the 24 hours ending 12/27/23 1358 Filed Weights   12/16/23 1322 12/24/23 0500  Weight: (!) 137.9 kg (!) 141.4 kg    Examination: General exam: Appears calm and comfortable, NAD  Respiratory system: No work of breathing, symmetric chest wall expansion Cardiovascular system: S1 & S2 heard, RRR.  Gastrointestinal system: Abdomen is nondistended, soft and nontender.  Neuro: Alert and oriented. No focal neurological deficits. Extremities: Symmetric, expected ROM Skin: No rashes, lesions Psychiatry: Demonstrates appropriate judgement and insight. Mood & affect appropriate for situation.   Assessment & Plan:  Principal Problem:   Rhinovirus infection Active Problems:   Hx of seizure disorder   Hx of gastroesophageal reflux (GERD)   Intellectual disability   Suicidal ideations   Depression   Acute hypoxic respiratory failure (HCC)   Hypothyroidism   Essential hypertension   Morbid obesity (HCC)   Schizoaffective  disorder (HCC)   PTSD (post-traumatic stress disorder)    Rhinovirus Superimposed bacterial pneumonia Sepsis ruled out - Patient was admitted hypotensive with lactic acidosis, hypoxia, CT chest with bilateral lower lobe infiltrates concerning for pneumonia, atelectasis, aspiration - RVP positive for rhinovirus/enterovirus.  Procalcitonin less than  0.1 - Blood cultures negative - Sputum cultures were discontinued - Received 5 days ceftriaxone  azithromycin  - Continues to have oxygen requirement - Pursue walk trial today  Acute on chronic hypoxic respiratory failure with hypercapnia - Reportedly had syncopal episode when O2 was removed and sats were in the 80s.  ABG revealed compensated pH - Pulmonology was consulted given persistent hypoxia and hypercapnia concerning for OHS - She will require BiPAP at discharge and close outpatient pulmonology follow-up - Walk trial has been ordered to determine home O2 needs so she will likely discharge directly to a skilled nursing facility  Suicidal ideations, resolved - Has been evaluated by psych and they have recommended for outpatient follow-up and signed off  History of seizure disorder - Continue home meds  Hypertension - Continue metoprolol  - At home is also taking HCTZ, clonidine , prazosin .  BP currently stable without.  Will resume gradually as needed  Hypothyroidism - Continue Synthroid   Normocytic anemia Iron  deficiency anemia - Hemoglobin remained stable  History of GERD - Continue Protonix   Schizoaffective disorder - Psych has reduced Cogentin   Intellectual disability - Patient has a legal guardian for all decision-making.  TOC aware  PTSD - On clonidine  and prazosin  at home.  Doing well without for now.  Gradually resume as tolerated  Depression - Continue Lexapro , Seroquel , trazodone  - Currently on lower doses due to drowsiness.  Titrate as needed  Body mass index is 53.51 kg/m. Obesity Class III - Outpatient follow up for lifestyle modification and risk factor management   DVT prophylaxis: Eliquis    Code Status: Full Code Disposition:  Pending SNF  Consultants:  Treatment Team:  Consulting Physician: Parris Manna, MD Consulting Physician: Leesa Kast, DO  Procedures:    Antimicrobials:  Anti-infectives (From admission, onward)     Start     Dose/Rate Route Frequency Ordered Stop   12/20/23 1000  azithromycin  (ZITHROMAX ) tablet 500 mg  Status:  Discontinued        500 mg Oral Daily 12/19/23 0944 12/20/23 0947   12/18/23 1000  cefTRIAXone  (ROCEPHIN ) 2 g in sodium chloride  0.9 % 100 mL IVPB        2 g 200 mL/hr over 30 Minutes Intravenous Every 24 hours 12/17/23 1157 12/22/23 1001   12/17/23 1200  azithromycin  (ZITHROMAX ) 500 mg in sodium chloride  0.9 % 250 mL IVPB        500 mg 250 mL/hr over 60 Minutes Intravenous Every 24 hours 12/17/23 1157 12/19/23 1356   12/17/23 1015  cefTRIAXone  (ROCEPHIN ) 1 g in sodium chloride  0.9 % 100 mL IVPB        1 g 200 mL/hr over 30 Minutes Intravenous  Once 12/17/23 1012 12/17/23 1118   12/17/23 1015  doxycycline  (VIBRAMYCIN ) 100 mg in sodium chloride  0.9 % 250 mL IVPB        100 mg 125 mL/hr over 120 Minutes Intravenous  Once 12/17/23 1012 12/17/23 1341       Data Reviewed: I have personally reviewed following labs and imaging studies CBC: Recent Labs  Lab 12/22/23 1106 12/23/23 0329 12/25/23 0425 12/26/23 0822 12/27/23 0503  WBC 8.2 7.7 7.2 7.1 8.5  HGB 12.1 11.9* 11.7* 11.2* 12.6  HCT 39.0 40.7 39.7  37.2 40.8  MCV 80.2 82.6 83.2 82.1 81.1  PLT 156 172 175 190 201   Basic Metabolic Panel: Recent Labs  Lab 12/22/23 1106 12/23/23 0329 12/25/23 0425 12/26/23 0822 12/27/23 0503  NA 138 138 139 137 136  K 4.6 4.5 4.5 4.4 4.6  CL 100 98 99 101 100  CO2 32 32 34* 31 31  GLUCOSE 117* 97 95 83 96  BUN 8 10 16 16 14   CREATININE 0.81 0.87 0.98 0.94 1.00  CALCIUM 9.1 8.9 9.2 9.2 9.5   GFR: Estimated Creatinine Clearance: 120.3 mL/min (by C-G formula based on SCr of 1 mg/dL). Liver Function Tests: Recent Labs  Lab 12/22/23 1106  AST 28  ALT 22  ALKPHOS 49  BILITOT <0.2  PROT 6.7  ALBUMIN 3.1*   CBG: Recent Labs  Lab 12/21/23 1412  GLUCAP 123*    No results found for this or any previous visit (from the past 240 hours).   Radiology Studies: No  results found.  Scheduled Meds:  apixaban   5 mg Oral BID   benztropine   0.5 mg Oral Daily   And   benztropine   1 mg Oral QHS   dextromethorphan -guaiFENesin   1 tablet Oral BID   divalproex   1,000 mg Oral BID   escitalopram   10 mg Oral Daily   ferrous sulfate   325 mg Oral Q breakfast   folic acid   1 mg Oral Daily   lactulose   20 g Oral Daily   levETIRAcetam   500 mg Oral BID   levothyroxine   50 mcg Oral Q0600   LORazepam   0.5 mg Oral QHS   metoprolol  succinate  25 mg Oral Daily   pantoprazole   80 mg Oral Daily   QUEtiapine   12.5 mg Oral QHS   senna-docusate  1 tablet Oral QHS   trazodone   75 mg Oral QHS   Xanomeline-Trospium  Chloride  1 capsule Oral BID   Continuous Infusions:   LOS: 10 days  MDM: Patient is high risk for one or more organ failure.  They necessitate ongoing hospitalization for continued IV therapies and subsequent lab monitoring. Total time spent interpreting labs and vitals, reviewing the medical record, coordinating care amongst consultants and care team members, directly assessing and discussing care with the patient and/or family: 55 min  Sherry Radu, DO [facility]  To contact the attending physician between 7A-7P please use Epic Chat. To contact the covering physician during after hours 7P-7A, please review Amion.  12/27/2023, 1:58 PM   *This document has been created with the assistance of dictation software. Please excuse typographical errors. *

## 2023-12-27 NOTE — Progress Notes (Signed)
 Occupational Therapy Treatment Patient Details Name: Sherry Bryan MRN: [id number] DOB: [birth year] Today's Date: 12/27/2023   History of present illness Pt is a 28 year old female admitted with sepsis due to pneumonia, acute hypoxic respiratory failure; of note, pt presented to ED with suicidal ideations, Patient currently has no suicidal ideations    PMH significant for  hypertension, hypothyroidism, seizure disorder, GERD, prior blood clots on Eliquis , major depressive disorder, DMDD, intellectual disability, borderline personality disorder, PTSD, and adjustment disorder   OT comments  Pt received in bed, requesting to use BSC. Pt lethargic with flat affect, follows commands with increased time. Supervision for bed mobility, CGA for transfers bed <> BSC, for continent void. Able to complete pericare in standing with supervision and unilateral UE support standing. VSS on 9L HFNC. OT will follow acutely, discharge recommendation appropriate.       If plan is discharge home, recommend the following:  Supervision due to cognitive status;Direct supervision/assist for financial management;Direct supervision/assist for medications management;Assistance with cooking/housework;Help with stairs or ramp for entrance;A lot of help with walking and/or transfers;A lot of help with bathing/dressing/bathroom   Equipment Recommendations  BSC/3in1;Tub/shower seat (bariatric)       Precautions / Restrictions Precautions Precautions: Fall Recall of Precautions/Restrictions: Impaired Precaution/Restrictions Comments: monitor O2 Restrictions Weight Bearing Restrictions Per Provider Order: No       Mobility Bed Mobility Overal bed mobility: Needs Assistance Bed Mobility: Supine to Sit, Sit to Supine     Supine to sit: Supervision Sit to supine: Min assist        Transfers Overall transfer level: Needs assistance Equipment used: None Transfers: Sit to/from Stand, Bed to  chair/wheelchair/BSC Sit to Stand: Contact guard assist     Step pivot transfers: Contact guard assist     General transfer comment: able to complete 3x STS with HHA, bed <> BSC with CGA     Balance Overall balance assessment: Needs assistance Sitting-balance support: Feet supported Sitting balance-Leahy Scale: Good     Standing balance support: During functional activity, No upper extremity supported Standing balance-Leahy Scale: Fair                             ADL either performed or assessed with clinical judgement   ADL Overall ADL's : Needs assistance/impaired     Grooming: Sitting;Supervision/safety;Wash/dry hands           Upper Body Dressing : Minimal assistance;Sitting Upper Body Dressing Details (indicate cue type and reason): to don and Agricultural Engineer Transfer: Contact guard assist;BSC/3in1 Statistician Details (indicate cue type and reason): CGA bed <> BSC Toileting- Clothing Manipulation and Hygiene: Supervision/safety;Sit to/from stand Toileting - Clothing Manipulation Details (indicate cue type and reason): able to perform hygiene in standing after continent void     Functional mobility during ADLs: Contact guard assist      Extremity/Trunk Assessment              Vision       Perception     Praxis     Communication Communication Communication: Impaired Factors Affecting Communication: Difficulty expressing self   Cognition Arousal: Lethargic Behavior During Therapy: Flat affect Cognition: History of cognitive impairments, Cognition impaired, No family/caregiver present to determine baseline   Orientation impairments: Time, Situation Awareness: Intellectual awareness impaired, Online awareness impaired Memory impairment (select all impairments): Short-term memory, Declarative long-term memory Attention impairment (select first level of impairment): Sustained attention Executive  functioning impairment (select  all impairments): Sequencing, Reasoning, Problem solving, Initiation                   Following commands: Impaired Following commands impaired: Follows one step commands with increased time      Cueing   Cueing Techniques: Verbal cues, Tactile cues        General Comments VSS on 9L HFNC (RN in room decreasing to 7L end of session)    Pertinent Vitals/ Pain       Pain Assessment Pain Assessment: No/denies pain Pain Score: 0-No pain         Frequency  Min 2X/week        Progress Toward Goals  OT Goals(current goals can now be found in the care plan section)  Progress towards OT goals: Progressing toward goals  Acute Rehab OT Goals OT Goal Formulation: With patient Time For Goal Achievement: 01/03/24 Potential to Achieve Goals: Good ADL Goals Pt Will Perform Grooming: with modified independence;sitting;standing Pt Will Perform Lower Body Dressing: with modified independence;sitting/lateral leans;sit to/from stand Pt Will Transfer to Toilet: with modified independence;ambulating Pt Will Perform Toileting - Clothing Manipulation and hygiene: with modified independence;sitting/lateral leans;sit to/from stand  Plan         AM-PAC OT 6 Clicks Daily Activity     Outcome Measure   Help from another person eating meals?: A Little Help from another person taking care of personal grooming?: A Little Help from another person toileting, which includes using toliet, bedpan, or urinal?: A Lot Help from another person bathing (including washing, rinsing, drying)?: A Lot Help from another person to put on and taking off regular upper body clothing?: A Lot Help from another person to put on and taking off regular lower body clothing?: A Lot 6 Click Score: 14    End of Session Equipment Utilized During Treatment: Oxygen  OT Visit Diagnosis: Other abnormalities of gait and mobility (R26.89)   Activity Tolerance Patient limited by lethargy   Patient Left in bed;with  call bell/phone within reach;with bed alarm set   Nurse Communication Mobility status        Time: 8660-8650 OT Time Calculation (min): 10 min  Charges: OT General Charges $OT Visit: 1 Visit OT Treatments $Self Care/Home Management : 8-22 mins  Elveria Lauderbaugh L. Neelie Welshans, OTR/L  12/27/2023, 2:08 PM

## 2023-12-27 NOTE — Plan of Care (Signed)
°  Problem: Clinical Measurements: Goal: Ability to maintain clinical measurements within normal limits will improve Outcome: Progressing   Problem: Clinical Measurements: Goal: Respiratory complications will improve Outcome: Progressing   Problem: Pain Managment: Goal: General experience of comfort will improve and/or be controlled Outcome: Progressing   Problem: Safety: Goal: Ability to remain free from injury will improve Outcome: Progressing   Problem: Activity: Goal: Ability to tolerate increased activity will improve Outcome: Progressing

## 2023-12-28 DIAGNOSIS — B348 Other viral infections of unspecified site: Secondary | ICD-10-CM | POA: Diagnosis not present

## 2023-12-28 MED ORDER — LACTATED RINGERS IV BOLUS
500.0000 mL | Freq: Once | INTRAVENOUS | Status: AC
  Administered 2023-12-28: 19:00:00 500 mL via INTRAVENOUS

## 2023-12-28 NOTE — Progress Notes (Incomplete)
 PULMONOLOGY         Date: 12/28/2023,   MRN# [id number] Sherry Bryan January 09, 1996     AdmissionWeight: (!) 137.9 kg                 CurrentWeight: (!) 141.4 kg  Referring provider: Dr. Franchot   CHIEF COMPLAINT:   Hypercapnic hypoxemic respiratory failure   HISTORY OF PRESENT ILLNESS   This is a 28 year old female with a history of borderline personality disorder, chronic constipation, major depressive disorder, disruptive mood dysregulation disorder, GERD, history of seizure disorder, intellectual disability, pulmonary embolism, morbid obesity posttraumatic stress disorder, suicidal ideation, who was brought in with respiratory distress and a productive cough significant for expectoration of dark phlegm.  On arrival to the emergency department she was hypoxemic requiring 4 L via nasal cannula to reach 89% and was hypotensive with blood pressure less than 100 systolic.  She did have a CT chest obtained on arrival to the ER with ground glass opacification bilaterally but negative for PE.  She is found to be rhinovirus positive.  CT chest with bilateral infiltrates, ABG with hypercapnia and hypoxemia.  She is being evaluated by psychiatry due to self-harm and history of suicidal ideation with multiple psychiatric diagnoses.  PCCM consultation for progressive hypercapnic respiratory failure with hypoxemia potentially needing home BiPAP.  12/25/23- patient is on HFNC resting in bed in no acute distress. She may be seen in pulmonary clinic post dc home.  PCCM will sign off today.  BIPAP has been ordered for home use. She currently is being treated for rhinovirus associated respiratory failure with hypercapnia and hypoxemia.   12/27/23- patient seen at bedside with now weaning of O2 to 8L/min bubbler HF.  She is in no distress. I'm repeating ABG today.   PAST MEDICAL HISTORY   Past Medical History:  Diagnosis Date   Borderline personality disorder (HCC)    Constipation  05/15/2015   Depression    DMDD (disruptive mood dysregulation disorder) 05/15/2015   History of seasonal allergies 05/15/2015   Hx of gastroesophageal reflux (GERD) 05/15/2015   Hx of seizure disorder 05/15/2015   Intellectual disability 05/22/2015   PTSD (post-traumatic stress disorder)    Seizures (HCC)    last one in 2015   Suicidal ideation      SURGICAL HISTORY   History reviewed. No pertinent surgical history.   FAMILY HISTORY   History reviewed. No pertinent family history.   SOCIAL HISTORY   Social History[1]   MEDICATIONS     Current Medication: Current Medications[2]    ALLERGIES   Ritalin [methylphenidate hcl], Abilify [aripiprazole], and Lithium      REVIEW OF SYSTEMS    Review of Systems:  Gen:  Denies  fever, sweats, chills weigh loss  HEENT: Denies blurred vision, double vision, ear pain, eye pain, hearing loss, nose bleeds, sore throat Cardiac:  No dizziness, chest pain or heaviness, chest tightness,edema Resp:   reports dyspnea chronically  Gi: Denies swallowing difficulty, stomach pain, nausea or vomiting, diarrhea, constipation, bowel incontinence Gu:  Denies bladder incontinence, burning urine Ext:   Denies Joint pain, stiffness or swelling Skin: Denies  skin rash, easy bruising or bleeding or hives Endoc:  Denies polyuria, polydipsia , polyphagia or weight change Psych:   Denies depression, insomnia or hallucinations   Other:  All other systems negative   VS: BP 90/79 (BP Location: Left Arm)   Pulse 95   Temp 98.3 F (36.8 C) (Oral)   Resp 16  Ht 5' 4 (1.626 m)   Wt (!) 141.4 kg   SpO2 90%   BMI 53.51 kg/m      PHYSICAL EXAM    GENERAL:NAD, no fevers, chills, no weakness no fatigue HEAD: Normocephalic, atraumatic.  EYES: Pupils equal, round, reactive to light. Extraocular muscles intact. No scleral icterus.  MOUTH: Moist mucosal membrane. Dentition intact. No abscess noted.  EAR, NOSE, THROAT: Clear without exudates. No  external lesions.  NECK: Supple. No thyromegaly. No nodules. No JVD.  PULMONARY: decreased breath sounds with mild rhonchi worse at bases bilaterally.  CARDIOVASCULAR: S1 and S2. Regular rate and rhythm. No murmurs, rubs, or gallops. No edema. Pedal pulses 2+ bilaterally.  GASTROINTESTINAL: Soft, nontender, nondistended. No masses. Positive bowel sounds. No hepatosplenomegaly.  MUSCULOSKELETAL: No swelling, clubbing, or edema. Range of motion full in all extremities.  NEUROLOGIC: Cranial nerves II through XII are intact. No gross focal neurological deficits. Sensation intact. Reflexes intact.  SKIN: No ulceration, lesions, rashes, or cyanosis. Skin warm and dry. Turgor intact.  PSYCHIATRIC: Mood, affect within normal limits. The patient is awake, alert and oriented x 3. Insight, judgment intact.       IMAGING    CT Head Wo Contrast Final result 12/17/2023 10:52 AM        EXAM: CT HEAD WITHOUT CONTRAST 12/17/2023 10:52:44 AM  TECHNIQUE: CT of the head was performed without the administration of intravenous contrast. Automated exposure control, iterative reconstruction, and/or weight based adjustment of the mA/kV was utilized to reduce the radiation dose to as low as reasonably achievable.  ...        Study Result  Narrative & Impression  EXAM: CTA of the Chest without and with contrast for PE 12/17/2023 10:52:44 AM   TECHNIQUE: CTA of the chest was performed without and with the administration of 75 mL of iohexol  (OMNIPAQUE ) 350 MG/ML intravenous contrast. Multiplanar reformatted images are provided for review. MIP images are provided for review. Automated exposure control, iterative reconstruction, and/or weight based adjustment of the mA/kV was utilized to reduce the radiation dose to as low as reasonably achievable.   COMPARISON: None available.   CLINICAL HISTORY: Pulmonary embolism (PE) suspected, high prob.   FINDINGS:   PULMONARY ARTERIES: Pulmonary arteries  are adequately opacified for evaluation. No pulmonary embolism. Main pulmonary artery is normal in caliber.   MEDIASTINUM: The heart and pericardium demonstrate no acute abnormality. There is no acute abnormality of the thoracic aorta.   LYMPH NODES: No mediastinal, hilar or axillary lymphadenopathy.   LUNGS AND PLEURA: There are ground glass and streaky opacities present within the lower lobes bilaterally. The nondependent lungs are relatively clear. No pleural effusion or pneumothorax.   UPPER ABDOMEN: Limited images of the upper abdomen are unremarkable.   SOFT TISSUES AND BONES: No acute bone or soft tissue abnormality.   IMPRESSION: 1. No pulmonary embolism. 2. Ground-glass and streaky opacities in the lower lobes bilaterally, which may reflect atelectasis, aspiration, or atypical/viral pneumonia; correlate clinically.   Electronically signed by: Evalene Coho MD 12/17/2023 11:00 AM EST RP Workstation: [id number]      ASSESSMENT/PLAN   Hypercapnic hypoxemic respiratory failure    Patient qualifies for BIPAP and likely needs it long term due to morbid obesity and OSA overlap  -have placed BIPAP for home use - rhinovirus is most likely cause of ground glass opacities on CT chest but this would not explain the hypercapnic failure - I can follow up with her on outpatient to check settings and make adjustments -  PCO2 >55 on ABG Repeating ABG today and CXR today  -O2 weaned to 8L/min [place]       Thank you for allowing me to participate in the care of this patient.   Patient/Family are satisfied with care plan and all questions have been answered.    Provider disclosure: Patient with at least one acute or chronic illness or injury that poses a threat to life or bodily function and is being managed actively during this encounter.  All of the below services have been performed independently by signing provider:  review of prior documentation from internal and or  external health records.  Review of previous and current lab results.  Interview and comprehensive assessment during patient visit today. Review of current and previous chest radiographs/CT scans. Discussion of management and test interpretation with health care team and patient/family.   This document was prepared using Dragon voice recognition software and may include unintentional dictation errors.     Leeandra Ellerson, M.D.  Division of Pulmonary & Critical Care Medicine                [1]  Social History Tobacco Use   Smoking status: Former    Types: Cigarettes   Smokeless tobacco: Never  Vaping Use   Vaping status: Every Day  Substance Use Topics   Alcohol use: No   Drug use: No  [2]  Current Facility-Administered Medications:    acetaminophen  (TYLENOL ) tablet 650 mg, 650 mg, Oral, Q6H PRN, 650 mg at 12/27/23 2028 **OR** acetaminophen  (TYLENOL ) suppository 650 mg, 650 mg, Rectal, Q6H PRN, Foust, Katy L, NP   albuterol  (PROVENTIL ) (2.5 MG/3ML) 0.083% nebulizer solution 2.5 mg, 2.5 mg, Nebulization, Q2H PRN, Foust, Katy L, NP, 2.5 mg at 12/24/23 2040   apixaban  (ELIQUIS ) tablet 5 mg, 5 mg, Oral, BID, Jessup, Charles, MD, 5 mg at 12/28/23 9170   benztropine  (COGENTIN ) tablet 0.5 mg, 0.5 mg, Oral, Daily, 0.5 mg at 12/28/23 0829 **AND** benztropine  (COGENTIN ) tablet 1 mg, 1 mg, Oral, QHS, Franchot Novel, MD, 1 mg at 12/27/23 2029   dextromethorphan -guaiFENesin  (MUCINEX  DM) 30-600 MG per 12 hr tablet 1 tablet, 1 tablet, Oral, BID, Franchot Novel, MD, 1 tablet at 12/28/23 0830   divalproex  (DEPAKOTE ) DR tablet 1,000 mg, 1,000 mg, Oral, BID, Jessup, Charles, MD, 1,000 mg at 12/28/23 0830   escitalopram  (LEXAPRO ) tablet 10 mg, 10 mg, Oral, Daily, Jessup, Charles, MD, 10 mg at 12/28/23 9168   ferrous sulfate  tablet 325 mg, 325 mg, Oral, Q breakfast, Franchot Novel, MD, 325 mg at 12/28/23 0831   folic acid  (FOLVITE ) tablet 1 mg, 1 mg, Oral, Daily, Willo Dunnings, MD, 1 mg  at 12/28/23 0831   lactulose  (CHRONULAC ) 10 GM/15ML solution 20 g, 20 g, Oral, Daily, Franchot Novel, MD, 20 g at 12/28/23 9167   levETIRAcetam  (KEPPRA ) tablet 500 mg, 500 mg, Oral, BID, Jessup, Charles, MD, 500 mg at 12/28/23 0831   levothyroxine  (SYNTHROID ) tablet 50 mcg, 50 mcg, Oral, Q0600, Dail Rankin RAMAN, RPH, 50 mcg at 12/28/23 9371   LORazepam  (ATIVAN ) tablet 0.5 mg, 0.5 mg, Oral, QHS, Franchot Novel, MD, 0.5 mg at 12/27/23 2025   metoprolol  succinate (TOPROL -XL) 24 hr tablet 25 mg, 25 mg, Oral, Daily, Franchot Novel, MD, 25 mg at 12/28/23 9167   ondansetron  (ZOFRAN ) tablet 4 mg, 4 mg, Oral, Q6H PRN, 4 mg at 12/22/23 1124 **OR** ondansetron  (ZOFRAN ) injection 4 mg, 4 mg, Intravenous, Q6H PRN, Foust, Katy L, NP, 4 mg at 12/28/23 1026   pantoprazole  (PROTONIX )  EC tablet 80 mg, 80 mg, Oral, Daily, Jessup, Charles, MD, 80 mg at 12/28/23 9167   QUEtiapine  (SEROQUEL ) tablet 12.5 mg, 12.5 mg, Oral, QHS, Franchot Novel, MD, 12.5 mg at 12/27/23 2027   senna-docusate (Senokot-S) tablet 1 tablet, 1 tablet, Oral, QHS, Franchot Novel, MD, 1 tablet at 12/27/23 2025   traZODone  (DESYREL ) tablet 75 mg, 75 mg, Oral, QHS, Franchot Novel, MD, 75 mg at 12/27/23 2027   Xanomeline-Trospium  Chloride 125-30 MG CAPS 1 capsule, 1 capsule, Oral, BID, Greenwood, Howard F, RPH, 125 mg at 12/28/23 [phone]

## 2023-12-28 NOTE — Progress Notes (Signed)
 PROGRESS NOTE    Sherry Bryan  FMW:[id number] DOB: 06-Sep-1995 DOA: 12/16/2023 PCP: Pcp, No  Chief Complaint  Patient presents with   Suicidal    Hospital Course:  Sherry Bryan is a 28 y.o. year old female with past medical history of hypertension, hypothyroidism, seizure disorder, GERD, prior blood clots on Eliquis , major depressive disorder, DMDD, intellectual disability, borderline personality disorder, PTSD, and adjustment disorder.  She presented to [facility] regional ED yesterday with reports of suicidal ideation and productive cough.  Ultimately she was medically cleared and she remained in the ED voluntary status pending psychiatric evaluation.  This morning she reports that she has had a productive cough with green sputum, dyspnea, and chest pain associated with coughing for about 1 week.  She has not had any known sick contacts.  ED Course: This morning patient was noted to be afebrile temp 38.6 C, BP 98/71 and as low as 89/53 with MAP of 63, RR 21, SpO2 89% on room air and improving to 93% on 4 L via nasal cannula. CT head obtained and shows no acute intracranial abnormality with mucosal thickening and air-fluid levels in bilateral maxillary ethmoid sinuses and left sphenoid sinus.  CTA PE obtained and negative for PE, did show ground glass and streaky opacities in the lower lobes bilaterally. Labs notable for leukocytosis WBC 10.9 yesterday on ED arrival however improved to 7.7 this morning, lactic acid mildly elevated at 2.5, blood cultures obtained and in process.  BMP ordered not collected for this morning yesterday BMP grossly normal with mild hyperglycemia with glucose 125 and calcium 8.8.  COVID flu and RSV negative yesterday.  She was given Tylenol , Rocephin , doxycycline , and home meds including antihypertensives.  Patient was evaluated by telepsychiatry while in the ED who found patient to be at moderate risk of self-harm in the current environment and plan for reassessment  this morning for final psychiatric disposition recommendations.  TR[facility] contacted for admission.   Subjective: Patient reports difficulty having a bowel movement this morning.  Last bowel movement yesterday She also reports difficulty with urination reports she last urinated yesterday.  Bladder scan shows 79 mL.  Have asked bedside RN to straight cath  Objective: Vitals:   12/28/23 1028 12/28/23 1034 12/28/23 1037 12/28/23 1042  BP:      Pulse:      Resp:      Temp:      TempSrc:      SpO2: 97% (S) (!) 86% 93% 90%  Weight:      Height:        Intake/Output Summary (Last 24 hours) at 12/28/2023 1224 Last data filed at 12/27/2023 1845 Gross per 24 hour  Intake 480 ml  Output 400 ml  Net 80 ml   Filed Weights   12/16/23 1322 12/24/23 0500  Weight: (!) 137.9 kg (!) 141.4 kg    Examination: General exam: Appears calm and comfortable, NAD  Respiratory system: No work of breathing, symmetric chest wall expansion Cardiovascular system: S1 & S2 heard, RRR.  Gastrointestinal system: Abdomen is obese, soft, mildly tender to palpation in suprapubic region Neuro: Alert and oriented. No focal neurological deficits. Extremities: Symmetric, expected ROM Skin: No rashes, lesions Psychiatry: Demonstrates appropriate judgement and insight. Mood & affect appropriate for situation.   Assessment & Plan:  Principal Problem:   Rhinovirus infection Active Problems:   Hx of seizure disorder   Hx of gastroesophageal reflux (GERD)   Intellectual disability   Suicidal ideations   Depression   Acute  hypoxic respiratory failure (HCC)   Hypothyroidism   Essential hypertension   Morbid obesity (HCC)   Schizoaffective disorder (HCC)   PTSD (post-traumatic stress disorder)    Rhinovirus Superimposed bacterial pneumonia Sepsis ruled out - Patient was admitted hypotensive with lactic acidosis, hypoxia, CT chest with bilateral lower lobe infiltrates concerning for pneumonia, atelectasis,  aspiration - RVP positive for rhinovirus/enterovirus.  Procalcitonin less than 0.1 - Blood cultures negative - Sputum cultures were discontinued - Received 5 days ceftriaxone  azithromycin  - Continues to have oxygen requirement - Pursue walk trial today  Difficulty with urination -- Bladder scan 79mL but pt reports she has urinated in >12hours. Straight cath ordered  Acute on chronic hypoxic respiratory failure with hypercapnia - Reportedly had syncopal episode when O2 was removed and sats were in the 80s.  ABG revealed compensated pH - Pulmonology was consulted given persistent hypoxia and hypercapnia concerning for OHS - She will require BiPAP at discharge and close outpatient pulmonology follow-up - Walk trial has been ordered to determine home O2 needs so she will likely discharge directly to a skilled nursing facility. Have asked RN to obtain  Suicidal ideations, resolved - Has been evaluated by psych, they are still following today  History of seizure disorder - Continue home meds  Hypertension - Continue metoprolol  - At home is also taking HCTZ, clonidine , prazosin .  BP currently stable without.  Will resume gradually as needed  Hypothyroidism - Continue Synthroid   Normocytic anemia Iron  deficiency anemia - Hemoglobin remained stable  History of GERD - Continue Protonix   Schizoaffective disorder - Psych has reduced Cogentin   Intellectual disability - Patient has a legal guardian for all decision-making.  TOC aware  PTSD - On clonidine  and prazosin  at home.  Doing well without for now.  Gradually resume as tolerated  Depression - Continue Lexapro , Seroquel , trazodone  - Currently on lower doses due to drowsiness.  Titrate as needed  Body mass index is 53.51 kg/m. Obesity Class III - Outpatient follow up for lifestyle modification and risk factor management   DVT prophylaxis: Eliquis    Code Status: Full Code Disposition:  Pending SNF. Some complexity given  she requires approval from gaurdian. TOC working on it. I personally spoke to and updated her legal guardian, Sherry Bryan 12/17   Consultants:  Treatment Team:  Consulting Physician: Sherry Manna, MD Consulting Physician: Sherry Liming, DO  Procedures:    Antimicrobials:  Anti-infectives (From admission, onward)    Start     Dose/Rate Route Frequency Ordered Stop   12/20/23 1000  azithromycin  (ZITHROMAX ) tablet 500 mg  Status:  Discontinued        500 mg Oral Daily 12/19/23 0944 12/20/23 0947   12/18/23 1000  cefTRIAXone  (ROCEPHIN ) 2 g in sodium chloride  0.9 % 100 mL IVPB        2 g 200 mL/hr over 30 Minutes Intravenous Every 24 hours 12/17/23 1157 12/22/23 1001   12/17/23 1200  azithromycin  (ZITHROMAX ) 500 mg in sodium chloride  0.9 % 250 mL IVPB        500 mg 250 mL/hr over 60 Minutes Intravenous Every 24 hours 12/17/23 1157 12/19/23 1356   12/17/23 1015  cefTRIAXone  (ROCEPHIN ) 1 g in sodium chloride  0.9 % 100 mL IVPB        1 g 200 mL/hr over 30 Minutes Intravenous  Once 12/17/23 1012 12/17/23 1118   12/17/23 1015  doxycycline  (VIBRAMYCIN ) 100 mg in sodium chloride  0.9 % 250 mL IVPB        100 mg  125 mL/hr over 120 Minutes Intravenous  Once 12/17/23 1012 12/17/23 1341       Data Reviewed: I have personally reviewed following labs and imaging studies CBC: Recent Labs  Lab 12/22/23 1106 12/23/23 0329 12/25/23 0425 12/26/23 0822 12/27/23 0503  WBC 8.2 7.7 7.2 7.1 8.5  HGB 12.1 11.9* 11.7* 11.2* 12.6  HCT 39.0 40.7 39.7 37.2 40.8  MCV 80.2 82.6 83.2 82.1 81.1  PLT 156 172 175 190 201   Basic Metabolic Panel: Recent Labs  Lab 12/22/23 1106 12/23/23 0329 12/25/23 0425 12/26/23 0822 12/27/23 0503  NA 138 138 139 137 136  K 4.6 4.5 4.5 4.4 4.6  CL 100 98 99 101 100  CO2 32 32 34* 31 31  GLUCOSE 117* 97 95 83 96  BUN 8 10 16 16 14   CREATININE 0.81 0.87 0.98 0.94 1.00  CALCIUM 9.1 8.9 9.2 9.2 9.5   GFR: Estimated Creatinine Clearance: 120.3 mL/min  (by C-G formula based on SCr of 1 mg/dL). Liver Function Tests: Recent Labs  Lab 12/22/23 1106  AST 28  ALT 22  ALKPHOS 49  BILITOT <0.2  PROT 6.7  ALBUMIN 3.1*   CBG: Recent Labs  Lab 12/21/23 1412  GLUCAP 123*    No results found for this or any previous visit (from the past 240 hours).   Radiology Studies: DG Chest Port 1 View Result Date: 12/27/2023 CLINICAL DATA:  Hypoxic respiratory failure. EXAM: PORTABLE CHEST 1 VIEW COMPARISON:  One view chest 12/16/2023 FINDINGS: Low inspiratory volumes. Obscuring of the left heart border and increased pulmonary opacities consistent with acute bilateral infiltrative process. No right-sided pleural effusion. The left costophrenic angle is obscured by soft tissue. No acute osseous process. No pneumothorax. IMPRESSION: Acute bilateral pulmonary infiltrates Electronically Signed   By: Cordella Banner   On: 12/27/2023 15:47    Scheduled Meds:  apixaban   5 mg Oral BID   benztropine   0.5 mg Oral Daily   And   benztropine   1 mg Oral QHS   dextromethorphan -guaiFENesin   1 tablet Oral BID   divalproex   1,000 mg Oral BID   escitalopram   10 mg Oral Daily   ferrous sulfate   325 mg Oral Q breakfast   folic acid   1 mg Oral Daily   lactulose   20 g Oral Daily   levETIRAcetam   500 mg Oral BID   levothyroxine   50 mcg Oral Q0600   LORazepam   0.5 mg Oral QHS   metoprolol  succinate  25 mg Oral Daily   pantoprazole   80 mg Oral Daily   QUEtiapine   12.5 mg Oral QHS   senna-docusate  1 tablet Oral QHS   trazodone   75 mg Oral QHS   Xanomeline-Trospium  Chloride  1 capsule Oral BID   Continuous Infusions:   LOS: 11 days  MDM: Patient is high risk for one or more organ failure.  They necessitate ongoing hospitalization for continued IV therapies and subsequent lab monitoring. Total time spent interpreting labs and vitals, reviewing the medical record, coordinating care amongst consultants and care team members, directly assessing and discussing care  with the patient and/or family: 55 min  Chevelle Durr, DO [facility]  To contact the attending physician between 7A-7P please use Epic Chat. To contact the covering physician during after hours 7P-7A, please review Amion.  12/28/2023, 12:24 PM   *This document has been created with the assistance of dictation software. Please excuse typographical errors. *

## 2023-12-28 NOTE — TOC Progression Note (Signed)
 Transition of Care [facility]) - Progression Note    Patient Details  Name: Sherry Bryan MRN: [id number] Date of Birth: May 06, 1995  [facility] CM/SW Contact  Shasta DELENA Daring, RN Phone Number: 12/28/2023, 9:37 AM  Clinical Narrative:     PASSR requested. Currently under review. Submitted information requested, including H&P, FL2 signed, psych notes, comprehensive progress notes, 30 day MD certification.   Will continue to follow.                    Expected Discharge Plan and Services                                               Social Drivers of Health (SDOH) Interventions SDOH Screenings   Food Insecurity: No Food Insecurity (12/19/2023)  Housing: Patient Unable To Answer (12/20/2023)  Transportation Needs: Patient Unable To Answer (12/20/2023)  Utilities: Not At Risk (12/19/2023)  Tobacco Use: Medium Risk (12/16/2023)    Readmission Risk Interventions     No data to display

## 2023-12-28 NOTE — Progress Notes (Signed)
 Physical Therapy Treatment Patient Details Name: Sherry Bryan MRN: [id number] DOB: 1995/01/18 Today's Date: 12/28/2023   History of Present Illness Pt is a 28 year old female admitted with sepsis due to pneumonia, acute hypoxic respiratory failure; of note, pt presented to ED with suicidal ideations, Patient currently has no suicidal ideations    PMH significant for  hypertension, hypothyroidism, seizure disorder, GERD, prior blood clots on Eliquis , major depressive disorder, DMDD, intellectual disability, borderline personality disorder, PTSD, and adjustment disorder    PT Comments  Pt was found in sidelying (this seems to be comfortable position for her right now) in bed on arrival, was in some pain 5/10, on 2L [place] was lethargic and was able to participate during the session. She was able to mobilize herself to sitting EOB with supervision and stood without assist to transfer to the [facility]. She was only able to ambulate a short distance (see details below) as she had frequent BM's throughout the session. Pt was trialed on RA during the session and maintained 89-90% with activity on RA, and per nursing was left on 2L [place]. Pt did not report any adverse symptoms or dizziness during the session, other than persistent abdominal pain. Pt will benefit from continued PT services upon discharge to safely address deficits listed in patient problem list for decreased caregiver assistance and eventual return to PLOF.    If plan is discharge home, recommend the following: Two people to help with walking and/or transfers;Two people to help with bathing/dressing/bathroom;Help with stairs or ramp for entrance;Assist for transportation;Direct supervision/assist for financial management;Direct supervision/assist for medications management;Assistance with cooking/housework   Can travel by private vehicle     Yes  Equipment Recommendations  Other (comment) (defer to next venue)    Recommendations for Other Services        Precautions / Restrictions Precautions Precautions: Fall Recall of Precautions/Restrictions: Impaired Precaution/Restrictions Comments: monitor O2 Restrictions Weight Bearing Restrictions Per Provider Order: No     Mobility  Bed Mobility Overal bed mobility: Needs Assistance Bed Mobility: Supine to Sit, Sit to Supine     Supine to sit: Supervision Sit to supine: Supervision   General bed mobility comments: pt was able to sit from supine to EOB with supervision, cueing for use of bed rails and was able to scoot herself to the EOB Patient Response: Flat affect  Transfers Overall transfer level: Needs assistance Equipment used: None Transfers: Sit to/from Stand, Bed to chair/wheelchair/BSC Sit to Stand: Supervision           General transfer comment: supervision needed for STS    Ambulation/Gait Ambulation/Gait assistance: Supervision Gait Distance (Feet): 10 Feet Assistive device: None Gait Pattern/deviations: Step-through pattern, Decreased stride length Gait velocity: decreased     General Gait Details: unable to do much more than 42ft of ambulation due to BMs throughout the session. Pt does well short distances, fowards, backwars and lateral stepping.    Stairs             Wheelchair Mobility     Tilt Bed Tilt Bed Patient Response: Flat affect  Modified Rankin (Stroke Patients Only)       Balance Overall balance assessment: Needs assistance Sitting-balance support: Feet supported Sitting balance-Leahy Scale: Good Sitting balance - Comments: sat EOB without assist but cueing was needed Postural control: Posterior lean Standing balance support: During functional activity, No upper extremity supported Standing balance-Leahy Scale: Good Standing balance comment: pt stood without physical assist  Communication Communication Communication: Impaired Factors Affecting Communication: Difficulty  expressing self  Cognition Arousal: Lethargic Behavior During Therapy: Flat affect   PT - Cognitive impairments: No family/caregiver present to determine baseline                       PT - Cognition Comments: Pt is very lethargic which greatly impacts session progression Following commands: Impaired Following commands impaired: Follows one step commands with increased time    Cueing    Exercises Other Exercises Other Exercises: pt edu on the benefits on physical activity and strengthening activities for overall wellbeing    General Comments General comments (skin integrity, edema, etc.): 2L [place] at the start of the session. Pt was trialed on RA, but was consistent at 89-90% with activity. Per nursing, pt was put back on 2L and left on 2L at the end of session      Pertinent Vitals/Pain Pain Assessment Pain Assessment: 0-10 Pain Score: 5  Pain Location: abdominal Pain Descriptors / Indicators: Discomfort    Home Living                          Prior Function            PT Goals (current goals can now be found in the care plan section) Acute Rehab PT Goals Patient Stated Goal: rehab then back to group home PT Goal Formulation: With patient Time For Goal Achievement: 01/04/24 Potential to Achieve Goals: Fair Progress towards PT goals: Progressing toward goals    Frequency    Min 2X/week      PT Plan      Co-evaluation              AM-PAC PT 6 Clicks Mobility   Outcome Measure  Help needed turning from your back to your side while in a flat bed without using bedrails?: A Little Help needed moving from lying on your back to sitting on the side of a flat bed without using bedrails?: A Little Help needed moving to and from a bed to a chair (including a wheelchair)?: A Little Help needed standing up from a chair using your arms (e.g., wheelchair or bedside chair)?: A Little Help needed to walk in hospital room?: A Lot Help needed  climbing 3-5 steps with a railing? : A Lot 6 Click Score: 16    End of Session Equipment Utilized During Treatment: Oxygen Activity Tolerance: Patient limited by lethargy;Other (comment);Treatment limited secondary to medical complications (Comment) Patient left: in bed;with call bell/phone within reach;with bed alarm set Nurse Communication: Mobility status PT Visit Diagnosis: Unsteadiness on feet (R26.81);Other abnormalities of gait and mobility (R26.89);Difficulty in walking, not elsewhere classified (R26.2)     Time: 8871-8848 PT Time Calculation (min) (ACUTE ONLY): 23 min  Charges:                            Corean Newport, SPT 12/28/2023, 2:04 PM

## 2023-12-28 NOTE — NC FL2 (Signed)
 [place]  MEDICAID [id number] LEVEL OF CARE FORM     IDENTIFICATION  Patient Name: Sherry Bryan Birthdate: 1995-09-08 Sex: female Admission Date (Current Location): 12/16/2023  [facility] and [place] Number:  Chiropodist and Address:  [facility], [address], [place], [place] [address]      Provider Number: [id number]  Attending Physician Name and Address:  Leesa Kast, DO  Relative Name and Phone Number:       Current Level of Care: Hospital Recommended Level of Care: Skilled Nursing Facility Prior Approval Number:    Date Approved/Denied:   PASRR Number: Under Review  Discharge Plan: SNF    Current Diagnoses: Patient Active Problem List   Diagnosis Date Noted   Rhinovirus infection 12/24/2023   Sepsis due to pneumonia (HCC) 12/17/2023   Acute hypoxic respiratory failure (HCC) 12/17/2023   Hypothyroidism 12/17/2023   Essential hypertension 12/17/2023   Morbid obesity (HCC) 12/17/2023   Schizoaffective disorder (HCC) 12/17/2023   PTSD (post-traumatic stress disorder) 12/17/2023   Conduct disorder, adolescent-onset type, 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   Aggressive behavior of adult 02/15/2023   Depression 01/28/2023   Abnormal CT of brain 02/01/2017   Borderline personality disorder (HCC)    Lithium  toxicity 12/19/2016   Suicidal ideation 12/19/2016   Homicidal ideations    Suicidal ideations    MDD (major depressive disorder), recurrent severe, 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   Intellectual disability 05/22/2015   MDD (major depressive disorder), recurrent, severe, 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   Hx of seizure disorder 05/15/2015   Constipation 05/15/2015   Hx of gastroesophageal reflux (GERD) 05/15/2015   History of seasonal allergies 05/15/2015    Orientation RESPIRATION BLADDER Height & Weight     Self, Time, Situation, Place  O2 (2L  continuous, nocturnal bi-pap) Continent Weight: (!) 141.4 kg Height:  5' 4 (162.6 cm)  BEHAVIORAL SYMPTOMS/MOOD NEUROLOGICAL BOWEL NUTRITION STATUS   (None) Convulsions/Seizures (history of) Continent Diet  AMBULATORY STATUS COMMUNICATION OF NEEDS Skin   Limited Assist Verbally Normal                       Personal Care Assistance Level of Assistance  Bathing, Dressing Bathing Assistance: Limited assistance   Dressing Assistance: Limited assistance     Functional Limitations Info  Sight, Hearing, Speech Sight Info: Adequate Hearing Info: Adequate Speech Info: Adequate    SPECIAL CARE FACTORS FREQUENCY  PT (By licensed PT), OT (By licensed OT)     PT Frequency: 5x day OT Frequency: 5x day            Contractures Contractures Info: Not present    Additional Factors Info  Code Status, Allergies Code Status Info: full Allergies Info: ritalin, abilify, lithium            Current Medications (12/28/2023):  This is the current hospital active medication list Current Facility-Administered Medications  Medication Dose Route Frequency Provider Last Rate Last Admin   acetaminophen  (TYLENOL ) tablet 650 mg  650 mg Oral Q6H PRN Foust, Katy L, NP   650 mg at 12/27/23 2028   Or   acetaminophen  (TYLENOL ) suppository 650 mg  650 mg Rectal Q6H PRN Foust, Katy L, NP       albuterol  (PROVENTIL ) (2.5 MG/3ML) 0.083% nebulizer solution 2.5 mg  2.5 mg Nebulization Q2H PRN Foust, Katy L, NP   2.5 mg at 12/24/23 2040   apixaban  (ELIQUIS ) tablet 5 mg  5 mg Oral BID Jessup, Charles, MD   5 mg at 12/28/23 9170   benztropine  (COGENTIN ) tablet 0.5 mg  0.5 mg Oral Daily Franchot Novel, MD   0.5 mg at 12/28/23 9170   And   benztropine  (COGENTIN ) tablet 1 mg  1 mg Oral QHS Franchot Novel, MD   1 mg at 12/27/23 2029   dextromethorphan -guaiFENesin  (MUCINEX  DM) 30-600 MG per 12 hr tablet 1 tablet  1 tablet Oral BID Franchot Novel, MD   1 tablet at 12/28/23 0830   divalproex  (DEPAKOTE )  DR tablet 1,000 mg  1,000 mg Oral BID Jessup, Charles, MD   1,000 mg at 12/28/23 0830   escitalopram  (LEXAPRO ) tablet 10 mg  10 mg Oral Daily Jessup, Charles, MD   10 mg at 12/28/23 0831   ferrous sulfate  tablet 325 mg  325 mg Oral Q breakfast Franchot Novel, MD   325 mg at 12/28/23 0831   folic acid  (FOLVITE ) tablet 1 mg  1 mg Oral Daily Jessup, Charles, MD   1 mg at 12/28/23 0831   lactulose  (CHRONULAC ) 10 GM/15ML solution 20 g  20 g Oral Daily Franchot Novel, MD   20 g at 12/28/23 9167   levETIRAcetam  (KEPPRA ) tablet 500 mg  500 mg Oral BID Jessup, Charles, MD   500 mg at 12/28/23 0831   levothyroxine  (SYNTHROID ) tablet 50 mcg  50 mcg Oral Q0600 Dail Rankin RAMAN, RPH   50 mcg at 12/28/23 9371   LORazepam  (ATIVAN ) tablet 0.5 mg  0.5 mg Oral QHS Franchot Novel, MD   0.5 mg at 12/27/23 2025   metoprolol  succinate (TOPROL -XL) 24 hr tablet 25 mg  25 mg Oral Daily Franchot Novel, MD   25 mg at 12/28/23 9167   ondansetron  (ZOFRAN ) tablet 4 mg  4 mg Oral Q6H PRN Foust, Katy L, NP   4 mg at 12/22/23 1124   Or   ondansetron  (ZOFRAN ) injection 4 mg  4 mg Intravenous Q6H PRN Foust, Katy L, NP   4 mg at 12/20/23 2227   pantoprazole  (PROTONIX ) EC tablet 80 mg  80 mg Oral Daily Jessup, Charles, MD   80 mg at 12/28/23 9167   QUEtiapine  (SEROQUEL ) tablet 12.5 mg  12.5 mg Oral QHS Franchot Novel, MD   12.5 mg at 12/27/23 2027   senna-docusate (Senokot-S) tablet 1 tablet  1 tablet Oral QHS Franchot Novel, MD   1 tablet at 12/27/23 2025   traZODone  (DESYREL ) tablet 75 mg  75 mg Oral QHS Carter, [place], MD   75 mg at 12/27/23 2027   Xanomeline-Trospium  Chloride 125-30 MG CAPS 1 capsule  1 capsule Oral BID Greenwood, Howard F, RPH   125 mg at 12/28/23 9167     Discharge Medications: Please see discharge summary for a list of discharge medications.  Relevant Imaging Results:  Relevant Lab Results:   Additional Information    Shasta DELENA Daring, RN

## 2023-12-28 NOTE — TOC CM/SW Note (Signed)
 RE:  Sherry Bryan    Date of Birth: Jan 03, 1997_____   Date:  12/28/2023        To Whom It May Concern:  Please be advised that the above-named patient will require a short-term nursing home stay - anticipated 30 days or less for rehabilitation and strengthening.  The plan is for return home.

## 2023-12-28 NOTE — TOC Progression Note (Signed)
 Transition of Care [facility]) - Progression Note    Patient Details  Name: Sherry Bryan MRN: [id number] Date of Birth: [birth year]  Transition of Care [facility]) CM/SW Contact  Lauraine JAYSON Carpen, LCSW Phone Number: 12/28/2023, 3:53 PM  Clinical Narrative:   PASARR under level 2 review.  Expected Discharge Plan and Services                                               Social Drivers of Health (SDOH) Interventions SDOH Screenings   Food Insecurity: No Food Insecurity (12/19/2023)  Housing: Patient Unable To Answer (12/20/2023)  Transportation Needs: Patient Unable To Answer (12/20/2023)  Utilities: Not At Risk (12/19/2023)  Tobacco Use: Medium Risk (12/16/2023)    Readmission Risk Interventions     No data to display

## 2023-12-28 NOTE — Consult Note (Signed)
 [facility] Consult Follow Up  Patient Name: .Sherry Bryan  MRN: [id number]  DOB: 1995-06-16  Consult Order details:  Orders (From admission, onward)     Start     Ordered   12/16/23 1406  IP CONSULT TO PSYCHIATRY       Ordering Provider: Willo Dunnings, MD  Provider:  (Not yet assigned)  Question:  Reason for consult:  Answer:  Medication management   12/16/23 1406   12/16/23 1406  CONSULT TO CALL ACT TEAM       Ordering Provider: Willo Dunnings, MD  Provider:  (Not yet assigned)  Question:  Reason for Consult?  Answer:  SI   12/16/23 1406             Mode of Visit: In person    Psychiatry Consult Evaluation  Service Date: December 28, 2023 LOS:  LOS: 11 days  Chief Complaint I ran off  Primary Psychiatric Diagnoses   Suicidal ideations   Assessment   Sherry Bryan is a 29 y.o. female admitted: Presented to the EDfor 12/16/2023  1:53 PM for suicidal ideation . She carries the psychiatric diagnoses of intellectual disability, borderline personality disorder, and PTSD  and has a past medical history of  blood clots on Eliquis , seizures.  On assessment today, patient only participated minimally stating she did not feel physically well.  Current exam was limited due to this.  Patient was just seen here yesterday for suicidal ideation.  Patient reported running off from her group home due to not feeling safe.  When asked to elaborate, patient stated well I miss my mom and dad and its close to the holidays and it always gets worse.  Patient denied that anyone was physically or emotionally harming her at the group home.  As stated in triage note, patient endorsed suicidal thoughts with plan but denied intent. This is a chronic presentation for patient.   Patient denied homicidal ideation.  Patient denied auditory or visual hallucinations as well.  Patient reported being compliant with current medication regimen. Patient is established with outpatient  providers.  This provider attempted to contact group home and legal guardian.  No answer at this time, HIPAA compliant voicemail left for legal guardian requesting return call.  Patient will be observed overnight to ensure safety and monitor for any unsafe behaviors and allow for psychiatry team to get into contact with both group home and legal guardian.  12/17/2023: Patient seen on rounds today by psychiatry.  Patient reported still feeling not good when referring to her physical health.  Patient still endorsed suicidal thoughts, but reported today that her plan was to take her medications.  Patient currently resides at a group home and medications are given by group home staff.  Patient does not have current access to her medications as they are given by staff.  Upon further questioning, patient reported she feels as though she always misses her parents more around the holidays and it is hard for her to handle.  Patient denied that she had ever spoken with a therapist about these concerns.  Patient did report that she would be willing to speak with a therapist about these things if she had one.  Patient reported she felt as though this would help.  Patient denied homicidal ideations.  Patient denied auditory or visual hallucinations as well.  Patient has continued to maintain safe behaviors while in the emergency department.  Per nursing staff and review of chart, patient has displayed no self-harm behaviors.  It does not appear that patient has required any IM agitation medications while holding in the emergency room. Although the patient endorses chronic suicidal ideation with an identified plan, they currently reside in a structured group home environment and have historically demonstrated the ability to maintain safety within that setting. The support and supervision available in the group home appear sufficient for ongoing community safety.  We will recommend that patient be established with therapy  services along with her medication management team that sees her in the group home.  At this time, psychiatry will sign off.  Patient is currently being medically admitted.  Please reach out for any new concerns or questions.  12/26/2023: Psychiatry was reconsulted by the hospital team due to concerns regarding polypharmacy and increased lethargy in the patient. On today's examination, the patient was alert and oriented x4 but was observed to fall asleep intermittently during the assessment, requiring redirection to continue the interview. When awake, she provided appropriate responses to questions. The patient denied experiencing excessive sleepiness or daytime drowsiness prior to her current medical illness and reported no recent medication adjustments, though she is noted to be a poor historian and does have a legal guardian. Review of her medication regimen reveals she is prescribed Lexapro  10 mg daily, Cogentin  1 mg twice daily, Seroquel  XR, trazodone , Uzedy  LAI and Cobenfy .  Reference medical team note for other medications that have been held for medical indications including clonidine , prazosin  and decreasing daily Ativan  dose.  The medical team has already initiated downward titration of her Seroquel  from baseline dose (50 mg nightly) to 12.5 mg nightly and trazodone  to 75 mg nightly (originally 300 mg nightly) given concerns for sedation. The patient currently denies suicidal and homicidal ideations as well as auditory or visual hallucinations. On current presentation there was no evidence of psychosis and patient did not appear to be responding to internal stimuli.When asked about previous movement disorders or indications for her current Cogentin  therapy, the patient was unable to provide reliable history. No extrapyramidal symptoms were noted on today's examination, though assessment is limited given her acute medical illness and current need for high-flow nasal cannula. This provider discussed with  the medical team that Cogentin  may be contributing to the patient's increased lethargy and recommended considering downward titration in 0.5 mg increments to assess for improvement in alertness. It was also noted that the patient's current acute medical status could be contributing to her lethargic presentation as well. The patient reports she is followed by [facility] for outpatient psychiatric care. Psychiatry will remain available for medication management consultation as needed during her medical admission. The patient does not require acute psychiatric inpatient admission at this time.While future psychiatric events cannot be accurately predicted, the patient does not currently require acute inpatient psychiatric care and does not currently meet [place]  involuntary commitment criteria.   12/18: Patient was rounded on today by psychiatry.  Patient reported doing a little better.  Patient today endorsed suicidal ideation without plan or intent, consistent with chronic baseline presentation. When asked to elaborate on current stressors, patient reports significant dissatisfaction with current group home placement, describing staff as terrible, rude, and disrespectful. Patient identifies this as major contributor to distress. Patient has resided at current group home for approximately one year.  Of note, patient has historically maintained safety in the community under group home supervision despite chronic baseline suicidal ideation. Patient has demonstrated safe behaviors throughout current hospitalization.  Patient denies homicidal ideation. When initially asked about hallucinations, patient  reported seeing her real mom,however, upon clarification, patient describes these experiences occur during sleep and are dream-like in nature rather than true perceptual disturbances. Patient presenting with chronic suicidal ideation without acute change in safety risk. Current distress appears  situational, related to dissatisfaction with group home environment. Patient has demonstrated capacity to maintain safety in supervised community setting and continues to display safe behaviors on unit. No evidence of acute psychosis or perceptual disturbances on current assessment.     Diagnoses:  Active Hospital problems: Principal Problem:   Rhinovirus infection Active Problems:   Hx of seizure disorder   Hx of gastroesophageal reflux (GERD)   Intellectual disability   Suicidal ideations   Depression   Acute hypoxic respiratory failure (HCC)   Hypothyroidism   Essential hypertension   Morbid obesity (HCC)   Schizoaffective disorder (HCC)   PTSD (post-traumatic stress disorder)    Plan   ## Psychiatric Medication Recommendations:  See above under assessment portion of note  ## Medical Decision Making Capacity: Patient has a guardian and has thus been adjudicated incompetent; please involve patients guardian in medical decision making  ## Further Work-up:   -- most recent EKG on 12/16/2023 had QtC of 444    ## Disposition:--Patient is medically admitted  ## Behavioral / Environmental: -Utilize compassion and acknowledge the patient's experiences while setting clear and realistic expectations for care.    ## Safety and Observation Level:  - Based on my clinical evaluation, I estimate the patient to be at low risk of self harm in the current setting. Patient chronically at an elevated risk due to history. Unit can continue with protcol safety checks - At this time, we recommend  routine. This decision is based on my review of the chart including patient's history and current presentation, interview of the patient, mental status examination, and consideration of suicide risk including evaluating suicidal ideation, plan, intent, suicidal or self-harm behaviors, risk factors, and protective factors. This judgment is based on our ability to directly address suicide risk, implement  suicide prevention strategies, and develop a safety plan while the patient is in the clinical setting. Please contact our team if there is a concern that risk level has changed.   Suicide Risk Assessment: Patient has following modifiable risk factors for suicide: N/A which we are addressing by utilizing therapeutic communication to give patient safe space to discuss current emotional status. Patient has following non-modifiable or demographic risk factors for suicide: psychiatric hospitalization Patient has the following protective factors against suicide: Access to outpatient mental health care  Thank you for this consult request. Recommendations have been communicated to the primary team.  We will be available as needed at this time.   Zelda Sharps, NP        History of Present Illness  Relevant Aspects of [facility]   Patient Report:  Collected on initial interview with patient on initial presentation to hospital: On assessment today, patient only participated minimally stating she did not feel physically well.  Current exam was limited due to this.  Patient was just seen here yesterday for suicidal ideation.  Patient reported running off from her group home due to not feeling safe.  When asked to elaborate, patient stated well I miss my mom and dad and its close to the holidays and it always gets worse.  Patient denied that anyone was physically or emotionally harming her at the group home.  As stated in triage note, patient endorsed suicidal thoughts with plan but denied intent. This  is a chronic presentation for patient.   Patient denied homicidal ideation.  Patient denied auditory or visual hallucinations as well.  Patient reported being compliant with current medication regimen. Patient is established with outpatient providers.  This provider attempted to contact group home and legal guardian.  No answer at this time, HIPAA compliant voicemail left for legal guardian requesting  return call.  Patient will be observed overnight to ensure safety and monitor for any unsafe behaviors and allow for psychiatry team to get into contact with both group home and legal guardian.   See updated information under assessment portion of note  Psych ROS:  Depression: Denied Anxiety:  Denied Mania (lifetime and current): Denied Psychosis: (lifetime and current): Denied  Collateral information:  Attempted to contact legal guardian and group home-no answer at this time.      Psychiatric and Social History  Psychiatric History:  Information collected from Patient/chart review  Prev Dx/Sx: Bipolar disorder, Major depressive disorder, and Generalized anxiety disorder , IDD Current Psych Provider: [facility]  Home Meds (current): patient unsure Previous Med Trials: Invega, Haldol, Zyprexa , Abilify, Vraylar, Rexulti per patient report  Therapy: Unknown  Prior Psych Hospitalization: Yes  Prior Self Harm: Yes Prior Violence: Yes  Family Psych History: Patient reports family history of bipolar disorder, ADHD, ODD, and substance abuse in her mother  Family Hx suicide: unknown   Educational Hx: 11th grade Occupational Hx: Unemployed, receives disability Legal Hx: Unknown Living Situation: Group Home Access to weapons/lethal means: Denies   Substance History Alcohol: Denies Tobacco: Patient reports smoking 2 cigarettes/day and vaping Illicit drugs: Denies Prescription drug abuse: Denies Rehab hx: Denies  Exam Findings  Physical Exam: Reviewed and agree with the physical exam findings conducted by the medical provider Vital Signs:  Temp:  [98 F (36.7 C)-98.5 F (36.9 C)] 98.3 F (36.8 C) (12/18 0827) Pulse Rate:  [86-114] 95 (12/18 0827) Resp:  [16-26] 16 (12/18 0827) BP: (90-111)/(59-98) 90/79 (12/18 0827) SpO2:  [86 %-97 %] 90 % (12/18 1042) FiO2 (%):  [60 %] 60 % (12/17 2341) Blood pressure 90/79, pulse 95, temperature 98.3 F (36.8 C), temperature  source Oral, resp. rate 16, height 5' 4 (1.626 m), weight (!) 141.4 kg, SpO2 90%. Body mass index is 53.51 kg/m.    Mental Status Exam: General Appearance: Fairly Groomed  Orientation:  Full (Time, Place, and Person)  Memory:  Fair  Concentration:  Fair  Recall:  Fair  Attention  Fair  Eye Contact:  Fair  Speech:  Slow  Language:  Fair  Volume:  Decreased  Mood: Okay  Affect:  Appropriate  Thought Process:  Coherent  Thought Content:  Logical  Suicidal Thoughts:  Passive S/I reported today  Homicidal Thoughts:  No  Judgement:  Poor  Insight:  Lacking  Psychomotor Activity:  Decreased  Akathisia:  No  Fund of Knowledge:  Fair      Assets:  Therapist, Sports  Cognition:  Impaired,  Moderate  ADL's: Impaired  AIMS (if indicated):        Other History   These have been pulled in through the EMR, reviewed, and updated if appropriate.  Family History:  The patient's family history is not on file.  Medical History: Past Medical History:  Diagnosis Date   Borderline personality disorder (HCC)    Constipation 05/15/2015   Depression    DMDD (disruptive mood dysregulation disorder) 05/15/2015   History of seasonal allergies 05/15/2015   Hx of gastroesophageal reflux (  GERD) 05/15/2015   Hx of seizure disorder 05/15/2015   Intellectual disability 05/22/2015   PTSD (post-traumatic stress disorder)    Seizures (HCC)    last one in 2015   Suicidal ideation     Surgical History: History reviewed. No pertinent surgical history.   Medications:   Current Facility-Administered Medications:    acetaminophen  (TYLENOL ) tablet 650 mg, 650 mg, Oral, Q6H PRN, 650 mg at 12/27/23 2028 **OR** acetaminophen  (TYLENOL ) suppository 650 mg, 650 mg, Rectal, Q6H PRN, Foust, Katy L, NP   albuterol  (PROVENTIL ) (2.5 MG/3ML) 0.083% nebulizer solution 2.5 mg, 2.5 mg, Nebulization, Q2H PRN, Foust, Katy L, NP, 2.5 mg at 12/24/23 2040   apixaban  (ELIQUIS )  tablet 5 mg, 5 mg, Oral, BID, Jessup, Charles, MD, 5 mg at 12/28/23 9170   benztropine  (COGENTIN ) tablet 0.5 mg, 0.5 mg, Oral, Daily, 0.5 mg at 12/28/23 0829 **AND** benztropine  (COGENTIN ) tablet 1 mg, 1 mg, Oral, QHS, Franchot Novel, MD, 1 mg at 12/27/23 2029   dextromethorphan -guaiFENesin  (MUCINEX  DM) 30-600 MG per 12 hr tablet 1 tablet, 1 tablet, Oral, BID, Franchot Novel, MD, 1 tablet at 12/28/23 0830   divalproex  (DEPAKOTE ) DR tablet 1,000 mg, 1,000 mg, Oral, BID, Jessup, Charles, MD, 1,000 mg at 12/28/23 0830   escitalopram  (LEXAPRO ) tablet 10 mg, 10 mg, Oral, Daily, Jessup, Charles, MD, 10 mg at 12/28/23 0831   ferrous sulfate  tablet 325 mg, 325 mg, Oral, Q breakfast, Franchot Novel, MD, 325 mg at 12/28/23 0831   folic acid  (FOLVITE ) tablet 1 mg, 1 mg, Oral, Daily, Willo Dunnings, MD, 1 mg at 12/28/23 0831   lactulose  (CHRONULAC ) 10 GM/15ML solution 20 g, 20 g, Oral, Daily, Franchot Novel, MD, 20 g at 12/28/23 9167   levETIRAcetam  (KEPPRA ) tablet 500 mg, 500 mg, Oral, BID, Jessup, Charles, MD, 500 mg at 12/28/23 0831   levothyroxine  (SYNTHROID ) tablet 50 mcg, 50 mcg, Oral, Q0600, Dail Rankin RAMAN, RPH, 50 mcg at 12/28/23 9371   LORazepam  (ATIVAN ) tablet 0.5 mg, 0.5 mg, Oral, QHS, Franchot Novel, MD, 0.5 mg at 12/27/23 2025   metoprolol  succinate (TOPROL -XL) 24 hr tablet 25 mg, 25 mg, Oral, Daily, Franchot Novel, MD, 25 mg at 12/28/23 9167   ondansetron  (ZOFRAN ) tablet 4 mg, 4 mg, Oral, Q6H PRN, 4 mg at 12/22/23 1124 **OR** ondansetron  (ZOFRAN ) injection 4 mg, 4 mg, Intravenous, Q6H PRN, Foust, Katy L, NP, 4 mg at 12/28/23 1026   pantoprazole  (PROTONIX ) EC tablet 80 mg, 80 mg, Oral, Daily, Jessup, Charles, MD, 80 mg at 12/28/23 9167   QUEtiapine  (SEROQUEL ) tablet 12.5 mg, 12.5 mg, Oral, QHS, Franchot Novel, MD, 12.5 mg at 12/27/23 2027   senna-docusate (Senokot-S) tablet 1 tablet, 1 tablet, Oral, QHS, Franchot Novel, MD, 1 tablet at 12/27/23 2025   traZODone  (DESYREL )  tablet 75 mg, 75 mg, Oral, QHS, Franchot Novel, MD, 75 mg at 12/27/23 2027   Xanomeline-Trospium  Chloride 125-30 MG CAPS 1 capsule, 1 capsule, Oral, BID, Greenwood, Howard F, RPH, 125 mg at 12/28/23 9167  Allergies: Allergies  Allergen Reactions   Ritalin [Methylphenidate Hcl] Other (See Comments)    seizures   Abilify [Aripiprazole] Other (See Comments)    Shaking or tremors   Lithium      Zelda Sharps, NP This note was created using Scientist, clinical (histocompatibility and immunogenetics). Please excuse any inadvertent transcription errors. Case was discussed with supervising physician Dr. Jadapalle who is agreeable with current plan.

## 2023-12-29 ENCOUNTER — Inpatient Hospital Stay: Payer: MEDICAID

## 2023-12-29 DIAGNOSIS — F341 Dysthymic disorder: Secondary | ICD-10-CM | POA: Diagnosis not present

## 2023-12-29 DIAGNOSIS — B348 Other viral infections of unspecified site: Secondary | ICD-10-CM | POA: Diagnosis not present

## 2023-12-29 NOTE — Plan of Care (Signed)

## 2023-12-29 NOTE — Progress Notes (Signed)
 Occupational Therapy Treatment Patient Details Name: Sherry Bryan MRN: [id number] DOB: 1995/08/11 Today's Date: 12/29/2023   History of present illness Pt is a 28 year old female admitted with sepsis due to pneumonia, acute hypoxic respiratory failure; of note, pt presented to ED with suicidal ideations, Patient currently has no suicidal ideations    PMH significant for  hypertension, hypothyroidism, seizure disorder, GERD, prior blood clots on Eliquis , major depressive disorder, DMDD, intellectual disability, borderline personality disorder, PTSD, and adjustment disorder   OT comments  Patient seen for OT treatment on this date. Upon arrival to room patient resting in bed, agreeable to treatment. OT trialed RA, read below for details. Patient engaged in grooming tasks, bathing tasks, transfers and ambulating 40 feet without AD with supervision. OT notified MD and RN of O2 requirements at end of tx.  Patient ended treatment in bed with bed/chair alarm on and all needs within reach. Patient making good progress toward goals, will continue to follow POC. Discharge recommendation remains appropriate.        If plan is discharge home, recommend the following:  Supervision due to cognitive status;Direct supervision/assist for financial management;Direct supervision/assist for medications management;Assistance with cooking/housework;Help with stairs or ramp for entrance;A lot of help with walking and/or transfers;A lot of help with bathing/dressing/bathroom   Equipment Recommendations  BSC/3in1;Tub/shower seat    Recommendations for Other Services      Precautions / Restrictions Precautions Precautions: Fall Recall of Precautions/Restrictions: Impaired Precaution/Restrictions Comments: monitor O2 Restrictions Weight Bearing Restrictions Per Provider Order: No       Mobility Bed Mobility Overal bed mobility: Needs Assistance Bed Mobility: Supine to Sit, Sit to Supine     Supine to  sit: Min assist Sit to supine: Supervision   General bed mobility comments: required min A to go from sidelying to sitting EOB    Transfers Overall transfer level: Needs assistance Equipment used: None Transfers: Sit to/from Stand Sit to Stand: Supervision           General transfer comment: supervision     Balance Overall balance assessment: Needs assistance Sitting-balance support: Feet supported Sitting balance-Leahy Scale: Good     Standing balance support: During functional activity, No upper extremity supported Standing balance-Leahy Scale: Good Standing balance comment: one near LOB during ambulation which patient was able to self correct                           ADL either performed or assessed with clinical judgement   ADL Overall ADL's : Needs assistance/impaired             Lower Body Bathing: Supervison/ safety;Sitting/lateral leans;Sit to/from stand Lower Body Bathing Details (indicate cue type and reason): able to sit EOB to complete task     Lower Body Dressing: Supervision/safety;Sit to/from stand Lower Body Dressing Details (indicate cue type and reason): able to sit in figure 4 to doff/don B socks Toilet Transfer: Supervision/safety;Contact guard [employer] Details (indicate cue type and reason): able to transfer on/off standard height commode without AD           General ADL Comments: able to complete LB tasks with increased time/effort    Extremity/Trunk Assessment Upper Extremity Assessment Upper Extremity Assessment: Generalized weakness   Lower Extremity Assessment Lower Extremity Assessment: Defer to PT evaluation        Vision       Perception     Praxis     Communication  Communication Communication: Impaired Factors Affecting Communication: Difficulty expressing self   Cognition Arousal: Lethargic Behavior During Therapy: Flat affect Cognition: History of cognitive impairments,  Cognition impaired, No family/caregiver present to determine baseline                               Following commands: Impaired Following commands impaired: Follows one step commands with increased time      Cueing   Cueing Techniques: Verbal cues, Tactile cues  Exercises      Shoulder Instructions       General Comments trialed patient on RA, able to perform grooming, ambulating 40 feet and toilet transfers and stayed above 95%, once returned to bed breathing was more shallow and she dropped to 88%- placed back on 2L and notified MD and RN    Pertinent Vitals/ Pain       Pain Assessment Pain Assessment: No/denies pain  Home Living                                          Prior Functioning/Environment              Frequency  Min 2X/week        Progress Toward Goals  OT Goals(current goals can now be found in the care plan section)  Progress towards OT goals: Progressing toward goals  Acute Rehab OT Goals OT Goal Formulation: With patient Time For Goal Achievement: 01/03/24 Potential to Achieve Goals: Good ADL Goals Pt Will Perform Grooming: with modified independence;sitting;standing Pt Will Perform Lower Body Dressing: with modified independence;sitting/lateral leans;sit to/from stand Pt Will Transfer to Toilet: with modified independence;ambulating Pt Will Perform Toileting - Clothing Manipulation and hygiene: with modified independence;sitting/lateral leans;sit to/from stand  Plan      Co-evaluation                 AM-PAC OT 6 Clicks Daily Activity     Outcome Measure   Help from another person eating meals?: A Little Help from another person taking care of personal grooming?: A Little Help from another person toileting, which includes using toliet, bedpan, or urinal?: A Lot Help from another person bathing (including washing, rinsing, drying)?: A Lot Help from another person to put on and taking off regular  upper body clothing?: A Lot Help from another person to put on and taking off regular lower body clothing?: A Lot 6 Click Score: 14    End of Session Equipment Utilized During Treatment: Oxygen  OT Visit Diagnosis: Other abnormalities of gait and mobility (R26.89)   Activity Tolerance Patient limited by lethargy   Patient Left in bed;with call bell/phone within reach;with bed alarm set   Nurse Communication Mobility status        Time: 8972-8952 OT Time Calculation (min): 20 min  Charges: OT General Charges $OT Visit: 1 Visit OT Treatments $Self Care/Home Management : 8-22 mins  Rogers Clause, OT/L MSOT, 12/29/2023

## 2023-12-29 NOTE — Progress Notes (Signed)
 " PROGRESS NOTE    Sherry Bryan  FMW:[id number] DOB: 04-06-1995 DOA: 12/16/2023 PCP: Pcp, No  Chief Complaint  Patient presents with   Suicidal    Hospital Course:  Sherry Bryan is a 28 y.o. year old female with past medical history of hypertension, hypothyroidism, seizure disorder, GERD, prior blood clots on Eliquis , major depressive disorder, DMDD, intellectual disability, borderline personality disorder, PTSD, and adjustment disorder.  She presented to [facility] ED yesterday with reports of suicidal ideation and productive cough.  Ultimately she was medically cleared and she remained in the ED voluntary status pending psychiatric evaluation.  This morning she reports that she has had a productive cough with green sputum, dyspnea, and chest pain associated with coughing for about 1 week.  She has not had any known sick contacts.  ED Course: This morning patient was noted to be afebrile temp 38.6 C, BP 98/71 and as low as 89/53 with MAP of 63, RR 21, SpO2 89% on room air and improving to 93% on 4 L via nasal cannula. CT head obtained and shows no acute intracranial abnormality with mucosal thickening and air-fluid levels in bilateral maxillary ethmoid sinuses and left sphenoid sinus.  CTA PE obtained and negative for PE, did show ground glass and streaky opacities in the lower lobes bilaterally. Labs notable for leukocytosis WBC 10.9 yesterday on ED arrival however improved to 7.7 this morning, lactic acid mildly elevated at 2.5, blood cultures obtained and in process.  BMP ordered not collected for this morning yesterday BMP grossly normal with mild hyperglycemia with glucose 125 and calcium 8.8.  COVID flu and RSV negative yesterday.  She was given Tylenol , Rocephin , doxycycline , and home meds including antihypertensives.  Patient was evaluated by telepsychiatry while in the ED who found patient to be at moderate risk of self-harm in the current environment and plan for reassessment  this morning for final psychiatric disposition recommendations.  [facility] contacted for admission.   Subjective: Yesterday evening patient had multiple bowel movements followed by a vasovagal episode where she nearly syncopized.  She received 500 cc fluid bolus and reports she felt better. Blood pressure has been on the lower side today, morning metoprolol  was held. At the time my evaluation she is sleeping easily.  She denies any acute complaints  Objective: Vitals:   12/29/23 0425 12/29/23 0929 12/29/23 0932 12/29/23 1307  BP: (!) 99/48 (!) 86/48 95/68 (!) 92/59  Pulse: 88 85 97 (!) 102  Resp: 20 20  20   Temp: 98.1 F (36.7 C) 98.4 F (36.9 C)  98.8 F (37.1 C)  TempSrc:  Oral  Oral  SpO2: 96% 98%  93%  Weight:      Height:        Intake/Output Summary (Last 24 hours) at 12/29/2023 1331 Last data filed at 12/29/2023 0932 Gross per 24 hour  Intake 720 ml  Output 700 ml  Net 20 ml   Filed Weights   12/16/23 1322 12/24/23 0500  Weight: (!) 137.9 kg (!) 141.4 kg    Examination: General exam: Appears calm and comfortable, NAD  Respiratory system: No work of breathing, symmetric chest wall expansion Cardiovascular system: S1 & S2 heard, RRR.  Gastrointestinal system: Abdomen is obese, soft, mildly tender to palpation in suprapubic region Neuro: Alert and oriented. No focal neurological deficits. Extremities: Symmetric, expected ROM Skin: No rashes, lesions Psychiatry: Demonstrates appropriate judgement and insight. Mood & affect appropriate for situation.   Assessment & Plan:  Principal Problem:   Rhinovirus infection  Active Problems:   Hx of seizure disorder   Hx of gastroesophageal reflux (GERD)   Intellectual disability   Suicidal ideations   Depression   Acute hypoxic respiratory failure (HCC)   Hypothyroidism   Essential hypertension   Morbid obesity (HCC)   Schizoaffective disorder (HCC)   PTSD (post-traumatic stress disorder)    Rhinovirus Superimposed  bacterial pneumonia Sepsis ruled out - Patient was admitted hypotensive with lactic acidosis, hypoxia, CT chest with bilateral lower lobe infiltrates concerning for pneumonia, atelectasis, aspiration - RVP positive for rhinovirus/enterovirus.  Procalcitonin less than 0.1 - Blood cultures negative - Sputum cultures were discontinued - Received 5 days ceftriaxone  azithromycin  - No recent fever or white count.  Will repeat labs in a.m. - Continues to have oxygen requirement. - Continues to desat with ambulation.  Will not pursue home O2 orders that she will discharge directly to SNF instead.  She may need home oxygen from SNF  Frequent bowel movements - Patient was on aggressive bowel regimen and subsequently had frequent bowel movements yesterday.  Senna and lactulose  discontinued  Difficulty with urination, resolved  Acute on chronic hypoxic respiratory failure with hypercapnia - Reportedly had syncopal episode when O2 was removed and sats were in the 80s.  ABG revealed compensated pH - Pulmonology was consulted given persistent hypoxia and hypercapnia concerning for OHS - She will require BiPAP at discharge and close outpatient pulmonology follow-up -Likely require chronic O2.  Discharging to SNF  Suicidal ideations, resolved - Has been evaluated by psych, they are following.  Will continue current psych meds - Patient has endorsed some concerns about her group home, psychiatry is following up on this.  History of seizure disorder - Continue home meds  Hypotension - Blood pressure has been trending down - Received IV fluid bolus yesterday - Fall precautions - Hold all antihypertensives - She is on Depakote  which can cause hypotension.  Though appears that this is a long-term medication for her  Hypertension - At home on metoprolol , HCTZ, clonidine , prazosin .  Blood pressure currently too low to tolerate these medications.  Holding all - Gradually resume as  needed  Hypothyroidism - Continue Synthroid   Normocytic anemia Iron  deficiency anemia - Hemoglobin remained stable  History of GERD - Continue Protonix   Schizoaffective disorder - Psych has reduced Cogentin   Intellectual disability - Patient has a legal guardian for all decision-making.  TOC aware  PTSD - On clonidine  and prazosin  at home.  Doing well without for now.  Gradually resume as tolerated  Depression - Continue Lexapro , Seroquel , trazodone  - Currently on lower doses due to drowsiness.  Titrate as needed  Body mass index is 53.51 kg/m. Obesity Class III - Outpatient follow up for lifestyle modification and risk factor management   DVT prophylaxis: Eliquis    Code Status: Full Code Disposition:  Pending SNF. Some complexity given she requires approval from gaurdian. TOC working on it. I personally spoke to and updated her legal guardian, [name] 12/17   Consultants:  Treatment Team:  Consulting Physician: Parris Manna, MD Consulting Physician: Justyne Roell, DO  Procedures:    Antimicrobials:  Anti-infectives (From admission, onward)    Start     Dose/Rate Route Frequency Ordered Stop   12/20/23 1000  azithromycin  (ZITHROMAX ) tablet 500 mg  Status:  Discontinued        500 mg Oral Daily 12/19/23 0944 12/20/23 0947   12/18/23 1000  cefTRIAXone  (ROCEPHIN ) 2 g in sodium chloride  0.9 % 100 mL IVPB  2 g 200 mL/hr over 30 Minutes Intravenous Every 24 hours 12/17/23 1157 12/22/23 1001   12/17/23 1200  azithromycin  (ZITHROMAX ) 500 mg in sodium chloride  0.9 % 250 mL IVPB        500 mg 250 mL/hr over 60 Minutes Intravenous Every 24 hours 12/17/23 1157 12/19/23 1356   12/17/23 1015  cefTRIAXone  (ROCEPHIN ) 1 g in sodium chloride  0.9 % 100 mL IVPB        1 g 200 mL/hr over 30 Minutes Intravenous  Once 12/17/23 1012 12/17/23 1118   12/17/23 1015  doxycycline  (VIBRAMYCIN ) 100 mg in sodium chloride  0.9 % 250 mL IVPB        100 mg 125 mL/hr over  120 Minutes Intravenous  Once 12/17/23 1012 12/17/23 1341       Data Reviewed: I have personally reviewed following labs and imaging studies CBC: Recent Labs  Lab 12/23/23 0329 12/25/23 0425 12/26/23 0822 12/27/23 0503  WBC 7.7 7.2 7.1 8.5  HGB 11.9* 11.7* 11.2* 12.6  HCT 40.7 39.7 37.2 40.8  MCV 82.6 83.2 82.1 81.1  PLT 172 175 190 201   Basic Metabolic Panel: Recent Labs  Lab 12/23/23 0329 12/25/23 0425 12/26/23 0822 12/27/23 0503  NA 138 139 137 136  K 4.5 4.5 4.4 4.6  CL 98 99 101 100  CO2 32 34* 31 31  GLUCOSE 97 95 83 96  BUN 10 16 16 14   CREATININE 0.87 0.98 0.94 1.00  CALCIUM 8.9 9.2 9.2 9.5   GFR: Estimated Creatinine Clearance: 120.3 mL/min (by C-G formula based on SCr of 1 mg/dL). Liver Function Tests: No results for input(s): AST, ALT, ALKPHOS, BILITOT, PROT, ALBUMIN in the last 168 hours.  CBG: No results for input(s): GLUCAP in the last 168 hours.   No results found for this or any previous visit (from the past 240 hours).   Radiology Studies: No results found.   Scheduled Meds:  apixaban   5 mg Oral BID   benztropine   0.5 mg Oral Daily   And   benztropine   1 mg Oral QHS   dextromethorphan -guaiFENesin   1 tablet Oral BID   divalproex   1,000 mg Oral BID   escitalopram   10 mg Oral Daily   ferrous sulfate   325 mg Oral Q breakfast   folic acid   1 mg Oral Daily   levETIRAcetam   500 mg Oral BID   levothyroxine   50 mcg Oral Q0600   LORazepam   0.5 mg Oral QHS   pantoprazole   80 mg Oral Daily   QUEtiapine   12.5 mg Oral QHS   trazodone   75 mg Oral QHS   Xanomeline-Trospium  Chloride  1 capsule Oral BID   Continuous Infusions:   LOS: 12 days  MDM: Patient is high risk for one or more organ failure.  They necessitate ongoing hospitalization for continued IV therapies and subsequent lab monitoring. Total time spent interpreting labs and vitals, reviewing the medical record, coordinating care amongst consultants and care team members,  directly assessing and discussing care with the patient and/or family: 55 min  Christino Mcglinchey, DO [facility]  To contact the attending physician between 7A-7P please use Epic Chat. To contact the covering physician during after hours 7P-7A, please review Amion.  12/29/2023, 1:31 PM   *This document has been created with the assistance of dictation software. Please excuse typographical errors. *   "

## 2023-12-29 NOTE — Plan of Care (Signed)

## 2023-12-29 NOTE — TOC Progression Note (Signed)
 Transition of Care [facility]) - Progression Note    Patient Details  Name: Sherry Bryan MRN: [id number] Date of Birth: Dec 29, 1995  Transition of Care [facility]) CM/SW Contact  Shasta DELENA Daring, RN Phone Number: 12/29/2023, 12:22 PM  Clinical Narrative:     [facility] went to patient's room. Introduced self and role. Patient was lying in bed on her right side. Initially she opened her eyes and smiled at [facility], but then closed her eyes. Did not respond to any questions.  RNCM offered to come back later and patient did not respond.                    Expected Discharge Plan and Services                                               Social Drivers of Health (SDOH) Interventions SDOH Screenings   Food Insecurity: No Food Insecurity (12/19/2023)  Housing: Patient Unable To Answer (12/20/2023)  Transportation Needs: Patient Unable To Answer (12/20/2023)  Utilities: Not At Risk (12/19/2023)  Tobacco Use: Medium Risk (12/16/2023)    Readmission Risk Interventions     No data to display

## 2023-12-29 NOTE — Progress Notes (Signed)
 "    PULMONOLOGY         Date: 12/29/2023,   MRN# [id number] Sherry Bryan [birth year]     AdmissionWeight: (!) 137.9 kg                 CurrentWeight: (!) 141.4 kg  Referring provider: Dr. Franchot   CHIEF COMPLAINT:   Hypercapnic hypoxemic respiratory failure   HISTORY OF PRESENT ILLNESS   This is a 28 year old female with a history of borderline personality disorder, chronic constipation, major depressive disorder, disruptive mood dysregulation disorder, GERD, history of seizure disorder, intellectual disability, pulmonary embolism, morbid obesity posttraumatic stress disorder, suicidal ideation, who was brought in with respiratory distress and a productive cough significant for expectoration of dark phlegm.  On arrival to the emergency department she was hypoxemic requiring 4 L via nasal cannula to reach 89% and was hypotensive with blood pressure less than 100 systolic.  She did have a CT chest obtained on arrival to the ER with ground glass opacification bilaterally but negative for PE.  She is found to be rhinovirus positive.  CT chest with bilateral infiltrates, ABG with hypercapnia and hypoxemia.  She is being evaluated by psychiatry due to self-harm and history of suicidal ideation with multiple psychiatric diagnoses.  PCCM consultation for progressive hypercapnic respiratory failure with hypoxemia potentially needing home BiPAP.  12/25/23- patient is on HFNC resting in bed in no acute distress. She may be seen in pulmonary clinic post dc home.  PCCM will sign off today.  BIPAP has been ordered for home use. She currently is being treated for rhinovirus associated respiratory failure with hypercapnia and hypoxemia.   12/27/23- patient seen at bedside with now weaning of O2 to 8L/min [place].  She is in no distress. I'm repeating ABG today.   12/28/23- patient resting in bed on 7L/min [place].  NO apparent distress.  Have placed orders for BIPAP.  Working with TOC on  obtaining medical supplies for home  12/29/23- patient is with transient hypotension this morning.  I have stopped her metoprolol  today due to low BP.  She is now on only 2L/min and seems to be improved medically. Ive repeated cxr this morning to evaluate interval changes on consolidative opacities from rhinovirus infection   PAST MEDICAL HISTORY   Past Medical History:  Diagnosis Date   Borderline personality disorder (HCC)    Constipation 05/15/2015   Depression    DMDD (disruptive mood dysregulation disorder) 05/15/2015   History of seasonal allergies 05/15/2015   Hx of gastroesophageal reflux (GERD) 05/15/2015   Hx of seizure disorder 05/15/2015   Intellectual disability 05/22/2015   PTSD (post-traumatic stress disorder)    Seizures (HCC)    last one in 2015   Suicidal ideation      SURGICAL HISTORY   History reviewed. No pertinent surgical history.   FAMILY HISTORY   History reviewed. No pertinent family history.   SOCIAL HISTORY   Social History[1]   MEDICATIONS     Current Medication: Current Medications[2]    ALLERGIES   Ritalin [methylphenidate hcl], Abilify [aripiprazole], and Lithium      REVIEW OF SYSTEMS    Review of Systems:  Gen:  Denies  fever, sweats, chills weigh loss  HEENT: Denies blurred vision, double vision, ear pain, eye pain, hearing loss, nose bleeds, sore throat Cardiac:  No dizziness, chest pain or heaviness, chest tightness,edema Resp:   reports dyspnea chronically  Gi: Denies swallowing difficulty, stomach pain, nausea or vomiting, diarrhea, constipation, bowel incontinence  Gu:  Denies bladder incontinence, burning urine Ext:   Denies Joint pain, stiffness or swelling Skin: Denies  skin rash, easy bruising or bleeding or hives Endoc:  Denies polyuria, polydipsia , polyphagia or weight change Psych:   Denies depression, insomnia or hallucinations   Other:  All other systems negative   VS: BP 95/68   Pulse 97   Temp 98.4 F  (36.9 C) (Oral)   Resp 20   Ht 5' 4 (1.626 m)   Wt (!) 141.4 kg   SpO2 98%   BMI 53.51 kg/m      PHYSICAL EXAM    GENERAL:NAD, no fevers, chills, no weakness no fatigue HEAD: Normocephalic, atraumatic.  EYES: Pupils equal, round, reactive to light. Extraocular muscles intact. No scleral icterus.  MOUTH: Moist mucosal membrane. Dentition intact. No abscess noted.  EAR, NOSE, THROAT: Clear without exudates. No external lesions.  NECK: Supple. No thyromegaly. No nodules. No JVD.  PULMONARY: decreased breath sounds with mild rhonchi worse at bases bilaterally.  CARDIOVASCULAR: S1 and S2. Regular rate and rhythm. No murmurs, rubs, or gallops. No edema. Pedal pulses 2+ bilaterally.  GASTROINTESTINAL: Soft, nontender, nondistended. No masses. Positive bowel sounds. No hepatosplenomegaly.  MUSCULOSKELETAL: No swelling, clubbing, or edema. Range of motion full in all extremities.  NEUROLOGIC: Cranial nerves II through XII are intact. No gross focal neurological deficits. Sensation intact. Reflexes intact.  SKIN: No ulceration, lesions, rashes, or cyanosis. Skin warm and dry. Turgor intact.  PSYCHIATRIC: Mood, affect within normal limits. The patient is awake, alert and oriented x 3. Insight, judgment intact.       IMAGING    CT Head Wo Contrast Final result 12/17/2023 10:52 AM        EXAM: CT HEAD WITHOUT CONTRAST 12/17/2023 10:52:44 AM  TECHNIQUE: CT of the head was performed without the administration of intravenous contrast. Automated exposure control, iterative reconstruction, and/or weight based adjustment of the mA/kV was utilized to reduce the radiation dose to as low as reasonably achievable.  ...        Study Result  Narrative & Impression  EXAM: CTA of the Chest without and with contrast for PE 12/17/2023 10:52:44 AM   TECHNIQUE: CTA of the chest was performed without and with the administration of 75 mL of iohexol  (OMNIPAQUE ) 350 MG/ML intravenous contrast.  Multiplanar reformatted images are provided for review. MIP images are provided for review. Automated exposure control, iterative reconstruction, and/or weight based adjustment of the mA/kV was utilized to reduce the radiation dose to as low as reasonably achievable.   COMPARISON: None available.   CLINICAL HISTORY: Pulmonary embolism (PE) suspected, high prob.   FINDINGS:   PULMONARY ARTERIES: Pulmonary arteries are adequately opacified for evaluation. No pulmonary embolism. Main pulmonary artery is normal in caliber.   MEDIASTINUM: The heart and pericardium demonstrate no acute abnormality. There is no acute abnormality of the thoracic aorta.   LYMPH NODES: No mediastinal, hilar or axillary lymphadenopathy.   LUNGS AND PLEURA: There are ground glass and streaky opacities present within the lower lobes bilaterally. The nondependent lungs are relatively clear. No pleural effusion or pneumothorax.   UPPER ABDOMEN: Limited images of the upper abdomen are unremarkable.   SOFT TISSUES AND BONES: No acute bone or soft tissue abnormality.   IMPRESSION: 1. No pulmonary embolism. 2. Ground-glass and streaky opacities in the lower lobes bilaterally, which may reflect atelectasis, aspiration, or atypical/viral pneumonia; correlate clinically.   Electronically signed by: Evalene Coho MD 12/17/2023 11:00 AM EST RP Workstation:  [id number]      ASSESSMENT/PLAN   Hypercapnic hypoxemic respiratory failure    Patient qualifies for BIPAP and likely needs it long term due to morbid obesity and OSA overlap  -have placed BIPAP for home use - rhinovirus is most likely cause of ground glass opacities on CT chest but this would not explain the hypercapnic failure - I can follow up with her on outpatient to check settings and make adjustments -PCO2 >55 on ABG Repeating ABG today and CXR today  -O2 weaned to 2L/min [place]       Thank you for allowing me to participate in the care  of this patient.   Patient/Family are satisfied with care plan and all questions have been answered.    Provider disclosure: Patient with at least one acute or chronic illness or injury that poses a threat to life or bodily function and is being managed actively during this encounter.  All of the below services have been performed independently by signing provider:  review of prior documentation from internal and or external health records.  Review of previous and current lab results.  Interview and comprehensive assessment during patient visit today. Review of current and previous chest radiographs/CT scans. Discussion of management and test interpretation with health care team and patient/family.   This document was prepared using Dragon voice recognition software and may include unintentional dictation errors.     Richetta Cubillos, M.D.  Division of Pulmonary & Critical Care Medicine                 [1]  Social History Tobacco Use   Smoking status: Former    Types: Cigarettes   Smokeless tobacco: Never  Vaping Use   Vaping status: Every Day  Substance Use Topics   Alcohol use: No   Drug use: No  [2]  Current Facility-Administered Medications:    acetaminophen  (TYLENOL ) tablet 650 mg, 650 mg, Oral, Q6H PRN, 650 mg at 12/28/23 2232 **OR** acetaminophen  (TYLENOL ) suppository 650 mg, 650 mg, Rectal, Q6H PRN, Foust, Katy L, NP   albuterol  (PROVENTIL ) (2.5 MG/3ML) 0.083% nebulizer solution 2.5 mg, 2.5 mg, Nebulization, Q2H PRN, Foust, Katy L, NP, 2.5 mg at 12/24/23 2040   apixaban  (ELIQUIS ) tablet 5 mg, 5 mg, Oral, BID, Jessup, Charles, MD, 5 mg at 12/29/23 9046   benztropine  (COGENTIN ) tablet 0.5 mg, 0.5 mg, Oral, Daily, 0.5 mg at 12/28/23 0829 **AND** benztropine  (COGENTIN ) tablet 1 mg, 1 mg, Oral, QHS, Franchot Novel, MD, 1 mg at 12/28/23 2236   dextromethorphan -guaiFENesin  (MUCINEX  DM) 30-600 MG per 12 hr tablet 1 tablet, 1 tablet, Oral, BID, Franchot Novel, MD, 1  tablet at 12/29/23 9046   divalproex  (DEPAKOTE ) DR tablet 1,000 mg, 1,000 mg, Oral, BID, Jessup, Charles, MD, 1,000 mg at 12/29/23 9046   escitalopram  (LEXAPRO ) tablet 10 mg, 10 mg, Oral, Daily, Jessup, Charles, MD, 10 mg at 12/29/23 9046   ferrous sulfate  tablet 325 mg, 325 mg, Oral, Q breakfast, Franchot Novel, MD, 325 mg at 12/29/23 9044   folic acid  (FOLVITE ) tablet 1 mg, 1 mg, Oral, Daily, Jessup, Charles, MD, 1 mg at 12/29/23 9045   levETIRAcetam  (KEPPRA ) tablet 500 mg, 500 mg, Oral, BID, Jessup, Charles, MD, 500 mg at 12/29/23 0955   levothyroxine  (SYNTHROID ) tablet 50 mcg, 50 mcg, Oral, Q0600, Dail Rankin RAMAN, RPH, 50 mcg at 12/29/23 9354   LORazepam  (ATIVAN ) tablet 0.5 mg, 0.5 mg, Oral, QHS, Franchot Novel, MD, 0.5 mg at 12/28/23 2232   ondansetron  (ZOFRAN ) tablet 4 mg,  4 mg, Oral, Q6H PRN, 4 mg at 12/22/23 1124 **OR** ondansetron  (ZOFRAN ) injection 4 mg, 4 mg, Intravenous, Q6H PRN, Foust, Katy L, NP, 4 mg at 12/28/23 1800   pantoprazole  (PROTONIX ) EC tablet 80 mg, 80 mg, Oral, Daily, Jessup, Charles, MD, 80 mg at 12/29/23 9044   QUEtiapine  (SEROQUEL ) tablet 12.5 mg, 12.5 mg, Oral, QHS, Franchot Novel, MD, 12.5 mg at 12/28/23 2232   traZODone  (DESYREL ) tablet 75 mg, 75 mg, Oral, QHS, Franchot Novel, MD, 75 mg at 12/28/23 2232   Xanomeline-Trospium  Chloride 125-30 MG CAPS 1 capsule, 1 capsule, Oral, BID, Greenwood, Howard F, RPH, 125 mg at 12/29/23 9044  "

## 2023-12-30 DIAGNOSIS — B348 Other viral infections of unspecified site: Secondary | ICD-10-CM | POA: Diagnosis not present

## 2023-12-30 LAB — MAGNESIUM: Magnesium: 2.1 mg/dL (ref 1.7–2.4)

## 2023-12-30 LAB — COMPREHENSIVE METABOLIC PANEL WITH GFR
ALT: 16 U/L (ref 0–44)
AST: 14 U/L — ABNORMAL LOW (ref 15–41)
Albumin: 3 g/dL — ABNORMAL LOW (ref 3.5–5.0)
Alkaline Phosphatase: 45 U/L (ref 38–126)
Anion gap: 7 (ref 5–15)
BUN: 15 mg/dL (ref 6–20)
CO2: 28 mmol/L (ref 22–32)
Calcium: 8.7 mg/dL — ABNORMAL LOW (ref 8.9–10.3)
Chloride: 103 mmol/L (ref 98–111)
Creatinine, Ser: 0.91 mg/dL (ref 0.44–1.00)
GFR, Estimated: >60 mL/min
Glucose, Bld: 94 mg/dL (ref 70–99)
Potassium: 4.5 mmol/L (ref 3.5–5.1)
Sodium: 137 mmol/L (ref 135–145)
Total Bilirubin: 0.2 mg/dL (ref 0.0–1.2)
Total Protein: 6.1 g/dL — ABNORMAL LOW (ref 6.5–8.1)

## 2023-12-30 LAB — CBC
HCT: 38.1 % (ref 36.0–46.0)
Hemoglobin: 11.4 g/dL — ABNORMAL LOW (ref 12.0–15.0)
MCH: 25.2 pg — ABNORMAL LOW (ref 26.0–34.0)
MCHC: 29.9 g/dL — ABNORMAL LOW (ref 30.0–36.0)
MCV: 84.1 fL (ref 80.0–100.0)
Platelets: 173 K/uL (ref 150–400)
RBC: 4.53 MIL/uL (ref 3.87–5.11)
RDW: 23.1 % — ABNORMAL HIGH (ref 11.5–15.5)
WBC: 8.2 K/uL (ref 4.0–10.5)
nRBC: 0 % (ref 0.0–0.2)

## 2023-12-30 LAB — PHOSPHORUS: Phosphorus: 3.9 mg/dL (ref 2.5–4.6)

## 2023-12-30 NOTE — Plan of Care (Signed)
 Vital signs remained stable. 1L [place]. Patient is awaiting SNF placement. No other pertinent events this shift. Problem: Education: Goal: Knowledge of General Education information will improve Description: Including pain rating scale, medication(s)/side effects and non-pharmacologic comfort measures Outcome: Progressing   Problem: Health Behavior/Discharge Planning: Goal: Ability to manage health-related needs will improve Outcome: Progressing   Problem: Clinical Measurements: Goal: Ability to maintain clinical measurements within normal limits will improve Outcome: Progressing Goal: Will remain free from infection Outcome: Progressing Goal: Diagnostic test results will improve Outcome: Progressing Goal: Respiratory complications will improve Outcome: Progressing Goal: Cardiovascular complication will be avoided Outcome: Progressing   Problem: Activity: Goal: Risk for activity intolerance will decrease Outcome: Progressing   Problem: Nutrition: Goal: Adequate nutrition will be maintained Outcome: Progressing   Problem: Coping: Goal: Level of anxiety will decrease Outcome: Progressing   Problem: Elimination: Goal: Will not experience complications related to bowel motility Outcome: Progressing Goal: Will not experience complications related to urinary retention Outcome: Progressing   Problem: Pain Managment: Goal: General experience of comfort will improve and/or be controlled Outcome: Progressing   Problem: Safety: Goal: Ability to remain free from injury will improve Outcome: Progressing   Problem: Skin Integrity: Goal: Risk for impaired skin integrity will decrease Outcome: Progressing   Problem: Activity: Goal: Ability to tolerate increased activity will improve Outcome: Progressing   Problem: Clinical Measurements: Goal: Ability to maintain a body temperature in the normal range will improve Outcome: Progressing   Problem: Respiratory: Goal: Ability to  maintain adequate ventilation will improve Outcome: Progressing Goal: Ability to maintain a clear airway will improve Outcome: Progressing

## 2023-12-30 NOTE — Progress Notes (Signed)
 " PROGRESS NOTE    Sherry Bryan  FMW:[id number] DOB: 09/10/1995 DOA: 12/16/2023 PCP: Pcp, No  Chief Complaint  Patient presents with   Suicidal    Hospital Course:  Sherry Bryan is a 28 y.o. year old female with past medical history of hypertension, hypothyroidism, seizure disorder, GERD, prior blood clots on Eliquis , major depressive disorder, DMDD, intellectual disability, borderline personality disorder, PTSD, and adjustment disorder.  She presented to [facility] regional ED yesterday with reports of suicidal ideation and productive cough.  Ultimately she was medically cleared and she remained in the ED voluntary status pending psychiatric evaluation.  This morning she reports that she has had a productive cough with green sputum, dyspnea, and chest pain associated with coughing for about 1 week.  She has not had any known sick contacts.  ED Course: This morning patient was noted to be afebrile temp 38.6 C, BP 98/71 and as low as 89/53 with MAP of 63, RR 21, SpO2 89% on room air and improving to 93% on 4 L via nasal cannula. CT head obtained and shows no acute intracranial abnormality with mucosal thickening and air-fluid levels in bilateral maxillary ethmoid sinuses and left sphenoid sinus.  CTA PE obtained and negative for PE, did show ground glass and streaky opacities in the lower lobes bilaterally. Labs notable for leukocytosis WBC 10.9 yesterday on ED arrival however improved to 7.7 this morning, lactic acid mildly elevated at 2.5, blood cultures obtained and in process.  BMP ordered not collected for this morning yesterday BMP grossly normal with mild hyperglycemia with glucose 125 and calcium 8.8.  COVID flu and RSV negative yesterday.  She was given Tylenol , Rocephin , doxycycline , and home meds including antihypertensives.  Patient was evaluated by telepsychiatry while in the ED who found patient to be at moderate risk of self-harm in the current environment and plan for reassessment  this morning for final psychiatric disposition recommendations.  [facility] contacted for admission.   Subjective: No acute events overnight.  Patient reports she started to feel better today.  She is eating breakfast without issue on my arrival  Objective: Vitals:   12/29/23 2100 12/30/23 0014 12/30/23 0410 12/30/23 0845  BP: (!) 106/56 (!) 91/53 105/64 107/64  Pulse: 87 88 92 81  Resp: 20 19 20 18   Temp: (!) 97.4 F (36.3 C) 98.4 F (36.9 C) 98.2 F (36.8 C) 98 F (36.7 C)  TempSrc: Oral  Oral Oral  SpO2: 94% 93% 96% 95%  Weight:      Height:        Intake/Output Summary (Last 24 hours) at 12/30/2023 1422 Last data filed at 12/30/2023 9090 Gross per 24 hour  Intake 360 ml  Output 700 ml  Net -340 ml   Filed Weights   12/16/23 1322 12/24/23 0500  Weight: (!) 137.9 kg (!) 141.4 kg    Examination: General exam: Appears calm and comfortable, NAD  Respiratory system: No work of breathing, symmetric chest wall expansion Cardiovascular system: S1 & S2 heard, RRR.  Gastrointestinal system: Abdomen is obese, soft. Neuro: Alert and oriented. No focal neurological deficits. Psychiatry: Demonstrates appropriate judgement and insight. Mood & affect appropriate for situation.   Assessment & Plan:  Principal Problem:   Rhinovirus infection Active Problems:   Hx of seizure disorder   Hx of gastroesophageal reflux (GERD)   Intellectual disability   Suicidal ideations   Depression   Acute hypoxic respiratory failure (HCC)   Hypothyroidism   Essential hypertension   Morbid obesity (HCC)  Schizoaffective disorder (HCC)   PTSD (post-traumatic stress disorder)    Rhinovirus Superimposed bacterial pneumonia Sepsis ruled out - Patient was admitted hypotensive with lactic acidosis, hypoxia, CT chest with bilateral lower lobe infiltrates concerning for pneumonia, atelectasis, aspiration - RVP positive for rhinovirus/enterovirus.  Procalcitonin less than 0.1 - Blood cultures  negative - Sputum cultures were discontinued - Received 5 days ceftriaxone  azithromycin  - Recent fever white count - Continues to have oxygen requirement - Continues to desat with ambulation.  Not pursuing home oxygen orders as she will discharge directly to SNF instead.  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  ABG revealed compensated pH - Pulmonology was consulted given persistent hypoxia and hypercapnia concerning for OHS - Will require BiPAP at SNF and likely require oxygen at discharge from SNF. - Needs close outpatient follow-up with pulmonology, Dr. Beuford  Suicidal ideations, resolved - Has been evaluated by psych, they are following.  Will continue current psych meds - Patient has endorsed some concerns about her group home, psychiatry is following up on this.  History of seizure disorder - Continue home meds  Hypotension - Blood pressure has been trending down - Received IV fluid bolus yesterday - Fall precautions - Hold all antihypertensives - She is on Depakote  which can cause hypotension.  Though appears that this is a long-term medication for her  Hypertension - At home on metoprolol , HCTZ, clonidine , prazosin .  Blood pressure currently too low to tolerate these medications.  Holding all - Gradually resume as needed  Hypothyroidism - Continue Synthroid   Normocytic anemia Iron  deficiency anemia - Hemoglobin remained stable  History of GERD - Continue Protonix   Schizoaffective disorder - Psych has reduced Cogentin   Intellectual disability - Patient has a legal guardian for all decision-making.  TOC aware  PTSD - On clonidine  and prazosin  at home.  Doing well without for now.  Gradually resume as tolerated  Depression - Continue Lexapro ,  Seroquel , trazodone  - Currently on lower doses due to drowsiness.  Titrate as needed  Body mass index is 53.51 kg/m. Obesity Class III - Outpatient follow up for lifestyle modification and risk factor management   DVT prophylaxis: Eliquis    Code Status: Full Code Disposition:  Pending SNF. Some complexity given she requires approval from gaurdian. TOC working on it. I personally spoke to and updated her legal guardian, [name] 12/17   Consultants:  Treatment Team:  Consulting Physician: Parris Manna, MD Consulting Physician: Loy Mccartt, DO  Procedures:    Antimicrobials:  Anti-infectives (From admission, onward)    Start     Dose/Rate Route Frequency Ordered Stop   12/20/23 1000  azithromycin  (ZITHROMAX ) tablet 500 mg  Status:  Discontinued        500 mg Oral Daily 12/19/23 0944 12/20/23 0947   12/18/23 1000  cefTRIAXone  (ROCEPHIN ) 2 g in sodium chloride  0.9 % 100 mL IVPB        2 g 200 mL/hr over 30 Minutes Intravenous Every 24 hours 12/17/23 1157 12/22/23 1001   12/17/23 1200  azithromycin  (ZITHROMAX ) 500 mg in sodium chloride  0.9 % 250 mL IVPB        500 mg 250 mL/hr over 60 Minutes Intravenous Every 24 hours 12/17/23 1157 12/19/23 1356   12/17/23 1015  cefTRIAXone  (ROCEPHIN ) 1 g in sodium chloride  0.9 % 100 mL  IVPB        1 g 200 mL/hr over 30 Minutes Intravenous  Once 12/17/23 1012 12/17/23 1118   12/17/23 1015  doxycycline  (VIBRAMYCIN ) 100 mg in sodium chloride  0.9 % 250 mL IVPB        100 mg 125 mL/hr over 120 Minutes Intravenous  Once 12/17/23 1012 12/17/23 1341       Data Reviewed: I have personally reviewed following labs and imaging studies CBC: Recent Labs  Lab 12/25/23 0425 12/26/23 0822 12/27/23 0503 12/30/23 0409  WBC 7.2 7.1 8.5 8.2  HGB 11.7* 11.2* 12.6 11.4*  HCT 39.7 37.2 40.8 38.1  MCV 83.2 82.1 81.1 84.1  PLT 175 190 201 173   Basic Metabolic Panel: Recent Labs  Lab 12/25/23 0425 12/26/23 0822 12/27/23 0503  12/30/23 0409  NA 139 137 136 137  K 4.5 4.4 4.6 4.5  CL 99 101 100 103  CO2 34* 31 31 28   GLUCOSE 95 83 96 94  BUN 16 16 14 15   CREATININE 0.98 0.94 1.00 0.91  CALCIUM 9.2 9.2 9.5 8.7*  MG  --   --   --  2.1  PHOS  --   --   --  3.9   GFR: Estimated Creatinine Clearance: 132.2 mL/min (by C-G formula based on SCr of 0.91 mg/dL). Liver Function Tests: Recent Labs  Lab 12/30/23 0409  AST 14*  ALT 16  ALKPHOS 45  BILITOT 0.2  PROT 6.1*  ALBUMIN 3.0*    CBG: No results for input(s): GLUCAP in the last 168 hours.   No results found for this or any previous visit (from the past 240 hours).   Radiology Studies: DG Chest Port 1 View Result Date: 12/29/2023 EXAM: 1 VIEW(S) XRAY OF THE CHEST 12/29/2023 10:33:15 AM COMPARISON: Comparison with 12/27/2023. CLINICAL HISTORY: [id number] Infiltrate of both lungs present on imaging study [id number] Infiltrate of both lungs present on imaging study. FINDINGS: LUNGS AND PLEURA: Child inspiration. Perihilar and basilar infiltrates may represent edema pneumonia. Infiltrates are improved since the prior study. No pleural effusion or pneumothorax. HEART AND MEDIASTINUM: Cardiac enlargement. BONES AND SOFT TISSUES: No acute osseous abnormality. IMPRESSION: 1. Perihilar and basilar infiltrates, improved since the prior study, possibly representing edema or pneumonia. 2. No pleural effusion or pneumothorax. 3. Cardiac enlargement. Electronically signed by: Elsie Gravely MD 12/29/2023 09:10 PM EST RP Workstation: [id number]     Scheduled Meds:  apixaban   5 mg Oral BID   benztropine   0.5 mg Oral Daily   And   benztropine   1 mg Oral QHS   dextromethorphan -guaiFENesin   1 tablet Oral BID   divalproex   1,000 mg Oral BID   escitalopram   10 mg Oral Daily   ferrous sulfate   325 mg Oral Q breakfast   folic acid   1 mg Oral Daily   levETIRAcetam   500 mg Oral BID   levothyroxine   50 mcg Oral Q0600   LORazepam   0.5 mg Oral QHS   pantoprazole   80 mg Oral  Daily   QUEtiapine   12.5 mg Oral QHS   trazodone   75 mg Oral QHS   Xanomeline-Trospium  Chloride  1 capsule Oral BID   Continuous Infusions:   LOS: 13 days  MDM: Patient is high risk for one or more organ failure.  They necessitate ongoing hospitalization for continued IV therapies and subsequent lab monitoring. Total time spent interpreting labs and vitals, reviewing the medical record, coordinating care amongst consultants and care team members, directly assessing and discussing care with the  patient and/or family: 61 min  Lorane Poland, DO [facility]  To contact the attending physician between 7A-7P please use Epic Chat. To contact the covering physician during after hours 7P-7A, please review Amion.  12/30/2023, 2:22 PM   *This document has been created with the assistance of dictation software. Please excuse typographical errors. *   "

## 2023-12-30 NOTE — Plan of Care (Signed)

## 2023-12-30 NOTE — Consult Note (Addendum)
 [facility] Consult Follow Up  Patient Name: .Sherry Bryan  MRN: [id number]  DOB: [birth year]  Consult Order details:  Orders (From admission, onward)     Start     Ordered   12/16/23 1406  IP CONSULT TO PSYCHIATRY       Ordering Provider: Willo Dunnings, MD  Provider:  (Not yet assigned)  Question:  Reason for consult:  Answer:  Medication management   12/16/23 1406   12/16/23 1406  CONSULT TO CALL ACT TEAM       Ordering Provider: Willo Dunnings, MD  Provider:  (Not yet assigned)  Question:  Reason for Consult?  Answer:  SI   12/16/23 1406             Mode of Visit: In person    Psychiatry Consult Evaluation  Service Date: December 30, 2023 LOS:  LOS: 13 days  Chief Complaint I ran off  Primary Psychiatric Diagnoses   Suicidal ideations   Assessment   Sherry Bryan is a 28 y.o. female admitted: Presented to the EDfor 12/16/2023  1:53 PM for suicidal ideation . She carries the psychiatric diagnoses of intellectual disability, borderline personality disorder, and PTSD  and has a past medical history of  blood clots on Eliquis , seizures.  12/30/2023: Patient was seen this morning during psychiatric rounds. Patient is alert and oriented X 4, calm, drowsy, and difficult to engage in conversation. Patient is observed excessively sleepy with her eyes closed and unable to provide meaningful responses to the questions being asked. Patient informed that this provider will attempt to visit her at a later time and her eyes remained closed. Patient will continue to be monitored for unsafe behaviors and be provided safety per hospital protocol.   On assessment today, patient only participated minimally stating she did not feel physically well.  Current exam was limited due to this.  Patient was just seen here yesterday for suicidal ideation.  Patient reported running off from her group home due to not feeling safe.  When asked to elaborate, patient stated well I  miss my mom and dad and its close to the holidays and it always gets worse.  Patient denied that anyone was physically or emotionally harming her at the group home.  As stated in triage note, patient endorsed suicidal thoughts with plan but denied intent. This is a chronic presentation for patient.   Patient denied homicidal ideation.  Patient denied auditory or visual hallucinations as well.  Patient reported being compliant with current medication regimen. Patient is established with outpatient providers.  This provider attempted to contact group home and legal guardian.  No answer at this time, HIPAA compliant voicemail left for legal guardian requesting return call.  Patient will be observed overnight to ensure safety and monitor for any unsafe behaviors and allow for psychiatry team to get into contact with both group home and legal guardian.  12/17/2023: Patient seen on rounds today by psychiatry.  Patient reported still feeling not good when referring to her physical health.  Patient still endorsed suicidal thoughts, but reported today that her plan was to take her medications.  Patient currently resides at a group home and medications are given by group home staff.  Patient does not have current access to her medications as they are given by staff.  Upon further questioning, patient reported she feels as though she always misses her parents more around the holidays and it is hard for her to handle.  Patient denied that she  had ever spoken with a therapist about these concerns.  Patient did report that she would be willing to speak with a therapist about these things if she had one.  Patient reported she felt as though this would help.  Patient denied homicidal ideations.  Patient denied auditory or visual hallucinations as well.  Patient has continued to maintain safe behaviors while in the emergency department.  Per nursing staff and review of chart, patient has displayed no self-harm behaviors.  It  does not appear that patient has required any IM agitation medications while holding in the emergency room. Although the patient endorses chronic suicidal ideation with an identified plan, they currently reside in a structured group home environment and have historically demonstrated the ability to maintain safety within that setting. The support and supervision available in the group home appear sufficient for ongoing community safety.  We will recommend that patient be established with therapy services along with her medication management team that sees her in the group home.  At this time, psychiatry will sign off.  Patient is currently being medically admitted.  Please reach out for any new concerns or questions.  12/26/2023: Psychiatry was reconsulted by the hospital team due to concerns regarding polypharmacy and increased lethargy in the patient. On today's examination, the patient was alert and oriented x4 but was observed to fall asleep intermittently during the assessment, requiring redirection to continue the interview. When awake, she provided appropriate responses to questions. The patient denied experiencing excessive sleepiness or daytime drowsiness prior to her current medical illness and reported no recent medication adjustments, though she is noted to be a poor historian and does have a legal guardian. Review of her medication regimen reveals she is prescribed Lexapro  10 mg daily, Cogentin  1 mg twice daily, Seroquel  XR, trazodone , Uzedy  LAI and Cobenfy .  Reference medical team note for other medications that have been held for medical indications including clonidine , prazosin  and decreasing daily Ativan  dose.  The medical team has already initiated downward titration of her Seroquel  from baseline dose (50 mg nightly) to 12.5 mg nightly and trazodone  to 75 mg nightly (originally 300 mg nightly) given concerns for sedation. The patient currently denies suicidal and homicidal ideations as well as  auditory or visual hallucinations. On current presentation there was no evidence of psychosis and patient did not appear to be responding to internal stimuli.When asked about previous movement disorders or indications for her current Cogentin  therapy, the patient was unable to provide reliable history. No extrapyramidal symptoms were noted on today's examination, though assessment is limited given her acute medical illness and current need for high-flow nasal cannula. This provider discussed with the medical team that Cogentin  may be contributing to the patient's increased lethargy and recommended considering downward titration in 0.5 mg increments to assess for improvement in alertness. It was also noted that the patient's current acute medical status could be contributing to her lethargic presentation as well. The patient reports she is followed by [facility] for outpatient psychiatric care. Psychiatry will remain available for medication management consultation as needed during her medical admission. The patient does not require acute psychiatric inpatient admission at this time.While future psychiatric events cannot be accurately predicted, the patient does not currently require acute inpatient psychiatric care and does not currently meet [place]  involuntary commitment criteria.   12/18: Patient was rounded on today by psychiatry.  Patient reported doing a little better.  Patient today endorsed suicidal ideation without plan or intent, consistent with chronic baseline  presentation. When asked to elaborate on current stressors, patient reports significant dissatisfaction with current group home placement, describing staff as terrible, rude, and disrespectful. Patient identifies this as major contributor to distress. Patient has resided at current group home for approximately one year.  Of note, patient has historically maintained safety in the community under group home supervision despite  chronic baseline suicidal ideation. Patient has demonstrated safe behaviors throughout current hospitalization.  Patient denies homicidal ideation. When initially asked about hallucinations, patient reported seeing her real mom,however, upon clarification, patient describes these experiences occur during sleep and are dream-like in nature rather than true perceptual disturbances. Patient presenting with chronic suicidal ideation without acute change in safety risk. Current distress appears situational, related to dissatisfaction with group home environment. Patient has demonstrated capacity to maintain safety in supervised community setting and continues to display safe behaviors on unit. No evidence of acute psychosis or perceptual disturbances on current assessment.     Diagnoses:  Active Hospital problems: Principal Problem:   Rhinovirus infection Active Problems:   Hx of seizure disorder   Hx of gastroesophageal reflux (GERD)   Intellectual disability   Suicidal ideations   Depression   Acute hypoxic respiratory failure (HCC)   Hypothyroidism   Essential hypertension   Morbid obesity (HCC)   Schizoaffective disorder (HCC)   PTSD (post-traumatic stress disorder)    Plan   ## Psychiatric Medication Recommendations:  See above under assessment portion of note  ## Medical Decision Making Capacity: Patient has a guardian and has thus been adjudicated incompetent; please involve patients guardian in medical decision making  ## Further Work-up:   -- most recent EKG on 12/16/2023 had QtC of 444    ## Disposition:--Patient is medically admitted  ## Behavioral / Environmental: -Utilize compassion and acknowledge the patient's experiences while setting clear and realistic expectations for care.    ## Safety and Observation Level:  - Based on my clinical evaluation, I estimate the patient to be at low risk of self harm in the current setting. Patient chronically at an elevated  risk due to history. Unit can continue with protcol safety checks - At this time, we recommend  routine. This decision is based on my review of the chart including patient's history and current presentation, interview of the patient, mental status examination, and consideration of suicide risk including evaluating suicidal ideation, plan, intent, suicidal or self-harm behaviors, risk factors, and protective factors. This judgment is based on our ability to directly address suicide risk, implement suicide prevention strategies, and develop a safety plan while the patient is in the clinical setting. Please contact our team if there is a concern that risk level has changed.   Suicide Risk Assessment: Patient has following modifiable risk factors for suicide: N/A which we are addressing by utilizing therapeutic communication to give patient safe space to discuss current emotional status. Patient has following non-modifiable or demographic risk factors for suicide: psychiatric hospitalization Patient has the following protective factors against suicide: Access to outpatient mental health care  Thank you for this consult request. Recommendations have been communicated to the primary team.  We will be available as needed at this time.   Sherry Mountain, NP        History of Present Illness  Relevant Aspects of [facility]   Patient Report:  Collected on initial interview with patient on initial presentation to hospital: On assessment today, patient only participated minimally stating she did not feel physically well.  Current exam was limited due  to this.  Patient was just seen here yesterday for suicidal ideation.  Patient reported running off from her group home due to not feeling safe.  When asked to elaborate, patient stated well I miss my mom and dad and its close to the holidays and it always gets worse.  Patient denied that anyone was physically or emotionally harming her at the group  home.  As stated in triage note, patient endorsed suicidal thoughts with plan but denied intent. This is a chronic presentation for patient.   Patient denied homicidal ideation.  Patient denied auditory or visual hallucinations as well.  Patient reported being compliant with current medication regimen. Patient is established with outpatient providers.  This provider attempted to contact group home and legal guardian.  No answer at this time, HIPAA compliant voicemail left for legal guardian requesting return call.  Patient will be observed overnight to ensure safety and monitor for any unsafe behaviors and allow for psychiatry team to get into contact with both group home and legal guardian.   See updated information under assessment portion of note  Psych ROS:  Depression: Denied Anxiety:  Denied Mania (lifetime and current): Denied Psychosis: (lifetime and current): Denied  Collateral information:  Attempted to contact legal guardian and group home-no answer at this time.      Psychiatric and Social History  Psychiatric History:  Information collected from Patient/chart review  Prev Dx/Sx: Bipolar disorder, Major depressive disorder, and Generalized anxiety disorder , IDD Current Psych Provider: [facility]  Home Meds (current): patient unsure Previous Med Trials: Invega, Haldol, Zyprexa , Abilify, Vraylar, Rexulti per patient report  Therapy: Unknown  Prior Psych Hospitalization: Yes  Prior Self Harm: Yes Prior Violence: Yes  Family Psych History: Patient reports family history of bipolar disorder, ADHD, ODD, and substance abuse in her mother  Family Hx suicide: unknown   Educational Hx: 11th grade Occupational Hx: Unemployed, receives disability Legal Hx: Unknown Living Situation: Group Home Access to weapons/lethal means: Denies   Substance History Alcohol: Denies Tobacco: Patient reports smoking 2 cigarettes/day and vaping Illicit drugs: Denies Prescription drug  abuse: Denies Rehab hx: Denies  Exam Findings  Physical Exam: Reviewed and agree with the physical exam findings conducted by the medical provider Vital Signs:  Temp:  [97.4 F (36.3 C)-98.8 F (37.1 C)] 98 F (36.7 C) (12/20 0845) Pulse Rate:  [81-102] 81 (12/20 0845) Resp:  [18-20] 18 (12/20 0845) BP: (91-107)/(53-64) 107/64 (12/20 0845) SpO2:  [93 %-99 %] 95 % (12/20 0845) Blood pressure 107/64, pulse 81, temperature 98 F (36.7 C), temperature source Oral, resp. rate 18, height 5' 4 (1.626 m), weight (!) 141.4 kg, SpO2 95%. Body mass index is 53.51 kg/m.    Mental Status Exam: General Appearance: Fairly Groomed  Orientation:  Full (Time, Place, and Person)  Memory:  Fair  Concentration:  Fair  Recall:  Fair  Attention  Fair  Eye Contact:  Fair  Speech:  Slow  Language:  Fair  Volume:  Decreased  Mood: Okay  Affect:  Appropriate  Thought Process:  Coherent  Thought Content:  Logical  Suicidal Thoughts:  Passive S/I reported today  Homicidal Thoughts:  No  Judgement:  Poor  Insight:  Lacking  Psychomotor Activity:  Decreased  Akathisia:  No  Fund of Knowledge:  Fair      Assets:  Therapist, Sports  Cognition:  Impaired,  Moderate  ADL's: Impaired  AIMS (if indicated):        Other  History   These have been pulled in through the EMR, reviewed, and updated if appropriate.  Family History:  The patient's family history is not on file.  Medical History: Past Medical History:  Diagnosis Date   Borderline personality disorder (HCC)    Constipation 05/15/2015   Depression    DMDD (disruptive mood dysregulation disorder) 05/15/2015   History of seasonal allergies 05/15/2015   Hx of gastroesophageal reflux (GERD) 05/15/2015   Hx of seizure disorder 05/15/2015   Intellectual disability 05/22/2015   PTSD (post-traumatic stress disorder)    Seizures (HCC)    last one in 2015   Suicidal ideation     Surgical  History: History reviewed. No pertinent surgical history.   Medications:   Current Facility-Administered Medications:    acetaminophen  (TYLENOL ) tablet 650 mg, 650 mg, Oral, Q6H PRN, 650 mg at 12/29/23 2309 **OR** acetaminophen  (TYLENOL ) suppository 650 mg, 650 mg, Rectal, Q6H PRN, Foust, Katy L, NP   albuterol  (PROVENTIL ) (2.5 MG/3ML) 0.083% nebulizer solution 2.5 mg, 2.5 mg, Nebulization, Q2H PRN, Foust, Katy L, NP, 2.5 mg at 12/24/23 2040   apixaban  (ELIQUIS ) tablet 5 mg, 5 mg, Oral, BID, Jessup, Charles, MD, 5 mg at 12/30/23 0915   benztropine  (COGENTIN ) tablet 0.5 mg, 0.5 mg, Oral, Daily, 0.5 mg at 12/30/23 0916 **AND** benztropine  (COGENTIN ) tablet 1 mg, 1 mg, Oral, QHS, Franchot Novel, MD, 1 mg at 12/29/23 2132   dextromethorphan -guaiFENesin  (MUCINEX  DM) 30-600 MG per 12 hr tablet 1 tablet, 1 tablet, Oral, BID, Franchot Novel, MD, 1 tablet at 12/30/23 9082   divalproex  (DEPAKOTE ) DR tablet 1,000 mg, 1,000 mg, Oral, BID, Jessup, Charles, MD, 1,000 mg at 12/30/23 9082   escitalopram  (LEXAPRO ) tablet 10 mg, 10 mg, Oral, Daily, Jessup, Charles, MD, 10 mg at 12/30/23 9084   ferrous sulfate  tablet 325 mg, 325 mg, Oral, Q breakfast, Franchot Novel, MD, 325 mg at 12/30/23 0915   folic acid  (FOLVITE ) tablet 1 mg, 1 mg, Oral, Daily, Jessup, Charles, MD, 1 mg at 12/30/23 0915   levETIRAcetam  (KEPPRA ) tablet 500 mg, 500 mg, Oral, BID, Jessup, Charles, MD, 500 mg at 12/30/23 0915   levothyroxine  (SYNTHROID ) tablet 50 mcg, 50 mcg, Oral, Q0600, Dail Rankin RAMAN, RPH, 50 mcg at 12/29/23 9354   LORazepam  (ATIVAN ) tablet 0.5 mg, 0.5 mg, Oral, QHS, Franchot Novel, MD, 0.5 mg at 12/29/23 2132   ondansetron  (ZOFRAN ) tablet 4 mg, 4 mg, Oral, Q6H PRN, 4 mg at 12/30/23 0948 **OR** ondansetron  (ZOFRAN ) injection 4 mg, 4 mg, Intravenous, Q6H PRN, Foust, Katy L, NP, 4 mg at 12/28/23 1800   pantoprazole  (PROTONIX ) EC tablet 80 mg, 80 mg, Oral, Daily, Jessup, Charles, MD, 80 mg at 12/30/23 9084    QUEtiapine  (SEROQUEL ) tablet 12.5 mg, 12.5 mg, Oral, QHS, Franchot Novel, MD, 12.5 mg at 12/29/23 2131   traZODone  (DESYREL ) tablet 75 mg, 75 mg, Oral, QHS, Franchot Novel, MD, 75 mg at 12/29/23 2131   Xanomeline-Trospium  Chloride 125-30 MG CAPS 1 capsule, 1 capsule, Oral, BID, Niels Kayla FALCON, [facility], 1 capsule at 12/30/23 9083  Allergies: Allergies  Allergen Reactions   Ritalin [Methylphenidate Hcl] Other (See Comments)    seizures   Abilify [Aripiprazole] Other (See Comments)    Shaking or tremors   Lithium      Sherry Mountain, NP Case was discussed with supervising physician Dr. Mountain who is agreeable with current plan.

## 2023-12-31 DIAGNOSIS — B348 Other viral infections of unspecified site: Secondary | ICD-10-CM | POA: Diagnosis not present

## 2023-12-31 NOTE — Plan of Care (Signed)
 Patients vital signs remained stable. 1L [place]. Patient worked with OT. Patient awaiting placement at facility. Transfer orders placed.  Problem: Education: Goal: Knowledge of General Education information will improve Description: Including pain rating scale, medication(s)/side effects and non-pharmacologic comfort measures Outcome: Progressing   Problem: Health Behavior/Discharge Planning: Goal: Ability to manage health-related needs will improve Outcome: Progressing   Problem: Clinical Measurements: Goal: Ability to maintain clinical measurements within normal limits will improve Outcome: Progressing Goal: Will remain free from infection Outcome: Progressing Goal: Diagnostic test results will improve Outcome: Progressing Goal: Respiratory complications will improve Outcome: Progressing Goal: Cardiovascular complication will be avoided Outcome: Progressing   Problem: Activity: Goal: Risk for activity intolerance will decrease Outcome: Progressing   Problem: Nutrition: Goal: Adequate nutrition will be maintained Outcome: Progressing   Problem: Coping: Goal: Level of anxiety will decrease Outcome: Progressing   Problem: Elimination: Goal: Will not experience complications related to bowel motility Outcome: Progressing Goal: Will not experience complications related to urinary retention Outcome: Progressing   Problem: Pain Managment: Goal: General experience of comfort will improve and/or be controlled Outcome: Progressing   Problem: Safety: Goal: Ability to remain free from injury will improve Outcome: Progressing   Problem: Skin Integrity: Goal: Risk for impaired skin integrity will decrease Outcome: Progressing   Problem: Activity: Goal: Ability to tolerate increased activity will improve Outcome: Progressing   Problem: Clinical Measurements: Goal: Ability to maintain a body temperature in the normal range will improve Outcome: Progressing   Problem:  Respiratory: Goal: Ability to maintain adequate ventilation will improve Outcome: Progressing Goal: Ability to maintain a clear airway will improve Outcome: Progressing

## 2023-12-31 NOTE — Progress Notes (Signed)
 PT Cancellation Note  Patient Details Name: Sherry Bryan MRN: [id number] DOB: [birth year]   Cancelled Treatment:    Reason Eval/Treat Not Completed: Fatigue/lethargy limiting ability to participate  Pt sleeping soundly.  Does not awake to voice and light touch.  Snoring.  Will return at a later time/date.   Lauraine Gills 12/31/2023, 1:21 PM

## 2023-12-31 NOTE — Progress Notes (Signed)
 "    Progress Note    Sherry Bryan  FMW:[id number] DOB: 08-Sep-1995  DOA: 12/16/2023 PCP: Pcp, No      Brief Narrative:    Medical records reviewed and are as summarized below:  Sherry Bryan is a 28 y.o. female with past medical history of hypertension, hypothyroidism, seizure disorder, GERD, prior blood clots on Eliquis , major depressive disorder, DMDD, intellectual disability, borderline personality disorder, PTSD, and adjustment disorder.  She presented to [facility] 12/14/2023 with reports of suicidal ideation and productive cough.  Ultimately she was medically cleared and she remained in the ED under involuntary commitment pending psychiatric evaluation.  On the morning of 12/17/2023, she reported cough productive of greenish sputum, shortness of breath and chest pain.  She said she had been coughing for about a week.  She has not had any known sick contacts.  ED Course: Vitals on 12/17/2023-temp 38.6 C, BP 98/71 and as low as 89/53 with MAP of 63, RR 21, SpO2 89% on room air and improving to 93% on 4 L via nasal cannula. CT head obtained and shows no acute intracranial abnormality with mucosal thickening and air-fluid levels in bilateral maxillary ethmoid sinuses and left sphenoid sinus.  CTA PE obtained and negative for PE, did show ground glass and streaky opacities in the lower lobes bilaterally.   TRH was contacted for admission.       Assessment/Plan:   Principal Problem:   Rhinovirus infection Active Problems:   Hx of seizure disorder   Hx of gastroesophageal reflux (GERD)   Intellectual disability   Suicidal ideations   Depression   Acute hypoxic respiratory failure (HCC)   Hypothyroidism   Essential hypertension   Morbid obesity (HCC)   Schizoaffective disorder (HCC)   PTSD (post-traumatic stress disorder)    Body mass index is 53.51 kg/m.  (Class III obesity)   Rhinovirus infection, superimposed bacterial pneumonia: Completed 5 days of  ceftriaxone  and azithromycin . Respiratory viral panel positive for rhinovirus.   Acute on chronic hypoxic respiratory failure with hypercapnia, probable OHS: Unable to wean off oxygen.  Continue 2 L/min oxygen via [facility].  Continue BiPAP at night.   Hypotension: Clonidine , HCTZ, Toprol -XL and prazosin  on hold   Schizoaffective disorder, PTSD, depression with recent suicidal thoughts: Continue psychotropics. Clonidine  and prazosin  for PTSD on hold because of hypotension   Seizure disorder: Continue Depakote  and Keppra    Comorbidities include intellectual disability, hypothyroidism, GERD, iron  deficiency anemia,   Diet Order             Diet Heart Room service appropriate? Yes; Fluid consistency: Thin  Diet effective now                                  Consultants: Psychiatrist Pulmonologist  Procedures: None    Medications:    apixaban   5 mg Oral BID   benztropine   0.5 mg Oral Daily   And   benztropine   1 mg Oral QHS   dextromethorphan -guaiFENesin   1 tablet Oral BID   divalproex   1,000 mg Oral BID   escitalopram   10 mg Oral Daily   ferrous sulfate   325 mg Oral Q breakfast   folic acid   1 mg Oral Daily   levETIRAcetam   500 mg Oral BID   levothyroxine   50 mcg Oral Q0600   LORazepam   0.5 mg Oral QHS   pantoprazole   80 mg Oral Daily   QUEtiapine   12.5  mg Oral QHS   trazodone   75 mg Oral QHS   Xanomeline-Trospium  Chloride  1 capsule Oral BID   Continuous Infusions:   Anti-infectives (From admission, onward)    Start     Dose/Rate Route Frequency Ordered Stop   12/20/23 1000  azithromycin  (ZITHROMAX ) tablet 500 mg  Status:  Discontinued        500 mg Oral Daily 12/19/23 0944 12/20/23 0947   12/18/23 1000  cefTRIAXone  (ROCEPHIN ) 2 g in sodium chloride  0.9 % 100 mL IVPB        2 g 200 mL/hr over 30 Minutes Intravenous Every 24 hours 12/17/23 1157 12/22/23 1001   12/17/23 1200  azithromycin  (ZITHROMAX ) 500 mg in sodium chloride  0.9 % 250 mL  IVPB        500 mg 250 mL/hr over 60 Minutes Intravenous Every 24 hours 12/17/23 1157 12/19/23 1356   12/17/23 1015  cefTRIAXone  (ROCEPHIN ) 1 g in sodium chloride  0.9 % 100 mL IVPB        1 g 200 mL/hr over 30 Minutes Intravenous  Once 12/17/23 1012 12/17/23 1118   12/17/23 1015  doxycycline  (VIBRAMYCIN ) 100 mg in sodium chloride  0.9 % 250 mL IVPB        100 mg 125 mL/hr over 120 Minutes Intravenous  Once 12/17/23 1012 12/17/23 1341              Family Communication/Anticipated D/C date and plan/Code Status   DVT prophylaxis:  apixaban  (ELIQUIS ) tablet 5 mg     Code Status: Full Code  Family Communication: None Disposition Plan: Plan to discharge to SNF   Status is: Inpatient Remains inpatient appropriate because: Awaiting placement to SNF       Subjective:   Interval events noted.  She has no complaints.  Objective:    Vitals:   12/31/23 0100 12/31/23 0440 12/31/23 0757 12/31/23 1217  BP: 107/67 119/74 95/60 95/60   Pulse: 84 90 80   Resp: 18 18 17 16   Temp:  97.8 F (36.6 C) 97.9 F (36.6 C) 98.2 F (36.8 C)  TempSrc:  Oral    SpO2:  92% 96% 94%  Weight:      Height:       No data found.   Intake/Output Summary (Last 24 hours) at 12/31/2023 1442 Last data filed at 12/31/2023 9092 Gross per 24 hour  Intake 840 ml  Output --  Net 840 ml   Filed Weights   12/16/23 1322 12/24/23 0500  Weight: (!) 137.9 kg (!) 141.4 kg    Exam:  GEN: NAD SKIN: No rash EYES: No pallor or icterus ENT: MMM CV: RRR PULM: CTA B ABD: soft, obese, NT, +BS CNS: AAO x 3, non focal EXT: No edema or tenderness        Data Reviewed:   I have personally reviewed following labs and imaging studies:  Labs: Labs show the following:   Basic Metabolic Panel: Recent Labs  Lab 12/25/23 0425 12/26/23 0822 12/27/23 0503 12/30/23 0409  NA 139 137 136 137  K 4.5 4.4 4.6 4.5  CL 99 101 100 103  CO2 34* 31 31 28   GLUCOSE 95 83 96 94  BUN 16 16 14 15    CREATININE 0.98 0.94 1.00 0.91  CALCIUM 9.2 9.2 9.5 8.7*  MG  --   --   --  2.1  PHOS  --   --   --  3.9   GFR Estimated Creatinine Clearance: 132.2 mL/min (by C-G formula based on  SCr of 0.91 mg/dL). Liver Function Tests: Recent Labs  Lab 12/30/23 0409  AST 14*  ALT 16  ALKPHOS 45  BILITOT 0.2  PROT 6.1*  ALBUMIN 3.0*   No results for input(s): LIPASE, AMYLASE in the last 168 hours. No results for input(s): AMMONIA in the last 168 hours. Coagulation profile No results for input(s): INR, PROTIME in the last 168 hours.  CBC: Recent Labs  Lab 12/25/23 0425 12/26/23 0822 12/27/23 0503 12/30/23 0409  WBC 7.2 7.1 8.5 8.2  HGB 11.7* 11.2* 12.6 11.4*  HCT 39.7 37.2 40.8 38.1  MCV 83.2 82.1 81.1 84.1  PLT 175 190 201 173   Cardiac Enzymes: No results for input(s): CKTOTAL, CKMB, CKMBINDEX, TROPONINI in the last 168 hours. BNP (last 3 results) Recent Labs    12/22/23 1221  PROBNP <50.0   CBG: No results for input(s): GLUCAP in the last 168 hours. D-Dimer: No results for input(s): DDIMER in the last 72 hours. Hgb A1c: No results for input(s): HGBA1C in the last 72 hours. Lipid Profile: No results for input(s): CHOL, HDL, LDLCALC, TRIG, CHOLHDL, LDLDIRECT in the last 72 hours. Thyroid function studies: No results for input(s): TSH, T4TOTAL, T3FREE, THYROIDAB in the last 72 hours.  Invalid input(s): FREET3 Anemia work up: No results for input(s): VITAMINB12, FOLATE, FERRITIN, TIBC, IRON , RETICCTPCT in the last 72 hours. Sepsis Labs: Recent Labs  Lab 12/25/23 0425 12/26/23 0822 12/27/23 0503 12/30/23 0409  WBC 7.2 7.1 8.5 8.2    Microbiology No results found for this or any previous visit (from the past 240 hours).  Procedures and diagnostic studies:  No results found.             LOS: 14 days   Madina Galati  [facility] [employer] Loss Adjuster on [email or web address]. If 7PM-7AM, please contact  night-coverage at [email or web address]     12/31/2023, 2:42 PM           "

## 2023-12-31 NOTE — Plan of Care (Signed)
" °  Problem: Clinical Measurements: Goal: Ability to maintain clinical measurements within normal limits will improve Outcome: Progressing   Problem: Clinical Measurements: Goal: Respiratory complications will improve Outcome: Progressing   Problem: Activity: Goal: Risk for activity intolerance will decrease Outcome: Progressing   Problem: Pain Managment: Goal: General experience of comfort will improve and/or be controlled Outcome: Progressing   Problem: Safety: Goal: Ability to remain free from injury will improve Outcome: Progressing   Problem: Respiratory: Goal: Ability to maintain adequate ventilation will improve Outcome: Progressing   "

## 2023-12-31 NOTE — Plan of Care (Signed)

## 2024-01-01 DIAGNOSIS — B348 Other viral infections of unspecified site: Secondary | ICD-10-CM | POA: Diagnosis not present

## 2024-01-01 MED ORDER — MIDODRINE HCL 5 MG PO TABS
5.0000 mg | ORAL_TABLET | Freq: Three times a day (TID) | ORAL | Status: DC
  Administered 2024-01-01 – 2024-01-05 (×11): 5 mg via ORAL
  Filled 2024-01-01 (×11): qty 1

## 2024-01-01 NOTE — TOC PASRR Note (Cosign Needed)
 PASSAR: [id number] F

## 2024-01-01 NOTE — Progress Notes (Signed)
 PT Cancellation Note  Patient Details Name: Sherry Bryan MRN: [id number] DOB: 26-Apr-1995   Cancelled Treatment:     Pt moved from room 244 to 101 earlier, currently declining any activity. Agreed to PT session tomorrow. Will re-attempt in am.   [name] 01/01/2024, 4:30 PM

## 2024-01-01 NOTE — Progress Notes (Signed)
 Occupational Therapy Treatment Patient Details Name: Sherry Bryan MRN: [id number] DOB: 1995/09/22 Today's Date: 01/01/2024   History of present illness Pt is a 28 year old female admitted with sepsis due to pneumonia, acute hypoxic respiratory failure; of note, pt presented to ED with suicidal ideations, Patient currently has no suicidal ideations    PMH significant for  hypertension, hypothyroidism, seizure disorder, GERD, prior blood clots on Eliquis , major depressive disorder, DMDD, intellectual disability, borderline personality disorder, PTSD, and adjustment disorder   OT comments  Patient seen for OT treatment on this date. Upon arrival to room patient asleep in bed without O2 on, O2 85%; min A to transition to EOB, had patient perform PLB, grooming tasks and in room mobility while monitoring O2, with activity her O2 increases to 97% but while resting she is not able to maintain RA. During standing task, patient began to fall asleep and had near fall which was corrected by OT. RN notified. Patient returned to bed, encouraged to sit up in bed to improve diaphragmatic breathing with patient agreeing. Patient ended treatment in bed with bed/chair alarm on and all needs within reach. Patient making good progress toward goals, will continue to follow POC. Discharge recommendation remains appropriate.        If plan is discharge home, recommend the following:  Supervision due to cognitive status;Direct supervision/assist for financial management;Direct supervision/assist for medications management;Assistance with cooking/housework;Help with stairs or ramp for entrance;A lot of help with walking and/or transfers;A lot of help with bathing/dressing/bathroom   Equipment Recommendations  Other (comment) (defer to next venue of care)    Recommendations for Other Services      Precautions / Restrictions Precautions Precautions: Fall Recall of Precautions/Restrictions:  Impaired Precaution/Restrictions Comments: monitor O2 Restrictions Weight Bearing Restrictions Per Provider Order: No       Mobility Bed Mobility Overal bed mobility: Needs Assistance Bed Mobility: Supine to Sit, Sit to Supine     Supine to sit: Min assist Sit to supine: Supervision   General bed mobility comments: required min A to go from sidelying to sitting EOB    Transfers Overall transfer level: Needs assistance Equipment used: None Transfers: Sit to/from Stand Sit to Stand: Supervision           General transfer comment: supervision     Balance Overall balance assessment: Needs assistance Sitting-balance support: Feet supported Sitting balance-Leahy Scale: Good     Standing balance support: During functional activity, No upper extremity supported Standing balance-Leahy Scale: Fair                             ADL either performed or assessed with clinical judgement   ADL Overall ADL's : Needs assistance/impaired     Grooming: Wash/dry hands;Wash/dry face;Supervision/safety;Set up;Standing       Lower Body Bathing: Supervison/ safety;Sitting/lateral leans;Sit to/from stand Lower Body Bathing Details (indicate cue type and reason): stood at bedside to complete and sat EOB to complete         Toilet Transfer: Supervision/safety;Contact guard [employer] Details (indicate cue type and reason): able to transfer on/off standard height commode without AD Toileting- Clothing Manipulation and Hygiene: Supervision/safety;Sit to/from stand Toileting - Clothing Manipulation Details (indicate cue type and reason): completed with supervision, cues for thoroughness with pericare       General ADL Comments: close supervision required as patient was lethargic and began to fall asleep while standing    Extremity/Trunk Assessment Upper  Extremity Assessment Upper Extremity Assessment: Overall WFL for tasks assessed   Lower  Extremity Assessment Lower Extremity Assessment: Defer to PT evaluation        Vision       Perception     Praxis     Communication Communication Communication: Impaired Factors Affecting Communication: Difficulty expressing self   Cognition Arousal: Lethargic Behavior During Therapy: Flat affect Cognition: History of cognitive impairments, Cognition impaired, No family/caregiver present to determine baseline                               Following commands: Impaired Following commands impaired: Follows one step commands with increased time      Cueing   Cueing Techniques: Verbal cues, Tactile cues  Exercises      Shoulder Instructions       General Comments patient lethargic, near fall while standing as she began to fall asleep    Pertinent Vitals/ Pain       Pain Assessment Pain Assessment: No/denies pain  Home Living                                          Prior Functioning/Environment              Frequency  Min 2X/week        Progress Toward Goals  OT Goals(current goals can now be found in the care plan section)  Progress towards OT goals: Progressing toward goals     Plan      Co-evaluation                 AM-PAC OT 6 Clicks Daily Activity     Outcome Measure   Help from another person eating meals?: A Little Help from another person taking care of personal grooming?: A Little Help from another person toileting, which includes using toliet, bedpan, or urinal?: A Lot Help from another person bathing (including washing, rinsing, drying)?: A Lot Help from another person to put on and taking off regular upper body clothing?: A Lot Help from another person to put on and taking off regular lower body clothing?: A Lot 6 Click Score: 14    End of Session    OT Visit Diagnosis: Other abnormalities of gait and mobility (R26.89)   Activity Tolerance Patient limited by lethargy   Patient Left in  bed;with call bell/phone within reach;with bed alarm set   Nurse Communication Other (comment) (very lethargic)        Time: 9170-9154 OT Time Calculation (min): 16 min  Charges: OT General Charges $OT Visit: 1 Visit OT Treatments $Self Care/Home Management : 8-22 mins  Rogers Clause, OT/L MSOT, 01/01/2024

## 2024-01-01 NOTE — Progress Notes (Signed)
 "    Progress Note    Sherry Bryan  FMW:[id number] DOB: October 13, 1995  DOA: 12/16/2023 PCP: Pcp, No      Brief Narrative:    Medical records reviewed and are as summarized below:  Sherry Bryan is a 28 y.o. female with past medical history of hypertension, hypothyroidism, seizure disorder, GERD, prior blood clots on Eliquis , major depressive disorder, DMDD, intellectual disability, borderline personality disorder, PTSD, and adjustment disorder.  She presented to [facility] 12/14/2023 with reports of suicidal ideation and productive cough.  Ultimately she was medically cleared and she remained in the ED pending psychiatric evaluation.  On the morning of 12/17/2023, she reported cough productive of greenish sputum, shortness of breath and chest pain.  She said she had been coughing for about a week.  She has not had any known sick contacts.  ED Course: Vitals on 12/17/2023-temp 38.6 C, BP 98/71 and as low as 89/53 with MAP of 63, RR 21, SpO2 89% on room air and improving to 93% on 4 L via nasal cannula. CT head obtained and shows no acute intracranial abnormality with mucosal thickening and air-fluid levels in bilateral maxillary ethmoid sinuses and left sphenoid sinus.  CTA PE obtained and negative for PE, did show ground glass and streaky opacities in the lower lobes bilaterally.   TRH was contacted for admission.       Assessment/Plan:   Principal Problem:   Rhinovirus infection Active Problems:   Hx of seizure disorder   Hx of gastroesophageal reflux (GERD)   Intellectual disability   Suicidal ideations   Depression   Acute hypoxic respiratory failure (HCC)   Hypothyroidism   Essential hypertension   Morbid obesity (HCC)   Schizoaffective disorder (HCC)   PTSD (post-traumatic stress disorder)    Body mass index is 53.51 kg/m.  (Class III obesity)   Rhinovirus infection, superimposed bacterial pneumonia: Completed 5 days of ceftriaxone  and  azithromycin . Respiratory viral panel positive for rhinovirus.   Acute on chronic hypoxic respiratory failure with hypercapnia, probable OHS: Unable to wean off oxygen.  Continue 2 L/min oxygen via [place].  Patient has not been using BiPAP at night because she says she does not like it.  Encouraged the use of BiPAP nightly to reduce the risk of respiratory complications.   Recurrent hypotension: Start midodrine  Clonidine , HCTZ, Toprol -XL and prazosin  on hold   Schizoaffective disorder, PTSD, depression with recent suicidal thoughts: Continue psychotropics. Clonidine  and prazosin  for PTSD on hold because of hypotension   Seizure disorder: Continue Depakote  and Keppra    Comorbidities include intellectual disability, hypothyroidism, GERD, iron  deficiency anemia,   Diet Order             Diet Heart Room service appropriate? Yes; Fluid consistency: Thin  Diet effective now                                  Consultants: Psychiatrist Pulmonologist  Procedures: None    Medications:    apixaban   5 mg Oral BID   benztropine   0.5 mg Oral Daily   And   benztropine   1 mg Oral QHS   dextromethorphan -guaiFENesin   1 tablet Oral BID   divalproex   1,000 mg Oral BID   escitalopram   10 mg Oral Daily   ferrous sulfate   325 mg Oral Q breakfast   folic acid   1 mg Oral Daily   levETIRAcetam   500 mg Oral BID   levothyroxine   50 mcg Oral Q0600   LORazepam   0.5 mg Oral QHS   pantoprazole   80 mg Oral Daily   QUEtiapine   12.5 mg Oral QHS   trazodone   75 mg Oral QHS   Xanomeline-Trospium  Chloride  1 capsule Oral BID   Continuous Infusions:   Anti-infectives (From admission, onward)    Start     Dose/Rate Route Frequency Ordered Stop   12/20/23 1000  azithromycin  (ZITHROMAX ) tablet 500 mg  Status:  Discontinued        500 mg Oral Daily 12/19/23 0944 12/20/23 0947   12/18/23 1000  cefTRIAXone  (ROCEPHIN ) 2 g in sodium chloride  0.9 % 100 mL IVPB        2 g 200 mL/hr  over 30 Minutes Intravenous Every 24 hours 12/17/23 1157 12/22/23 1001   12/17/23 1200  azithromycin  (ZITHROMAX ) 500 mg in sodium chloride  0.9 % 250 mL IVPB        500 mg 250 mL/hr over 60 Minutes Intravenous Every 24 hours 12/17/23 1157 12/19/23 1356   12/17/23 1015  cefTRIAXone  (ROCEPHIN ) 1 g in sodium chloride  0.9 % 100 mL IVPB        1 g 200 mL/hr over 30 Minutes Intravenous  Once 12/17/23 1012 12/17/23 1118   12/17/23 1015  doxycycline  (VIBRAMYCIN ) 100 mg in sodium chloride  0.9 % 250 mL IVPB        100 mg 125 mL/hr over 120 Minutes Intravenous  Once 12/17/23 1012 12/17/23 1341              Family Communication/Anticipated D/C date and plan/Code Status   DVT prophylaxis:  apixaban  (ELIQUIS ) tablet 5 mg     Code Status: Full Code  Family Communication: None Disposition Plan: Plan to discharge to SNF   Status is: Inpatient Remains inpatient appropriate because: Awaiting placement to SNF       Subjective:   Interval events noted.  No complaints reported.  She said she did not use BiPAP last night because she does not like it.  Objective:    Vitals:   01/01/24 0437 01/01/24 0825 01/01/24 1244 01/01/24 1559  BP: (!) 92/56 (!) 105/58 100/65 (!) 84/30  Pulse: 81 83 92 88  Resp: 20 18 16 17   Temp: 98.6 F (37 C) 98.4 F (36.9 C) 98.6 F (37 C)   TempSrc:  Oral Oral   SpO2: 94% 94% 92% 91%  Weight:      Height:       No data found.  No intake or output data in the 24 hours ending 01/01/24 1724  Filed Weights   12/16/23 1322 12/24/23 0500  Weight: (!) 137.9 kg (!) 141.4 kg    Exam:  GEN: NAD SKIN: Warm and dry EYES: No pallor or icterus ENT: MMM CV: RRR PULM: CTA B ABD: soft, obese, NT, +BS CNS: AAO x 3, non focal EXT: No edema or tenderness         Data Reviewed:   I have personally reviewed following labs and imaging studies:  Labs: Labs show the following:   Basic Metabolic Panel: Recent Labs  Lab 12/26/23 0822  12/27/23 0503 12/30/23 0409  NA 137 136 137  K 4.4 4.6 4.5  CL 101 100 103  CO2 31 31 28   GLUCOSE 83 96 94  BUN 16 14 15   CREATININE 0.94 1.00 0.91  CALCIUM 9.2 9.5 8.7*  MG  --   --  2.1  PHOS  --   --  3.9   GFR  Estimated Creatinine Clearance: 132.2 mL/min (by C-G formula based on SCr of 0.91 mg/dL). Liver Function Tests: Recent Labs  Lab 12/30/23 0409  AST 14*  ALT 16  ALKPHOS 45  BILITOT 0.2  PROT 6.1*  ALBUMIN 3.0*   No results for input(s): LIPASE, AMYLASE in the last 168 hours. No results for input(s): AMMONIA in the last 168 hours. Coagulation profile No results for input(s): INR, PROTIME in the last 168 hours.  CBC: Recent Labs  Lab 12/26/23 0822 12/27/23 0503 12/30/23 0409  WBC 7.1 8.5 8.2  HGB 11.2* 12.6 11.4*  HCT 37.2 40.8 38.1  MCV 82.1 81.1 84.1  PLT 190 201 173   Cardiac Enzymes: No results for input(s): CKTOTAL, CKMB, CKMBINDEX, TROPONINI in the last 168 hours. BNP (last 3 results) Recent Labs    12/22/23 1221  PROBNP <50.0   CBG: No results for input(s): GLUCAP in the last 168 hours. D-Dimer: No results for input(s): DDIMER in the last 72 hours. Hgb A1c: No results for input(s): HGBA1C in the last 72 hours. Lipid Profile: No results for input(s): CHOL, HDL, LDLCALC, TRIG, CHOLHDL, LDLDIRECT in the last 72 hours. Thyroid function studies: No results for input(s): TSH, T4TOTAL, T3FREE, THYROIDAB in the last 72 hours.  Invalid input(s): FREET3 Anemia work up: No results for input(s): VITAMINB12, FOLATE, FERRITIN, TIBC, IRON , RETICCTPCT in the last 72 hours. Sepsis Labs: Recent Labs  Lab 12/26/23 0822 12/27/23 0503 12/30/23 0409  WBC 7.1 8.5 8.2    Microbiology No results found for this or any previous visit (from the past 240 hours).  Procedures and diagnostic studies:  No results found.             LOS: 15 days   Sherry Bryan  [facility] Probation Officer on [email or web address]. If 7PM-7AM, please contact night-coverage at [email or web address]     01/01/2024, 5:24 PM           "

## 2024-01-01 NOTE — TOC Progression Note (Signed)
 Transition of Care [facility]) - Progression Note    Patient Details  Name: Sherry Bryan MRN: [id number] Date of Birth: 10/27/1995  Transition of Care [facility]) CM/SW Contact  Shasta DELENA Daring, RN Phone Number: 01/01/2024, 1:08 PM  Clinical Narrative:    RNCM spoke with Kelsie, Division of Mental Health and Developmental Disabilities office / PASSAR office. She provided the PASSAR number for 30 days of rehab. Asked that we notify her group home that the plan is for her to return there after she is discharged.  Called Amy Melba with [facility] group home. Confirmed that patient will return there when she completes rehab. They wanted to know exactly where she will be on Christmas so they can visit and bring her gifts. Advised we would let them know where she is discharging to when a facility is identified.  Reached out to the following facilities: [facility] / does not accept [facility].   [facility] / out of network. [facility] / Does not do authorizations for longs drug stores. AD said she was checking to see if patient is clinically approved.                      Expected Discharge Plan and Services         Expected Discharge Date: 12/30/23                                     Social Drivers of Health (SDOH) Interventions SDOH Screenings   Food Insecurity: No Food Insecurity (12/19/2023)  Housing: Patient Unable To Answer (12/20/2023)  Transportation Needs: Patient Unable To Answer (12/20/2023)  Utilities: Not At Risk (12/19/2023)  Tobacco Use: Medium Risk (12/16/2023)    Readmission Risk Interventions     No data to display

## 2024-01-01 NOTE — Consult Note (Signed)
 [facility] Follow Up  Patient Name: .Sherry Bryan  MRN: [id number]  DOB: Feb 11, 1995  Consult Order details:  Orders (From admission, onward)     Start     Ordered   12/16/23 1406  IP CONSULT TO PSYCHIATRY       Ordering Provider: Willo Dunnings, MD  Provider:  (Not yet assigned)  Question:  Reason for consult:  Answer:  Medication management   12/16/23 1406   12/16/23 1406  CONSULT TO CALL ACT TEAM       Ordering Provider: Willo Dunnings, MD  Provider:  (Not yet assigned)  Question:  Reason for Consult?  Answer:  SI   12/16/23 1406             Mode of Visit: In person    Psychiatry Consult Evaluation  Service Date: January 01, 2024 LOS:  LOS: 15 days  Chief Complaint I ran off  Primary Psychiatric Diagnoses   Suicidal ideations   Assessment   Sherry Bryan is a 28 y.o. female admitted: Presented to the EDfor 12/16/2023  1:53 PM for suicidal ideation . She carries the psychiatric diagnoses of intellectual disability, borderline personality disorder, and PTSD  and has a past medical history of  blood clots on Eliquis , seizures.  12/31/2023: Patient was seen today during psychiatric rounds. Patient is alert and oriented X 4, calm, cooperative, and engages well in the interview. Patient reports she does not want to return back to her group home. She states, they treat me bad there, and I am going to hurt myself if I go back. Patient reports that she prefers to be admitted to a new group home at discharge. Patient endorses having depression and anxiety thinking about returning back to her current group home. She rates both the depression and anxiety as a 10/10. Patient reports she is sleeping and eating well. She denies having any suicidal or homicidal ideations, or perceptual disturbances. Patient has not displayed any unsafe behaviors and has been compliant with her medications and care. Will continue to round to assess patient's mental health  needs and provide guidance and support.   12/30/2023: Patient was seen this morning during psychiatric rounds. Patient is alert and oriented X 3, calm, drowsy, and difficult to engage in conversation. Patient is observed excessively sleepy with her eyes closed and unable to provide meaningful responses to the questions being asked. Patient informed that this provider will attempt to visit her at a later time and her eyes remained closed. Patient will continue to be monitored for unsafe behaviors and be provided safety per hospital protocol.   On assessment today, patient only participated minimally stating she did not feel physically well.  Current exam was limited due to this.  Patient was just seen here yesterday for suicidal ideation.  Patient reported running off from her group home due to not feeling safe.  When asked to elaborate, patient stated well I miss my mom and dad and its close to the holidays and it always gets worse.  Patient denied that anyone was physically or emotionally harming her at the group home.  As stated in triage note, patient endorsed suicidal thoughts with plan but denied intent. This is a chronic presentation for patient.   Patient denied homicidal ideation.  Patient denied auditory or visual hallucinations as well.  Patient reported being compliant with current medication regimen. Patient is established with outpatient providers.  This provider attempted to contact group home and legal guardian.  No answer at  this time, HIPAA compliant voicemail left for legal guardian requesting return call.  Patient will be observed overnight to ensure safety and monitor for any unsafe behaviors and allow for psychiatry team to get into contact with both group home and legal guardian.  12/17/2023: Patient seen on rounds today by psychiatry.  Patient reported still feeling not good when referring to her physical health.  Patient still endorsed suicidal thoughts, but reported today that her  plan was to take her medications.  Patient currently resides at a group home and medications are given by group home staff.  Patient does not have current access to her medications as they are given by staff.  Upon further questioning, patient reported she feels as though she always misses her parents more around the holidays and it is hard for her to handle.  Patient denied that she had ever spoken with a therapist about these concerns.  Patient did report that she would be willing to speak with a therapist about these things if she had one.  Patient reported she felt as though this would help.  Patient denied homicidal ideations.  Patient denied auditory or visual hallucinations as well.  Patient has continued to maintain safe behaviors while in the emergency department.  Per nursing staff and review of chart, patient has displayed no self-harm behaviors.  It does not appear that patient has required any IM agitation medications while holding in the emergency room. Although the patient endorses chronic suicidal ideation with an identified plan, they currently reside in a structured group home environment and have historically demonstrated the ability to maintain safety within that setting. The support and supervision available in the group home appear sufficient for ongoing community safety.  We will recommend that patient be established with therapy services along with her medication management team that sees her in the group home.  At this time, psychiatry will sign off.  Patient is currently being medically admitted.  Please reach out for any new concerns or questions.  12/26/2023: Psychiatry was reconsulted by the hospital team due to concerns regarding polypharmacy and increased lethargy in the patient. On today's examination, the patient was alert and oriented x4 but was observed to fall asleep intermittently during the assessment, requiring redirection to continue the interview. When awake, she provided  appropriate responses to questions. The patient denied experiencing excessive sleepiness or daytime drowsiness prior to her current medical illness and reported no recent medication adjustments, though she is noted to be a poor historian and does have a legal guardian. Review of her medication regimen reveals she is prescribed Lexapro  10 mg daily, Cogentin  1 mg twice daily, Seroquel  XR, trazodone , Uzedy  LAI and Cobenfy .  Reference medical team note for other medications that have been held for medical indications including clonidine , prazosin  and decreasing daily Ativan  dose.  The medical team has already initiated downward titration of her Seroquel  from baseline dose (50 mg nightly) to 12.5 mg nightly and trazodone  to 75 mg nightly (originally 300 mg nightly) given concerns for sedation. The patient currently denies suicidal and homicidal ideations as well as auditory or visual hallucinations. On current presentation there was no evidence of psychosis and patient did not appear to be responding to internal stimuli.When asked about previous movement disorders or indications for her current Cogentin  therapy, the patient was unable to provide reliable history. No extrapyramidal symptoms were noted on today's examination, though assessment is limited given her acute medical illness and current need for high-flow nasal cannula. This provider discussed  with the medical team that Cogentin  may be contributing to the patient's increased lethargy and recommended considering downward titration in 0.5 mg increments to assess for improvement in alertness. It was also noted that the patient's current acute medical status could be contributing to her lethargic presentation as well. The patient reports she is followed by [facility] for outpatient psychiatric care. Psychiatry will remain available for medication management consultation as needed during her medical admission. The patient does not require acute psychiatric  inpatient admission at this time.While future psychiatric events cannot be accurately predicted, the patient does not currently require acute inpatient psychiatric care and does not currently meet [facility]  involuntary commitment criteria.   12/18: Patient was rounded on today by psychiatry.  Patient reported doing a little better.  Patient today endorsed suicidal ideation without plan or intent, consistent with chronic baseline presentation. When asked to elaborate on current stressors, patient reports significant dissatisfaction with current group home placement, describing staff as terrible, rude, and disrespectful. Patient identifies this as major contributor to distress. Patient has resided at current group home for approximately one year.  Of note, patient has historically maintained safety in the community under group home supervision despite chronic baseline suicidal ideation. Patient has demonstrated safe behaviors throughout current hospitalization.  Patient denies homicidal ideation. When initially asked about hallucinations, patient reported seeing her real mom,however, upon clarification, patient describes these experiences occur during sleep and are dream-like in nature rather than true perceptual disturbances. Patient presenting with chronic suicidal ideation without acute change in safety risk. Current distress appears situational, related to dissatisfaction with group home environment. Patient has demonstrated capacity to maintain safety in supervised community setting and continues to display safe behaviors on unit. No evidence of acute psychosis or perceptual disturbances on current assessment.     Diagnoses:  Active Hospital problems: Principal Problem:   Rhinovirus infection Active Problems:   Hx of seizure disorder   Hx of gastroesophageal reflux (GERD)   Intellectual disability   Suicidal ideations   Depression   Acute hypoxic respiratory failure (HCC)    Hypothyroidism   Essential hypertension   Morbid obesity (HCC)   Schizoaffective disorder (HCC)   PTSD (post-traumatic stress disorder)    Plan   ## Psychiatric Medication Recommendations:  See above under assessment portion of note  ## Medical Decision Making Capacity: Patient has a guardian and has thus been adjudicated incompetent; please involve patients guardian in medical decision making  ## Further Work-up:   -- most recent EKG on 12/16/2023 had QtC of 444    ## Disposition:--Patient is medically admitted  ## Behavioral / Environmental: -Utilize compassion and acknowledge the patient's experiences while setting clear and realistic expectations for care.    ## Safety and Observation Level:  - Based on my clinical evaluation, I estimate the patient to be at low risk of self harm in the current setting. Patient chronically at an elevated risk due to history. Unit can continue with protcol safety checks - At this time, we recommend  routine. This decision is based on my review of the chart including patient's history and current presentation, interview of the patient, mental status examination, and consideration of suicide risk including evaluating suicidal ideation, plan, intent, suicidal or self-harm behaviors, risk factors, and protective factors. This judgment is based on our ability to directly address suicide risk, implement suicide prevention strategies, and develop a safety plan while the patient is in the clinical setting. Please contact our team if there is a concern  that risk level has changed.   Suicide Risk Assessment: Patient has following modifiable risk factors for suicide: N/A which we are addressing by utilizing therapeutic communication to give patient safe space to discuss current emotional status. Patient has following non-modifiable or demographic risk factors for suicide: psychiatric hospitalization Patient has the following protective factors against  suicide: Access to outpatient mental health care  Thank you for this consult request. Recommendations have been communicated to the primary team.  We will be available as needed at this time.   Camelia Mountain, NP        History of Present Illness  Relevant Aspects of [facility]   Patient Report:  Collected on initial interview with patient on initial presentation to hospital: On assessment today, patient only participated minimally stating she did not feel physically well.  Current exam was limited due to this.  Patient was just seen here yesterday for suicidal ideation.  Patient reported running off from her group home due to not feeling safe.  When asked to elaborate, patient stated well I miss my mom and dad and its close to the holidays and it always gets worse.  Patient denied that anyone was physically or emotionally harming her at the group home.  As stated in triage note, patient endorsed suicidal thoughts with plan but denied intent. This is a chronic presentation for patient.   Patient denied homicidal ideation.  Patient denied auditory or visual hallucinations as well.  Patient reported being compliant with current medication regimen. Patient is established with outpatient providers.  This provider attempted to contact group home and legal guardian.  No answer at this time, HIPAA compliant voicemail left for legal guardian requesting return call.  Patient will be observed overnight to ensure safety and monitor for any unsafe behaviors and allow for psychiatry team to get into contact with both group home and legal guardian.   See updated information under assessment portion of note  Psych ROS:  Depression: Denied Anxiety:  Denied Mania (lifetime and current): Denied Psychosis: (lifetime and current): Denied  Collateral information:  Attempted to contact legal guardian and group home-no answer at this time.      Psychiatric and Social History  Psychiatric History:   Information collected from Patient/chart review  Prev Dx/Sx: Bipolar disorder, Major depressive disorder, and Generalized anxiety disorder , IDD Current Psych Provider: [facility]  Home Meds (current): patient unsure Previous Med Trials: Invega, Haldol, Zyprexa , Abilify, Vraylar, Rexulti per patient report  Therapy: Unknown  Prior Psych Hospitalization: Yes  Prior Self Harm: Yes Prior Violence: Yes  Family Psych History: Patient reports family history of bipolar disorder, ADHD, ODD, and substance abuse in her mother  Family Hx suicide: unknown   Educational Hx: 11th grade Occupational Hx: Unemployed, receives disability Legal Hx: Unknown Living Situation: Group Home Access to weapons/lethal means: Denies   Substance History Alcohol: Denies Tobacco: Patient reports smoking 2 cigarettes/day and vaping Illicit drugs: Denies Prescription drug abuse: Denies Rehab hx: Denies  Exam Findings  Physical Exam: Reviewed and agree with the physical exam findings conducted by the medical provider Vital Signs:  Temp:  [97.8 F (36.6 C)-98.9 F (37.2 C)] 98.9 F (37.2 C) (12/21 2255) Pulse Rate:  [80-96] 84 (12/21 2255) Resp:  [16-20] 20 (12/21 2255) BP: (87-124)/(42-77) 100/42 (12/21 2255) SpO2:  [92 %-96 %] 96 % (12/21 2255) Blood pressure (!) 100/42, pulse 84, temperature 98.9 F (37.2 C), resp. rate 20, height 5' 4 (1.626 m), weight (!) 141.4 kg, SpO2 96%.  Body mass index is 53.51 kg/m.    Mental Status Exam: General Appearance: Fairly Groomed  Orientation:  Full (Time, Place, and Person)  Memory:  Fair  Concentration:  Fair  Recall:  Fair  Attention  Fair  Eye Contact:  Fair  Speech:  Slow  Language:  Fair  Volume:  Decreased  Mood: Okay  Affect:  Appropriate  Thought Process:  Coherent  Thought Content:  Logical  Suicidal Thoughts:  Passive S/I reported today  Homicidal Thoughts:  No  Judgement:  Poor  Insight:  Lacking  Psychomotor Activity:   Decreased  Akathisia:  No  Fund of Knowledge:  Fair      Assets:  Therapist, Sports  Cognition:  Impaired,  Moderate  ADL's: Impaired  AIMS (if indicated):        Other History   These have been pulled in through the EMR, reviewed, and updated if appropriate.  Family History:  The patient's family history is not on file.  Medical History: Past Medical History:  Diagnosis Date   Borderline personality disorder (HCC)    Constipation 05/15/2015   Depression    DMDD (disruptive mood dysregulation disorder) 05/15/2015   History of seasonal allergies 05/15/2015   Hx of gastroesophageal reflux (GERD) 05/15/2015   Hx of seizure disorder 05/15/2015   Intellectual disability 05/22/2015   PTSD (post-traumatic stress disorder)    Seizures (HCC)    last one in 2015   Suicidal ideation     Surgical History: History reviewed. No pertinent surgical history.   Medications:   Current Facility-Administered Medications:    acetaminophen  (TYLENOL ) tablet 650 mg, 650 mg, Oral, Q6H PRN, 650 mg at 12/31/23 1828 **OR** acetaminophen  (TYLENOL ) suppository 650 mg, 650 mg, Rectal, Q6H PRN, Foust, Katy L, NP   albuterol  (PROVENTIL ) (2.5 MG/3ML) 0.083% nebulizer solution 2.5 mg, 2.5 mg, Nebulization, Q2H PRN, Foust, Katy L, NP, 2.5 mg at 12/24/23 2040   apixaban  (ELIQUIS ) tablet 5 mg, 5 mg, Oral, BID, Jessup, Charles, MD, 5 mg at 12/31/23 2159   benztropine  (COGENTIN ) tablet 0.5 mg, 0.5 mg, Oral, Daily, 0.5 mg at 12/31/23 9096 **AND** benztropine  (COGENTIN ) tablet 1 mg, 1 mg, Oral, QHS, Franchot Novel, MD, 1 mg at 12/31/23 2159   dextromethorphan -guaiFENesin  (MUCINEX  DM) 30-600 MG per 12 hr tablet 1 tablet, 1 tablet, Oral, BID, Franchot Novel, MD, 1 tablet at 12/31/23 2159   divalproex  (DEPAKOTE ) DR tablet 1,000 mg, 1,000 mg, Oral, BID, Jessup, Charles, MD, 1,000 mg at 12/31/23 2200   escitalopram  (LEXAPRO ) tablet 10 mg, 10 mg, Oral, Daily, Jessup, Charles, MD,  10 mg at 12/31/23 0901   ferrous sulfate  tablet 325 mg, 325 mg, Oral, Q breakfast, Franchot Novel, MD, 325 mg at 12/31/23 0901   folic acid  (FOLVITE ) tablet 1 mg, 1 mg, Oral, Daily, Jessup, Charles, MD, 1 mg at 12/31/23 0901   levETIRAcetam  (KEPPRA ) tablet 500 mg, 500 mg, Oral, BID, Jessup, Charles, MD, 500 mg at 12/31/23 2200   levothyroxine  (SYNTHROID ) tablet 50 mcg, 50 mcg, Oral, Q0600, Dail Rankin RAMAN, RPH, 50 mcg at 12/31/23 0900   LORazepam  (ATIVAN ) tablet 0.5 mg, 0.5 mg, Oral, QHS, Franchot Novel, MD, 0.5 mg at 12/31/23 2159   ondansetron  (ZOFRAN ) tablet 4 mg, 4 mg, Oral, Q6H PRN, 4 mg at 12/30/23 0948 **OR** ondansetron  (ZOFRAN ) injection 4 mg, 4 mg, Intravenous, Q6H PRN, Foust, Katy L, NP, 4 mg at 12/28/23 1800   pantoprazole  (PROTONIX ) EC tablet 80 mg, 80 mg, Oral, Daily, Jessup, Charles, MD,  80 mg at 12/31/23 0901   QUEtiapine  (SEROQUEL ) tablet 12.5 mg, 12.5 mg, Oral, QHS, Franchot Novel, MD, 12.5 mg at 12/31/23 2159   traZODone  (DESYREL ) tablet 75 mg, 75 mg, Oral, QHS, Franchot Novel, MD, 75 mg at 12/31/23 2200   Xanomeline-Trospium  Chloride 125-30 MG CAPS 1 capsule, 1 capsule, Oral, BID, Niels Kayla FALCON, [facility], 1 capsule at 12/31/23 2159  Allergies: Allergies  Allergen Reactions   Ritalin [Methylphenidate Hcl] Other (See Comments)    seizures   Abilify [Aripiprazole] Other (See Comments)    Shaking or tremors   Lithium      Camelia Mountain, NP Case was discussed with supervising physician Dr. Ruther who is agreeable with current plan.

## 2024-01-02 ENCOUNTER — Ambulatory Visit: Payer: MEDICAID | Admitting: Student

## 2024-01-02 DIAGNOSIS — B348 Other viral infections of unspecified site: Secondary | ICD-10-CM | POA: Diagnosis not present

## 2024-01-02 MED ORDER — POLYETHYLENE GLYCOL 3350 17 G PO PACK
17.0000 g | PACK | Freq: Every day | ORAL | Status: DC
  Administered 2024-01-02 – 2024-01-05 (×4): 17 g via ORAL
  Filled 2024-01-02 (×4): qty 1

## 2024-01-02 MED ORDER — SENNOSIDES-DOCUSATE SODIUM 8.6-50 MG PO TABS
1.0000 | ORAL_TABLET | Freq: Every evening | ORAL | Status: DC | PRN
  Administered 2024-01-02: 1 via ORAL
  Filled 2024-01-02: qty 1

## 2024-01-02 NOTE — TOC Progression Note (Signed)
 Transition of Care [facility]) - Progression Note    Patient Details  Name: Sherry Bryan MRN: [id number] Date of Birth: January 02, 1996  [facility] of [facility]) CM/SW Contact  Nathanael CHRISTELLA Ring, RN Phone Number: 01/02/2024, 4:12 PM  Clinical Narrative:     Order for NIV has been signed and returned to [facility] with [employer].                     Expected Discharge Plan and Services         Expected Discharge Date: 12/30/23                                     Social Drivers of Health (SDOH) Interventions SDOH Screenings   Food Insecurity: No Food Insecurity (12/19/2023)  Housing: Patient Unable To Answer (12/20/2023)  Transportation Needs: Patient Unable To Answer (12/20/2023)  Utilities: Not At Risk (12/19/2023)  Tobacco Use: Medium Risk (12/16/2023)    Readmission Risk Interventions     No data to display

## 2024-01-02 NOTE — TOC Progression Note (Signed)
 Transition of Care [facility]) - Progression Note    Patient Details  Name: Belmira Daley MRN: [id number] Date of Birth: Aug 26, 1995  Transition of Care [facility]) CM/SW Contact  Nathanael CHRISTELLA Ring, RN Phone Number: 01/02/2024, 2:44 PM  Clinical Narrative:     CM spoke with Latoya from the Group home this afternoon.  She wanted to know if the patient had to go to a rehab or could she come home and they work with her.  I said as long as the care home was understanding of her current needs and could meet them then we could DC her home.  She says that she already has a hospital bed, and they have walkers and wheelchairs and they would be able to pick her up and transport her at DC.  CM is unable to find Home Health so patient would have to discharge with OP PT.  Provider also says that she needs home oxygen and a NIV.  Provider is going to order an overnight oximetry and Bedside spirometry.  Sounds like she only needs oxygen at night and is not desaturating during activity here in the room during the day.  Reached out to [facility] with [employer] and he will assist with getting the oxygen and NIV.                     Expected Discharge Plan and Services         Expected Discharge Date: 12/30/23                                     Social Drivers of Health (SDOH) Interventions SDOH Screenings   Food Insecurity: No Food Insecurity (12/19/2023)  Housing: Patient Unable To Answer (12/20/2023)  Transportation Needs: Patient Unable To Answer (12/20/2023)  Utilities: Not At Risk (12/19/2023)  Tobacco Use: Medium Risk (12/16/2023)    Readmission Risk Interventions     No data to display

## 2024-01-02 NOTE — Plan of Care (Signed)
" °  Problem: Education: Goal: Knowledge of General Education information will improve Description: Including pain rating scale, medication(s)/side effects and non-pharmacologic comfort measures Outcome: Progressing   Problem: Health Behavior/Discharge Planning: Goal: Ability to manage health-related needs will improve Outcome: Progressing   Problem: Clinical Measurements: Goal: Ability to maintain clinical measurements within normal limits will improve Outcome: Progressing Goal: Will remain free from infection Outcome: Progressing Goal: Diagnostic test results will improve Outcome: Progressing Goal: Respiratory complications will improve Outcome: Progressing Goal: Cardiovascular complication will be avoided Outcome: Progressing   Problem: Activity: Goal: Risk for activity intolerance will decrease Outcome: Progressing   Problem: Nutrition: Goal: Adequate nutrition will be maintained Outcome: Progressing   Problem: Coping: Goal: Level of anxiety will decrease Outcome: Progressing   Problem: Elimination: Goal: Will not experience complications related to urinary retention Outcome: Progressing   Problem: Pain Managment: Goal: General experience of comfort will improve and/or be controlled Outcome: Progressing   Problem: Safety: Goal: Ability to remain free from injury will improve Outcome: Progressing   Problem: Skin Integrity: Goal: Risk for impaired skin integrity will decrease Outcome: Progressing   Problem: Activity: Goal: Ability to tolerate increased activity will improve Outcome: Progressing   Problem: Clinical Measurements: Goal: Ability to maintain a body temperature in the normal range will improve Outcome: Progressing   Problem: Respiratory: Goal: Ability to maintain adequate ventilation will improve Outcome: Progressing Goal: Ability to maintain a clear airway will improve Outcome: Progressing   "

## 2024-01-02 NOTE — Progress Notes (Signed)
 Occupational Therapy Treatment Patient Details Name: Sherry Bryan MRN: [id number] DOB: [birth year] Today's Date: 01/02/2024   History of present illness Pt is a 28 year old female admitted with sepsis due to pneumonia, acute hypoxic respiratory failure; of note, pt presented to ED with suicidal ideations, Patient currently has no suicidal ideations    PMH significant for  hypertension, hypothyroidism, seizure disorder, GERD, prior blood clots on Eliquis , major depressive disorder, DMDD, intellectual disability, borderline personality disorder, PTSD, and adjustment disorder   OT comments  Patient seen for OT treatment on this date. Upon arrival to room patient resting in bed, initially refusing therapy but eventually agreeable with max coaxing. Patient on 2L at rest, 92%, removed O2 for mobility and O2 remained above 95% while on RA. Patient stood at sink for 5 minutes to complete grooming tasks, posterior sway noted, requires close supervision and verbal cues to maintain attention to task. Patient ambulated with CGA/SBA to bathroom to perform toileting. Patient returned to bed without physical A.  Patient ended treatment in bed with bed/chair alarm on and all needs within reach. Patient making good progress toward goals, will continue to follow POC. Discharge recommendation updated to reflect patient preferences and level of assist that will be available to her upon discharge.       If plan is discharge home, recommend the following:  A little help with walking and/or transfers;A little help with bathing/dressing/bathroom;Direct supervision/assist for medications management;Direct supervision/assist for financial management   Equipment Recommendations  BSC/3in1    Recommendations for Other Services      Precautions / Restrictions Precautions Precautions: Fall Recall of Precautions/Restrictions: Impaired Precaution/Restrictions Comments: monitor O2 Restrictions Weight Bearing  Restrictions Per Provider Order: No       Mobility Bed Mobility Overal bed mobility: Needs Assistance Bed Mobility: Supine to Sit, Sit to Supine     Supine to sit: Min assist Sit to supine: Supervision   General bed mobility comments: required min A to go from sidelying to sitting EOB    Transfers Overall transfer level: Needs assistance Equipment used: None Transfers: Sit to/from Stand Sit to Stand: Supervision, Contact guard assist           General transfer comment: supervision/CGA     Balance Overall balance assessment: Needs assistance Sitting-balance support: Feet supported Sitting balance-Leahy Scale: Good Sitting balance - Comments: sat EOB without assist but cueing was needed Postural control: Posterior lean Standing balance support: During functional activity, No upper extremity supported Standing balance-Leahy Scale: Fair Standing balance comment: close supervision/SBA in stance                           ADL either performed or assessed with clinical judgement   ADL       Grooming: Wash/dry hands;Wash/dry face;Oral care;Standing;Contact guard assist Grooming Details (indicate cue type and reason): SBA while standing                 Toilet Transfer: Contact guard assist;Supervision/safety;Regular Toilet;Ambulation   Toileting- Clothing Manipulation and Hygiene: Contact guard assist;Supervision/safety;Sit to/from stand         General ADL Comments: close supervision required while in stance due to balance deficits/lethargy    Extremity/Trunk Assessment              Vision       Perception     Praxis     Communication Communication Communication: Impaired Factors Affecting Communication: Difficulty expressing self   Cognition Arousal: Lethargic  Behavior During Therapy: Flat affect Cognition: History of cognitive impairments, Cognition impaired, No family/caregiver present to determine baseline                                Following commands: Impaired Following commands impaired: Follows one step commands with increased time      Cueing   Cueing Techniques: Verbal cues, Tactile cues  Exercises      Shoulder Instructions       General Comments RA during tx, O2 above 93% throughout.    Pertinent Vitals/ Pain       Pain Assessment Pain Assessment: No/denies pain  Home Living                                          Prior Functioning/Environment              Frequency  Min 2X/week        Progress Toward Goals  OT Goals(current goals can now be found in the care plan section)  Progress towards OT goals: Progressing toward goals     Plan      Co-evaluation                 AM-PAC OT 6 Clicks Daily Activity     Outcome Measure   Help from another person eating meals?: None Help from another person taking care of personal grooming?: A Little Help from another person toileting, which includes using toliet, bedpan, or urinal?: A Little Help from another person bathing (including washing, rinsing, drying)?: A Little Help from another person to put on and taking off regular upper body clothing?: A Little Help from another person to put on and taking off regular lower body clothing?: A Little 6 Click Score: 19    End of Session Equipment Utilized During Treatment: Oxygen  OT Visit Diagnosis: Other abnormalities of gait and mobility (R26.89)   Activity Tolerance Patient limited by lethargy   Patient Left in bed;with call bell/phone within reach;with bed alarm set   Nurse Communication Mobility status        Time: 8593-8572 OT Time Calculation (min): 21 min  Charges: OT General Charges $OT Visit: 1 Visit OT Treatments $Self Care/Home Management : 8-22 mins  Rogers Clause, OT/L MSOT, 01/02/2024

## 2024-01-02 NOTE — Consult Note (Signed)
 [facility] Follow Up  Patient Name: .Sherry Bryan  MRN: [id number]  DOB: [birth year]  Consult Order details:  Orders (From admission, onward)     Start     Ordered   12/16/23 1406  IP CONSULT TO PSYCHIATRY       Ordering Provider: Willo Dunnings, MD  Provider:  (Not yet assigned)  Question:  Reason for consult:  Answer:  Medication management   12/16/23 1406   12/16/23 1406  CONSULT TO CALL ACT TEAM       Ordering Provider: Willo Dunnings, MD  Provider:  (Not yet assigned)  Question:  Reason for Consult?  Answer:  SI   12/16/23 1406             Mode of Visit: In person    Psychiatry Consult Evaluation  Service Date: January 02, 2024 LOS:  LOS: 16 days  Chief Complaint I ran off  Primary Psychiatric Diagnoses   Suicidal ideations   Assessment   Sherry Bryan is a 28 y.o. female admitted: Presented to the EDfor 12/16/2023  1:53 PM for suicidal ideation . She carries the psychiatric diagnoses of intellectual disability, borderline personality disorder, and PTSD  and has a past medical history of  blood clots on Eliquis , seizures.  01/02/2024:  Patient was seen today during psychiatric rounds. Patient is alert and oriented X 4, calm, cooperative, and engages well in the interview.  On today's exam, patient denied suicidal or homicidal ideations.  Patient also denied auditory or visual hallucinations as well.  Patient reported tolerating current medications well and denied any noted side effects.  Patient reported I feel a lot better.  Patient reported eating and sleeping well.  Patient denied any acute concerns on today's rounds.On current presentation there was no evidence of psychosis and patient did not appear to be responding to internal stimuli.  12/31/2023: Patient was seen today during psychiatric rounds. Patient is alert and oriented X 4, calm, cooperative, and engages well in the interview. Patient reports she does not want to return  back to her group home. She states, they treat me bad there, and I am going to hurt myself if I go back. Patient reports that she prefers to be admitted to a new group home at discharge. Patient endorses having depression and anxiety thinking about returning back to her current group home. She rates both the depression and anxiety as a 10/10. Patient reports she is sleeping and eating well. She denies having any suicidal or homicidal ideations, or perceptual disturbances. Patient has not displayed any unsafe behaviors and has been compliant with her medications and care. Will continue to round to assess patient's mental health needs and provide guidance and support.   12/30/2023: Patient was seen this morning during psychiatric rounds. Patient is alert and oriented X 3, calm, drowsy, and difficult to engage in conversation. Patient is observed excessively sleepy with her eyes closed and unable to provide meaningful responses to the questions being asked. Patient informed that this provider will attempt to visit her at a later time and her eyes remained closed. Patient will continue to be monitored for unsafe behaviors and be provided safety per hospital protocol.   On assessment today, patient only participated minimally stating she did not feel physically well.  Current exam was limited due to this.  Patient was just seen here yesterday for suicidal ideation.  Patient reported running off from her group home due to not feeling safe.  When asked to elaborate, patient  stated well I miss my mom and dad and its close to the holidays and it always gets worse.  Patient denied that anyone was physically or emotionally harming her at the group home.  As stated in triage note, patient endorsed suicidal thoughts with plan but denied intent. This is a chronic presentation for patient.   Patient denied homicidal ideation.  Patient denied auditory or visual hallucinations as well.  Patient reported being compliant  with current medication regimen. Patient is established with outpatient providers.  This provider attempted to contact group home and legal guardian.  No answer at this time, HIPAA compliant voicemail left for legal guardian requesting return call.  Patient will be observed overnight to ensure safety and monitor for any unsafe behaviors and allow for psychiatry team to get into contact with both group home and legal guardian.  12/17/2023: Patient seen on rounds today by psychiatry.  Patient reported still feeling not good when referring to her physical health.  Patient still endorsed suicidal thoughts, but reported today that her plan was to take her medications.  Patient currently resides at a group home and medications are given by group home staff.  Patient does not have current access to her medications as they are given by staff.  Upon further questioning, patient reported she feels as though she always misses her parents more around the holidays and it is hard for her to handle.  Patient denied that she had ever spoken with a therapist about these concerns.  Patient did report that she would be willing to speak with a therapist about these things if she had one.  Patient reported she felt as though this would help.  Patient denied homicidal ideations.  Patient denied auditory or visual hallucinations as well.  Patient has continued to maintain safe behaviors while in the emergency department.  Per nursing staff and review of chart, patient has displayed no self-harm behaviors.  It does not appear that patient has required any IM agitation medications while holding in the emergency room. Although the patient endorses chronic suicidal ideation with an identified plan, they currently reside in a structured group home environment and have historically demonstrated the ability to maintain safety within that setting. The support and supervision available in the group home appear sufficient for ongoing community  safety.  We will recommend that patient be established with therapy services along with her medication management team that sees her in the group home.  At this time, psychiatry will sign off.  Patient is currently being medically admitted.  Please reach out for any new concerns or questions.  12/26/2023: Psychiatry was reconsulted by the hospital team due to concerns regarding polypharmacy and increased lethargy in the patient. On today's examination, the patient was alert and oriented x4 but was observed to fall asleep intermittently during the assessment, requiring redirection to continue the interview. When awake, she provided appropriate responses to questions. The patient denied experiencing excessive sleepiness or daytime drowsiness prior to her current medical illness and reported no recent medication adjustments, though she is noted to be a poor historian and does have a legal guardian. Review of her medication regimen reveals she is prescribed Lexapro  10 mg daily, Cogentin  1 mg twice daily, Seroquel  XR, trazodone , Uzedy  LAI and Cobenfy .  Reference medical team note for other medications that have been held for medical indications including clonidine , prazosin  and decreasing daily Ativan  dose.  The medical team has already initiated downward titration of her Seroquel  from baseline dose (50 mg  nightly) to 12.5 mg nightly and trazodone  to 75 mg nightly (originally 300 mg nightly) given concerns for sedation. The patient currently denies suicidal and homicidal ideations as well as auditory or visual hallucinations. On current presentation there was no evidence of psychosis and patient did not appear to be responding to internal stimuli.When asked about previous movement disorders or indications for her current Cogentin  therapy, the patient was unable to provide reliable history. No extrapyramidal symptoms were noted on today's examination, though assessment is limited given her acute medical illness and  current need for high-flow nasal cannula. This provider discussed with the medical team that Cogentin  may be contributing to the patient's increased lethargy and recommended considering downward titration in 0.5 mg increments to assess for improvement in alertness. It was also noted that the patient's current acute medical status could be contributing to her lethargic presentation as well. The patient reports she is followed by [facility] for outpatient psychiatric care. Psychiatry will remain available for medication management consultation as needed during her medical admission. The patient does not require acute psychiatric inpatient admission at this time.While future psychiatric events cannot be accurately predicted, the patient does not currently require acute inpatient psychiatric care and does not currently meet [facility]  involuntary commitment criteria.   12/18: Patient was rounded on today by psychiatry.  Patient reported doing a little better.  Patient today endorsed suicidal ideation without plan or intent, consistent with chronic baseline presentation. When asked to elaborate on current stressors, patient reports significant dissatisfaction with current group home placement, describing staff as terrible, rude, and disrespectful. Patient identifies this as major contributor to distress. Patient has resided at current group home for approximately one year.  Of note, patient has historically maintained safety in the community under group home supervision despite chronic baseline suicidal ideation. Patient has demonstrated safe behaviors throughout current hospitalization.  Patient denies homicidal ideation. When initially asked about hallucinations, patient reported seeing her real mom,however, upon clarification, patient describes these experiences occur during sleep and are dream-like in nature rather than true perceptual disturbances. Patient presenting with chronic suicidal  ideation without acute change in safety risk. Current distress appears situational, related to dissatisfaction with group home environment. Patient has demonstrated capacity to maintain safety in supervised community setting and continues to display safe behaviors on unit. No evidence of acute psychosis or perceptual disturbances on current assessment.     Diagnoses:  Active Hospital problems: Principal Problem:   Rhinovirus infection Active Problems:   Hx of seizure disorder   Hx of gastroesophageal reflux (GERD)   Intellectual disability   Suicidal ideations   Depression   Acute hypoxic respiratory failure (HCC)   Hypothyroidism   Essential hypertension   Morbid obesity (HCC)   Schizoaffective disorder (HCC)   PTSD (post-traumatic stress disorder)    Plan   ## Psychiatric Medication Recommendations:  See above under assessment portion of note  ## Medical Decision Making Capacity: Patient has a guardian and has thus been adjudicated incompetent; please involve patients guardian in medical decision making  ## Further Work-up:   -- most recent EKG on 12/16/2023 had QtC of 444    ## Disposition:--There are no psychiatric contraindications to discharge  ## Behavioral / Environmental: -Utilize compassion and acknowledge the patient's experiences while setting clear and realistic expectations for care.    ## Safety and Observation Level:  - Based on my clinical evaluation, I estimate the patient to be at low risk of self harm in the current setting. Patient  chronically at an elevated risk due to history. Unit can continue with protcol safety checks - At this time, we recommend  routine. This decision is based on my review of the chart including patient's history and current presentation, interview of the patient, mental status examination, and consideration of suicide risk including evaluating suicidal ideation, plan, intent, suicidal or self-harm behaviors, risk factors, and  protective factors. This judgment is based on our ability to directly address suicide risk, implement suicide prevention strategies, and develop a safety plan while the patient is in the clinical setting. Please contact our team if there is a concern that risk level has changed.   Suicide Risk Assessment: Patient has following modifiable risk factors for suicide: N/A which we are addressing by utilizing therapeutic communication to give patient safe space to discuss current emotional status. Patient has following non-modifiable or demographic risk factors for suicide: psychiatric hospitalization Patient has the following protective factors against suicide: Access to outpatient mental health care  Thank you for this consult request. Recommendations have been communicated to the primary team.  We will be available as needed at this time.   Zelda Sharps, NP        History of Present Illness  Relevant Aspects of [facility]   Patient Report:  Collected on initial interview with patient on initial presentation to hospital: On assessment today, patient only participated minimally stating she did not feel physically well.  Current exam was limited due to this.  Patient was just seen here yesterday for suicidal ideation.  Patient reported running off from her group home due to not feeling safe.  When asked to elaborate, patient stated well I miss my mom and dad and its close to the holidays and it always gets worse.  Patient denied that anyone was physically or emotionally harming her at the group home.  As stated in triage note, patient endorsed suicidal thoughts with plan but denied intent. This is a chronic presentation for patient.   Patient denied homicidal ideation.  Patient denied auditory or visual hallucinations as well.  Patient reported being compliant with current medication regimen. Patient is established with outpatient providers.  This provider attempted to contact group home and  legal guardian.  No answer at this time, HIPAA compliant voicemail left for legal guardian requesting return call.  Patient will be observed overnight to ensure safety and monitor for any unsafe behaviors and allow for psychiatry team to get into contact with both group home and legal guardian.   See updated information under assessment portion of note  Psych ROS:  Depression: Denied Anxiety:  Denied Mania (lifetime and current): Denied Psychosis: (lifetime and current): Denied  Collateral information:  Attempted to contact legal guardian and group home-no answer at this time.      Psychiatric and Social History  Psychiatric History:  Information collected from Patient/chart review  Prev Dx/Sx: Bipolar disorder, Major depressive disorder, and Generalized anxiety disorder , IDD Current Psych Provider: [facility]  Home Meds (current): patient unsure Previous Med Trials: Invega, Haldol, Zyprexa , Abilify, Vraylar, Rexulti per patient report  Therapy: Unknown  Prior Psych Hospitalization: Yes  Prior Self Harm: Yes Prior Violence: Yes  Family Psych History: Patient reports family history of bipolar disorder, ADHD, ODD, and substance abuse in her mother  Family Hx suicide: unknown   Educational Hx: 11th grade Occupational Hx: Unemployed, receives disability Legal Hx: Unknown Living Situation: Group Home Access to weapons/lethal means: Denies   Substance History Alcohol: Denies Tobacco: Patient reports  smoking 2 cigarettes/day and vaping Illicit drugs: Denies Prescription drug abuse: Denies Rehab hx: Denies  Exam Findings  Physical Exam: Reviewed and agree with the physical exam findings conducted by the medical provider Vital Signs:  Temp:  [97.7 F (36.5 C)-98.6 F (37 C)] 98.3 F (36.8 C) (12/23 0738) Pulse Rate:  [88-98] 98 (12/23 0444) Resp:  [16-19] 17 (12/23 0738) BP: (84-102)/(30-75) 102/63 (12/23 0738) SpO2:  [91 %-96 %] 93 % (12/23 0738) Blood pressure  102/63, pulse 98, temperature 98.3 F (36.8 C), temperature source Oral, resp. rate 17, height 5' 4 (1.626 m), weight (!) 141.4 kg, SpO2 93%. Body mass index is 53.51 kg/m.    Mental Status Exam: General Appearance: Fairly Groomed  Orientation:  Full (Time, Place, and Person)  Memory:  Fair  Concentration:  Fair  Recall:  Fair  Attention  Fair  Eye Contact:  Fair  Speech:  Slow  Language:  Fair  Volume:  Decreased  Mood: Okay  Affect:  Appropriate  Thought Process:  Coherent  Thought Content:  Logical  Suicidal Thoughts:  Denied  Homicidal Thoughts:  No  Judgement:  Poor  Insight:  Lacking  Psychomotor Activity:  WNL  Akathisia:  No  Fund of Knowledge:  Fair      Assets:  Therapist, Sports  Cognition:  Impaired,  Moderate  ADL's: Impaired  AIMS (if indicated):        Other History   These have been pulled in through the EMR, reviewed, and updated if appropriate.  Family History:  The patient's family history is not on file.  Medical History: Past Medical History:  Diagnosis Date   Borderline personality disorder (HCC)    Constipation 05/15/2015   Depression    DMDD (disruptive mood dysregulation disorder) 05/15/2015   History of seasonal allergies 05/15/2015   Hx of gastroesophageal reflux (GERD) 05/15/2015   Hx of seizure disorder 05/15/2015   Intellectual disability 05/22/2015   PTSD (post-traumatic stress disorder)    Seizures (HCC)    last one in 2015   Suicidal ideation     Surgical History: History reviewed. No pertinent surgical history.   Medications:   Current Facility-Administered Medications:    acetaminophen  (TYLENOL ) tablet 650 mg, 650 mg, Oral, Q6H PRN, 650 mg at 12/31/23 1828 **OR** acetaminophen  (TYLENOL ) suppository 650 mg, 650 mg, Rectal, Q6H PRN, Foust, Katy L, NP   albuterol  (PROVENTIL ) (2.5 MG/3ML) 0.083% nebulizer solution 2.5 mg, 2.5 mg, Nebulization, Q2H PRN, Foust, Katy L, NP, 2.5 mg at  12/24/23 2040   apixaban  (ELIQUIS ) tablet 5 mg, 5 mg, Oral, BID, Jessup, Charles, MD, 5 mg at 01/02/24 9043   benztropine  (COGENTIN ) tablet 0.5 mg, 0.5 mg, Oral, Daily, 0.5 mg at 01/02/24 9047 **AND** benztropine  (COGENTIN ) tablet 1 mg, 1 mg, Oral, QHS, Franchot Novel, MD, 1 mg at 01/01/24 2144   dextromethorphan -guaiFENesin  (MUCINEX  DM) 30-600 MG per 12 hr tablet 1 tablet, 1 tablet, Oral, BID, Franchot Novel, MD, 1 tablet at 01/02/24 9046   divalproex  (DEPAKOTE ) DR tablet 1,000 mg, 1,000 mg, Oral, BID, Jessup, Charles, MD, 1,000 mg at 01/02/24 9046   escitalopram  (LEXAPRO ) tablet 10 mg, 10 mg, Oral, Daily, Jessup, Charles, MD, 10 mg at 01/02/24 9042   ferrous sulfate  tablet 325 mg, 325 mg, Oral, Q breakfast, Franchot Novel, MD, 325 mg at 01/02/24 9046   folic acid  (FOLVITE ) tablet 1 mg, 1 mg, Oral, Daily, Jessup, Charles, MD, 1 mg at 01/02/24 9047   levETIRAcetam  (KEPPRA ) tablet 500 mg,  500 mg, Oral, BID, Jessup, Charles, MD, 500 mg at 01/02/24 9046   levothyroxine  (SYNTHROID ) tablet 50 mcg, 50 mcg, Oral, Q0600, Dail Rankin RAMAN, RPH, 50 mcg at 01/02/24 9388   LORazepam  (ATIVAN ) tablet 0.5 mg, 0.5 mg, Oral, QHS, Franchot Novel, MD, 0.5 mg at 01/01/24 2142   midodrine  (PROAMATINE ) tablet 5 mg, 5 mg, Oral, TID WC, Jens Durand, MD, 5 mg at 01/02/24 9043   ondansetron  (ZOFRAN ) tablet 4 mg, 4 mg, Oral, Q6H PRN, 4 mg at 12/30/23 0948 **OR** ondansetron  (ZOFRAN ) injection 4 mg, 4 mg, Intravenous, Q6H PRN, Foust, Katy L, NP, 4 mg at 12/28/23 1800   pantoprazole  (PROTONIX ) EC tablet 80 mg, 80 mg, Oral, Daily, Jessup, Charles, MD, 80 mg at 01/02/24 9048   QUEtiapine  (SEROQUEL ) tablet 12.5 mg, 12.5 mg, Oral, QHS, Franchot Novel, MD, 12.5 mg at 01/01/24 2148   traZODone  (DESYREL ) tablet 75 mg, 75 mg, Oral, QHS, Franchot Novel, MD, 75 mg at 01/01/24 2150   Xanomeline-Trospium  Chloride 125-30 MG CAPS 1 capsule, 1 capsule, Oral, BID, Niels Kayla FALCON, [facility], 1 capsule at 01/02/24  9045  Allergies: Allergies  Allergen Reactions   Ritalin [Methylphenidate Hcl] Other (See Comments)    seizures   Abilify [Aripiprazole] Other (See Comments)    Shaking or tremors   Lithium      Zelda Sharps, NP This note was created using Dragon dictation software. Please excuse any inadvertent transcription errors. Case was discussed with supervising physician Dr. Jadapalle who is agreeable with current plan.

## 2024-01-02 NOTE — Progress Notes (Signed)
 PT Cancellation Note  Patient Details Name: Sherry Bryan MRN: [id number] DOB: 02/14/1995   Cancelled Treatment:     Pt moved to room 115 shortly before arrival to assess mobility and O2 needs. Pt resting in bed, initially opened eyes then refused to acknowledge therapist at bedside. Overall poor participation since admission.   [name] 01/02/2024, 3:08 PM

## 2024-01-02 NOTE — Progress Notes (Signed)
 Placed pt on overnight pulse oximetry on RA. RN made aware

## 2024-01-02 NOTE — Progress Notes (Addendum)
 "    Progress Note    Sherry Bryan  FMW:[id number] DOB: [birth year]  DOA: 12/16/2023 PCP: Pcp, No      Brief Narrative:    Medical records reviewed and are as summarized below:  Sherry Bryan is a 28 y.o. female with past medical history of hypertension, hypothyroidism, seizure disorder, GERD, prior blood clots on Eliquis , major depressive disorder, DMDD, intellectual disability, borderline personality disorder, PTSD, and adjustment disorder.  She presented to [facility] 12/14/2023 with reports of suicidal ideation and productive cough.  Ultimately she was medically cleared and she remained in the ED pending psychiatric evaluation.  On the morning of 12/17/2023, she reported cough productive of greenish sputum, shortness of breath and chest pain.  She said she had been coughing for about a week.  She has not had any known sick contacts.  ED Course: Vitals on 12/17/2023-temp 38.6 C, BP 98/71 and as low as 89/53 with MAP of 63, RR 21, SpO2 89% on room air and improving to 93% on 4 L via nasal cannula. CT head obtained and shows no acute intracranial abnormality with mucosal thickening and air-fluid levels in bilateral maxillary ethmoid sinuses and left sphenoid sinus.  CTA PE obtained and negative for PE, did show ground glass and streaky opacities in the lower lobes bilaterally.   TRH was contacted for admission.       Assessment/Plan:   Principal Problem:   Rhinovirus infection Active Problems:   Hx of seizure disorder   Hx of gastroesophageal reflux (GERD)   Intellectual disability   Suicidal ideations   Depression   Acute hypoxic respiratory failure (HCC)   Hypothyroidism   Essential hypertension   Morbid obesity (HCC)   Schizoaffective disorder (HCC)   PTSD (post-traumatic stress disorder)    Body mass index is 53.51 kg/m.  (Class III obesity)   Rhinovirus infection, superimposed bacterial pneumonia: Completed 5 days of ceftriaxone  and  azithromycin . Respiratory viral panel positive for rhinovirus.   Acute on chronic hypoxic respiratory failure with hypercapnia, probable OHS: Hypoxia has improved.  Oxygen saturation was at least 95% while on room air when she worked with occupational therapist today. Overnight oximetry has been ordered to determine need for nocturnal oxygen. Patient has not been using BiPAP at night because she says she does not like it.  Encouraged the use of BiPAP nightly to reduce the risk of respiratory complications.  Since she has decided to go back to the group home, I have recommended NIV/trilogy machine.  This is portable and hopefully she will tolerate this.  Consulted transition of care team to assist with securing trilogy machine for discharge.  Case discussed with Jeanna, case production designer, theatre/television/film.  Spirometry with graph ordered as requested by DME company.   Due to the severity of the patient's chronic respiratory failure, secondary to OHS and Restrictive Thoracic Disorder, the patient requires volume targeted pressure support from Home Mechanical Ventilation. BIPAP, BIPAP ST/ STA (RAD) have been ruled out. RAD with or without back up rate does not supply volume targeted ventilation to manage the patients Hypercapnia. OSA is not the predominant cause of their hypercapnia, and HMV is not being ordered to treat OSA. The patient requires ventilatory support for more than eight hours per 24-hour period to normalize the patient's PaCO2 and prevent readmissions. Failure to adequately ventilate will result in serious harm and/or death to the patient.   Recurrent hypotension: Continue midodrine  Clonidine , HCTZ, Toprol -XL and prazosin  on hold   Schizoaffective disorder, PTSD, depression with recent  suicidal thoughts: Continue psychotropics.  Appreciate input from psychiatrist. Clonidine  and prazosin  for PTSD on hold because of hypotension   Seizure disorder: Continue Depakote  and Keppra    Comorbidities include  intellectual disability, hypothyroidism, GERD, iron  deficiency anemia,   Diet Order             Diet Heart Room service appropriate? Yes; Fluid consistency: Thin  Diet effective now                                  Consultants: Psychiatrist Pulmonologist  Procedures: None    Medications:    apixaban   5 mg Oral BID   benztropine   0.5 mg Oral Daily   And   benztropine   1 mg Oral QHS   dextromethorphan -guaiFENesin   1 tablet Oral BID   divalproex   1,000 mg Oral BID   escitalopram   10 mg Oral Daily   ferrous sulfate   325 mg Oral Q breakfast   folic acid   1 mg Oral Daily   levETIRAcetam   500 mg Oral BID   levothyroxine   50 mcg Oral Q0600   LORazepam   0.5 mg Oral QHS   midodrine   5 mg Oral TID WC   pantoprazole   80 mg Oral Daily   polyethylene glycol  17 g Oral Daily   QUEtiapine   12.5 mg Oral QHS   trazodone   75 mg Oral QHS   Xanomeline-Trospium  Chloride  1 capsule Oral BID   Continuous Infusions:   Anti-infectives (From admission, onward)    Start     Dose/Rate Route Frequency Ordered Stop   12/20/23 1000  azithromycin  (ZITHROMAX ) tablet 500 mg  Status:  Discontinued        500 mg Oral Daily 12/19/23 0944 12/20/23 0947   12/18/23 1000  cefTRIAXone  (ROCEPHIN ) 2 g in sodium chloride  0.9 % 100 mL IVPB        2 g 200 mL/hr over 30 Minutes Intravenous Every 24 hours 12/17/23 1157 12/22/23 1001   12/17/23 1200  azithromycin  (ZITHROMAX ) 500 mg in sodium chloride  0.9 % 250 mL IVPB        500 mg 250 mL/hr over 60 Minutes Intravenous Every 24 hours 12/17/23 1157 12/19/23 1356   12/17/23 1015  cefTRIAXone  (ROCEPHIN ) 1 g in sodium chloride  0.9 % 100 mL IVPB        1 g 200 mL/hr over 30 Minutes Intravenous  Once 12/17/23 1012 12/17/23 1118   12/17/23 1015  doxycycline  (VIBRAMYCIN ) 100 mg in sodium chloride  0.9 % 250 mL IVPB        100 mg 125 mL/hr over 120 Minutes Intravenous  Once 12/17/23 1012 12/17/23 1341              Family  Communication/Anticipated D/C date and plan/Code Status   DVT prophylaxis:  apixaban  (ELIQUIS ) tablet 5 mg     Code Status: Full Code  Family Communication: None Disposition Plan: Plan to discharge to SNF   Status is: Inpatient Remains inpatient appropriate because: Awaiting placement to SNF       Subjective:   Interval events noted.  No complaints.  She is asking to go to the group home although she had previously stated that she did not want to go back there.  Objective:    Vitals:   01/01/24 1736 01/01/24 1927 01/02/24 0444 01/02/24 0738  BP: (!) 100/59 99/75 92/64  102/63  Pulse: 92 93 98   Resp:  18 19 17  Temp:  98.1 F (36.7 C) 97.7 F (36.5 C) 98.3 F (36.8 C)  TempSrc:  Oral Oral Oral  SpO2: 96% 92% 95% 93%  Weight:      Height:       No data found.   Intake/Output Summary (Last 24 hours) at 01/02/2024 1504 Last data filed at 01/01/2024 1908 Gross per 24 hour  Intake 240 ml  Output --  Net 240 ml    Filed Weights   12/16/23 1322 12/24/23 0500  Weight: (!) 137.9 kg (!) 141.4 kg    Exam:  GEN: NAD SKIN: Warm and dry EYES: No pallor or icterus ENT: MMM CV: RRR PULM: CTA B ABD: soft, ND, NT, +BS CNS: Drowsy but arousable, non focal EXT: No edema or tenderness PSYCH: Calm and cooperative      Data Reviewed:   I have personally reviewed following labs and imaging studies:  Labs: Labs show the following:   Basic Metabolic Panel: Recent Labs  Lab 12/27/23 0503 12/30/23 0409  NA 136 137  K 4.6 4.5  CL 100 103  CO2 31 28  GLUCOSE 96 94  BUN 14 15  CREATININE 1.00 0.91  CALCIUM 9.5 8.7*  MG  --  2.1  PHOS  --  3.9   GFR Estimated Creatinine Clearance: 132.2 mL/min (by C-G formula based on SCr of 0.91 mg/dL). Liver Function Tests: Recent Labs  Lab 12/30/23 0409  AST 14*  ALT 16  ALKPHOS 45  BILITOT 0.2  PROT 6.1*  ALBUMIN 3.0*   No results for input(s): LIPASE, AMYLASE in the last 168 hours. No results for  input(s): AMMONIA in the last 168 hours. Coagulation profile No results for input(s): INR, PROTIME in the last 168 hours.  CBC: Recent Labs  Lab 12/27/23 0503 12/30/23 0409  WBC 8.5 8.2  HGB 12.6 11.4*  HCT 40.8 38.1  MCV 81.1 84.1  PLT 201 173   Cardiac Enzymes: No results for input(s): CKTOTAL, CKMB, CKMBINDEX, TROPONINI in the last 168 hours. BNP (last 3 results) Recent Labs    12/22/23 1221  PROBNP <50.0   CBG: No results for input(s): GLUCAP in the last 168 hours. D-Dimer: No results for input(s): DDIMER in the last 72 hours. Hgb A1c: No results for input(s): HGBA1C in the last 72 hours. Lipid Profile: No results for input(s): CHOL, HDL, LDLCALC, TRIG, CHOLHDL, LDLDIRECT in the last 72 hours. Thyroid function studies: No results for input(s): TSH, T4TOTAL, T3FREE, THYROIDAB in the last 72 hours.  Invalid input(s): FREET3 Anemia work up: No results for input(s): VITAMINB12, FOLATE, FERRITIN, TIBC, IRON , RETICCTPCT in the last 72 hours. Sepsis Labs: Recent Labs  Lab 12/27/23 0503 12/30/23 0409  WBC 8.5 8.2    Microbiology No results found for this or any previous visit (from the past 240 hours).  Procedures and diagnostic studies:  No results found.             LOS: 16 days   Ishan Sanroman  [facility] on [email or web address]. If 7PM-7AM, please contact night-coverage at [email or web address]     01/02/2024, 3:04 PM           "

## 2024-01-03 DIAGNOSIS — E662 Morbid (severe) obesity with alveolar hypoventilation: Secondary | ICD-10-CM | POA: Diagnosis present

## 2024-01-03 DIAGNOSIS — B348 Other viral infections of unspecified site: Secondary | ICD-10-CM | POA: Diagnosis not present

## 2024-01-03 MED ORDER — TRAZODONE HCL 50 MG PO TABS: 75.0000 mg | ORAL_TABLET | Freq: Every day | ORAL | 0 refills | Status: AC

## 2024-01-03 MED ORDER — MIDODRINE HCL 5 MG PO TABS: 5.0000 mg | ORAL_TABLET | Freq: Three times a day (TID) | ORAL | 0 refills | Status: AC

## 2024-01-03 MED ORDER — LORAZEPAM 1 MG PO TABS: 0.5000 mg | ORAL_TABLET | Freq: Every day | ORAL | Status: AC

## 2024-01-03 MED ORDER — QUETIAPINE FUMARATE 25 MG PO TABS: 12.5000 mg | ORAL_TABLET | Freq: Every day | ORAL | 0 refills | Status: AC

## 2024-01-03 MED ORDER — FERROUS SULFATE 325 (65 FE) MG PO TABS: 325.0000 mg | ORAL_TABLET | ORAL | 0 refills | Status: AC

## 2024-01-03 NOTE — Progress Notes (Signed)
 "    Progress Note    Sherry Bryan  FMW:[id number] DOB: Dec 13, 1995  DOA: 12/16/2023 PCP: Pcp, No      Brief Narrative:    Medical records reviewed and are as summarized below:  Sherry Bryan is a 28 y.o. female with past medical history of hypertension, hypothyroidism, seizure disorder, GERD, prior blood clots on Eliquis , major depressive disorder, DMDD, intellectual disability, borderline personality disorder, PTSD, and adjustment disorder.  She presented to [facility] 12/14/2023 with reports of suicidal ideation and productive cough.  Ultimately she was medically cleared and she remained in the ED pending psychiatric evaluation.  On the morning of 12/17/2023, she reported cough productive of greenish sputum, shortness of breath and chest pain.  She said she had been coughing for about a week.  She has not had any known sick contacts.  ED Course: Vitals on 12/17/2023-temp 38.6 C, BP 98/71 and as low as 89/53 with MAP of 63, RR 21, SpO2 89% on room air and improving to 93% on 4 L via nasal cannula. CT head obtained and shows no acute intracranial abnormality with mucosal thickening and air-fluid levels in bilateral maxillary ethmoid sinuses and left sphenoid sinus.  CTA PE obtained and negative for PE, did show ground glass and streaky opacities in the lower lobes bilaterally.   TRH was contacted for admission.       Assessment/Plan:   Principal Problem:   Rhinovirus infection Active Problems:   Hx of seizure disorder   Hx of gastroesophageal reflux (GERD)   Intellectual disability   Suicidal ideations   Depression   Acute hypoxic respiratory failure (HCC)   Hypothyroidism   Essential hypertension   Morbid obesity (HCC)   Schizoaffective disorder (HCC)   PTSD (post-traumatic stress disorder)   Obesity hypoventilation syndrome (HCC)    Body mass index is 53.51 kg/m.  (Class III obesity)   Rhinovirus infection, superimposed bacterial pneumonia: Completed  5 days of ceftriaxone  and azithromycin . Respiratory viral panel positive for rhinovirus.   Acute on chronic hypoxic respiratory failure with hypercapnia, obesity hypoventilation syndrome: Oxygen saturation with ambulation is normal.  However, patient had nocturnal oxygen desaturation based on overnight oximetry report and she will need oxygen at night. She will also need trilogy for use at night for OHS. Transition of care team is working on this.  Due to the severity of the patient's chronic respiratory failure, secondary to OHS and Restrictive Thoracic Disorder, the patient requires volume targeted pressure support from Home Mechanical Ventilation. BIPAP, BIPAP ST/ STA (RAD) have been ruled out. RAD with or without back up rate does not supply volume targeted ventilation to manage the patients Hypercapnia. OSA is not the predominant cause of their hypercapnia, and HMV is not being ordered to treat OSA. The patient requires ventilatory support for more than eight hours per 24-hour period to normalize the patient's PaCO2 and prevent readmissions. Failure to adequately ventilate will result in serious harm and/or death to the patient.   Recurrent hypotension: Continue midodrine  Clonidine , HCTZ, Toprol -XL and prazosin  on hold   Schizoaffective disorder, PTSD, depression with recent suicidal thoughts: Continue psychotropics.  Appreciate input from psychiatrist. Clonidine  and prazosin  for PTSD on hold because of hypotension   Seizure disorder: Continue Depakote  and Keppra    Comorbidities include intellectual disability, hypothyroidism, GERD, iron  deficiency anemia,   Diet Order             Diet Heart Room service appropriate? Yes; Fluid consistency: Thin  Diet effective now  Consultants: Psychiatrist Pulmonologist  Procedures: None    Medications:    apixaban   5 mg Oral BID   benztropine   0.5 mg Oral Daily   And    benztropine   1 mg Oral QHS   dextromethorphan -guaiFENesin   1 tablet Oral BID   divalproex   1,000 mg Oral BID   escitalopram   10 mg Oral Daily   ferrous sulfate   325 mg Oral Q breakfast   folic acid   1 mg Oral Daily   levETIRAcetam   500 mg Oral BID   levothyroxine   50 mcg Oral Q0600   LORazepam   0.5 mg Oral QHS   midodrine   5 mg Oral TID WC   pantoprazole   80 mg Oral Daily   polyethylene glycol  17 g Oral Daily   QUEtiapine   12.5 mg Oral QHS   trazodone   75 mg Oral QHS   Xanomeline-Trospium  Chloride  1 capsule Oral BID   Continuous Infusions:   Anti-infectives (From admission, onward)    Start     Dose/Rate Route Frequency Ordered Stop   12/20/23 1000  azithromycin  (ZITHROMAX ) tablet 500 mg  Status:  Discontinued        500 mg Oral Daily 12/19/23 0944 12/20/23 0947   12/18/23 1000  cefTRIAXone  (ROCEPHIN ) 2 g in sodium chloride  0.9 % 100 mL IVPB        2 g 200 mL/hr over 30 Minutes Intravenous Every 24 hours 12/17/23 1157 12/22/23 1001   12/17/23 1200  azithromycin  (ZITHROMAX ) 500 mg in sodium chloride  0.9 % 250 mL IVPB        500 mg 250 mL/hr over 60 Minutes Intravenous Every 24 hours 12/17/23 1157 12/19/23 1356   12/17/23 1015  cefTRIAXone  (ROCEPHIN ) 1 g in sodium chloride  0.9 % 100 mL IVPB        1 g 200 mL/hr over 30 Minutes Intravenous  Once 12/17/23 1012 12/17/23 1118   12/17/23 1015  doxycycline  (VIBRAMYCIN ) 100 mg in sodium chloride  0.9 % 250 mL IVPB        100 mg 125 mL/hr over 120 Minutes Intravenous  Once 12/17/23 1012 12/17/23 1341              Family Communication/Anticipated D/C date and plan/Code Status   DVT prophylaxis:  apixaban  (ELIQUIS ) tablet 5 mg     Code Status: Full Code  Family Communication: None Disposition Plan: Plan to discharge to group home   Status is: Inpatient Remains inpatient appropriate because: Awaiting DME trilogy machine prior to discharge.       Subjective:   Interval events noted.  No complaints.  She  requested discharge to home today.  Objective:    Vitals:   01/02/24 2007 01/03/24 0513 01/03/24 0740 01/03/24 1245  BP: 110/71 (!) 94/53 (!) 105/59 113/72  Pulse: 74 67 74 88  Resp: 16 20 19    Temp: 98.4 F (36.9 C) 97.6 F (36.4 C) 98.4 F (36.9 C)   TempSrc: Oral Oral Oral   SpO2: 97% 90% 93%   Weight:      Height:       No data found.   Intake/Output Summary (Last 24 hours) at 01/03/2024 1402 Last data filed at 01/03/2024 0900 Gross per 24 hour  Intake 240 ml  Output --  Net 240 ml    Filed Weights   12/16/23 1322 12/24/23 0500  Weight: (!) 137.9 kg (!) 141.4 kg    Exam:  GEN: NAD SKIN: Warm and dry EYES: No pallor or icterus ENT: MMM  CV: RRR PULM: CTA B ABD: soft, obese, NT, +BS CNS: AAO x 3, non focal EXT: No edema or tenderness Psych: No delusions or hallucinations.  Calm and cooperative.  Not suicidal.       Data Reviewed:   I have personally reviewed following labs and imaging studies:  Labs: Labs show the following:   Basic Metabolic Panel: Recent Labs  Lab 12/30/23 0409  NA 137  K 4.5  CL 103  CO2 28  GLUCOSE 94  BUN 15  CREATININE 0.91  CALCIUM 8.7*  MG 2.1  PHOS 3.9   GFR Estimated Creatinine Clearance: 132.2 mL/min (by C-G formula based on SCr of 0.91 mg/dL). Liver Function Tests: Recent Labs  Lab 12/30/23 0409  AST 14*  ALT 16  ALKPHOS 45  BILITOT 0.2  PROT 6.1*  ALBUMIN 3.0*   No results for input(s): LIPASE, AMYLASE in the last 168 hours. No results for input(s): AMMONIA in the last 168 hours. Coagulation profile No results for input(s): INR, PROTIME in the last 168 hours.  CBC: Recent Labs  Lab 12/30/23 0409  WBC 8.2  HGB 11.4*  HCT 38.1  MCV 84.1  PLT 173   Cardiac Enzymes: No results for input(s): CKTOTAL, CKMB, CKMBINDEX, TROPONINI in the last 168 hours. BNP (last 3 results) Recent Labs    12/22/23 1221  PROBNP <50.0   CBG: No results for input(s): GLUCAP in the last  168 hours. D-Dimer: No results for input(s): DDIMER in the last 72 hours. Hgb A1c: No results for input(s): HGBA1C in the last 72 hours. Lipid Profile: No results for input(s): CHOL, HDL, LDLCALC, TRIG, CHOLHDL, LDLDIRECT in the last 72 hours. Thyroid function studies: No results for input(s): TSH, T4TOTAL, T3FREE, THYROIDAB in the last 72 hours.  Invalid input(s): FREET3 Anemia work up: No results for input(s): VITAMINB12, FOLATE, FERRITIN, TIBC, IRON , RETICCTPCT in the last 72 hours. Sepsis Labs: Recent Labs  Lab 12/30/23 0409  WBC 8.2    Microbiology No results found for this or any previous visit (from the past 240 hours).  Procedures and diagnostic studies:  No results found.             LOS: 17 days   Renelda Kilian  [employer] on [email or web address]. If 7PM-7AM, please contact night-coverage at [email or web address]     01/03/2024, 2:02 PM           "

## 2024-01-03 NOTE — Progress Notes (Signed)
 Occupational Therapy Treatment Patient Details Name: Sherry Bryan MRN: [id number] DOB: 12-12-1995 Today's Date: 01/03/2024   History of present illness Pt is a 28 year old female admitted with sepsis due to pneumonia, acute hypoxic respiratory failure; of note, pt presented to ED with suicidal ideations, Patient currently has no suicidal ideations    PMH significant for  hypertension, hypothyroidism, seizure disorder, GERD, prior blood clots on Eliquis , major depressive disorder, DMDD, intellectual disability, borderline personality disorder, PTSD, and adjustment disorder   OT comments  Patient seen for OT treatment on this date. Upon arrival to room patient resting in bed, agreeable to treatment. Patient on RA throughout entire tx. O2 remained 92% and higher while in supine, sitting EOB and ambulating 100 feet with SBA. Patients HR did increase to 135 but patient reports I feel good.  OT educated on energy conservation and fall prevention strategies to implement into ADL routine, she reports staff at group home can assist her. Patient ended treatment in bed with bed/chair alarm on and all needs within reach. Patient making good progress toward goals, will continue to follow POC. Discharge recommendation remains appropriate.        If plan is discharge home, recommend the following:  A little help with walking and/or transfers;A little help with bathing/dressing/bathroom;Direct supervision/assist for medications management;Direct supervision/assist for financial management   Equipment Recommendations  BSC/3in1    Recommendations for Other Services      Precautions / Restrictions Precautions Precautions: Fall Recall of Precautions/Restrictions: Impaired Precaution/Restrictions Comments: monitor O2 Restrictions Weight Bearing Restrictions Per Provider Order: No       Mobility Bed Mobility Overal bed mobility: Modified Independent Bed Mobility: Supine to Sit, Sit to Supine      Supine to sit: Contact guard Sit to supine: Modified independent (Device/Increase time)        Transfers Overall transfer level: Needs assistance Equipment used: None Transfers: Sit to/from Stand Sit to Stand: Supervision, Contact guard assist           General transfer comment: supervision/CGA     Balance Overall balance assessment: Needs assistance Sitting-balance support: Feet supported Sitting balance-Leahy Scale: Good Sitting balance - Comments: sat EOB without assist but cueing was needed Postural control: Posterior lean Standing balance support: During functional activity, No upper extremity supported Standing balance-Leahy Scale: Fair Standing balance comment: close supervision/SBA in stance                           ADL either performed or assessed with clinical judgement   ADL Overall ADL's : Needs assistance/impaired                 Upper Body Dressing : Set up;Standing                     General ADL Comments: close supervision required while in stance due to balance deficits/lethargy    Extremity/Trunk Assessment              Vision       Perception     Praxis     Communication Communication Communication: Impaired Factors Affecting Communication: Difficulty expressing self   Cognition Arousal: Lethargic, Alert Behavior During Therapy: Flat affect Cognition: History of cognitive impairments, Cognition impaired, No family/caregiver present to determine baseline  Following commands: Impaired Following commands impaired: Follows one step commands with increased time      Cueing   Cueing Techniques: Verbal cues, Tactile cues  Exercises      Shoulder Instructions       General Comments      Pertinent Vitals/ Pain       Pain Assessment Pain Assessment: No/denies pain  Home Living                                          Prior  Functioning/Environment              Frequency  Min 2X/week        Progress Toward Goals  OT Goals(current goals can now be found in the care plan section)  Progress towards OT goals: Progressing toward goals     Plan      Co-evaluation                 AM-PAC OT 6 Clicks Daily Activity     Outcome Measure   Help from another person eating meals?: None Help from another person taking care of personal grooming?: A Little Help from another person toileting, which includes using toliet, bedpan, or urinal?: A Little Help from another person bathing (including washing, rinsing, drying)?: A Little Help from another person to put on and taking off regular upper body clothing?: A Little Help from another person to put on and taking off regular lower body clothing?: A Little 6 Click Score: 19    End of Session Equipment Utilized During Treatment: Gait belt  OT Visit Diagnosis: Other abnormalities of gait and mobility (R26.89)   Activity Tolerance Patient tolerated treatment well   Patient Left in bed;with call bell/phone within reach;with bed alarm set   Nurse Communication Mobility status        Time: 8892-8871 OT Time Calculation (min): 21 min  Charges: OT General Charges $OT Visit: 1 Visit OT Treatments $Therapeutic Exercise: 8-22 mins  Rogers Clause, OT/L MSOT, 01/03/2024

## 2024-01-03 NOTE — Plan of Care (Signed)

## 2024-01-03 NOTE — TOC CM/SW Note (Signed)
 Patient is not able to walk the distance required to go the bathroom, or he/she is unable to safely negotiate stairs required to access the bathroom.  A 3in1 BSC will alleviate this problem

## 2024-01-03 NOTE — TOC Progression Note (Signed)
 Transition of Care [facility]) - Progression Note    Patient Details  Name: Sherry Bryan MRN: [id number] Date of Birth: 01-29-1995  Transition of Care [facility]) CM/SW Contact  Daved JONETTA Hamilton, RN Phone Number: 01/03/2024, 4:57 PM  Clinical Narrative:     LATE ENTRY  Clinical documents have been sent to [facility] with [employer] for NIV approval, anticipate approval notification in the AM. Patient qualifies for nocturnal O2, provider notified to place order if inclined. Once NIV is approved set up can proceed.   Patient discharge anticipated for 01/04/24 however, this CM spoke with [name] at [facility] and she advised that they are unable to take patient tomorrow, can accept patient back on Friday 01/05/24.  Medications are to be sent to [facility] phone [phone], Fax [phone]                      Expected Discharge Plan and Services         Expected Discharge Date: 12/30/23                                     Social Drivers of Health (SDOH) Interventions SDOH Screenings   Food Insecurity: No Food Insecurity (12/19/2023)  Housing: Patient Unable To Answer (12/20/2023)  Transportation Needs: Patient Unable To Answer (12/20/2023)  Utilities: Not At Risk (12/19/2023)  Tobacco Use: Medium Risk (12/16/2023)    Readmission Risk Interventions     No data to display

## 2024-01-04 DIAGNOSIS — F79 Unspecified intellectual disabilities: Secondary | ICD-10-CM | POA: Diagnosis not present

## 2024-01-04 DIAGNOSIS — B348 Other viral infections of unspecified site: Secondary | ICD-10-CM | POA: Diagnosis not present

## 2024-01-04 DIAGNOSIS — J9621 Acute and chronic respiratory failure with hypoxia: Secondary | ICD-10-CM | POA: Diagnosis not present

## 2024-01-04 DIAGNOSIS — F259 Schizoaffective disorder, unspecified: Secondary | ICD-10-CM

## 2024-01-04 DIAGNOSIS — J9622 Acute and chronic respiratory failure with hypercapnia: Secondary | ICD-10-CM | POA: Diagnosis not present

## 2024-01-04 NOTE — TOC Progression Note (Signed)
 Transition of Care [facility]) - Progression Note    Patient Details  Name: Sherry Bryan MRN: [id number] Date of Birth: 03-07-1995  Transition of Care [facility]) CM/SW Contact  Nathanael CHRISTELLA Ring, RN Phone Number: 01/04/2024, 9:31 AM  Clinical Narrative:     Received a call from Mitch with [employer], the NIV has been approved and the patient also qualifies for nocturnal oxygen.  Thomasina will let me know when the respiratory therapist will be able to come over to the hospital with the NIV. CM will try to get someone from the group home to come for education.    Spoke with Latoya from the group home, she reports that most of the staff is off for Christmas but will be back tomorrow and it would be better if they could come for NIV education tomorrow, since that will be the staff taking care of her.  She also agreed to OP PT since no [facility] agency could be found to accept the [employer].  She says that here at [facility] would be fine.    Group home staff can be here at 10 am tomorrow, notified Mitch with [employer].  Also patient will need a RW and wheelchair.                    Expected Discharge Plan and Services         Expected Discharge Date: 12/30/23                                     Social Drivers of Health (SDOH) Interventions SDOH Screenings   Food Insecurity: No Food Insecurity (12/19/2023)  Housing: Patient Unable To Answer (12/20/2023)  Transportation Needs: Patient Unable To Answer (12/20/2023)  Utilities: Not At Risk (12/19/2023)  Tobacco Use: Medium Risk (12/16/2023)    Readmission Risk Interventions     No data to display

## 2024-01-04 NOTE — Plan of Care (Signed)

## 2024-01-04 NOTE — Consult Note (Signed)
 [place] [facility] Consult Follow Up  Patient Name: .Sherry Bryan  MRN: [id number]  DOB: 1995-10-13  Consult Order details:  Orders (From admission, onward)     Start     Ordered   12/16/23 1406  IP CONSULT TO PSYCHIATRY       Ordering Provider: Willo Dunnings, MD  Provider:  (Not yet assigned)  Question:  Reason for consult:  Answer:  Medication management   12/16/23 1406   12/16/23 1406  CONSULT TO CALL [facility]       Ordering Provider: Willo Dunnings, MD  Provider:  (Not yet assigned)  Question:  Reason for Consult?  Answer:  SI   12/16/23 1406             Mode of Visit: In person    Psychiatry Consult Evaluation  Service Date: January 04, 2024 LOS:  LOS: 18 days  Chief Complaint I ran off  Primary Psychiatric Diagnoses   Suicidal ideations   Assessment   Sherry Bryan is a 28 y.o. female admitted: Presented to the EDfor 12/16/2023  1:53 PM for suicidal ideation . She carries the psychiatric diagnoses of intellectual disability, borderline personality disorder, and PTSD  and has a past medical history of  blood clots on Eliquis , seizures.  01/04/2024: Psychiatry attempted to see patient on rounds today. Patient was noted to be sleeping in bed when this provider entered the room and was not disturbed at this time. Psychiatry will continue to be available as needed while patient remains medically admitted.  01/02/2024:  Patient was seen today during psychiatric rounds. Patient is alert and oriented X 4, calm, cooperative, and engages well in the interview.  On today's exam, patient denied suicidal or homicidal ideations.  Patient also denied auditory or visual hallucinations as well.  Patient reported tolerating current medications well and denied any noted side effects.  Patient reported I feel a lot better.  Patient reported eating and sleeping well.  Patient denied any acute concerns on today's rounds.On current presentation there was no evidence of  psychosis and patient did not appear to be responding to internal stimuli.  12/31/2023: Patient was seen today during psychiatric rounds. Patient is alert and oriented X 4, calm, cooperative, and engages well in the interview. Patient reports she does not want to return back to her group home. She states, they treat me bad there, and I am going to hurt myself if I go back. Patient reports that she prefers to be admitted to a new group home at discharge. Patient endorses having depression and anxiety thinking about returning back to her current group home. She rates both the depression and anxiety as a 10/10. Patient reports she is sleeping and eating well. She denies having any suicidal or homicidal ideations, or perceptual disturbances. Patient has not displayed any unsafe behaviors and has been compliant with her medications and care. Will continue to round to assess patient's mental health needs and provide guidance and support.   12/30/2023: Patient was seen this morning during psychiatric rounds. Patient is alert and oriented X 3, calm, drowsy, and difficult to engage in conversation. Patient is observed excessively sleepy with her eyes closed and unable to provide meaningful responses to the questions being asked. Patient informed that this provider will attempt to visit her at a later time and her eyes remained closed. Patient will continue to be monitored for unsafe behaviors and be provided safety per hospital protocol.   On assessment today, patient only participated minimally stating  she did not feel physically well.  Current exam was limited due to this.  Patient was just seen here yesterday for suicidal ideation.  Patient reported running off from her group home due to not feeling safe.  When asked to elaborate, patient stated well I miss my mom and dad and its close to the holidays and it always gets worse.  Patient denied that anyone was physically or emotionally harming her at the group  home.  As stated in triage note, patient endorsed suicidal thoughts with plan but denied intent. This is a chronic presentation for patient.   Patient denied homicidal ideation.  Patient denied auditory or visual hallucinations as well.  Patient reported being compliant with current medication regimen. Patient is established with outpatient providers.  This provider attempted to contact group home and legal guardian.  No answer at this time, HIPAA compliant voicemail left for legal guardian requesting return call.  Patient will be observed overnight to ensure safety and monitor for any unsafe behaviors and allow for psychiatry team to get into contact with both group home and legal guardian.  12/17/2023: Patient seen on rounds today by psychiatry.  Patient reported still feeling not good when referring to her physical health.  Patient still endorsed suicidal thoughts, but reported today that her plan was to take her medications.  Patient currently resides at a group home and medications are given by group home staff.  Patient does not have current access to her medications as they are given by staff.  Upon further questioning, patient reported she feels as though she always misses her parents more around the holidays and it is hard for her to handle.  Patient denied that she had ever spoken with a therapist about these concerns.  Patient did report that she would be willing to speak with a therapist about these things if she had one.  Patient reported she felt as though this would help.  Patient denied homicidal ideations.  Patient denied auditory or visual hallucinations as well.  Patient has continued to maintain safe behaviors while in the emergency department.  Per nursing staff and review of chart, patient has displayed no self-harm behaviors.  It does not appear that patient has required any IM agitation medications while holding in the emergency room. Although the patient endorses chronic suicidal ideation  with an identified plan, they currently reside in a structured group home environment and have historically demonstrated the ability to maintain safety within that setting. The support and supervision available in the group home appear sufficient for ongoing community safety.  We will recommend that patient be established with therapy services along with her medication management team that sees her in the group home.  At this time, psychiatry will sign off.  Patient is currently being medically admitted.  Please reach out for any new concerns or questions.  12/26/2023: Psychiatry was reconsulted by the hospital team due to concerns regarding polypharmacy and increased lethargy in the patient. On today's examination, the patient was alert and oriented x4 but was observed to fall asleep intermittently during the assessment, requiring redirection to continue the interview. When awake, she provided appropriate responses to questions. The patient denied experiencing excessive sleepiness or daytime drowsiness prior to her current medical illness and reported no recent medication adjustments, though she is noted to be a poor historian and does have a legal guardian. Review of her medication regimen reveals she is prescribed Lexapro  10 mg daily, Cogentin  1 mg twice daily, Seroquel  XR, trazodone ,  Uzedy  LAI and Cobenfy .  Reference medical team note for other medications that have been held for medical indications including clonidine , prazosin  and decreasing daily Ativan  dose.  The medical team has already initiated downward titration of her Seroquel  from baseline dose (50 mg nightly) to 12.5 mg nightly and trazodone  to 75 mg nightly (originally 300 mg nightly) given concerns for sedation. The patient currently denies suicidal and homicidal ideations as well as auditory or visual hallucinations. On current presentation there was no evidence of psychosis and patient did not appear to be responding to internal stimuli.When  asked about previous movement disorders or indications for her current Cogentin  therapy, the patient was unable to provide reliable history. No extrapyramidal symptoms were noted on today's examination, though assessment is limited given her acute medical illness and current need for high-flow nasal cannula. This provider discussed with the medical team that Cogentin  may be contributing to the patient's increased lethargy and recommended considering downward titration in 0.5 mg increments to assess for improvement in alertness. It was also noted that the patient's current acute medical status could be contributing to her lethargic presentation as well. The patient reports she is followed by [facility] for outpatient psychiatric care. Psychiatry will remain available for medication management consultation as needed during her medical admission. The patient does not require acute psychiatric inpatient admission at this time.While future psychiatric events cannot be accurately predicted, the patient does not currently require acute inpatient psychiatric care and does not currently meet [place]  involuntary commitment criteria.   12/18: Patient was rounded on today by psychiatry.  Patient reported doing a little better.  Patient today endorsed suicidal ideation without plan or intent, consistent with chronic baseline presentation. When asked to elaborate on current stressors, patient reports significant dissatisfaction with current group home placement, describing staff as terrible, rude, and disrespectful. Patient identifies this as major contributor to distress. Patient has resided at current group home for approximately one year.  Of note, patient has historically maintained safety in the community under group home supervision despite chronic baseline suicidal ideation. Patient has demonstrated safe behaviors throughout current hospitalization.  Patient denies homicidal ideation. When initially  asked about hallucinations, patient reported seeing her real mom,however, upon clarification, patient describes these experiences occur during sleep and are dream-like in nature rather than true perceptual disturbances. Patient presenting with chronic suicidal ideation without acute change in safety risk. Current distress appears situational, related to dissatisfaction with group home environment. Patient has demonstrated capacity to maintain safety in supervised community setting and continues to display safe behaviors on unit. No evidence of acute psychosis or perceptual disturbances on current assessment.     Diagnoses:  Active Hospital problems: Principal Problem:   Rhinovirus infection Active Problems:   Hx of seizure disorder   Hx of gastroesophageal reflux (GERD)   Intellectual disability   Suicidal ideations   Depression   Acute on chronic respiratory failure with hypoxia and hypercapnia (HCC)   Hypothyroidism   Essential hypertension   Morbid obesity (HCC)   Schizoaffective disorder (HCC)   PTSD (post-traumatic stress disorder)   Obesity hypoventilation syndrome (HCC)    Plan   ## Psychiatric Medication Recommendations:  See above under assessment portion of note  ## Medical Decision Making Capacity: Patient has a guardian and has thus been adjudicated incompetent; please involve patients guardian in medical decision making  ## Further Work-up:   -- most recent EKG on 12/16/2023 had QtC of 444    ## Disposition:--There are no psychiatric  contraindications to discharge  ## Behavioral / Environmental: -Utilize compassion and acknowledge the patient's experiences while setting clear and realistic expectations for care.    ## Safety and Observation Level:  - Based on my clinical evaluation, I estimate the patient to be at low risk of self harm in the current setting. Patient chronically at an elevated risk due to history. Unit can continue with protcol safety checks -  At this time, we recommend  routine. This decision is based on my review of the chart including patient's history and current presentation, interview of the patient, mental status examination, and consideration of suicide risk including evaluating suicidal ideation, plan, intent, suicidal or self-harm behaviors, risk factors, and protective factors. This judgment is based on our ability to directly address suicide risk, implement suicide prevention strategies, and develop a safety plan while the patient is in the clinical setting. Please contact our team if there is a concern that risk level has changed.   Suicide Risk Assessment: Patient has following modifiable risk factors for suicide: N/A which we are addressing by utilizing therapeutic communication to give patient safe space to discuss current emotional status. Patient has following non-modifiable or demographic risk factors for suicide: psychiatric hospitalization Patient has the following protective factors against suicide: Access to outpatient mental health care  Thank you for this consult request. Recommendations have been communicated to the primary team.  We will be available as needed at this time.   Zelda Sharps, NP        History of Present Illness  Relevant Aspects of [facility]   Patient Report:  Collected on initial interview with patient on initial presentation to hospital: On assessment today, patient only participated minimally stating she did not feel physically well.  Current exam was limited due to this.  Patient was just seen here yesterday for suicidal ideation.  Patient reported running off from her group home due to not feeling safe.  When asked to elaborate, patient stated well I miss my mom and dad and its close to the holidays and it always gets worse.  Patient denied that anyone was physically or emotionally harming her at the group home.  As stated in triage note, patient endorsed suicidal thoughts with  plan but denied intent. This is a chronic presentation for patient.   Patient denied homicidal ideation.  Patient denied auditory or visual hallucinations as well.  Patient reported being compliant with current medication regimen. Patient is established with outpatient providers.  This provider attempted to contact group home and legal guardian.  No answer at this time, HIPAA compliant voicemail left for legal guardian requesting return call.  Patient will be observed overnight to ensure safety and monitor for any unsafe behaviors and allow for psychiatry team to get into contact with both group home and legal guardian.   See updated information under assessment portion of note  Psych ROS:  Depression: Denied Anxiety:  Denied Mania (lifetime and current): Denied Psychosis: (lifetime and current): Denied  Collateral information:  Attempted to contact legal guardian and group home-no answer at this time.      Psychiatric and Social History  Psychiatric History:  Information collected from Patient/chart review  Prev Dx/Sx: Bipolar disorder, Major depressive disorder, and Generalized anxiety disorder , IDD Current Psych Provider: [facility]  Home Meds (current): patient unsure Previous Med Trials: Invega, Haldol, Zyprexa , Abilify, Vraylar, Rexulti per patient report  Therapy: Unknown  Prior Psych Hospitalization: Yes  Prior Self Harm: Yes Prior Violence: Yes  Family Psych History: Patient reports family history of bipolar disorder, ADHD, ODD, and substance abuse in her mother  Family Hx suicide: unknown   Educational Hx: 11th grade Occupational Hx: Unemployed, receives disability Legal Hx: Unknown Living Situation: Group Home Access to weapons/lethal means: Denies   Substance History Alcohol: Denies Tobacco: Patient reports smoking 2 cigarettes/day and vaping Illicit drugs: Denies Prescription drug abuse: Denies Rehab hx: Denies  Exam Findings  Physical Exam: Reviewed  and agree with the physical exam findings conducted by the medical provider Vital Signs:  Temp:  [98.2 F (36.8 C)-98.9 F (37.2 C)] 98.7 F (37.1 C) (12/25 0738) Pulse Rate:  [88-106] 89 (12/25 0738) Resp:  [18-20] 19 (12/25 0738) BP: (87-113)/(55-72) 87/59 (12/25 0738) SpO2:  [91 %-97 %] 91 % (12/25 0738) Blood pressure (!) 87/59, pulse 89, temperature 98.7 F (37.1 C), temperature source Oral, resp. rate 19, height 5' 4 (1.626 m), weight (!) 141.4 kg, SpO2 91%. Body mass index is 53.51 kg/m.    Mental Status Exam: General Appearance: Fairly Groomed  Orientation:  Full (Time, Place, and Person)  Memory:  Fair  Concentration:  Fair  Recall:  Fair  Attention  Fair  Eye Contact:  Fair  Speech:  Slow  Language:  Fair  Volume:  Decreased  Mood: Okay  Affect:  Appropriate  Thought Process:  Coherent  Thought Content:  Logical  Suicidal Thoughts:  Denied  Homicidal Thoughts:  No  Judgement:  Poor  Insight:  Lacking  Psychomotor Activity:  WNL  Akathisia:  No  Fund of Knowledge:  Fair      Assets:  Therapist, Sports  Cognition:  Impaired,  Moderate  ADL's: Impaired  AIMS (if indicated):        Other History   These have been pulled in through the EMR, reviewed, and updated if appropriate.  Family History:  The patient's family history is not on file.  Medical History: Past Medical History:  Diagnosis Date   Borderline personality disorder (HCC)    Constipation 05/15/2015   Depression    DMDD (disruptive mood dysregulation disorder) 05/15/2015   History of seasonal allergies 05/15/2015   Hx of gastroesophageal reflux (GERD) 05/15/2015   Hx of seizure disorder 05/15/2015   Intellectual disability 05/22/2015   PTSD (post-traumatic stress disorder)    Seizures (HCC)    last one in 2015   Suicidal ideation     Surgical History: History reviewed. No pertinent surgical history.   Medications:   Current  Facility-Administered Medications:    acetaminophen  (TYLENOL ) tablet 650 mg, 650 mg, Oral, Q6H PRN, 650 mg at 01/02/24 1256 **OR** acetaminophen  (TYLENOL ) suppository 650 mg, 650 mg, Rectal, Q6H PRN, Foust, Katy L, NP   albuterol  (PROVENTIL ) (2.5 MG/3ML) 0.083% nebulizer solution 2.5 mg, 2.5 mg, Nebulization, Q2H PRN, Foust, Katy L, NP, 2.5 mg at 12/24/23 2040   apixaban  (ELIQUIS ) tablet 5 mg, 5 mg, Oral, BID, Jessup, Charles, MD, 5 mg at 01/04/24 9178   benztropine  (COGENTIN ) tablet 0.5 mg, 0.5 mg, Oral, Daily, 0.5 mg at 01/04/24 9178 **AND** benztropine  (COGENTIN ) tablet 1 mg, 1 mg, Oral, QHS, Franchot Novel, MD, 1 mg at 01/03/24 2044   dextromethorphan -guaiFENesin  (MUCINEX  DM) 30-600 MG per 12 hr tablet 1 tablet, 1 tablet, Oral, BID, Franchot Novel, MD, 1 tablet at 01/04/24 0820   divalproex  (DEPAKOTE ) DR tablet 1,000 mg, 1,000 mg, Oral, BID, Jessup, Charles, MD, 1,000 mg at 01/04/24 0820   escitalopram  (LEXAPRO ) tablet 10 mg, 10 mg, Oral,  Daily, Jessup, Charles, MD, 10 mg at 01/04/24 9178   ferrous sulfate  tablet 325 mg, 325 mg, Oral, Q breakfast, Franchot Novel, MD, 325 mg at 01/04/24 9178   folic acid  (FOLVITE ) tablet 1 mg, 1 mg, Oral, Daily, Jessup, Charles, MD, 1 mg at 01/04/24 0820   levETIRAcetam  (KEPPRA ) tablet 500 mg, 500 mg, Oral, BID, Jessup, Charles, MD, 500 mg at 01/04/24 0820   levothyroxine  (SYNTHROID ) tablet 50 mcg, 50 mcg, Oral, Q0600, Dail Rankin RAMAN, RPH, 50 mcg at 01/04/24 9370   LORazepam  (ATIVAN ) tablet 0.5 mg, 0.5 mg, Oral, QHS, Franchot Novel, MD, 0.5 mg at 01/03/24 2043   midodrine  (PROAMATINE ) tablet 5 mg, 5 mg, Oral, TID WC, Jens Durand, MD, 5 mg at 01/04/24 0820   ondansetron  (ZOFRAN ) tablet 4 mg, 4 mg, Oral, Q6H PRN, 4 mg at 01/02/24 1154 **OR** ondansetron  (ZOFRAN ) injection 4 mg, 4 mg, Intravenous, Q6H PRN, Foust, Katy L, NP, 4 mg at 12/28/23 1800   pantoprazole  (PROTONIX ) EC tablet 80 mg, 80 mg, Oral, Daily, Jessup, Charles, MD, 80 mg at 01/04/24  0820   polyethylene glycol (MIRALAX  / GLYCOLAX ) packet 17 g, 17 g, Oral, Daily, Jens Durand, MD, 17 g at 01/04/24 0820   QUEtiapine  (SEROQUEL ) tablet 12.5 mg, 12.5 mg, Oral, QHS, Franchot Novel, MD, 12.5 mg at 01/03/24 2045   senna-docusate (Senokot-S) tablet 1 tablet, 1 tablet, Oral, QHS PRN, Ayiku, Bernard, MD, 1 tablet at 01/02/24 2233   traZODone  (DESYREL ) tablet 75 mg, 75 mg, Oral, QHS, Franchot Novel, MD, 75 mg at 01/03/24 2043   Xanomeline-Trospium  Chloride 125-30 MG CAPS 1 capsule, 1 capsule, Oral, BID, Niels Kayla FALCON, [facility], 1 capsule at 01/04/24 1001  Allergies: Allergies  Allergen Reactions   Ritalin [Methylphenidate Hcl] Other (See Comments)    seizures   Abilify [Aripiprazole] Other (See Comments)    Shaking or tremors   Lithium      Zelda Sharps, NP This note was created using Scientist, clinical (histocompatibility and immunogenetics). Please excuse any inadvertent transcription errors. Case was discussed with supervising physician Dr. Jadapalle who is agreeable with current plan.

## 2024-01-04 NOTE — TOC CM/SW Note (Signed)
" °  °  Durable Medical Equipment  (From admission, onward)           Start     Ordered   01/04/24 1000  For home use only DME standard manual wheelchair with seat cushion  Once       Comments: Patient suffers from debility, rhinovirus infection which impairs their ability to perform daily activities like bathing, dressing, and toileting in the home.  A walker will not resolve issue with performing activities of daily living. A wheelchair will allow patient to safely perform daily activities. Patient can safely propel the wheelchair in the home or has a caregiver who can provide assistance. Length of need 6 months . Accessories: elevating leg rests (ELRs), wheel locks, extensions and anti-tippers.   01/04/24 0959   01/04/24 0959  For home use only DME Walker rolling  Once       Question Answer Comment  Walker: With 5 Inch Wheels   Patient needs a walker to treat with the following condition Rhinovirus infection      01/04/24 0958   01/03/24 0000  For home use only DME oxygen       Question Answer Comment  Length of Need Lifetime   Mode or (Route) Nasal cannula   Liters per Minute 2   Frequency Only at night (stationary unit needed)   Oxygen delivery system: Gas      01/03/24 1701   12/22/23 1250  For home use only DME Bipap  Once       Question Answer Comment  Length of Need Lifetime   Inspiratory pressure 18   Expiratory pressure 8      12/22/23 1249   12/20/23 0949  For home use only DME continuous positive airway pressure (CPAP)  Once       Question Answer Comment  Length of Need Lifetime   Patient has OSA or probable OSA Yes   Is the patient currently using CPAP in the home No   Settings Other see comments   CPAP supplies needed Mask, headgear, cushions, filters, heated tubing and water chamber      12/20/23 0948            "

## 2024-01-04 NOTE — Plan of Care (Signed)
  Problem: Health Behavior/Discharge Planning: Goal: Ability to manage health-related needs will improve Outcome: Progressing   Problem: Clinical Measurements: Goal: Will remain free from infection Outcome: Progressing Goal: Diagnostic test results will improve Outcome: Progressing Goal: Respiratory complications will improve Outcome: Progressing   

## 2024-01-04 NOTE — Progress Notes (Signed)
 " Progress Note   Patient: Sherry Bryan FMW:[id number] DOB: [birth year] DOA: 12/16/2023     18 DOS: the patient was seen and examined on 01/04/2024   Brief hospital course: Sherry Bryan is a 28 y.o. year old female with past medical history of hypertension, hypothyroidism, seizure disorder, GERD, prior blood clots on Eliquis , major depressive disorder, DMDD, intellectual disability, borderline personality disorder, PTSD, and adjustment disorder.  She presented to [facility] regional ED  with reports of suicidal ideation and productive cough.  While pending psychiatric evaluation, she was found to have productive cough with green sputum, dyspnea, and chest pain associated with coughing for about 1 week.    ED Course: The patient was noted to be febrile temp 38.6 C, BP 98/71 and as low as 89/53 with MAP of 63, RR 21, SpO2 89% on room air and improving to 93% on 4 L via nasal cannula. CT head obtained and shows no acute intracranial abnormality with mucosal thickening and air-fluid levels in bilateral maxillary ethmoid sinuses and left sphenoid sinus.  CTA PE obtained and negative for PE, did show ground glass and streaky opacities in the lower lobes bilaterally. Labs notable for leukocytosis WBC 10.9 yesterday on ED arrival however improved to 7.7 this morning, lactic acid mildly elevated at 2.5. She was given Tylenol , Rocephin , doxycycline , and home meds including antihypertensives.   She was found to have a rhinovirus infection.   Principal Problem:   Rhinovirus infection Active Problems:   Hx of seizure disorder   Hx of gastroesophageal reflux (GERD)   Intellectual disability   Suicidal ideations   Depression   Acute on chronic respiratory failure with hypoxia and hypercapnia (HCC)   Hypothyroidism   Essential hypertension   Morbid obesity (HCC)   Schizoaffective disorder (HCC)   PTSD (post-traumatic stress disorder)   Obesity hypoventilation syndrome (HCC)   Assessment and  Plan: Acute on chronic respiratory failure with hypoxemia and hypercapnia. Rhino viral infection with superimposed bacterial pneumonia. Patient has completed antibiotics, off oxygen.  She has been placed on BiPAP nightly.  Tolerating well.  Currently medically stable for discharge.  Class III obesity. Obesity hypoventilation syndrome. Obstructive sleep apnea. Patient appears to have chronic CO2 retention, has been tolerating BiPAP at night.  Outpatient NIV has been approved.  Patient can discharge when accepted.   Schizoaffective disorder. PTSD. Depression with recent suicidal thoughts. Intellectual disability Patient was evaluated by psychiatry, no longer has suicidal thoughts.  Cleared for discharge.  Seizure disorder. Continue home medicines.  Hypothyroidism. Continue home medicines.  Essential hypertension. Intermittent hypotension. All blood pressure medicine on hold, patient is on midodrine .     Subjective:  Patient doing well today, she does not have any short of breath, off oxygen.  No nausea vomiting.  Had a normal bowel movement  Physical Exam: Vitals:   01/03/24 1245 01/03/24 1555 01/03/24 1936 01/04/24 0738  BP: 113/72 104/70 (!) 109/55 (!) 87/59  Pulse: 88 (!) 106 99 89  Resp:  18 20 19   Temp:  98.2 F (36.8 C) 98.9 F (37.2 C) 98.7 F (37.1 C)  TempSrc:  Oral Oral Oral  SpO2:  97% 92% 91%  Weight:      Height:       General exam: Appears calm and comfortable, morbid obese Respiratory system: Clear to auscultation. Respiratory effort normal. Cardiovascular system: S1 & S2 heard, RRR. No JVD, murmurs, rubs, gallops or clicks. No pedal edema. Gastrointestinal system: Abdomen is nondistended, soft and nontender. No organomegaly or masses felt.  Normal bowel sounds heard. Central nervous system: Alert and oriented. No focal neurological deficits. Extremities: Symmetric 5 x 5 power. Skin: No rashes, lesions or ulcers Psychiatry: Judgement and insight  appear normal. Mood & affect appropriate.    Data Reviewed:  Reviewed CT scan results and lab results  Family Communication: None  Disposition: Status is: Inpatient Remains inpatient appropriate because: Unsafe discharge, group home will accept him tomorrow     Time spent: 50 minutes  Author: Murvin Mana, MD 01/04/2024 10:31 AM  For on call review [email or web address].    "

## 2024-01-04 NOTE — Progress Notes (Signed)
 The patient is a Level 4 mobility and her gait is unsteady. The patient rang her call light for assistance and when this writer entered the patient's room, this writer noted that the patient had gotten up w/o assist sitting on the [facility] in her bare feet.  The patient's non-skid socks were at the foot of the bed and the bed alarm was turned off. This clinical research associate educated the patient on the importance of calling for assist before getting out of bed and wearing her non-skid socks when walking around her room for her safety. The charge nurse was made aware and an order was obtained for a [employer].

## 2024-01-05 DIAGNOSIS — F259 Schizoaffective disorder, unspecified: Secondary | ICD-10-CM | POA: Diagnosis not present

## 2024-01-05 DIAGNOSIS — J9621 Acute and chronic respiratory failure with hypoxia: Secondary | ICD-10-CM | POA: Diagnosis not present

## 2024-01-05 DIAGNOSIS — B348 Other viral infections of unspecified site: Secondary | ICD-10-CM | POA: Diagnosis not present

## 2024-01-05 DIAGNOSIS — J9622 Acute and chronic respiratory failure with hypercapnia: Secondary | ICD-10-CM | POA: Diagnosis not present

## 2024-01-05 NOTE — Discharge Summary (Signed)
 " Physician Discharge Summary   Patient: Sherry Bryan MRN: [id number] DOB: 1995/08/30  Admit date:     12/16/2023  Discharge date: 01/05/2024  Discharge Physician: Murvin Mana   PCP: Pcp, No   Recommendations at discharge:   Follow-up with PCP in 1 week, TOC to arrange.  Discharge Diagnoses: Principal Problem:   Rhinovirus infection Active Problems:   Hx of seizure disorder   Hx of gastroesophageal reflux (GERD)   Intellectual disability   Suicidal ideations   Depression   Acute on chronic respiratory failure with hypoxia and hypercapnia (HCC)   Hypothyroidism   Essential hypertension   Morbid obesity (HCC)   Schizoaffective disorder (HCC)   PTSD (post-traumatic stress disorder)   Obesity hypoventilation syndrome (HCC)  Resolved Problems:   Disruptive mood dysregulation disorder  Hospital Course: Sherry Bryan is a 28 y.o. year old female with past medical history of hypertension, hypothyroidism, seizure disorder, GERD, prior blood clots on Eliquis , major depressive disorder, DMDD, intellectual disability, borderline personality disorder, PTSD, and adjustment disorder.  She presented to [facility] regional ED  with reports of suicidal ideation and productive cough.  While pending psychiatric evaluation, she was found to have productive cough with green sputum, dyspnea, and chest pain associated with coughing for about 1 week.    ED Course: The patient was noted to be febrile temp 38.6 C, BP 98/71 and as low as 89/53 with MAP of 63, RR 21, SpO2 89% on room air and improving to 93% on 4 L via nasal cannula. CT head obtained and shows no acute intracranial abnormality with mucosal thickening and air-fluid levels in bilateral maxillary ethmoid sinuses and left sphenoid sinus.  CTA PE obtained and negative for PE, did show ground glass and streaky opacities in the lower lobes bilaterally. Labs notable for leukocytosis WBC 10.9 yesterday on ED arrival however improved to 7.7 this  morning, lactic acid mildly elevated at 2.5. She was given Tylenol , Rocephin , doxycycline , and home meds including antihypertensives.   She was found to have a rhinovirus infection.  Assessment and Plan: Acute on chronic respiratory failure with hypoxemia and hypercapnia. Rhino viral infection with superimposed bacterial pneumonia. Patient has completed antibiotics, off oxygen.  She has been placed on BiPAP nightly.  Tolerating well.  Currently medically stable for discharge.   Class III obesity.  BMI 51.37 Obesity hypoventilation syndrome. Obstructive sleep apnea. Patient appears to have chronic CO2 retention, has been tolerating BiPAP at night.  Outpatient NIV has been approved.  Patient has been accepted back to group home, we will discharge today     Schizoaffective disorder. PTSD. Depression with recent suicidal thoughts. Intellectual disability Patient was evaluated by psychiatry, no longer has suicidal thoughts.  Cleared for discharge.   Seizure disorder. Continue home medicines.   Hypothyroidism. Continue home medicines.   Essential hypertension. Intermittent hypotension. Discontinued all blood pressure medicines, patient is on midodrine .         Consultants: Psychiatry Procedures performed: None  Disposition: Home Diet recommendation:  Discharge Diet Orders (From admission, onward)     Start     Ordered   01/05/24 0000  Diet - low sodium heart healthy        01/05/24 0904           Cardiac diet DISCHARGE MEDICATION: Allergies as of 01/05/2024       Reactions   Ritalin [methylphenidate Hcl] Other (See Comments)   seizures   Abilify [aripiprazole] Other (See Comments)   Shaking or tremors   Lithium   Medication List     STOP taking these medications    cloNIDine  0.1 MG tablet Commonly known as: CATAPRES    hydrochlorothiazide  25 MG tablet Commonly known as: HYDRODIURIL    metoprolol  succinate 50 MG 24 hr tablet Commonly known as:  TOPROL -XL   prazosin  2 MG capsule Commonly known as: MINIPRESS    QUEtiapine  50 MG Tb24 24 hr tablet Commonly known as: SEROQUEL  XR Replaced by: QUEtiapine  25 MG tablet       TAKE these medications    benztropine  1 MG tablet Commonly known as: COGENTIN  Take 1 mg by mouth 2 (two) times daily. The timing of this medication is very important.   apixaban  5 MG Tabs tablet Commonly known as: ELIQUIS  Take 5 mg by mouth 2 (two) times daily.   divalproex  500 MG DR tablet Commonly known as: DEPAKOTE  Take 1,000 mg by mouth 2 (two) times daily.   escitalopram  10 MG tablet Commonly known as: LEXAPRO  Take 10 mg by mouth daily.   ferrous sulfate  325 (65 FE) MG tablet Take 1 tablet (325 mg total) by mouth every other day.   folic acid  1 MG tablet Commonly known as: FOLVITE  Take 1 mg by mouth daily.   levETIRAcetam  500 MG tablet Commonly known as: KEPPRA  Take 500 mg by mouth 2 (two) times daily.   levothyroxine  50 MCG tablet Commonly known as: SYNTHROID  Take 50 mcg by mouth daily before breakfast.   loperamide  2 MG tablet Commonly known as: Imodium  A-D Take 1 tablet (2 mg total) by mouth 4 (four) times daily as needed for diarrhea or loose stools.   LORazepam  1 MG tablet Commonly known as: ATIVAN  Take 0.5 tablets (0.5 mg total) by mouth at bedtime. What changed:  how much to take when to take this   meclizine  25 MG tablet Commonly known as: ANTIVERT  Take 25 mg by mouth 3 (three) times daily as needed for nausea.   midodrine  5 MG tablet Commonly known as: PROAMATINE  Take 1 tablet (5 mg total) by mouth 3 (three) times daily with meals.   Multivitamin Adults Tabs Take 1 tablet by mouth daily.   omeprazole 40 MG capsule Commonly known as: PRILOSEC Take 40 mg by mouth daily.   QUEtiapine  25 MG tablet Commonly known as: SEROQUEL  Take 0.5 tablets (12.5 mg total) by mouth at bedtime. Replaces: QUEtiapine  50 MG Tb24 24 hr tablet   traZODone  50 MG tablet Commonly  known as: DESYREL  Take 1.5 tablets (75 mg total) by mouth at bedtime. What changed:  medication strength how much to take   Uzedy  100 MG/0.28ML syringe Generic drug: risperiDONE  ER Inject 100 mg into the skin every 30 (thirty) days.   Ventolin  HFA 108 (90 Base) MCG/ACT inhaler Generic drug: albuterol  Inhale 2 puffs into the lungs every 6 (six) hours as needed for wheezing or shortness of breath.   Xanomeline-Trospium  Chloride 125-30 MG Caps Take 1 capsule by mouth 2 (two) times daily.               Durable Medical Equipment  (From admission, onward)           Start     Ordered   01/04/24 1000  For home use only DME standard manual wheelchair with seat cushion  Once       Comments: Patient suffers from debility, rhinovirus infection which impairs their ability to perform daily activities like bathing, dressing, and toileting in the home.  A walker will not resolve issue with performing activities of daily living. A wheelchair  will allow patient to safely perform daily activities. Patient can safely propel the wheelchair in the home or has a caregiver who can provide assistance. Length of need 6 months . Accessories: elevating leg rests (ELRs), wheel locks, extensions and anti-tippers.   01/04/24 0959   01/04/24 0959  For home use only DME Walker rolling  Once       Question Answer Comment  Walker: With 5 Inch Wheels   Patient needs a walker to treat with the following condition Rhinovirus infection      01/04/24 0958   01/03/24 0000  For home use only DME oxygen       Question Answer Comment  Length of Need Lifetime   Mode or (Route) Nasal cannula   Liters per Minute 2   Frequency Only at night (stationary unit needed)   Oxygen delivery system: Gas      01/03/24 1701   12/22/23 1250  For home use only DME Bipap  Once       Question Answer Comment  Length of Need Lifetime   Inspiratory pressure 18   Expiratory pressure 8      12/22/23 1249   12/20/23 0949  For  home use only DME continuous positive airway pressure (CPAP)  Once       Question Answer Comment  Length of Need Lifetime   Patient has OSA or probable OSA Yes   Is the patient currently using CPAP in the home No   Settings Other see comments   CPAP supplies needed Mask, headgear, cushions, filters, heated tubing and water chamber      12/20/23 0948            Discharge Exam: Filed Weights   12/16/23 1322 12/24/23 0500 01/04/24 1200  Weight: (!) 137.9 kg (!) 141.4 kg 135.8 kg   General exam: Appears calm and comfortable, morbid obesity Respiratory system: Clear to auscultation. Respiratory effort normal. Cardiovascular system: S1 & S2 heard, RRR. No JVD, murmurs, rubs, gallops or clicks. No pedal edema. Gastrointestinal system: Abdomen is nondistended, soft and nontender. No organomegaly or masses felt. Normal bowel sounds heard. Central nervous system: Alert and oriented. No focal neurological deficits. Extremities: Symmetric 5 x 5 power. Skin: No rashes, lesions or ulcers Psychiatry: Judgement and insight appear normal. Mood & affect appropriate.    Condition at discharge: good  The results of significant diagnostics from this hospitalization (including imaging, microbiology, ancillary and laboratory) are listed below for reference.   Imaging Studies: DG Chest Port 1 View Result Date: 12/29/2023 EXAM: 1 VIEW(S) XRAY OF THE CHEST 12/29/2023 10:33:15 AM COMPARISON: Comparison with 12/27/2023. CLINICAL HISTORY: [id number] Infiltrate of both lungs present on imaging study [id number] Infiltrate of both lungs present on imaging study. FINDINGS: LUNGS AND PLEURA: Child inspiration. Perihilar and basilar infiltrates may represent edema pneumonia. Infiltrates are improved since the prior study. No pleural effusion or pneumothorax. HEART AND MEDIASTINUM: Cardiac enlargement. BONES AND SOFT TISSUES: No acute osseous abnormality. IMPRESSION: 1. Perihilar and basilar infiltrates, improved since  the prior study, possibly representing edema or pneumonia. 2. No pleural effusion or pneumothorax. 3. Cardiac enlargement. Electronically signed by: Elsie Gravely MD 12/29/2023 09:10 PM EST RP Workstation: [id number]   DG Chest Port 1 View Result Date: 12/27/2023 CLINICAL DATA:  Hypoxic respiratory failure. EXAM: PORTABLE CHEST 1 VIEW COMPARISON:  One view chest 12/16/2023 FINDINGS: Low inspiratory volumes. Obscuring of the left heart border and increased pulmonary opacities consistent with acute bilateral infiltrative process. No right-sided pleural effusion. The left  costophrenic angle is obscured by soft tissue. No acute osseous process. No pneumothorax. IMPRESSION: Acute bilateral pulmonary infiltrates Electronically Signed   By: Cordella Banner   On: 12/27/2023 15:47   CT Angio Chest PE W/Cm &/Or Wo Cm Result Date: 12/17/2023 EXAM: CTA of the Chest without and with contrast for PE 12/17/2023 10:52:44 AM TECHNIQUE: CTA of the chest was performed without and with the administration of 75 mL of iohexol  (OMNIPAQUE ) 350 MG/ML intravenous contrast. Multiplanar reformatted images are provided for review. MIP images are provided for review. Automated exposure control, iterative reconstruction, and/or weight based adjustment of the mA/kV was utilized to reduce the radiation dose to as low as reasonably achievable. COMPARISON: None available. CLINICAL HISTORY: Pulmonary embolism (PE) suspected, high prob. FINDINGS: PULMONARY ARTERIES: Pulmonary arteries are adequately opacified for evaluation. No pulmonary embolism. Main pulmonary artery is normal in caliber. MEDIASTINUM: The heart and pericardium demonstrate no acute abnormality. There is no acute abnormality of the thoracic aorta. LYMPH NODES: No mediastinal, hilar or axillary lymphadenopathy. LUNGS AND PLEURA: There are ground glass and streaky opacities present within the lower lobes bilaterally. The nondependent lungs are relatively clear. No pleural  effusion or pneumothorax. UPPER ABDOMEN: Limited images of the upper abdomen are unremarkable. SOFT TISSUES AND BONES: No acute bone or soft tissue abnormality. IMPRESSION: 1. No pulmonary embolism. 2. Ground-glass and streaky opacities in the lower lobes bilaterally, which may reflect atelectasis, aspiration, or atypical/viral pneumonia; correlate clinically. Electronically signed by: Evalene Coho MD 12/17/2023 11:00 AM EST RP Workstation: [id number]   CT Head Wo Contrast Result Date: 12/17/2023 EXAM: CT HEAD WITHOUT CONTRAST 12/17/2023 10:52:44 AM TECHNIQUE: CT of the head was performed without the administration of intravenous contrast. Automated exposure control, iterative reconstruction, and/or weight based adjustment of the mA/kV was utilized to reduce the radiation dose to as low as reasonably achievable. COMPARISON: 06/16/2023 CLINICAL HISTORY: headache FINDINGS: BRAIN AND VENTRICLES: No acute hemorrhage. No evidence of acute infarct. No hydrocephalus. No extra-axial collection. No mass effect or midline shift. ORBITS: No acute abnormality. SINUSES: Mucosal thickening and air-fluid levels in bilateral maxillary and ethmoid sinuses and left sphenoid sinus. SOFT TISSUES AND SKULL: No acute soft tissue abnormality. No skull fracture. IMPRESSION: 1. No acute intracranial abnormality. 2. Mucosal thickening and air-fluid levels in bilateral maxillary and ethmoid sinuses and left sphenoid sinus. Electronically signed by: Evalene Coho MD 12/17/2023 10:56 AM EST RP Workstation: [id number]   DG Chest 1 View Result Date: 12/16/2023 EXAM: 1 VIEW(S) XRAY OF THE CHEST 12/16/2023 01:51:45 PM COMPARISON: 04/29/2023 CLINICAL HISTORY: SOB (shortness of breath) FINDINGS: LUNGS AND PLEURA: L.ow lung volumes. No focal pulmonary opacity. No pleural effusion. No pneumothorax. HEART AND MEDIASTINUM: No acute abnormality of the cardiac and mediastinal silhouettes. BONES AND SOFT TISSUES: No acute osseous abnormality.  IMPRESSION: 1. No acute cardiopulmonary process identified. Electronically signed by: Waddell Calk MD 12/16/2023 01:58 PM EST RP Workstation: [id number]   DG Shoulder Right Result Date: 12/15/2023 CLINICAL DATA:  Shoulder pain EXAM: RIGHT SHOULDER - 2+ VIEW COMPARISON:  None Available. FINDINGS: No acute fracture or dislocation. There is no evidence of arthropathy or other focal bone abnormality. Soft tissues are unremarkable. IMPRESSION: No acute fracture or dislocation. Electronically Signed   By: Rogelia Myers M.D.   On: 12/15/2023 09:30    Microbiology: Results for orders placed or performed during the hospital encounter of 12/16/23  Resp panel by RT-PCR (RSV, Flu A&B, Covid) Anterior Nasal Swab     Status: None   Collection Time: 12/16/23  1:28 PM   Specimen: Anterior Nasal Swab  Result Value Ref Range Status   SARS Coronavirus 2 by RT PCR NEGATIVE NEGATIVE Final    Comment: (NOTE) SARS-CoV-2 target nucleic acids are NOT DETECTED.  The SARS-CoV-2 RNA is generally detectable in upper respiratory specimens during the acute phase of infection. The lowest concentration of SARS-CoV-2 viral copies this assay can detect is 138 copies/mL. A negative result does not preclude SARS-Cov-2 infection and should not be used as the sole basis for treatment or other patient management decisions. A negative result may occur with  improper specimen collection/handling, submission of specimen other than nasopharyngeal swab, presence of viral mutation(s) within the areas targeted by this assay, and inadequate number of viral copies(<138 copies/mL). A negative result must be combined with clinical observations, patient history, and epidemiological information. The expected result is Negative.  Fact Sheet for Patients:  bloggercourse.com  Fact Sheet for Healthcare Providers:  seriousbroker.it  This test is no t yet approved or cleared by the [place]  [place] FDA and  has been authorized for detection and/or diagnosis of SARS-CoV-2 by FDA under an Emergency Use Authorization (EUA). This EUA will remain  in effect (meaning this test can be used) for the duration of the COVID-19 declaration under Section 564(b)(1) of the Act, 21 U.S.C.section 360bbb-3(b)(1), unless the authorization is terminated  or revoked sooner.       Influenza A by PCR NEGATIVE NEGATIVE Final   Influenza B by PCR NEGATIVE NEGATIVE Final    Comment: (NOTE) The Xpert Xpress SARS-CoV-2/FLU/RSV plus assay is intended as an aid in the diagnosis of influenza from Nasopharyngeal swab specimens and should not be used as a sole basis for treatment. Nasal washings and aspirates are unacceptable for Xpert Xpress SARS-CoV-2/FLU/RSV testing.  Fact Sheet for Patients: bloggercourse.com  Fact Sheet for Healthcare Providers: seriousbroker.it  This test is not yet approved or cleared by the [place]  FDA and has been authorized for detection and/or diagnosis of SARS-CoV-2 by FDA under an Emergency Use Authorization (EUA). This EUA will remain in effect (meaning this test can be used) for the duration of the COVID-19 declaration under Section 564(b)(1) of the Act, 21 U.S.C. section 360bbb-3(b)(1), unless the authorization is terminated or revoked.     Resp Syncytial Virus by PCR NEGATIVE NEGATIVE Final    Comment: (NOTE) Fact Sheet for Patients: bloggercourse.com  Fact Sheet for Healthcare Providers: seriousbroker.it  This test is not yet approved or cleared by the [place]  FDA and has been authorized for detection and/or diagnosis of SARS-CoV-2 by FDA under an Emergency Use Authorization (EUA). This EUA will remain in effect (meaning this test can be used) for the duration of the COVID-19 declaration under Section 564(b)(1) of the Act, 21 U.S.C. section  360bbb-3(b)(1), unless the authorization is terminated or revoked.  Performed at [facility], [address] [place], [place] [address]   Culture, blood (routine x 2)     Status: None   Collection Time: 12/17/23 10:16 AM   Specimen: BLOOD  Result Value Ref Range Status   Specimen Description BLOOD BLOOD RIGHT HAND  Final   Special Requests   Final    BOTTLES DRAWN AEROBIC AND ANAEROBIC Blood Culture adequate volume   Culture   Final    NO GROWTH 5 DAYS Performed at [facility], [address] [place], [place] [address]    Report Status 12/22/2023 FINAL  Final  Culture, blood (routine x 2)     Status: None  Collection Time: 12/17/23 10:16 AM   Specimen: BLOOD  Result Value Ref Range Status   Specimen Description BLOOD BLOOD LEFT ARM  Final   Special Requests   Final    BOTTLES DRAWN AEROBIC AND ANAEROBIC Blood Culture adequate volume   Culture   Final    NO GROWTH 5 DAYS Performed at [facility], [address] [place], [place] [address]    Report Status 12/22/2023 FINAL  Final  Respiratory (~20 pathogens) panel by PCR     Status: Abnormal   Collection Time: 12/17/23 12:41 PM   Specimen: Nasopharyngeal Swab; Respiratory  Result Value Ref Range Status   Adenovirus NOT DETECTED NOT DETECTED Final   Coronavirus 229E NOT DETECTED NOT DETECTED Final    Comment: (NOTE) The Coronavirus on the Respiratory Panel, DOES NOT test for the novel  Coronavirus (2019 nCoV)    Coronavirus HKU1 NOT DETECTED NOT DETECTED Final   Coronavirus NL63 NOT DETECTED NOT DETECTED Final   Coronavirus OC43 NOT DETECTED NOT DETECTED Final   Metapneumovirus NOT DETECTED NOT DETECTED Final   Rhinovirus / Enterovirus DETECTED (A) NOT DETECTED Final   Influenza A NOT DETECTED NOT DETECTED Final   Influenza B NOT DETECTED NOT DETECTED Final   Parainfluenza Virus 1 NOT DETECTED NOT DETECTED Final   Parainfluenza Virus 2 NOT DETECTED NOT DETECTED Final   Parainfluenza  Virus 3 NOT DETECTED NOT DETECTED Final   Parainfluenza Virus 4 NOT DETECTED NOT DETECTED Final   Respiratory Syncytial Virus NOT DETECTED NOT DETECTED Final   Bordetella pertussis NOT DETECTED NOT DETECTED Final   Bordetella Parapertussis NOT DETECTED NOT DETECTED Final   Chlamydophila pneumoniae NOT DETECTED NOT DETECTED Final   Mycoplasma pneumoniae NOT DETECTED NOT DETECTED Final    Comment: Performed at [facility], [address], [place], [place] [address]    Labs: CBC: Recent Labs  Lab 12/30/23 0409  WBC 8.2  HGB 11.4*  HCT 38.1  MCV 84.1  PLT 173   Basic Metabolic Panel: Recent Labs  Lab 12/30/23 0409  NA 137  K 4.5  CL 103  CO2 28  GLUCOSE 94  BUN 15  CREATININE 0.91  CALCIUM 8.7*  MG 2.1  PHOS 3.9   Liver Function Tests: Recent Labs  Lab 12/30/23 0409  AST 14*  ALT 16  ALKPHOS 45  BILITOT 0.2  PROT 6.1*  ALBUMIN 3.0*   CBG: No results for input(s): GLUCAP in the last 168 hours.  Discharge time spent: 35 minutes.  Signed: Murvin Mana, MD [facility] 01/05/2024 "

## 2024-01-05 NOTE — Plan of Care (Signed)

## 2024-01-05 NOTE — TOC Progression Note (Signed)
 Transition of Care [facility]) - Progression Note    Patient Details  Name: Sherry Bryan MRN: [id number] Date of Birth: [birth year]  [facility] of [facility] CM/SW Contact  Sherry JONETTA Hamilton, RN Phone Number: 01/05/2024, 9:49 AM  Clinical Narrative:     This CM lvmm for patient's legal guardian Sherry Bryan to return my call.                     Expected Discharge Plan and Services         Expected Discharge Date: 01/05/24                                     Social Drivers of Health (SDOH) Interventions SDOH Screenings   Food Insecurity: No Food Insecurity (12/19/2023)  Housing: Patient Unable To Answer (12/20/2023)  Transportation Needs: Patient Unable To Answer (12/20/2023)  Utilities: Not At Risk (12/19/2023)  Tobacco Use: Medium Risk (12/16/2023)    Readmission Risk Interventions     No data to display

## 2024-01-05 NOTE — TOC Transition Note (Signed)
 Transition of Care [facility]) - Discharge Note   Patient Details  Name: Sherry Bryan MRN: [id number] Date of Birth: June 13, 1995  Transition of Care [facility]) CM/SW Contact:  Daved JONETTA Hamilton, RN Phone Number: 01/05/2024, 5:06 PM   Clinical Narrative:     Patient will DC to: Group Home Anticipated DC date: 01/05/24 Family notified: Lorrin Rollings- lvmm Transport by: Group Home  Per MD patient ready for DC to Group Home. DME has been delivered/ arranged by [employer].   TOC signing off.   Final next level of care: Group Home Barriers to Discharge: Barriers Resolved   Patient Goals and CMS Choice            Discharge Placement                  Name of family member notified: Lorrin Rollings- lvmm Patient and family notified of of transfer: 01/05/24  Discharge Plan and Services Additional resources added to the After Visit Summary for                  DME Arranged: NIV, Oxygen, Wheelchair manual DME Agency: [employer] Date DME Agency Contacted: 01/02/24 Time DME Agency Contacted: 1444 Representative spoke with at DME Agency: [place]            Social Drivers of Health (SDOH) Interventions SDOH Screenings   Food Insecurity: No Food Insecurity (12/19/2023)  Housing: Patient Unable To Answer (12/20/2023)  Transportation Needs: Patient Unable To Answer (12/20/2023)  Utilities: Not At Risk (12/19/2023)  Tobacco Use: Medium Risk (12/16/2023)     Readmission Risk Interventions     No data to display

## 2024-01-05 NOTE — Progress Notes (Signed)
 "    PULMONOLOGY         Date: 01/05/2024,   MRN# [id number] Sherry Bryan 12-26-1995     AdmissionWeight: (!) 137.9 kg                 CurrentWeight: 135.8 kg  Referring provider: Dr. Franchot   CHIEF COMPLAINT:   Hypercapnic hypoxemic respiratory failure   HISTORY OF PRESENT ILLNESS   This is a 28 year old female with a history of borderline personality disorder, chronic constipation, major depressive disorder, disruptive mood dysregulation disorder, GERD, history of seizure disorder, intellectual disability, pulmonary embolism, morbid obesity posttraumatic stress disorder, suicidal ideation, who was brought in with respiratory distress and a productive cough significant for expectoration of dark phlegm.  On arrival to the emergency department she was hypoxemic requiring 4 L via nasal cannula to reach 89% and was hypotensive with blood pressure less than 100 systolic.  She did have a CT chest obtained on arrival to the ER with ground glass opacification bilaterally but negative for PE.  She is found to be rhinovirus positive.  CT chest with bilateral infiltrates, ABG with hypercapnia and hypoxemia.  She is being evaluated by psychiatry due to self-harm and history of suicidal ideation with multiple psychiatric diagnoses.  PCCM consultation for progressive hypercapnic respiratory failure with hypoxemia potentially needing home BiPAP.  12/25/23- patient is on HFNC resting in bed in no acute distress. She may be seen in pulmonary clinic post dc home.  PCCM will sign off today.  BIPAP has been ordered for home use. She currently is being treated for rhinovirus associated respiratory failure with hypercapnia and hypoxemia.   12/27/23- patient seen at bedside with now weaning of O2 to 8L/min [place] HF.  She is in no distress. I'm repeating ABG today.   12/28/23- patient resting in bed on 7L/min [place].  NO apparent distress.  Have placed orders for BIPAP.  Working with TOC on obtaining  medical supplies for home  12/29/23- patient is with transient hypotension this morning.  I have stopped her metoprolol  today due to low BP.  She is now on only 2L/min and seems to be improved medically. Ive repeated cxr this morning to evaluate interval changes on consolidative opacities from rhinovirus infection   01/05/24 - I saw patient at bedside.  She has received BIPAP, I discussed her care with Francis from [employer] and he will provide educational session for patient to use BIPAP.  I can follow up with her on outpatient basis  PAST MEDICAL HISTORY   Past Medical History:  Diagnosis Date   Borderline personality disorder (HCC)    Constipation 05/15/2015   Depression    DMDD (disruptive mood dysregulation disorder) 05/15/2015   History of seasonal allergies 05/15/2015   Hx of gastroesophageal reflux (GERD) 05/15/2015   Hx of seizure disorder 05/15/2015   Intellectual disability 05/22/2015   PTSD (post-traumatic stress disorder)    Seizures (HCC)    last one in 2015   Suicidal ideation      SURGICAL HISTORY   History reviewed. No pertinent surgical history.   FAMILY HISTORY   History reviewed. No pertinent family history.   SOCIAL HISTORY   Social History[1]   MEDICATIONS   Current Outpatient Rx   Order #: [id number] Class: Normal   Order #: [id number] Class: No Print   Order #: [id number] Class: Normal   Order #: [id number] Class: Normal   Order #: [id number] Class: Normal    Current Medication: Current Medications[2]    ALLERGIES  Ritalin [methylphenidate hcl], Abilify [aripiprazole], and Lithium      REVIEW OF SYSTEMS    Review of Systems:  Gen:  Denies  fever, sweats, chills weigh loss  HEENT: Denies blurred vision, double vision, ear pain, eye pain, hearing loss, nose bleeds, sore throat Cardiac:  No dizziness, chest pain or heaviness, chest tightness,edema Resp:   reports dyspnea chronically  Gi: Denies swallowing difficulty, stomach pain,  nausea or vomiting, diarrhea, constipation, bowel incontinence Gu:  Denies bladder incontinence, burning urine Ext:   Denies Joint pain, stiffness or swelling Skin: Denies  skin rash, easy bruising or bleeding or hives Endoc:  Denies polyuria, polydipsia , polyphagia or weight change Psych:   Denies depression, insomnia or hallucinations   Other:  All other systems negative   VS: BP 114/75 (BP Location: Left Arm)   Pulse 87   Temp 97.8 F (36.6 C) (Oral)   Resp 19   Ht 5' 4.02 (1.626 m)   Wt 135.8 kg   SpO2 91%   BMI 51.37 kg/m      PHYSICAL EXAM    GENERAL:NAD, no fevers, chills, no weakness no fatigue HEAD: Normocephalic, atraumatic.  EYES: Pupils equal, round, reactive to light. Extraocular muscles intact. No scleral icterus.  MOUTH: Moist mucosal membrane. Dentition intact. No abscess noted.  EAR, NOSE, THROAT: Clear without exudates. No external lesions.  NECK: Supple. No thyromegaly. No nodules. No JVD.  PULMONARY: decreased breath sounds with mild rhonchi worse at bases bilaterally.  CARDIOVASCULAR: S1 and S2. Regular rate and rhythm. No murmurs, rubs, or gallops. No edema. Pedal pulses 2+ bilaterally.  GASTROINTESTINAL: Soft, nontender, nondistended. No masses. Positive bowel sounds. No hepatosplenomegaly.  MUSCULOSKELETAL: No swelling, clubbing, or edema. Range of motion full in all extremities.  NEUROLOGIC: Cranial nerves II through XII are intact. No gross focal neurological deficits. Sensation intact. Reflexes intact.  SKIN: No ulceration, lesions, rashes, or cyanosis. Skin warm and dry. Turgor intact.  PSYCHIATRIC: Mood, affect within normal limits. The patient is awake, alert and oriented x 3. Insight, judgment intact.       IMAGING    CT Head Wo Contrast Final result 12/17/2023 10:52 AM        EXAM: CT HEAD WITHOUT CONTRAST 12/17/2023 10:52:44 AM  TECHNIQUE: CT of the head was performed without the administration of intravenous contrast. Automated  exposure control, iterative reconstruction, and/or weight based adjustment of the mA/kV was utilized to reduce the radiation dose to as low as reasonably achievable.  ...        Study Result  Narrative & Impression  EXAM: CTA of the Chest without and with contrast for PE 12/17/2023 10:52:44 AM   TECHNIQUE: CTA of the chest was performed without and with the administration of 75 mL of iohexol  (OMNIPAQUE ) 350 MG/ML intravenous contrast. Multiplanar reformatted images are provided for review. MIP images are provided for review. Automated exposure control, iterative reconstruction, and/or weight based adjustment of the mA/kV was utilized to reduce the radiation dose to as low as reasonably achievable.   COMPARISON: None available.   CLINICAL HISTORY: Pulmonary embolism (PE) suspected, high prob.   FINDINGS:   PULMONARY ARTERIES: Pulmonary arteries are adequately opacified for evaluation. No pulmonary embolism. Main pulmonary artery is normal in caliber.   MEDIASTINUM: The heart and pericardium demonstrate no acute abnormality. There is no acute abnormality of the thoracic aorta.   LYMPH NODES: No mediastinal, hilar or axillary lymphadenopathy.   LUNGS AND PLEURA: There are ground glass and streaky opacities present within the  lower lobes bilaterally. The nondependent lungs are relatively clear. No pleural effusion or pneumothorax.   UPPER ABDOMEN: Limited images of the upper abdomen are unremarkable.   SOFT TISSUES AND BONES: No acute bone or soft tissue abnormality.   IMPRESSION: 1. No pulmonary embolism. 2. Ground-glass and streaky opacities in the lower lobes bilaterally, which may reflect atelectasis, aspiration, or atypical/viral pneumonia; correlate clinically.   Electronically signed by: Evalene Coho MD 12/17/2023 11:00 AM EST RP Workstation: [id number]      ASSESSMENT/PLAN   Hypercapnic hypoxemic respiratory failure    Patient qualifies for  BIPAP and likely needs it long term due to morbid obesity and OSA overlap  -have placed BIPAP for home use - rhinovirus is most likely cause of ground glass opacities on CT chest but this would not explain the hypercapnic failure - I can follow up with her on outpatient to check settings and make adjustments -PCO2 >55 on ABG Repeating ABG today and CXR today  -O2 weaned to 2L/min [place]       Thank you for allowing me to participate in the care of this patient.   Patient/Family are satisfied with care plan and all questions have been answered.    Provider disclosure: Patient with at least one acute or chronic illness or injury that poses a threat to life or bodily function and is being managed actively during this encounter.  All of the below services have been performed independently by signing provider:  review of prior documentation from internal and or external health records.  Review of previous and current lab results.  Interview and comprehensive assessment during patient visit today. Review of current and previous chest radiographs/CT scans. Discussion of management and test interpretation with health care team and patient/family.   This document was prepared using Dragon voice recognition software and may include unintentional dictation errors.     Deniel Mcquiston, M.D.  Division of Pulmonary & Critical Care Medicine                   [1] Social History Tobacco Use   Smoking status: Former    Types: Cigarettes   Smokeless tobacco: Never  Vaping Use   Vaping status: Every Day  Substance Use Topics   Alcohol use: No   Drug use: No  [2]  Current Facility-Administered Medications:    acetaminophen  (TYLENOL ) tablet 650 mg, 650 mg, Oral, Q6H PRN, 650 mg at 01/04/24 1726 **OR** acetaminophen  (TYLENOL ) suppository 650 mg, 650 mg, Rectal, Q6H PRN, Foust, Katy L, NP   albuterol  (PROVENTIL ) (2.5 MG/3ML) 0.083% nebulizer solution 2.5 mg, 2.5 mg, Nebulization,  Q2H PRN, Foust, Katy L, NP, 2.5 mg at 12/24/23 2040   apixaban  (ELIQUIS ) tablet 5 mg, 5 mg, Oral, BID, Jessup, Charles, MD, 5 mg at 01/05/24 9178   benztropine  (COGENTIN ) tablet 0.5 mg, 0.5 mg, Oral, Daily, 0.5 mg at 01/05/24 9177 **AND** benztropine  (COGENTIN ) tablet 1 mg, 1 mg, Oral, QHS, Franchot Novel, MD, 1 mg at 01/04/24 2142   dextromethorphan -guaiFENesin  (MUCINEX  DM) 30-600 MG per 12 hr tablet 1 tablet, 1 tablet, Oral, BID, Franchot Novel, MD, 1 tablet at 01/05/24 9177   divalproex  (DEPAKOTE ) DR tablet 1,000 mg, 1,000 mg, Oral, BID, Jessup, Charles, MD, 1,000 mg at 01/05/24 9178   escitalopram  (LEXAPRO ) tablet 10 mg, 10 mg, Oral, Daily, Jessup, Charles, MD, 10 mg at 01/05/24 9178   ferrous sulfate  tablet 325 mg, 325 mg, Oral, Q breakfast, Franchot Novel, MD, 325 mg at 01/05/24 9178   folic acid  (  FOLVITE ) tablet 1 mg, 1 mg, Oral, Daily, Jessup, Charles, MD, 1 mg at 01/05/24 9178   levETIRAcetam  (KEPPRA ) tablet 500 mg, 500 mg, Oral, BID, Jessup, Charles, MD, 500 mg at 01/05/24 9178   levothyroxine  (SYNTHROID ) tablet 50 mcg, 50 mcg, Oral, Q0600, Dail Rankin RAMAN, RPH, 50 mcg at 01/05/24 9452   LORazepam  (ATIVAN ) tablet 0.5 mg, 0.5 mg, Oral, QHS, Franchot Novel, MD, 0.5 mg at 01/04/24 2143   midodrine  (PROAMATINE ) tablet 5 mg, 5 mg, Oral, TID WC, Jens Durand, MD, 5 mg at 01/05/24 9178   ondansetron  (ZOFRAN ) tablet 4 mg, 4 mg, Oral, Q6H PRN, 4 mg at 01/05/24 0956 **OR** ondansetron  (ZOFRAN ) injection 4 mg, 4 mg, Intravenous, Q6H PRN, Foust, Katy L, NP, 4 mg at 12/28/23 1800   pantoprazole  (PROTONIX ) EC tablet 80 mg, 80 mg, Oral, Daily, Jessup, Charles, MD, 80 mg at 01/05/24 0821   polyethylene glycol (MIRALAX  / GLYCOLAX ) packet 17 g, 17 g, Oral, Daily, Jens Durand, MD, 17 g at 01/05/24 9176   QUEtiapine  (SEROQUEL ) tablet 12.5 mg, 12.5 mg, Oral, QHS, Franchot Novel, MD, 12.5 mg at 01/04/24 2143   senna-docusate (Senokot-S) tablet 1 tablet, 1 tablet, Oral, QHS  PRN, Ayiku, Bernard, MD, 1 tablet at 01/02/24 2233   traZODone  (DESYREL ) tablet 75 mg, 75 mg, Oral, QHS, Franchot Novel, MD, 75 mg at 01/04/24 2142   Xanomeline-Trospium  Chloride 125-30 MG CAPS 1 capsule, 1 capsule, Oral, BID, Niels Kayla FALCON, [facility], 1 capsule at 01/05/24 9176 "

## 2024-01-09 ENCOUNTER — Encounter: Payer: Self-pay | Admitting: Emergency Medicine

## 2024-01-09 ENCOUNTER — Other Ambulatory Visit: Payer: Self-pay

## 2024-01-09 ENCOUNTER — Emergency Department
Admission: EM | Admit: 2024-01-09 | Discharge: 2024-01-15 | Disposition: A | Payer: MEDICAID | Attending: Emergency Medicine | Admitting: Emergency Medicine

## 2024-01-09 DIAGNOSIS — I10 Essential (primary) hypertension: Secondary | ICD-10-CM | POA: Diagnosis not present

## 2024-01-09 DIAGNOSIS — R45851 Suicidal ideations: Secondary | ICD-10-CM | POA: Diagnosis not present

## 2024-01-09 DIAGNOSIS — Z7901 Long term (current) use of anticoagulants: Secondary | ICD-10-CM | POA: Diagnosis not present

## 2024-01-09 DIAGNOSIS — F79 Unspecified intellectual disabilities: Secondary | ICD-10-CM | POA: Diagnosis present

## 2024-01-09 DIAGNOSIS — F4325 Adjustment disorder with mixed disturbance of emotions and conduct: Secondary | ICD-10-CM | POA: Diagnosis present

## 2024-01-09 DIAGNOSIS — F333 Major depressive disorder, recurrent, severe with psychotic symptoms: Secondary | ICD-10-CM | POA: Insufficient documentation

## 2024-01-09 DIAGNOSIS — Z87891 Personal history of nicotine dependence: Secondary | ICD-10-CM | POA: Insufficient documentation

## 2024-01-09 DIAGNOSIS — Z79899 Other long term (current) drug therapy: Secondary | ICD-10-CM | POA: Diagnosis not present

## 2024-01-09 DIAGNOSIS — F3481 Disruptive mood dysregulation disorder: Secondary | ICD-10-CM | POA: Diagnosis present

## 2024-01-09 DIAGNOSIS — F70 Mild intellectual disabilities: Secondary | ICD-10-CM | POA: Diagnosis not present

## 2024-01-09 DIAGNOSIS — F17210 Nicotine dependence, cigarettes, uncomplicated: Secondary | ICD-10-CM | POA: Diagnosis not present

## 2024-01-09 LAB — CBC
HCT: 42.2 % (ref 36.0–46.0)
Hemoglobin: 13.1 g/dL (ref 12.0–15.0)
MCH: 26.7 pg (ref 26.0–34.0)
MCHC: 31 g/dL (ref 30.0–36.0)
MCV: 85.9 fL (ref 80.0–100.0)
Platelets: 212 K/uL (ref 150–400)
RBC: 4.91 MIL/uL (ref 3.87–5.11)
RDW: 22.4 % — ABNORMAL HIGH (ref 11.5–15.5)
WBC: 8 K/uL (ref 4.0–10.5)
nRBC: 0 % (ref 0.0–0.2)

## 2024-01-09 LAB — COMPREHENSIVE METABOLIC PANEL WITH GFR
ALT: 14 U/L (ref 0–44)
AST: 15 U/L (ref 15–41)
Albumin: 3.8 g/dL (ref 3.5–5.0)
Alkaline Phosphatase: 64 U/L (ref 38–126)
Anion gap: 13 (ref 5–15)
BUN: 14 mg/dL (ref 6–20)
CO2: 23 mmol/L (ref 22–32)
Calcium: 9.6 mg/dL (ref 8.9–10.3)
Chloride: 103 mmol/L (ref 98–111)
Creatinine, Ser: 1.09 mg/dL — ABNORMAL HIGH (ref 0.44–1.00)
GFR, Estimated: >60 mL/min
Glucose, Bld: 118 mg/dL — ABNORMAL HIGH (ref 70–99)
Potassium: 3.9 mmol/L (ref 3.5–5.1)
Sodium: 140 mmol/L (ref 135–145)
Total Bilirubin: 0.3 mg/dL (ref 0.0–1.2)
Total Protein: 7.5 g/dL (ref 6.5–8.1)

## 2024-01-09 LAB — URINE DRUG SCREEN
Amphetamines: POSITIVE — AB
Barbiturates: NEGATIVE
Benzodiazepines: NEGATIVE
Cocaine: NEGATIVE
Fentanyl: NEGATIVE
Methadone Scn, Ur: NEGATIVE
Opiates: NEGATIVE
Tetrahydrocannabinol: NEGATIVE

## 2024-01-09 LAB — ETHANOL: Alcohol, Ethyl (B): <15 mg/dL

## 2024-01-09 LAB — POC URINE PREG, ED: Preg Test, Ur: NEGATIVE

## 2024-01-09 NOTE — ED Notes (Signed)
 Report given to [employer]. Pt transported to [facility] via wheelchair with security and NT at this time.

## 2024-01-09 NOTE — ED Provider Notes (Signed)
 "  [facility] Provider Note    Event Date/Time   First MD Initiated Contact with Patient 01/09/24 2126     (approximate)   History   Suicidal   HPI  Sherry Bryan is a 28 y.o. female with a history of schizoaffective disorder, PTSD, intellectual disability, seizure disorder, hypertension, and obesity who presents with suicidal ideation which she states that has been persistent since she left the hospital last week.  The patient states that she has considered overdosing on her medications or cutting herself.  She states she has cut herself in the past.  She states that she has been compliant with her prescribed medications.  She denies any acute medical complaints.  I reviewed the past medical records.  The patient was just admitted to the hospitalist service for an acute respiratory infection with hypoxia, diagnosed as rhinovirus infection with superimposed pneumonia.  She was most recently evaluated by psychiatry on 12/25 with SI.  She was cleared during that admission and did not require inpatient psychiatric management.   Physical Exam   Triage Vital Signs: ED Triage Vitals [01/09/24 1931]  Encounter Vitals Group     BP (!) 113/99     Girls Systolic BP Percentile      Girls Diastolic BP Percentile      Boys Systolic BP Percentile      Boys Diastolic BP Percentile      Pulse Rate 100     Resp 18     Temp 98.1 F (36.7 C)     Temp Source Oral     SpO2 100 %     Weight      Height      Head Circumference      Peak Flow      Pain Score 0     Pain Loc      Pain Education      Exclude from Growth Chart     Most recent vital signs: Vitals:   01/09/24 1931  BP: (!) 113/99  Pulse: 100  Resp: 18  Temp: 98.1 F (36.7 C)  SpO2: 100%     General: Alert, no distress.  CV:  Good peripheral perfusion.  Resp:  Normal effort.  Abd:  No distention.  Other:  Somewhat flat affect.  Calm and cooperative.   ED Results / Procedures / Treatments    Labs (all labs ordered are listed, but only abnormal results are displayed) Labs Reviewed  COMPREHENSIVE METABOLIC PANEL WITH GFR - Abnormal; Notable for the following components:      Result Value   Glucose, Bld 118 (*)    Creatinine, Ser 1.09 (*)    All other components within normal limits  CBC - Abnormal; Notable for the following components:   RDW 22.4 (*)    All other components within normal limits  URINE DRUG SCREEN - Abnormal; Notable for the following components:   Amphetamines POSITIVE (*)    All other components within normal limits  ETHANOL  POC URINE PREG, ED     EKG     RADIOLOGY    PROCEDURES:  Critical Care performed: No  Procedures   MEDICATIONS ORDERED IN ED: Medications - No data to display   IMPRESSION / MDM / ASSESSMENT AND PLAN / ED COURSE  I reviewed the triage vital signs and the nursing notes.  28 year old female with PMH as noted above presents with persistent SI.  Differential diagnosis includes, but is not limited to, major depressive disorder, schizoaffective  disorder, adjustment disorder, substance-induced mood disorder.  Patient's presentation is most consistent with exacerbation of chronic illness.  Lab workup obtained for medical clearance is unremarkable.  CMP and CBC show no acute findings.  Ethanol is negative.  UDS is positive for amphetamines.  Psychiatry and TTS consults have been ordered.  The patient has been placed in psychiatric observation due to the need to provide a safe environment for the patient while obtaining psychiatric consultation and evaluation, as well as ongoing medical and medication management to treat the patient's condition.  The patient has not been placed under full IVC at this time.   ----------------------------------------- 11:18 PM on 01/09/2024 -----------------------------------------  Psychiatry consult is pending.  The patient has been signed out to the oncoming ED physician Dr.  Gordan   FINAL CLINICAL IMPRESSION(S) / ED DIAGNOSES   Final diagnoses:  Suicidal ideation     Rx / DC Orders   ED Discharge Orders     None        Note:  This document was prepared using Dragon voice recognition software and may include unintentional dictation errors.    Jacolyn Pae, MD 01/09/24 2318  "

## 2024-01-09 NOTE — ED Notes (Signed)
 Attempted to call pt's legal guardian, Rosaline Hoover @ [phone]; no answer, voicemail left w/ callback number.

## 2024-01-09 NOTE — ED Notes (Signed)
 Pt moved to BHU5 from ED. Oriented to unit and expectations explained. Pt verbalized understanding.

## 2024-01-09 NOTE — ED Notes (Signed)
vol/psych consult ordered/pending.. 

## 2024-01-09 NOTE — ED Notes (Signed)
 Pt brought back from triage. Ambulatory to hallway bed. Continues to endorse SI without plan. Is not tearful. Is clam and cooperative with staff. Was offered water. Denies any additional needs.

## 2024-01-09 NOTE — ED Notes (Signed)
 Pt changed into safety scrubs in the presence of this RN and Ariel, NT; no obvious signs of trauma noted. Pt allowed to leave personal brief on. Inseams checked by this RN. Belongings collected which include: Shoes Socks Pants Shirt Bra Puffer jacket  Hair tye

## 2024-01-09 NOTE — ED Triage Notes (Signed)
 Pt arrives from [facility] group [facility] reporting SI without plan or attempt. Pt reports it is getting close to the anniversary of her grandmothers passing triggering these feelings/thoughts.

## 2024-01-10 DIAGNOSIS — F333 Major depressive disorder, recurrent, severe with psychotic symptoms: Secondary | ICD-10-CM

## 2024-01-10 DIAGNOSIS — R45851 Suicidal ideations: Secondary | ICD-10-CM

## 2024-01-10 MED ORDER — DEUTETRABENAZINE ER 24 MG PO TB24: 1.0000 | ORAL_TABLET | Freq: Every day | ORAL | Status: DC

## 2024-01-10 MED ORDER — QUETIAPINE FUMARATE 25 MG PO TABS
12.5000 mg | ORAL_TABLET | Freq: Every day | ORAL | Status: DC
  Administered 2024-01-10 – 2024-01-14 (×5): 12.5 mg via ORAL
  Filled 2024-01-10 (×5): qty 1

## 2024-01-10 MED ORDER — TRAZODONE HCL 50 MG PO TABS
75.0000 mg | ORAL_TABLET | Freq: Every day | ORAL | Status: DC
  Administered 2024-01-10 – 2024-01-14 (×5): 75 mg via ORAL
  Filled 2024-01-10 (×5): qty 2

## 2024-01-10 MED ORDER — ALBUTEROL SULFATE (2.5 MG/3ML) 0.083% IN NEBU: 3.0000 mL | INHALATION_SOLUTION | Freq: Four times a day (QID) | RESPIRATORY_TRACT | Status: DC | PRN

## 2024-01-10 MED ORDER — ESCITALOPRAM OXALATE 10 MG PO TABS
10.0000 mg | ORAL_TABLET | Freq: Every day | ORAL | Status: DC
  Administered 2024-01-10 – 2024-01-15 (×6): 10 mg via ORAL
  Filled 2024-01-10 (×6): qty 1

## 2024-01-10 MED ORDER — APIXABAN 5 MG PO TABS
5.0000 mg | ORAL_TABLET | Freq: Two times a day (BID) | ORAL | Status: DC
  Administered 2024-01-10 – 2024-01-15 (×10): 5 mg via ORAL
  Filled 2024-01-10 (×10): qty 1

## 2024-01-10 MED ORDER — LEVOTHYROXINE SODIUM 50 MCG PO TABS
50.0000 ug | ORAL_TABLET | Freq: Every day | ORAL | Status: DC
  Administered 2024-01-11 – 2024-01-15 (×5): 50 ug via ORAL
  Filled 2024-01-10: qty 2
  Filled 2024-01-10 (×4): qty 1

## 2024-01-10 NOTE — BH Assessment (Signed)
 Comprehensive Clinical Assessment (CCA) Screening, Triage and Referral Note  01/10/2024 Sherry Bryan [id number] Recommendations for Services/Supports/Treatments: Psych consult/Disposition pending. Sherry Bryan is a 28 y.o., Caucasian, Not Hispanic or Latino ethnicity, ENGLISH speaking female. Pt is voluntary. Per triage note: Pt arrives from [facility] group [facility] reporting SI without plan or attempt. Pt reports it is getting close to the anniversary of her grandmothers passing triggering these feelings/thoughts.  Pt was sleeping soundly upon this writer's arrival. Pt presented with clear and coherent speech. Pt presented with a somber mood; affect was congruent. Pt was lethargic throughout the assessment. Pt explained that she'd presented to the hospital due to having a hard time due to it being the anniversary of her grandmother's passing. Pt did not have a concrete plan of the ways in which she'd carry out her thoughts of SI. Pt identified her stressors as struggling with her grief. Pt endorsed having symptoms of depression for the last few weeks. Pd denied substance abuse. Pt denied current HI and AV/H. Chief Complaint:  Chief Complaint  Patient presents with   Suicidal   Visit Diagnosis: Suicidal ideations   Patient Reported Information How did you hear about us ? Self  What Is the Reason for Your Visit/Call Today? Patient states my thoughts got worse  How Long Has This Been Causing You Problems? 1-6 months  What Do You Feel Would Help You the Most Today? Treatment for Depression or other mood problem   Have You Recently Had Any Thoughts About Hurting Yourself? No  Are You Planning to Commit Suicide/Harm Yourself At This time? No   Have you Recently Had Thoughts About Hurting Someone Sherral? No  Are You Planning to Harm Someone at This Time? No  Explanation: Pt endorses having thoughts of suicide with a plan to cut herself.   Have You Used Any Alcohol or Drugs in the  Past 24 Hours? No  How Long Ago Did You Use Drugs or Alcohol? No data recorded What Did You Use and How Much? No data recorded  Do You Currently Have a Therapist/Psychiatrist? Yes  Name of Therapist/Psychiatrist: via group home   Have You Been Recently Discharged From Any Office Practice or Programs? No  Explanation of Discharge From Practice/Program: N/A    CCA Screening Triage Referral Assessment Type of Contact: Face-to-Face  Telemedicine Service Delivery:   Is this Initial or Reassessment?   Date Telepsych consult ordered in [facility]:    Time Telepsych consult ordered in [facility]:    Location of Assessment: [facility] ED  Provider Location: [facility] ED    Collateral Involvement: None provided   Does Patient Have a Court Appointed Legal Guardian? No data recorded Name and Contact of Legal Guardian: No data recorded If Minor and Not Living with Parent(s), Who has Custody? N/A  Is CPS involved or ever been involved? Never  Is APS involved or ever been involved? Never   Patient Determined To Be At Risk for Harm To Self or Others Based on Review of Patient Reported Information or Presenting Complaint? No  Method: Plan without intent  Availability of Means: No access or NA  Intent: Vague intent or NA  Notification Required: No need or identified person  Additional Information for Danger to Others Potential: Previous attempts  Additional Comments for Danger to Others Potential: n/a  Are There Guns or Other Weapons in Your Home? No  Types of Guns/Weapons: n/a  Are These Weapons Safely Secured?  No  Who Could Verify You Are Able To Have These Secured: n/a  Do You Have any Outstanding Charges, Pending Court Dates, Parole/Probation? None Reported  Contacted To Inform of Risk of Harm To Self or Others: -- (n/a)   Does Patient Present under Involuntary Commitment? No    County of Residence: [place]   Patient Currently Receiving the Following  Services: Group Home   Determination of Need: Emergent (2 hours)   Options For Referral: ED Visit   Disposition Recommendation per psychiatric provider: Pending psych consult/disposition  Sherry Bryan R Sherry Bryan, LCAS

## 2024-01-10 NOTE — ED Notes (Signed)
 VOL per NP psych inpt admit

## 2024-01-10 NOTE — ED Notes (Signed)
 Pt stated that they have been depressed at home because the 7th anniversary of their nana's death is coming up and their staff at the home where they live aren't treating her well. They would say that she doesn't listen. She's been anxious today and is glad that she isn't going back to the home today.

## 2024-01-10 NOTE — ED Notes (Signed)
Patient in day room. 

## 2024-01-10 NOTE — ED Notes (Addendum)
 RN spoke with legal guardian [facility] DSS Lorrin Rollings) about plan of care and recommendation of IP psych treatment.

## 2024-01-10 NOTE — Progress Notes (Signed)
 Per [facility]),  patient has been referred to the following facilities:  Service Provider Phone  [facility]  [phone]  [facility]  [phone]  [facility]  [phone]  [facility]  [phone]  [facility]  [phone]  [facility]  [phone]  [facility]  [phone]  [place] [facility]  [phone]  [facility]  [phone]  [facility]  [phone]  [facility]  [phone]  [facility]  [phone]  [facility]  [phone]  [facility] [phone]  [facility]  [phone]  [facility]  [phone]  [facility]  [phone]    Floella Ensz, [place] [phone]

## 2024-01-10 NOTE — Consult Note (Signed)
 [name]  Patient Name: Sherry Bryan MRN: [id number] DOB: Jun 15, 1995 DATE OF Consult: 01/10/2024 Consult Order details:  Orders (From admission, onward)     Start     Ordered   01/09/24 2127  CONSULT TO CALL ACT TEAM       Ordering Provider: Jacolyn Pae, MD  Provider:  (Not yet assigned)  Question:  Reason for Consult?  Answer:  Psych consult   01/09/24 2126   01/09/24 2127  IP CONSULT TO PSYCHIATRY       Ordering Provider: Jacolyn Pae, MD  Provider:  (Not yet assigned)  Question Answer Comment  Reason for consult: Other (see comments)   Comments: SI      01/09/24 2126            PRIMARY PSYCHIATRIC DIAGNOSES  1.  Major depressive disorder, recurrent, severe with psychotic features  2.  Suicidal ideations   RECOMMENDATIONS  Formulation: 28 year old woman with extensive psychiatric history (MDD, DMDD, BPD, PTSD, adjustment disorder, intellectual disability) and medical comorbidities presents with active suicidal ideation with intent and plan, accompanied by auditory and visual hallucinations commanding self-harm, in the context of the anniversary of her mothers death. She endorses severe depressive symptoms (hopelessness, worthlessness) and was recently discharged from inpatient care 4 days ago with rapid decompensation. She reports inability to maintain safety in her group home due to lack of supervision and recurrent elopement. Outpatient engagement is poor. Given acute suicide risk, psychotic symptoms, lack of a safe environment, poor outpatient adherence, and recent failed lower level of care, she requires inpatient psychiatric admission for safety, stabilization, medication management, and diagnostic clarification.  Admit to inpatient psych for safety and stabilization.  Medication recommendations:   Please contact group home for medication list, reconcile patient's home medications and continue them as  prescribed.  Non-Medication/therapeutic recommendations: Monitor for ongoing suicidal thoughts and implement safety precautions as indicated. Refer to outpatient psychiatric provider for medication management and therapy upon discharge from inpatient psych admission.   Communication: Treatment team members (and family members if applicable) who were involved in treatment/care discussions and planning, and with whom we spoke or engaged with via secure text/chat, include the following: patient's treatment team.  I personally spent a total of 45 minutes in the care of the patient today including preparing to see the patient, getting/reviewing separately obtained history, counseling and educating, referring and communicating with other health care professionals, documenting clinical information in the EHR, and performing psych eval.  Thank you for involving us  in the care of this patient. If you have any additional questions or concerns, please call [phone] and ask for me or the provider on-call.  TELEPSYCHIATRY ATTESTATION & CONSENT  As the provider for this telehealth consult, I attest that I verified the patients identity using two separate identifiers, introduced myself to the patient, provided my credentials, disclosed my location, and performed this encounter via a HIPAA-compliant, real-time, face-to-face, two-way, interactive audio and video platform and with the full consent and agreement of the patient (or guardian as applicable.)  Patient physical location: [place]. Telehealth provider physical location: home office in state of [place].  Video start time: 0630 (Central Time) Video end time: 0645 (Central Time)  IDENTIFYING DATA  Sherry Bryan is a 28 y.o. year-old female for whom a psychiatric consultation has been ordered by the primary provider. The patient was identified using two separate identifiers.  CHIEF COMPLAINT/REASON FOR CONSULT  I'm feeling suicidal.  HISTORY OF  PRESENT ILLNESS (HPI)  A 28 year old woman  with a history history of hypertension, hypothyroidism, seizure disorder, GERD, prior blood clots on Eliquis , major depressive disorder, DMDD, intellectual disability, borderline personality disorder, PTSD, and adjustment disorder  presented to the hospital with active suicidal ideation, expressing intent to harm herself by ingesting pills and self-inflicted cutting. The onset of these thoughts was temporally associated with the anniversary of a significant loss, specifically the death of her mother five years prior. Profound feelings of depression, hopelessness, and worthlessness were endorsed.   Auditory and visual hallucinations were described, including hearing her mother's voice commanding her to end her life and seeing shadows and black dots. These psychotic symptoms appear to be persistent and directly related to her current emotional distress.   Recent psychiatric history includes discharge from inpatient care four days prior to this presentation. She resides in a group home with two other individuals, but reported an absence of staff presence and difficulty maintaining her own safety in that environment. The patient acknowledged recurrent episodes of leaving the group home without permission, which she attributed to negative interactions with staff. This behavior appears to have contributed to the current ER presentation. When asked how another inpatient admission would be beneficial, the patient stated, This is my last straw because I keep running away.  Outpatient therapy engagement has been minimal; she last attended a session one to two months ago and expressed ambivalence toward therapy, stating a dislike for participating and uncertainty about scheduling. No proactive attempts to request therapy were made.    PAST PSYCHIATRIC HISTORY  Per chart review: Prev Dx/Sx: Bipolar disorder, Major depressive disorder, and Generalized anxiety disorder ,  IDD Current Psych Provider: [facility]  Previous Med Trials: Invega, Haldol, Zyprexa , Abilify, Vraylar, Rexulti per patient report  Therapy: has not been going to therapy   Prior Psych Hospitalization: Yes  Prior Self Harm: Yes Prior Violence: Yes Otherwise as per HPI above.  PAST MEDICAL HISTORY  Past Medical History:  Diagnosis Date   Borderline personality disorder (HCC)    Constipation 05/15/2015   Depression    DMDD (disruptive mood dysregulation disorder) 05/15/2015   History of seasonal allergies 05/15/2015   Hx of gastroesophageal reflux (GERD) 05/15/2015   Hx of seizure disorder 05/15/2015   Intellectual disability 05/22/2015   PTSD (post-traumatic stress disorder)    Seizures (HCC)    last one in 2015   Suicidal ideation      HOME MEDICATIONS  PTA Medications  Medication Sig   meclizine  (ANTIVERT ) 25 MG tablet Take 25 mg by mouth 3 (three) times daily as needed for nausea.   Multiple Vitamins-Minerals (MULTIVITAMIN ADULTS) TABS Take 1 tablet by mouth daily.   levETIRAcetam  (KEPPRA ) 500 MG tablet Take 500 mg by mouth 2 (two) times daily.   VENTOLIN  HFA 108 (90 Base) MCG/ACT inhaler Inhale 2 puffs into the lungs every 6 (six) hours as needed for wheezing or shortness of breath.   omeprazole (PRILOSEC) 40 MG capsule Take 40 mg by mouth daily.   folic acid  (FOLVITE ) 1 MG tablet Take 1 mg by mouth daily.   levothyroxine  (SYNTHROID ) 50 MCG tablet Take 50 mcg by mouth daily before breakfast.   apixaban  (ELIQUIS ) 5 MG TABS tablet Take 5 mg by mouth 2 (two) times daily.   escitalopram  (LEXAPRO ) 10 MG tablet Take 10 mg by mouth daily.   loperamide  (IMODIUM  A-D) 2 MG tablet Take 1 tablet (2 mg total) by mouth 4 (four) times daily as needed for diarrhea or loose stools.   Xanomeline-Trospium  Chloride 125-30  MG CAPS Take 1 capsule by mouth 2 (two) times daily.   benztropine  (COGENTIN ) 1 MG tablet Take 1 mg by mouth 2 (two) times daily.   divalproex  (DEPAKOTE ) 500 MG DR tablet Take 1,000  mg by mouth 2 (two) times daily.   UZEDY  100 MG/0.28ML syringe Inject 100 mg into the skin every 30 (thirty) days.   LORazepam  (ATIVAN ) 1 MG tablet Take 0.5 tablets (0.5 mg total) by mouth at bedtime.   trazodone  (DESYREL ) 50 MG tablet Take 1.5 tablets (75 mg total) by mouth at bedtime.   ferrous sulfate  325 (65 FE) MG tablet Take 1 tablet (325 mg total) by mouth every other day.   midodrine  (PROAMATINE ) 5 MG tablet Take 1 tablet (5 mg total) by mouth 3 (three) times daily with meals.   QUEtiapine  (SEROQUEL ) 25 MG tablet Take 0.5 tablets (12.5 mg total) by mouth at bedtime.     ALLERGIES  Allergies[1]  SOCIAL & SUBSTANCE USE HISTORY  Social History   Socioeconomic History   Marital status: Single    Spouse name: Not on file   Number of children: Not on file   Years of education: Not on file   Highest education level: Not on file  Occupational History   Not on file  Tobacco Use   Smoking status: Former    Types: Cigarettes   Smokeless tobacco: Never  Vaping Use   Vaping status: Every Day  Substance and Sexual Activity   Alcohol use: No   Drug use: No   Sexual activity: Never  Other Topics Concern   Not on file  Social History Narrative   Not on file   Social Drivers of Health   Tobacco Use: Medium Risk (01/09/2024)   Patient History    Smoking Tobacco Use: Former    Smokeless Tobacco Use: Never    Passive Exposure: Not on Actuary Strain: Not on file  Food Insecurity: No Food Insecurity (12/19/2023)   Epic    Worried About Programme Researcher, Broadcasting/film/video in the Last Year: Never true    Ran Out of Food in the Last Year: Never true  Transportation Needs: Patient Unable To Answer (12/20/2023)   Epic    Lack of Transportation (Medical): Patient unable to answer    Lack of Transportation (Non-Medical): Patient unable to answer  Physical Activity: Not on file  Stress: Not on file  Social Connections: Not on file  Depression (EYV7-0): Not on file  Alcohol Screen:  Not on file  Housing: Patient Unable To Answer (12/20/2023)   Epic    Unable to Pay for Housing in the Last Year: Patient unable to answer    Number of Times Moved in the Last Year: Not on file    Homeless in the Last Year: Patient unable to answer  Utilities: Not At Risk (12/19/2023)   Epic    Threatened with loss of utilities: No  Health Literacy: Not on file   Tobacco Use History[2] Social History   Substance and Sexual Activity  Alcohol Use No   Social History   Substance and Sexual Activity  Drug Use No    She resides in [place] in a group home with two other individuals. There is no group home staff present at all times. Recent history includes running away from the group home, which she attributed to issues with staff. She was discharged from inpatient care four days prior to this encounter.  FAMILY HISTORY  History reviewed. No pertinent family history.  Family Psychiatric History (if known):  per chart review patient has a family history of bipolar disorder, ADHD, ODD, and substance abuse in her mother   MENTAL STATUS EXAM (MSE)  Mental Status Exam: General Appearance: sitting in bed wearing hospital scrubs  Orientation:  Full (Time, Place, and Person)  Memory:  intact   Concentration:  intact   Recall:  intact   Attention  Fair  Eye Contact:  Poor  Speech:  Clear and Coherent  Language:  Good  Volume:  Normal  Mood: Depressed and hopeless   Affect:  appears subdued and congruent with the content discussed  Thought Process:  Coherent  Thought Content:  Hallucinations: Auditory Visual  Suicidal Thoughts:  Yes.  with intent/plan  Homicidal Thoughts:  No  Judgement:  Impaired  Insight:  Lacking  Psychomotor Activity:  Normal  Akathisia:  Negative  Fund of Knowledge:  Fair    Assets:  Communication Skills Desire for Improvement Housing Social Support  Cognition:  WNL  ADL's:  Intact  AIMS (if indicated):       VITALS  Blood pressure (!) 113/99,  pulse 100, temperature 98.1 F (36.7 C), temperature source Oral, resp. rate 18, SpO2 100%.  LABS  Admission on 01/09/2024  Component Date Value Ref Range Status   Sodium 01/09/2024 140  135 - 145 mmol/L Final   Potassium 01/09/2024 3.9  3.5 - 5.1 mmol/L Final   Chloride 01/09/2024 103  98 - 111 mmol/L Final   CO2 01/09/2024 23  22 - 32 mmol/L Final   Glucose, Bld 01/09/2024 118 (H)  70 - 99 mg/dL Final   Glucose reference range applies only to samples taken after fasting for at least 8 hours.   BUN 01/09/2024 14  6 - 20 mg/dL Final   Creatinine, Ser 01/09/2024 1.09 (H)  0.44 - 1.00 mg/dL Final   Calcium 87/69/7974 9.6  8.9 - 10.3 mg/dL Final   Total Protein 87/69/7974 7.5  6.5 - 8.1 g/dL Final   Albumin 87/69/7974 3.8  3.5 - 5.0 g/dL Final   AST 87/69/7974 15  15 - 41 U/L Final   ALT 01/09/2024 14  0 - 44 U/L Final   Alkaline Phosphatase 01/09/2024 64  38 - 126 U/L Final   Total Bilirubin 01/09/2024 0.3  0.0 - 1.2 mg/dL Final   GFR, Estimated 01/09/2024 >60  >60 mL/min Final   Comment: (NOTE) Calculated using the CKD-EPI Creatinine Equation (2021)    Anion gap 01/09/2024 13  5 - 15 Final   Performed at [facility], [address] [place], [place] [address]   Alcohol, Ethyl (B) 01/09/2024 <15  <15 mg/dL Final   Comment: (NOTE) For medical purposes only. Performed at [facility], [address] [place], [place] [address]    WBC 01/09/2024 8.0  4.0 - 10.5 K/uL Final   RBC 01/09/2024 4.91  3.87 - 5.11 MIL/uL Final   Hemoglobin 01/09/2024 13.1  12.0 - 15.0 g/dL Final   HCT 87/69/7974 42.2  36.0 - 46.0 % Final   MCV 01/09/2024 85.9  80.0 - 100.0 fL Final   MCH 01/09/2024 26.7  26.0 - 34.0 pg Final   MCHC 01/09/2024 31.0  30.0 - 36.0 g/dL Final   RDW 87/69/7974 22.4 (H)  11.5 - 15.5 % Final   Platelets 01/09/2024 212  150 - 400 K/uL Final   nRBC 01/09/2024 0.0  0.0 - 0.2 % Final   Performed at [facility], [address] [place],  [place]  [address]   Opiates 01/09/2024 NEGATIVE  NEGATIVE Final   Cocaine 01/09/2024 NEGATIVE  NEGATIVE Final   Benzodiazepines 01/09/2024 NEGATIVE  NEGATIVE Final   Amphetamines 01/09/2024 POSITIVE (A)  NEGATIVE Final   Tetrahydrocannabinol 01/09/2024 NEGATIVE  NEGATIVE Final   Barbiturates 01/09/2024 NEGATIVE  NEGATIVE Final   Methadone Scn, Ur 01/09/2024 NEGATIVE  NEGATIVE Final   Fentanyl  01/09/2024 NEGATIVE  NEGATIVE Final   Comment: (NOTE) Drug screen is for Medical Purposes only. Positive results are preliminary only. If confirmation is needed, notify lab within 5 days.  Drug Class                 Cutoff (ng/mL) Amphetamine and metabolites 1000 Barbiturate and metabolites 200 Benzodiazepine              200 Opiates and metabolites     300 Cocaine and metabolites     300 THC                         50 Fentanyl                     5 Methadone                   300  Trazodone  is metabolized in vivo to several metabolites,  including pharmacologically active m-CPP, which is excreted in the  urine.  Immunoassay screens for amphetamines and MDMA have potential  cross-reactivity with these compounds and may provide false positive  result.  Performed at [facility], [address] [place], [place] [address]    Preg Test, Ur 01/09/2024 NEGATIVE  NEGATIVE Final   Comment:        THE SENSITIVITY OF THIS METHODOLOGY IS >20 mIU/mL.     PSYCHIATRIC REVIEW OF SYSTEMS (ROS)  - Active suicidal ideation with plan to overdose and self-harm. - Feelings of hopelessness and worthlessness. - Auditory hallucinations (hearing mother's voice commanding self-harm). - Visual hallucinations (seeing shadows and black dots). - Recent inpatient psychiatric admission with discharge four days prior. - Poor engagement with outpatient therapy; last session one to two months ago. - History of running away from group home.  Additional findings:      Musculoskeletal: No abnormal movements observed       Gait & Station: Laying/Sitting      Pain Screening: Denies      Nutrition & Dental Concerns: none reported  RISK FORMULATION/ASSESSMENT  Is the patient experiencing any suicidal or homicidal ideations: Yes       Explain if yes: Active suicidal ideation with plan to overdose and self-harm. Protective factors considered for safety management: access to medical care  Risk factors/concerns considered for safety management:  Prior attempt Depression Access to lethal means Hopelessness Impulsivity Unmarried  Is there a safety management plan with the patient and treatment team to minimize risk factors and promote protective factors: Yes           Explain: admit to psych Is crisis care placement or psychiatric hospitalization recommended: Yes     Based on my current evaluation and risk assessment, patient is determined at this time to be at:  High risk  *RISK ASSESSMENT Risk assessment is a dynamic process; it is possible that this patient's condition, and risk level, may change. This should be re-evaluated and managed over time as appropriate. Please re-consult psychiatric consult services if additional assistance is needed in terms of risk assessment and management. If your  team decides to discharge this patient, please advise the patient how to best access emergency psychiatric services, or to call 911, if their condition worsens or they feel unsafe in any way.   Tayshawn Purnell Velna, NP [facility] Consult Services    [1]  Allergies Allergen Reactions   Ritalin [Methylphenidate Hcl] Other (See Comments)    seizures   Abilify [Aripiprazole] Other (See Comments)    Shaking or tremors   Lithium    [2]  Social History Tobacco Use  Smoking Status Former   Types: Cigarettes  Smokeless Tobacco Never

## 2024-01-10 NOTE — ED Notes (Signed)
 Pt given snack and water at this time. Pt denies further needs at this time.

## 2024-01-10 NOTE — ED Notes (Signed)
 Patient reports prune juice is starting to work. Some relief felt.

## 2024-01-10 NOTE — ED Notes (Signed)
 Gave patient 8 oz prune juice for reported constipation.

## 2024-01-10 NOTE — ED Provider Notes (Signed)
 Patient's home meds reordered.  Med tech to review as there does appear there is a medication change in the last discharge, at that point there was mention of discontinuing many of her blood pressure medicines.  We will certainly continue her on her anticoagulant.  Monitor blood pressures, at this time do not see it compelling reason to place the patient on midodrine  especially in light of the fact that she had recently discontinued blood pressure medicines   Dicky Anes, MD 01/10/24 1857

## 2024-01-11 NOTE — Progress Notes (Signed)
 IDD is the placement barrier.  Patient has been referred to the following facilities:  Service Provider Phone  [facility]  [phone]  [facility]  [phone]  [facility]  [phone]  [facility]  [phone]  [facility]  [phone]  [facility]  [phone]  [facility]  [phone]  [place] [facility]  [phone]  [facility]  [phone]  [facility]  [phone]  [facility]  [phone]  [facility]  [phone]  [facility]  [phone]  [facility]  [phone]  [facility]  [phone]  [facility]  [phone]  [facility]  [phone]    [place], [place] [phone]

## 2024-01-11 NOTE — ED Provider Notes (Signed)
 Emergency Medicine Observation Re-evaluation Note   BP 111/75   Pulse 89   Temp 97.7 F (36.5 C) (Oral)   Resp 14   SpO2 96%    ED Course / MDM   No reported events during my shift at the time of this note.   Pt is awaiting dispo from consultants   Ginnie Shams MD    Shams Ginnie, MD 01/11/24 [phone]

## 2024-01-11 NOTE — ED Notes (Signed)
 Hospital meal provided, pt tolerated w/o complaints.  Waste discarded appropriately.

## 2024-01-11 NOTE — ED Notes (Signed)
 Vol /admit to inpatient psych for safety and stablization

## 2024-01-11 NOTE — ED Notes (Signed)
 Meal tray provided. Tray checked for any potential hazards. Pt denies no additional needs at this time.

## 2024-01-11 NOTE — ED Notes (Signed)
 Lunch tray provided to pt.

## 2024-01-11 NOTE — ED Notes (Signed)
 Patient given snack at the bedside. No acute needs at this time.

## 2024-01-11 NOTE — ED Notes (Signed)
 RN taking over care of pt after receiving handoff. Pt reporting to ED d/t SI without plan. Pt ABCs intact. RR even and unlabored. Pt in NAD. Bed in lowest locked position. Denies needs at this time.   Past Medical History:  Diagnosis Date   Borderline personality disorder (HCC)    Constipation 05/15/2015   Depression    DMDD (disruptive mood dysregulation disorder) 05/15/2015   History of seasonal allergies 05/15/2015   Hx of gastroesophageal reflux (GERD) 05/15/2015   Hx of seizure disorder 05/15/2015   Intellectual disability 05/22/2015   PTSD (post-traumatic stress disorder)    Seizures (HCC)    last one in 2015   Suicidal ideation

## 2024-01-12 MED ORDER — ACETAMINOPHEN 500 MG PO TABS
1000.0000 mg | ORAL_TABLET | Freq: Once | ORAL | Status: AC
  Administered 2024-01-12: 1000 mg via ORAL
  Filled 2024-01-12: qty 2

## 2024-01-12 NOTE — ED Provider Notes (Signed)
 Emergency Medicine Observation Re-evaluation Note  Sherry Bryan is a 29 y.o. female, seen on rounds today.  Pt initially presented to the ED for complaints of Suicidal Currently, the patient is resting.  Physical Exam  BP (!) 132/90 (BP Location: Right Arm)   Pulse (!) 117   Temp 97.8 F (36.6 C) (Oral)   Resp 17   SpO2 96%  Physical Exam Gen:  No acute distress Resp:  Breathing easily and comfortably, no accessory muscle usage Neuro:  Moving all four extremities, no gross focal neuro deficits Psych:  Resting currently, calm when awake  ED Course / MDM  EKG:   I have reviewed the labs performed to date as well as medications administered while in observation.  Recent changes in the last 24 hours include no changes.  Plan  Current plan is for psych placement.    Gordan Huxley, MD 01/12/24 [phone]

## 2024-01-12 NOTE — ED Notes (Signed)
 Lunch tray provided to pt.

## 2024-01-12 NOTE — ED Notes (Signed)
VOL/pending psych inpatient admit  

## 2024-01-12 NOTE — ED Notes (Signed)
VOL/pending psych inpatient admission

## 2024-01-12 NOTE — ED Notes (Signed)
 Dinner tray provided to pt

## 2024-01-12 NOTE — ED Notes (Signed)
 Pt lying on stretcher with television on.  Italian ice provided with a cup of ice water.

## 2024-01-12 NOTE — ED Notes (Signed)
 Snacks tray given to pt

## 2024-01-12 NOTE — ED Notes (Signed)
Pt given breakfast tray and beverage.  

## 2024-01-12 NOTE — Progress Notes (Signed)
 IDD is the placement barrier.  Patient has been referred to the following facilities:  Service Provider Phone  [facility]  [phone]  [facility]  [phone]  [facility]  [phone]  [facility]  [phone]  [facility]  [phone]  [facility]  [phone]  [facility]  [phone]  [place] [facility]  [phone]  [facility]  [phone]  [facility]  [phone]  [facility]  [phone]  [facility]  [phone]  [facility]  [phone]  [facility]  [phone]  [facility]  [phone]  [facility]  [phone]  [facility]  [phone]    [place], [place] [phone]

## 2024-01-13 MED ORDER — HYDROXYZINE HCL 25 MG PO TABS
50.0000 mg | ORAL_TABLET | Freq: Four times a day (QID) | ORAL | Status: DC | PRN
  Administered 2024-01-13 – 2024-01-14 (×2): 50 mg via ORAL
  Filled 2024-01-13 (×2): qty 2

## 2024-01-13 NOTE — ED Provider Notes (Signed)
 Emergency Medicine Observation Re-evaluation Note  Sherry Bryan is a 29 y.o. female, seen on rounds today.  Pt initially presented to the ED for complaints of Suicidal Currently, the patient is resting  Physical Exam  BP 117/83 (BP Location: Right Wrist)   Pulse (!) 108   Temp 97.8 F (36.6 C) (Oral)   Resp 18   SpO2 94%  Physical Exam General: No acute distress Cardiac: Warm and well-perfused Lungs: No respiratory distress   ED Course / MDM  EKG:   I have reviewed the labs performed to date as well as medications administered while in observation.  Recent changes in the last 24 hours include no changes  Plan  Current plan is for psych placement    Nicholaus Rolland BRAVO, MD 01/13/24 [phone]

## 2024-01-13 NOTE — Progress Notes (Signed)
 Patient has been denied by [facility] due to no appropriate beds available. Patient meets [facility] inpatient criteria per Ines Hock, NP. Patient has been faxed out to the following facilities:   [facility]  [address], [place] [address] [phone]  [facility]  [address] [place], [place] [address] [phone]  [facility]  [address] [place], [place] [address] [phone]  [facility]  [address] [place] [address] [phone]  [facility]  [address] [place] [address] [phone]  [facility]  [address], [place] [address] [phone]  [facility]  [address] [place] [address] [phone]  [facility]  [address] [place] [phone]  [facility]  [address] [place] [address] [phone]  [facility]  [address] [place] [address] [phone]  [facility]  [address] [place] [address] [phone]  [facility]  [address], [place] [address] [phone]  [facility]  [address], [place] [address] [phone]  [facility]  [address] [place], [place] [address] [phone]  [facility]  [address], [place] [address] [phone]  [facility]  [address] [place] [address] [phone]  [facility]  [address] [place] [address] [phone]   Bunnie Gallop, MSW, LCSW-A  2:29 PM  01/13/2024

## 2024-01-13 NOTE — ED Notes (Signed)
Patient given sandwich tray. 

## 2024-01-13 NOTE — ED Notes (Signed)
 Lunch tray provided.

## 2024-01-13 NOTE — ED Notes (Signed)
 Pt endorsing SI with plans to use a knife to harm herself. Pt denies HI. Pt endorsing voices that are always there and sometimes will lessen with medications but not always. Pt contracted for safety during assessment and will come seek out staff if voices are too loud. Remains medication compliant.

## 2024-01-13 NOTE — ED Notes (Signed)
Patient is vol pending admit

## 2024-01-13 NOTE — ED Notes (Signed)
 Pt c/o anxiety. Orders received for prn medication and dose given. Will continue to monitor.

## 2024-01-13 NOTE — ED Notes (Signed)
VOL/Pending Inpt Admit  

## 2024-01-14 MED ORDER — LEVETIRACETAM 500 MG PO TABS
500.0000 mg | ORAL_TABLET | Freq: Two times a day (BID) | ORAL | Status: DC
  Administered 2024-01-14 – 2024-01-15 (×3): 500 mg via ORAL
  Filled 2024-01-14 (×3): qty 1

## 2024-01-14 NOTE — ED Notes (Signed)
This tech obtained vitals signs.  

## 2024-01-14 NOTE — ED Notes (Signed)
 Pt provided with lunch tray. Pt sitting up eating.

## 2024-01-14 NOTE — ED Notes (Signed)
 Breakfast has been given to pt.

## 2024-01-14 NOTE — ED Notes (Signed)
VOL/pending placement 

## 2024-01-14 NOTE — Progress Notes (Signed)
 Patient has been denied by [facility] due to no appropriate beds available. Patient has been faxed out to the following facilities:   [facility]  [address] [place] [address] [phone]  [facility]  [address] [place], [place] [address] [phone]  [facility]  [address] [place], [place] [address] [phone]  [facility]  [address] [place] [address] [phone]  [facility]  [address], [place] [address] [phone]  [facility]  [address] [place] [address] [phone]  [facility]  [address] [place] [address] [phone]  [facility]  [address] [place] [phone]  [facility]  [address] [place] [address] [phone]  [facility]  [address] [place] [address] [phone]  [facility]  [address] [place] [address] [phone]  [facility]  [address] [place] [address] [phone]  [facility]  [address], [place] [address] [phone]  [facility]  [address] [place], [place] [address] [phone]  [facility]  [address] [place] [address] [phone]  [facility]  [address] [place] [address] [phone]  [facility]  [address] [place] [address] [phone]   Bunnie Gallop, MSW, LCSW-A  11:10 AM 01/14/2024

## 2024-01-14 NOTE — ED Provider Notes (Signed)
 Emergency Medicine Observation Re-evaluation Note  Sherry Bryan is a 29 y.o. female, seen on rounds today.  Pt initially presented to the ED for complaints of Suicidal  Currently, the patient is no acute distress.   Physical Exam  Blood pressure (!) 119/97, pulse (!) 112, temperature (!) 97.5 F (36.4 C), temperature source Oral, resp. rate 18, SpO2 95%.  Physical Exam General: No apparent distress Pulm: Normal WOB Psych: resting     ED Course / MDM     I have reviewed the labs performed to date as well as medications administered while in observation.  Recent changes in the last 24 hours include  none   Plan   Current plan is to continue to wait for psych plan/placement if felt warranted  Patient is not under full IVC at this time.   Ernest Ronal BRAVO, MD 01/14/24 [phone]

## 2024-01-14 NOTE — ED Notes (Signed)
 Pt given snack and beverage.

## 2024-01-14 NOTE — ED Notes (Signed)
 Patient is vol pending placement

## 2024-01-15 ENCOUNTER — Emergency Department
Admission: EM | Admit: 2024-01-15 | Discharge: 2024-01-16 | Disposition: A | Discharge status: Home / Self Care | Payer: MEDICAID | Source: Home / Self Care | Attending: Emergency Medicine | Admitting: Emergency Medicine

## 2024-01-15 ENCOUNTER — Other Ambulatory Visit: Payer: Self-pay

## 2024-01-15 ENCOUNTER — Encounter: Payer: Self-pay | Admitting: Emergency Medicine

## 2024-01-15 DIAGNOSIS — F79 Unspecified intellectual disabilities: Secondary | ICD-10-CM

## 2024-01-15 DIAGNOSIS — F70 Mild intellectual disabilities: Secondary | ICD-10-CM | POA: Insufficient documentation

## 2024-01-15 DIAGNOSIS — R45851 Suicidal ideations: Secondary | ICD-10-CM | POA: Insufficient documentation

## 2024-01-15 DIAGNOSIS — F3481 Disruptive mood dysregulation disorder: Secondary | ICD-10-CM | POA: Insufficient documentation

## 2024-01-15 DIAGNOSIS — F17210 Nicotine dependence, cigarettes, uncomplicated: Secondary | ICD-10-CM | POA: Insufficient documentation

## 2024-01-15 LAB — URINE DRUG SCREEN
Amphetamines: NEGATIVE
Barbiturates: NEGATIVE
Benzodiazepines: NEGATIVE
Cocaine: NEGATIVE
Fentanyl: NEGATIVE
Methadone Scn, Ur: NEGATIVE
Opiates: NEGATIVE
Tetrahydrocannabinol: NEGATIVE

## 2024-01-15 LAB — CBC
HCT: 42.4 % (ref 36.0–46.0)
Hemoglobin: 13.3 g/dL (ref 12.0–15.0)
MCH: 27 pg (ref 26.0–34.0)
MCHC: 31.4 g/dL (ref 30.0–36.0)
MCV: 86.2 fL (ref 80.0–100.0)
Platelets: 213 K/uL (ref 150–400)
RBC: 4.92 MIL/uL (ref 3.87–5.11)
RDW: 21 % — ABNORMAL HIGH (ref 11.5–15.5)
WBC: 9.1 K/uL (ref 4.0–10.5)
nRBC: 0 % (ref 0.0–0.2)

## 2024-01-15 LAB — ETHANOL: Alcohol, Ethyl (B): <15 mg/dL

## 2024-01-15 LAB — COMPREHENSIVE METABOLIC PANEL WITH GFR
ALT: 24 U/L (ref 0–44)
AST: 19 U/L (ref 15–41)
Albumin: 3.7 g/dL (ref 3.5–5.0)
Alkaline Phosphatase: 72 U/L (ref 38–126)
Anion gap: 12 (ref 5–15)
BUN: 10 mg/dL (ref 6–20)
CO2: 21 mmol/L — ABNORMAL LOW (ref 22–32)
Calcium: 9.7 mg/dL (ref 8.9–10.3)
Chloride: 106 mmol/L (ref 98–111)
Creatinine, Ser: 0.96 mg/dL (ref 0.44–1.00)
GFR, Estimated: >60 mL/min
Glucose, Bld: 163 mg/dL — ABNORMAL HIGH (ref 70–99)
Potassium: 4.2 mmol/L (ref 3.5–5.1)
Sodium: 138 mmol/L (ref 135–145)
Total Bilirubin: 0.3 mg/dL (ref 0.0–1.2)
Total Protein: 7.3 g/dL (ref 6.5–8.1)

## 2024-01-15 MED ORDER — LEVETIRACETAM 500 MG PO TABS
500.0000 mg | ORAL_TABLET | Freq: Two times a day (BID) | ORAL | Status: DC
  Administered 2024-01-16 (×2): 500 mg via ORAL
  Filled 2024-01-15 (×4): qty 1

## 2024-01-15 MED ORDER — XANOMELINE-TROSPIUM CHLORIDE 125-30 MG PO CAPS: 125.0000 mg | ORAL_CAPSULE | Freq: Two times a day (BID) | ORAL | Status: DC

## 2024-01-15 MED ORDER — BENZTROPINE MESYLATE 1 MG PO TABS
1.0000 mg | ORAL_TABLET | Freq: Two times a day (BID) | ORAL | Status: DC
  Administered 2024-01-16 (×2): 1 mg via ORAL
  Filled 2024-01-15 (×2): qty 1

## 2024-01-15 MED ORDER — CLONIDINE HCL 0.1 MG PO TABS
0.1000 mg | ORAL_TABLET | Freq: Two times a day (BID) | ORAL | Status: DC
  Administered 2024-01-16 (×2): 0.1 mg via ORAL
  Filled 2024-01-15 (×2): qty 1

## 2024-01-15 MED ORDER — LORAZEPAM 0.5 MG PO TABS
0.5000 mg | ORAL_TABLET | Freq: Every day | ORAL | Status: DC
  Administered 2024-01-16: 0.5 mg via ORAL
  Filled 2024-01-15: qty 1

## 2024-01-15 MED ORDER — ALBUTEROL SULFATE HFA 108 (90 BASE) MCG/ACT IN AERS: 2.0000 | INHALATION_SPRAY | Freq: Four times a day (QID) | RESPIRATORY_TRACT | Status: DC | PRN

## 2024-01-15 MED ORDER — FOLIC ACID 1 MG PO TABS
1.0000 mg | ORAL_TABLET | Freq: Every day | ORAL | Status: DC
  Administered 2024-01-16: 1 mg via ORAL
  Filled 2024-01-15: qty 1

## 2024-01-15 MED ORDER — PANTOPRAZOLE SODIUM 40 MG PO TBEC
80.0000 mg | DELAYED_RELEASE_TABLET | Freq: Every day | ORAL | Status: DC
  Administered 2024-01-16: 80 mg via ORAL
  Filled 2024-01-15: qty 2

## 2024-01-15 MED ORDER — APIXABAN 5 MG PO TABS
5.0000 mg | ORAL_TABLET | Freq: Two times a day (BID) | ORAL | Status: DC
  Administered 2024-01-16 (×2): 5 mg via ORAL
  Filled 2024-01-15 (×2): qty 1

## 2024-01-15 MED ORDER — HYDROCHLOROTHIAZIDE 25 MG PO TABS
25.0000 mg | ORAL_TABLET | Freq: Every day | ORAL | Status: DC
  Administered 2024-01-16: 25 mg via ORAL
  Filled 2024-01-15: qty 1

## 2024-01-15 MED ORDER — DEUTETRABENAZINE ER 24 MG PO TB24: 1.0000 | ORAL_TABLET | Freq: Every day | ORAL | Status: DC

## 2024-01-15 MED ORDER — MIDODRINE HCL 5 MG PO TABS
5.0000 mg | ORAL_TABLET | Freq: Three times a day (TID) | ORAL | Status: DC
  Administered 2024-01-16 (×2): 5 mg via ORAL
  Filled 2024-01-15 (×4): qty 1

## 2024-01-15 MED ORDER — FERROUS SULFATE 325 (65 FE) MG PO TABS
325.0000 mg | ORAL_TABLET | ORAL | Status: DC
  Administered 2024-01-16: 325 mg via ORAL
  Filled 2024-01-15: qty 1

## 2024-01-15 MED ORDER — METOPROLOL TARTRATE 50 MG PO TABS
50.0000 mg | ORAL_TABLET | Freq: Every day | ORAL | Status: DC
  Administered 2024-01-16: 50 mg via ORAL
  Filled 2024-01-15: qty 1
  Filled 2024-01-15: qty 2

## 2024-01-15 MED ORDER — QUETIAPINE FUMARATE 25 MG PO TABS
50.0000 mg | ORAL_TABLET | Freq: Every day | ORAL | Status: DC
  Administered 2024-01-16: 50 mg via ORAL
  Filled 2024-01-15: qty 2

## 2024-01-15 MED ORDER — ACETAMINOPHEN 325 MG PO TABS
650.0000 mg | ORAL_TABLET | Freq: Four times a day (QID) | ORAL | Status: DC | PRN
  Administered 2024-01-15: 650 mg via ORAL
  Filled 2024-01-15: qty 2

## 2024-01-15 MED ORDER — TRAZODONE HCL 100 MG PO TABS
300.0000 mg | ORAL_TABLET | Freq: Every day | ORAL | Status: DC
  Administered 2024-01-16: 300 mg via ORAL
  Filled 2024-01-15: qty 3

## 2024-01-15 MED ORDER — LEVOTHYROXINE SODIUM 25 MCG PO TABS
50.0000 ug | ORAL_TABLET | Freq: Every day | ORAL | Status: DC
  Administered 2024-01-16: 50 ug via ORAL
  Filled 2024-01-15: qty 2

## 2024-01-15 MED ORDER — ACETAMINOPHEN 325 MG PO TABS: 650.0000 mg | ORAL_TABLET | Freq: Once | ORAL | Status: DC

## 2024-01-15 MED ORDER — ESCITALOPRAM OXALATE 10 MG PO TABS
10.0000 mg | ORAL_TABLET | Freq: Every day | ORAL | Status: DC
  Administered 2024-01-16: 10 mg via ORAL
  Filled 2024-01-15: qty 1

## 2024-01-15 MED ORDER — QUETIAPINE FUMARATE 25 MG PO TABS
12.5000 mg | ORAL_TABLET | Freq: Every day | ORAL | Status: DC
  Administered 2024-01-16: 12.5 mg via ORAL
  Filled 2024-01-15: qty 1

## 2024-01-15 MED ORDER — TRAZODONE HCL 50 MG PO TABS
75.0000 mg | ORAL_TABLET | Freq: Every day | ORAL | Status: DC
  Administered 2024-01-16: 75 mg via ORAL
  Filled 2024-01-15: qty 2

## 2024-01-15 MED ORDER — DIVALPROEX SODIUM 500 MG PO DR TAB
1000.0000 mg | DELAYED_RELEASE_TABLET | Freq: Two times a day (BID) | ORAL | Status: DC
  Administered 2024-01-16 (×2): 1000 mg via ORAL
  Filled 2024-01-15 (×2): qty 2

## 2024-01-15 NOTE — ED Notes (Signed)
 RN attempted to call   Sherry Bryan Caregiver Emergency Contact [phone]   To update on disposition. No answer, unable to leave VM.

## 2024-01-15 NOTE — ED Notes (Addendum)
 Pt moved to interview room to prepare for evaluation with psych team; security with pt to ensure pt safety
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
## 2024-01-15 NOTE — BH Assessment (Signed)
 Patient was deferred to IRIS for a telepsych assessment. The assigned care coordinator will provide updates regarding the scheduling of the assessment. IRIS coordinator can be reached at [phone] for further information on the timing of the telepsych evaluation.

## 2024-01-15 NOTE — ED Notes (Signed)
 Lunch tray provided to pt.

## 2024-01-15 NOTE — ED Notes (Signed)
 Pt moved to 19h to accommodate for a IVC pt needing to be closer to security

## 2024-01-15 NOTE — ED Notes (Signed)
 Pt moved to [facility] via wheelchair with ED tech and security. Report given to receiving RN.

## 2024-01-15 NOTE — Progress Notes (Signed)
 Per Peyton RN, [facility] spoke to Ileene Budge, Network Engineer of [facility] to inquire about pick up time for patient.  Ileene shared that her daughter would call us  back to confirm a time today.  [place], [facility] [phone]

## 2024-01-15 NOTE — BH Assessment (Signed)
 Psych consult was placed to be seen by IRIS providers.

## 2024-01-15 NOTE — Progress Notes (Addendum)
 [facility] followed up with Ileene (Owner of [facility]). Ileene confirmed that her staff will pick up patient by 3:30pm today.  Allyson, RN was updated.  [place], [facility] [phone]
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
## 2024-01-15 NOTE — ED Notes (Addendum)
 SABRA

## 2024-01-15 NOTE — ED Notes (Signed)
 Patient transported from quad to [facility] with no incidents. Patient shown to assigned room, and oriented to unit. [facility] rules and guidelines were reviewed with patient. Patient verbalized understanding, and a verbal safety contract was obtained by this RN.

## 2024-01-15 NOTE — ED Notes (Signed)
Snacks given to pt.

## 2024-01-15 NOTE — Progress Notes (Addendum)
 Per Zelda NP, contacted patient's legal guardian and spoke to Lorrin Rollings [phone]).  [facility] reported that patient has been psych cleared for discharge and can return to group home.  Lorrin verbalized understanding.      [place], [facility] [phone]

## 2024-01-15 NOTE — ED Provider Notes (Signed)
 Emergency Medicine Observation Re-evaluation Note  Sherry Bryan is a 29 y.o. female, seen on rounds today.  Pt initially presented to the ED for complaints of Suicidal  Currently, the patient is is no acute distress. Denies any concerns at this time.  Physical Exam  Blood pressure 125/84, pulse 98, temperature 98 F (36.7 C), temperature source Oral, resp. rate 18, SpO2 100%.  Physical Exam: General: No apparent distress Pulm: Normal WOB Neuro: Moving all extremities Psych: Resting comfortably     ED Course / MDM     I have reviewed the labs performed to date as well as medications administered while in observation.  Recent changes in the last 24 hours include: No acute events overnight.  Plan   Current plan: Patient evaluated by psychiatry.  Cleared for discharge.  Spoke with legal guardian who is okay with returning the patient to group home.  Patient we discharged back to group home. Patient is not under full IVC at this time.    Suzanne Kirsch, MD 01/15/24 1228

## 2024-01-15 NOTE — ED Notes (Signed)

## 2024-01-15 NOTE — ED Notes (Signed)
 Pt returned to stretcher from interview room

## 2024-01-15 NOTE — ED Notes (Addendum)
 Patient dressed out into hospital provided attire per this RN and EDT, Janeeta.  Belongings placed in labeled bag and handed off to [employer], [place].  Belongings include:  Floral Print Coat Tshrit Bra Jeggings White/Tan Nike Dunks Socks  Patient transported to [facility] at this time.

## 2024-01-15 NOTE — ED Notes (Signed)
 Pt taking shower. Pt was given hygiene items and the following, 1 clean top, 1 clean bottom, with 1 pair of disposable underwear.  Pt changed out into clean clothing.  Staff disposed of all shower supplies.

## 2024-01-15 NOTE — ED Provider Notes (Signed)
 Emergency Medicine Observation Re-evaluation Note  Sherry Bryan is a 29 y.o. female, seen on rounds today.  Pt initially presented to the ED for complaints of Suicidal  Currently, the patient is is no acute distress. Denies any concerns at this time.  Physical Exam  Blood pressure 124/89, pulse (!) 110, temperature 98.1 F (36.7 C), temperature source Oral, resp. rate 18, SpO2 96%.  Physical Exam: General: No apparent distress Pulm: Normal WOB Neuro: Moving all extremities Psych: Resting comfortably     ED Course / MDM     I have reviewed the labs performed to date as well as medications administered while in observation.  Recent changes in the last 24 hours include: No acute events overnight.  Plan   Current plan: Patient awaiting psychiatric disposition.  The patient has been placed in psychiatric observation due to the need to provide a safe environment for the patient while obtaining psychiatric consultation and evaluation, as well as ongoing medical and medication management to treat the patient's condition.      Sherry Bryan, Sherry SAILOR, DO 01/15/24 [phone]
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
## 2024-01-15 NOTE — ED Notes (Signed)
 RN attempted to call  [employer]oved [employer] Legal Guardian Emergency Contact [phone]    To update on disposition, no answer and unable to leave VM.

## 2024-01-15 NOTE — Consult Note (Signed)
 [facility] Consult Follow-up  Patient Name: .Sherry Bryan  MRN: [id number]  DOB: [birth year]  Consult Order details:  Orders (From admission, onward)     Start     Ordered   01/09/24 2127  CONSULT TO CALL ACT TEAM       Ordering Provider: Jacolyn Pae, MD  Provider:  (Not yet assigned)  Question:  Reason for Consult?  Answer:  Psych consult   01/09/24 2126   01/09/24 2127  IP CONSULT TO PSYCHIATRY       Ordering Provider: Jacolyn Pae, MD  Provider:  (Not yet assigned)  Question Answer Comment  Reason for consult: Other (see comments)   Comments: SI      01/09/24 2126             Mode of Visit: In person    Psychiatry Consult Evaluation  Service Date: January 15, 2024 LOS:  LOS: 0 days  Chief Complaint My group home is not listening to me  Primary Psychiatric Diagnoses   Intellectual disability   Adjustment disorder with mixed disturbance of emotions and conduct   Assessment   Patient was seen on rounds today by psychiatry. Patient reported doing all right at this time. Patient denied current suicidal or homicidal ideation. When asked about auditory and visual hallucinations, patient endorsed these symptoms, stating I can hear my mom's voice. This represents a chronic presentation for patient and is consistent with her baseline psychiatric symptoms.  Patient denied any noted side effects from her current medication regimen. Per nursing report, patient is currently alert and interacting appropriately on the unit. Patient reported sleeping and eating well at this time. Patient reported that she initially came to the emergency department because she feels like no one in her group home was listening to her about her thoughts and feelings, which represents a psychosocial stressor and communication concern rather than acute psychiatric decompensation.  Patient has a chronic psychiatric presentation with multiple prior emergency department  visits for similar complaints. Chart review reveals a pattern of presentations related to interpersonal conflicts at the group home or feelings of not being heard, rather than acute worsening of psychotic symptoms or dangerous behaviors. Patient has been holding in the emergency department for 5 days, and no inpatient psychiatric facilities have accepted patient during this time.  Given that this represents patient's chronic baseline presentation rather than acute psychiatric decompensation, and patient is residing in a structured group home environment, patient no longer meets criteria for inpatient psychiatric admission at this time. Inpatient psychiatric admission is typically indicated for acute safety concerns, acute exacerbation of psychiatric symptoms requiring intensive stabilization, medication initiation or significant changes requiring close monitoring, or inability to be safely managed in a less restrictive setting. Patient's current presentation does not meet these criteria, as she has maintained stability in the emergency department for 5 days without requiring intramuscular agitation medications or displaying dangerous behaviors, her psychotic symptoms are chronic and at baseline rather than acutely worsened, she is compliant with her current medication regimen, and she is eating, sleeping, and interacting appropriately with staff.  The group home provides an appropriate structured, monitored environment for patient's ongoing needs. Group homes are designed specifically to support individuals with chronic mental illness who require supervision and assistance with daily living but do not need the intensive, acute-level interventions provided on an inpatient psychiatric unit. The group home can provide 24-hour staffing, medication administration and monitoring, structured daily routines, assistance with activities of daily living, and ongoing support for  patient's chronic psychiatric  symptoms.  Patient has a documented legal guardian per chart review. Psychiatry has updated patient's legal guardian regarding current clinical status and discharge planning. Legal guardian is agreeable with the current plan to discharge patient back to the group home for continued care. Patient is psychiatrically cleared for discharge back to the group home at this time.  Diagnoses:  Active Hospital problems: Active Problems:   Intellectual disability   Adjustment disorder with mixed disturbance of emotions and conduct    Plan   ## Psychiatric Medication Recommendations:  None at this time please continue current home medication regimen  ## Medical Decision Making Capacity: Patient has a guardian and has thus been adjudicated incompetent; please involve patients guardian in medical decision making    ## Disposition:--Patient is psychiatrically cleared for discharge back to group home at this time  ## Behavioral / Environmental: -Utilize compassion and acknowledge the patient's experiences while setting clear and realistic expectations for care.    ## Safety and Observation Level:  - Based on my clinical evaluation, I estimate the patient to be at low risk of self harm in the current setting.  Patient noted to be at chronically elevated CSS R risk category due to her history and chronic presentations. - At this time, we recommend  routine. This decision is based on my review of the chart including patient's history and current presentation, interview of the patient, mental status examination, and consideration of suicide risk including evaluating suicidal ideation, plan, intent, suicidal or self-harm behaviors, risk factors, and protective factors. This judgment is based on our ability to directly address suicide risk, implement suicide prevention strategies, and develop a safety plan while the patient is in the clinical setting. Please contact our team if there is a concern that risk  level has changed.  CSSR Risk Category:C-SSRS RISK CATEGORY: Moderate Risk  Suicide Risk Assessment: Patient has following modifiable risk factors for suicide: triggering events, which we are addressing by utilizing therapeutic communication to provide patient safe space to discuss ongoing concerns. Patient has following non-modifiable or demographic risk factors for suicide: psychiatric hospitalization Patient has the following protective factors against suicide: Access to outpatient mental health care group home provide structured monitored environment for continuous monitoring  Thank you for this consult request. Recommendations have been communicated to the primary team.  We will sign off at this time.   Zelda Sharps, NP        History of Present Illness  Relevant Aspects of Hospital ED   Collected by initial provider that saw patient via telehealth please reference IRIS telehealth initial note    Psychiatric and Social History  Psychiatric History:  Collected by initial provider that saw patient via telehealth please reference IRIS telehealth initial note  Exam Findings  Physical Exam: Reviewed and agree with the physical exam findings conducted by the medical provider Vital Signs:  Temp:  [98 F (36.7 C)-98.6 F (37 C)] 98 F (36.7 C) (01/05 0807) Pulse Rate:  [98-118] 98 (01/05 1135) Resp:  [14-18] 18 (01/05 1135) BP: (123-125)/(72-89) 125/84 (01/05 0807) SpO2:  [92 %-100 %] 100 % (01/05 1135) Blood pressure 125/84, pulse 98, temperature 98 F (36.7 C), temperature source Oral, resp. rate 18, SpO2 100%. There is no height or weight on file to calculate BMI.    Mental Status Exam: General Appearance: Casual  Orientation:  Full (Time, Place, and Person)  Memory:  Fair  Concentration:  Concentration: Fair  Recall:  Fair  Attention  Fair  Eye Contact:  Fair  Speech:  Slow  Language:  Fair  Volume:  Normal  Mood: Euthymic  Affect:  Congruent  Thought Process:   Coherent, Goal Directed, and Linear  Thought Content:  Logical  Suicidal Thoughts:  No  Homicidal Thoughts:  No  Judgement:  Poor  Insight:  Lacking  Psychomotor Activity:  Normal  Akathisia:  No  Fund of Knowledge:  Poor      Assets:  [employer] Support Transportation  Cognition:  Impaired at baseline  ADL's:  Intact  AIMS (if indicated):        Other History   These have been pulled in through the EMR, reviewed, and updated if appropriate.  Family History:  The patient's family history is not on file.  Medical History: Past Medical History:  Diagnosis Date   Borderline personality disorder (HCC)    Constipation 05/15/2015   Depression    DMDD (disruptive mood dysregulation disorder) 05/15/2015   History of seasonal allergies 05/15/2015   Hx of gastroesophageal reflux (GERD) 05/15/2015   Hx of seizure disorder 05/15/2015   Intellectual disability 05/22/2015   PTSD (post-traumatic stress disorder)    Seizures (HCC)    last one in 2015   Suicidal ideation     Surgical History: History reviewed. No pertinent surgical history.   Medications:  Current Medications[1]  Allergies: Allergies[2]  Zelda Sharps, NP This note was created using Dragon dictation software. Please excuse any inadvertent transcription errors. Case was discussed with supervising physician Dr. Jadapalle who is agreeable with current plan.       [1]  Current Facility-Administered Medications:    albuterol  (PROVENTIL ) (2.5 MG/3ML) 0.083% nebulizer solution 3 mL, 3 mL, Inhalation, Q6H PRN, Quale, Mark, MD   apixaban  (ELIQUIS ) tablet 5 mg, 5 mg, Oral, BID, Elesa Perkins, RPH, 5 mg at 01/15/24 0940   escitalopram  (LEXAPRO ) tablet 10 mg, 10 mg, Oral, Daily, Quale, Mark, MD, 10 mg at 01/15/24 0940   hydrOXYzine  (ATARAX ) tablet 50 mg, 50 mg, Oral, Q6H PRN, Madaram, Kondal R, MD, 50 mg at 01/14/24 1939   levETIRAcetam  (KEPPRA ) tablet 500 mg, 500 mg,  Oral, BID, Quale, Mark, MD, 500 mg at 01/15/24 0940   levothyroxine  (SYNTHROID ) tablet 50 mcg, 50 mcg, Oral, QAC breakfast, Quale, Mark, MD, 50 mcg at 01/15/24 0940   QUEtiapine  (SEROQUEL ) tablet 12.5 mg, 12.5 mg, Oral, QHS, Quale, Oneil, MD, 12.5 mg at 01/14/24 2122   traZODone  (DESYREL ) tablet 75 mg, 75 mg, Oral, QHS, Quale, Oneil, MD, 75 mg at 01/14/24 2123  Current Outpatient Medications:    apixaban  (ELIQUIS ) 5 MG TABS tablet, Take 5 mg by mouth 2 (two) times daily., Disp: , Rfl:    benztropine  (COGENTIN ) 1 MG tablet, Take 1 mg by mouth 2 (two) times daily., Disp: , Rfl:    cloNIDine  (CATAPRES ) 0.1 MG tablet, Take 0.1 mg by mouth 2 (two) times daily., Disp: , Rfl:    Deutetrabenazine  ER (AUSTEDO  XR) 24 MG TB24, Take 1 tablet by mouth at bedtime., Disp: , Rfl:    divalproex  (DEPAKOTE ) 500 MG DR tablet, Take 1,000 mg by mouth 2 (two) times daily., Disp: , Rfl:    docusate sodium  (COLACE) 100 MG capsule, Take 100 mg by mouth 2 (two) times daily., Disp: , Rfl:    escitalopram  (LEXAPRO ) 10 MG tablet, Take 10 mg by mouth daily., Disp: , Rfl:    ferrous sulfate  325 (65 FE) MG tablet, Take 1 tablet (325 mg total) by mouth  every other day., Disp: 30 tablet, Rfl: 0   fluticasone  (FLONASE ) 50 MCG/ACT nasal spray, Place 1 spray into both nostrils daily., Disp: , Rfl:    folic acid  (FOLVITE ) 1 MG tablet, Take 1 mg by mouth daily., Disp: , Rfl:    hydrochlorothiazide  (HYDRODIURIL ) 25 MG tablet, Take 25 mg by mouth daily., Disp: , Rfl:    levETIRAcetam  (KEPPRA ) 500 MG tablet, Take 500 mg by mouth 2 (two) times daily., Disp: , Rfl:    levothyroxine  (SYNTHROID ) 50 MCG tablet, Take 50 mcg by mouth daily before breakfast., Disp: , Rfl:    loperamide  (IMODIUM  A-D) 2 MG tablet, Take 1 tablet (2 mg total) by mouth 4 (four) times daily as needed for diarrhea or loose stools., Disp: 30 tablet, Rfl: 0   LORazepam  (ATIVAN ) 1 MG tablet, Take 0.5 tablets (0.5 mg total) by mouth at bedtime. (Patient taking differently:  Take 1 mg by mouth in the morning, at noon, and at bedtime.), Disp: , Rfl:    metoprolol  tartrate (LOPRESSOR ) 50 MG tablet, Take 50 mg by mouth daily., Disp: , Rfl:    midodrine  (PROAMATINE ) 5 MG tablet, Take 1 tablet (5 mg total) by mouth 3 (three) times daily with meals., Disp: 90 tablet, Rfl: 0   Multiple Vitamins-Minerals (MULTIVITAMIN ADULTS) TABS, Take 1 tablet by mouth daily., Disp: , Rfl:    omeprazole (PRILOSEC) 40 MG capsule, Take 40 mg by mouth daily., Disp: , Rfl:    QUEtiapine  (SEROQUEL ) 25 MG tablet, Take 0.5 tablets (12.5 mg total) by mouth at bedtime., Disp: 30 tablet, Rfl: 0   QUEtiapine  (SEROQUEL ) 50 MG tablet, Take 50 mg by mouth daily., Disp: , Rfl:    trazodone  (DESYREL ) 50 MG tablet, Take 1.5 tablets (75 mg total) by mouth at bedtime., Disp: 30 tablet, Rfl: 0   UZEDY  100 MG/0.28ML syringe, Inject 100 mg into the skin every 30 (thirty) days., Disp: , Rfl:    VENTOLIN  HFA 108 (90 Base) MCG/ACT inhaler, Inhale 2 puffs into the lungs every 6 (six) hours as needed for wheezing or shortness of breath., Disp: , Rfl:    Xanomeline-Trospium  Chloride 125-30 MG CAPS, Take 1 capsule by mouth 2 (two) times daily., Disp: , Rfl:    meclizine  (ANTIVERT ) 25 MG tablet, Take 25 mg by mouth 3 (three) times daily as needed for nausea., Disp: , Rfl:    trazodone  (DESYREL ) 300 MG tablet, Take 300 mg by mouth at bedtime. (Patient not taking: Reported on 01/10/2024), Disp: , Rfl:  [2]  Allergies Allergen Reactions   Ritalin [Methylphenidate Hcl] Other (See Comments)    seizures   Abilify [Aripiprazole] Other (See Comments)    Shaking or tremors   Lithium 

## 2024-01-16 ENCOUNTER — Other Ambulatory Visit: Payer: Self-pay

## 2024-01-16 ENCOUNTER — Emergency Department
Admission: EM | Admit: 2024-01-16 | Discharge: 2024-01-17 | Disposition: A | Discharge status: Home / Self Care | Payer: MEDICAID | Attending: Emergency Medicine | Admitting: Emergency Medicine

## 2024-01-16 DIAGNOSIS — R45851 Suicidal ideations: Secondary | ICD-10-CM | POA: Diagnosis not present

## 2024-01-16 DIAGNOSIS — F79 Unspecified intellectual disabilities: Secondary | ICD-10-CM | POA: Diagnosis not present

## 2024-01-16 DIAGNOSIS — R4689 Other symptoms and signs involving appearance and behavior: Secondary | ICD-10-CM | POA: Diagnosis present

## 2024-01-16 LAB — PREGNANCY, URINE: Preg Test, Ur: NEGATIVE

## 2024-01-16 LAB — VALPROIC ACID LEVEL: Valproic Acid Lvl: <10 ug/mL — ABNORMAL LOW (ref 50–100)

## 2024-01-16 MED ORDER — QUETIAPINE FUMARATE 25 MG PO TABS
50.0000 mg | ORAL_TABLET | Freq: Every day | ORAL | Status: DC
  Administered 2024-01-17: 50 mg via ORAL
  Filled 2024-01-16: qty 2

## 2024-01-16 MED ORDER — LORAZEPAM 0.5 MG PO TABS: 0.5000 mg | ORAL_TABLET | Freq: Every day | ORAL | Status: DC

## 2024-01-16 MED ORDER — APIXABAN 5 MG PO TABS
5.0000 mg | ORAL_TABLET | Freq: Two times a day (BID) | ORAL | Status: DC
  Administered 2024-01-16 – 2024-01-17 (×2): 5 mg via ORAL
  Filled 2024-01-16 (×2): qty 1

## 2024-01-16 MED ORDER — PANTOPRAZOLE SODIUM 40 MG PO TBEC
40.0000 mg | DELAYED_RELEASE_TABLET | Freq: Every day | ORAL | Status: DC
  Administered 2024-01-17: 40 mg via ORAL
  Filled 2024-01-16: qty 1

## 2024-01-16 MED ORDER — LEVOTHYROXINE SODIUM 25 MCG PO TABS
50.0000 ug | ORAL_TABLET | Freq: Every day | ORAL | Status: DC
  Administered 2024-01-17: 50 ug via ORAL
  Filled 2024-01-16: qty 2

## 2024-01-16 MED ORDER — DEUTETRABENAZINE ER 24 MG PO TB24: 1.0000 | ORAL_TABLET | Freq: Every day | ORAL | Status: DC

## 2024-01-16 MED ORDER — DIVALPROEX SODIUM 500 MG PO DR TAB: 500.0000 mg | DELAYED_RELEASE_TABLET | Freq: Two times a day (BID) | ORAL | 0 refills | Status: AC

## 2024-01-16 MED ORDER — ESCITALOPRAM OXALATE 10 MG PO TABS
10.0000 mg | ORAL_TABLET | Freq: Every day | ORAL | Status: DC
  Administered 2024-01-17: 10 mg via ORAL
  Filled 2024-01-16: qty 1

## 2024-01-16 MED ORDER — DOCUSATE SODIUM 100 MG PO CAPS
100.0000 mg | ORAL_CAPSULE | Freq: Two times a day (BID) | ORAL | Status: DC
  Administered 2024-01-16 – 2024-01-17 (×2): 100 mg via ORAL
  Filled 2024-01-16 (×2): qty 1

## 2024-01-16 MED ORDER — QUETIAPINE FUMARATE 25 MG PO TABS: 12.5000 mg | ORAL_TABLET | Freq: Every day | ORAL | Status: DC

## 2024-01-16 MED ORDER — METOPROLOL TARTRATE 50 MG PO TABS
50.0000 mg | ORAL_TABLET | Freq: Every day | ORAL | Status: DC
  Administered 2024-01-17: 50 mg via ORAL
  Filled 2024-01-16: qty 1

## 2024-01-16 MED ORDER — MIDODRINE HCL 5 MG PO TABS
5.0000 mg | ORAL_TABLET | Freq: Three times a day (TID) | ORAL | Status: DC
  Administered 2024-01-17: 5 mg via ORAL
  Filled 2024-01-16 (×3): qty 1

## 2024-01-16 NOTE — ED Triage Notes (Signed)
 Pt BIB [facility] from [facility] off [address] EMS initially called out for SOB, EMS reports pt O2 at 98% RA. Pt had walked to a nearby facility to call EMS. After vitals taken with EMS pt then stated she was having thoughts of self harm. Pt Dc'd to [facility] about an hour ago. EMS reports pt does not like the GH.  Vitals: 127/63, 96%RA

## 2024-01-16 NOTE — ED Provider Notes (Signed)
----------------------------------------- °  9:53 AM on 01/16/2024 ----------------------------------------- Patient has been seen and examined by psychiatry.  They believe patient is safe for discharge home with addition of Depakote  to the patient's home medication regimen.  Patient has been started on Depakote  in the emergency department yesterday.  Will continue Depakote  500 mg twice daily going home I will prescribe the same for the patient.  Will contact the legal guardian to arrange for disposition and discharge.  Medical workup is been largely nonrevealing   Dorothyann Drivers, MD 01/16/24 [phone]

## 2024-01-16 NOTE — ED Notes (Signed)
 Pt given snack and beverage.

## 2024-01-16 NOTE — Discharge Instructions (Addendum)
You have been seen in the emergency department for a  psychiatric concern. You have been evaluated both medically as well as psychiatrically. Please follow-up with your outpatient resources provided. Return to the emergency department for any worsening symptoms, or any thoughts of hurting yourself or anyone else so that we may attempt to help you. 

## 2024-01-16 NOTE — ED Notes (Signed)
 Patient sleeping

## 2024-01-16 NOTE — ED Notes (Addendum)
 This RN called the [facility]'s number that is on the chart and the woman who answered told me to call her daughter, Sharene, who manages the pt's case. Latoya told me that her and the pt are going to have to have a conversation because the pt keeps being aggressive towards staff and goes in and out of the group home/hospital. Latoya talked to the pt for a few minutes. Latoya stated that the pt will have to talk to her and the staff about pt's behavior and if it kept happening that she would have to get in touch with pt's legal guardians about alternative placement. Raquel RN made aware. Raquel stated that she would call Latoya.

## 2024-01-16 NOTE — ED Provider Notes (Signed)
 "  [facility] Provider Note    Event Date/Time   First MD Initiated Contact with Patient 01/16/24 1649     (approximate)   History   Suicidal  HPI  Lenix Benoist is a 29 y.o. female with a history of intellectual disability. DMDD, borderline personality and PTSD.     Patient reports that she got back to her group home did not want to be there so instead of walking in she walked down the street and called 911 from a phone of the local business.  She reports she has been feeling suicidal for at least 3 weeks.  She did not take any medications or do anything since she left.  She just got to the group home walk down the street and called 911.  She denies any concerns at this time other than she does not like her group home reporting that staff there aggressive towards her at times.  She does not have a plan to commit suicide but she reports suicidal ideation.  She also reports her guardian is in [facility]    Reviewed external records from psychiatry, provider from today 'The patient is a 29 year old female with a history of intellectual disability. DMDD, borderline personality and PTSD.'   .  The patient was discharged just a couple hours ago back to her group home  Past Medical History:  Diagnosis Date   Borderline personality disorder (HCC)    Constipation 05/15/2015   Depression    DMDD (disruptive mood dysregulation disorder) 05/15/2015   History of seasonal allergies 05/15/2015   Hx of gastroesophageal reflux (GERD) 05/15/2015   Hx of seizure disorder 05/15/2015   Intellectual disability 05/22/2015   PTSD (post-traumatic stress disorder)    Seizures (HCC)    last one in 2015   Suicidal ideation      Physical Exam   Triage Vital Signs: ED Triage Vitals  Encounter Vitals Group     BP 01/16/24 1702 98/60     Girls Systolic BP Percentile --      Girls Diastolic BP Percentile --      Boys Systolic BP Percentile --      Boys Diastolic  BP Percentile --      Pulse Rate 01/16/24 1702 (!) 58     Resp 01/16/24 1702 16     Temp 01/16/24 1702 98.4 F (36.9 C)     Temp Source 01/16/24 1702 Oral     SpO2 01/16/24 1702 96 %     Weight 01/16/24 1630 297 lb 9.9 oz (135 kg)     Height 01/16/24 1630 5' 4 (1.626 m)     Head Circumference --      Peak Flow --      Pain Score 01/16/24 1630 0     Pain Loc --      Pain Education --      Exclude from Growth Chart --     Most recent vital signs: Vitals:   01/17/24 1027 01/17/24 1626  BP: 122/75 119/87  Pulse: (!) 112 94  Resp: 18 18  Temp: 98.5 F (36.9 C) 98.5 F (36.9 C)  SpO2: 98% 100%     General: Awake, no distress.  Resting comfortably without distress pleasant and fully oriented no psychomotor agitation CV:   Good peripheral perfusion.  Resp:   Normal effort.  Speaking without distress. Abd:   Neuro:  Resting comfortably fully alert.  Speech is clear.  Moves extremities well without  noted concern. Other:  She reports suicidal ideation.  Appears to be fairly passive she does not have a plan.  As we discussed further it seems that her primary concern is not suicidal ideation is much as the situation that she does not enjoy the group home that she is at   ED Results / Procedures / Treatments   Labs (all labs ordered are listed, but only abnormal results are displayed) Labs Reviewed - No data to display  Reviewed labs drawn yesterday negative pregnancy test.  Low valproic acid  level.  Negative ethanol.  Comprehensive metabolic panel and CBC essentially unremarkable.    PROCEDURES:  Critical Care performed: No  Procedures   MEDICATIONS ORDERED IN ED: Medications - No data to display   IMPRESSION / MDM / ASSESSMENT AND PLAN / ED COURSE  I reviewed the triage vital signs and the nursing notes.                              Based on presentation, the differential diagnosis includes, but is not limited to key considerations:  Patient appears to have  ongoing concerns regarding her group home.  She denies any suicidal attempt.  She reports she got to group home did not ever go and walked down the street called 911 to come back to the ER as she does not like the group home.  She is awake alert well-oriented no distress.  She was seen by psychiatry just recently here.  I will leave her voluntary at this time she is quite motivated not to go back to her group home she also has a guardian, and at this point I think she would benefit by reevaluation with TTS and psychiatry for appropriate disposition as my concern is that we if we immediately discharged her back to her group home she would likely do the exact same thing and come back for same concerns as it seems the group home is her primary concern at this point.  Patient's presentation is most consistent with acute complicated illness / injury requiring diagnostic workup.  ----------------------------------------- 5:15 PM on 01/16/2024 ----------------------------------------- Will continue patient on home medications.  Pending psychiatry and TTS consults.  The patient has been placed in psychiatric observation due to the need to provide a safe environment for the patient while obtaining psychiatric consultation and evaluation, as well as ongoing medical and medication management to treat the patient's condition.  The patient has not been placed under full IVC at this time.        FINAL CLINICAL IMPRESSION(S) / ED DIAGNOSES   Final diagnoses:  Suicidal ideation     Rx / DC Orders   ED Discharge Orders     None        Note:  This document was prepared using Dragon voice recognition software and may include unintentional dictation errors.   Dicky Anes, MD 01/23/24 1452  "

## 2024-01-16 NOTE — ED Notes (Signed)
Patient in assigned room.

## 2024-01-16 NOTE — ED Notes (Signed)
 Pt stating she is having thoughts of SI w/ no plan, states been thinking about it for months. Pt endorses stressors at [facility], being mistreated and taken to the floor for no reason. After conversation with pt, pt asking for dinner tray. Informed would need to wait for EDP to see then we could see about food.

## 2024-01-16 NOTE — ED Notes (Signed)
 VOL/  PENDING  CONSULT

## 2024-01-16 NOTE — ED Notes (Signed)
 At this time, this EDT changed this pt out into purple paper scrubs. Pt was calm and cooperative. The following list is pt personal belonging:  - pink bra  - multicolor leggings  - black shirt - white socks  - lip balm  - a ball (toy)  - elastic hair tie   Pt is now dressed out and placed in 19H. Pt has no requests at this time.

## 2024-01-16 NOTE — ED Notes (Signed)
Patient in day room. 

## 2024-01-16 NOTE — ED Notes (Signed)
 VOL/ @ 2311 Moved from [facility] to [facility]

## 2024-01-16 NOTE — ED Notes (Signed)
 Meal provided

## 2024-01-16 NOTE — ED Notes (Signed)
 Richelle Woody, pt's legal guardian, returned call from last night. This RN informed her that pt was here and updated about pt's plan of care.

## 2024-01-16 NOTE — ED Notes (Signed)
 vol/consult done/pending disposition.

## 2024-01-16 NOTE — ED Notes (Signed)
 Pt belongings transferred to the [facility] with pt

## 2024-01-16 NOTE — ED Notes (Signed)
 This RN attempted to call [name], pt's legal guardian, to inform her of pt's d/c plan but no answer. Will attempt to call again later.

## 2024-01-16 NOTE — ED Notes (Signed)
 Per pt request, pt given phone to call mother.

## 2024-01-17 ENCOUNTER — Other Ambulatory Visit: Payer: Self-pay

## 2024-01-17 ENCOUNTER — Encounter: Payer: Self-pay | Admitting: *Deleted

## 2024-01-17 ENCOUNTER — Emergency Department
Admission: EM | Admit: 2024-01-17 | Discharge: 2024-01-18 | Payer: MEDICAID | Attending: Emergency Medicine | Admitting: Emergency Medicine

## 2024-01-17 DIAGNOSIS — Z59819 Housing instability, housed unspecified: Secondary | ICD-10-CM | POA: Insufficient documentation

## 2024-01-17 DIAGNOSIS — Z5989 Other problems related to housing and economic circumstances: Secondary | ICD-10-CM

## 2024-01-17 NOTE — ED Provider Notes (Signed)
@  [facility]@  Blood pressure 98/60, pulse (!) 58, temperature 98.4 F (36.9 C), temperature source Oral, resp. rate 16, height 5' 4 (1.626 m), weight 135 kg, SpO2 96%.  The patient is calm and cooperative at this time.  There have been no acute events since the last update.  Awaiting disposition plan from Behavioral Medicine team.    Dionisia Pacholski M, MD 01/17/24 [phone]

## 2024-01-17 NOTE — ED Notes (Signed)
 Attempted to Call pt's legal guardian Ethelene Hoover [phone], unsuccessful attempt left a HIPAA compliant VM at this time

## 2024-01-17 NOTE — BH Assessment (Signed)
 Clinical Research Associate received call from Sherry Bryan, group land. She reports she is coming to pick up patient in the next 20 min. Sherry Bryan shared her concerns with patient constantly coming to the ED. She states she will reach out to patient's legal guardian to find other housing alternatives and to discuss the seriousness of patient's behavior with the ED.

## 2024-01-17 NOTE — ED Triage Notes (Signed)
 First Nurse Note:  Pt via [facility] from home. Pt ran away from group home, states that she refused inside the group home, pt was discharged earlier today and was psych cleared.

## 2024-01-17 NOTE — Discharge Instructions (Addendum)
 Follow-up with your outpatient psychiatry regarding continue taking your medications as prescribed.  Return to the ER immediately for new or worsening symptoms including new or worsening suicidal ideations, thoughts of harm to others, new physical symptoms, or any other new or worsening symptoms that concern you.

## 2024-01-17 NOTE — ED Notes (Signed)
 Patient sleeping

## 2024-01-17 NOTE — ED Notes (Signed)
 Pt offered supplies to shower at this time. Pt states she has yeast under her breasts and requesting cream. Provider notified.

## 2024-01-17 NOTE — ED Notes (Signed)
 Pt safe for discharge at this time. Discharge instructions reviewed and this RN reiterated importance of not returning to ED due to not liking group home as legal charges will be taken. Group home staff present for conversation and supportive of plan. Pt verbalized understanding. Pt ambulatory out of department with steady gait dressed in appropriate clothing to be driven home with group home staff.

## 2024-01-17 NOTE — Consult Note (Signed)
 [facility] Psychiatric Consult Initial  Patient Name: .Sherry Bryan  MRN: [id number]  DOB: 27-May-1995  Consult Order details:  Orders (From admission, onward)     Start     Ordered   01/16/24 1707  CONSULT TO CALL ACT TEAM       Ordering Provider: Dicky Anes, MD  Provider:  (Not yet assigned)  Question:  Reason for Consult?  Answer:  Psych consult   01/16/24 1706   01/16/24 1707  IP CONSULT TO PSYCHIATRY       Ordering Provider: Dicky Anes, MD  Provider:  (Not yet assigned)  Question:  Reason for consult:  Answer:  Medication management   01/16/24 1706             Mode of Visit: In person    Psychiatry Consult Evaluation  Service Date: January 17, 2024 LOS:  LOS: 0 days  Chief Complaint I don't like the group home  Primary Psychiatric Diagnoses    Intellectual disability Recurrent presentation for endorsed suicidal ideations   Assessment    Patient is not appropriate for inpatient psychiatric admission at this time based on the following clinical factors:  Absence of acute psychiatric crisis: Patient denies current suicidal or homicidal ideation during this assessment. Her psychiatric symptoms, including auditory hallucinations of her mother's voice, are chronic and at baseline with no acute worsening or change from her previous evaluation just hours ago. There is no evidence of acute mood episode, psychotic decompensation, or dangerous behaviors that would warrant inpatient psychiatric hospitalization.  Pattern of emergency department utilization for non-psychiatric reasons: Patient's repeated emergency department presentations within a 24-hour period, with discharge followed by return 2 hours later with the same complaints, suggests she is using emergency services to express dissatisfaction with her living situation rather than for genuine psychiatric emergencies. This pattern of behavior does not meet criteria for inpatient psychiatric admission, which is  reserved for patients who cannot be safely managed in less restrictive settings due to acute psychiatric illness or imminent danger to self or others.  Housing dissatisfaction is not a psychiatric emergency: Patient's stated reason for suicidal thoughts is that she does not like her group home. While housing dissatisfaction can be a legitimate stressor, it does not in itself constitute a psychiatric emergency requiring inpatient hospitalization. Patient's concerns about her living arrangement are more appropriately addressed through her legal guardian, group home staff, social services, and outpatient mental health providers rather than through repeated inpatient psychiatric admissions.  Inpatient psychiatric admission would not address the underlying issue: Admitting patient to an inpatient psychiatric unit would not resolve her dissatisfaction with the group home and may reinforce maladaptive pattern of using emergency services to avoid her living situation. Inpatient psychiatric treatment is designed to stabilize acute psychiatric symptoms, adjust medications, provide crisis intervention, and ensure safety during periods of acute risk. None of these interventions are clinically indicated at this time, as patient is psychiatrically stable at her baseline and is not in acute crisis.  Appropriate supports are in place: Patient has a documented legal guardian who is involved in her care and can advocate for her needs, including addressing concerns about group home placement through appropriate channels. Patient resides in a structured group home setting that provides supervision, support with activities of daily living, and medication management. Patient has access to outpatient mental health services to address ongoing psychiatric needs.  Risk of enabling maladaptive coping pattern: Admitting patient to inpatient psychiatric unit every time she expresses dissatisfaction with her living situation  would  reinforce the pattern of using emergency services inappropriately and would not teach effective problem-solving skills or healthy ways to address concerns about her living environment.  Education was provided to patient regarding appropriate use of emergency department services. Patient was informed that the emergency department is for acute psychiatric emergencies, not for addressing dissatisfaction with housing placements. Patient was encouraged to work with her legal guardian, group home staff, and outpatient treatment providers to address concerns about her living situation through appropriate channels.  Transition to [facility] ([facility]) contacted patient's legal guardian regarding current presentation and disposition status. Legal guardian was informed of psychiatry's assessment and recommendation that inpatient psychiatric admission is not clinically indicated at this time. Legal guardian agreed with current treatment plan and disposition. Legal guardian will be involved in addressing patient's concerns about group home placement and exploring whether adjustments can be made to improve patient's satisfaction or whether alternative placement options should be considered through appropriate social services channels.  Patient is psychiatrically cleared for discharge back to group home with plan to follow up with established outpatient mental health services. Strict return precautions were discussed with patient and legal guardian.   Diagnoses:  Active Hospital problems: Active Problems:   Intellectual disability    Plan   ## Psychiatric Medication Recommendations:  Continue home medications at this time  ## Medical Decision Making Capacity: Patient has a guardian and has thus been adjudicated incompetent; please involve patients guardian in medical decision making     ## Disposition:-- There are no psychiatric contraindications to discharge at this time  ## Behavioral /  Environmental: - No specific recommendations at this time.     ## Safety and Observation Level:  - Based on my clinical evaluation, I estimate the patient to be at low risk of self harm in the current setting. - At this time, we recommend  routine. This decision is based on my review of the chart including patient's history and current presentation, interview of the patient, mental status examination, and consideration of suicide risk including evaluating suicidal ideation, plan, intent, suicidal or self-harm behaviors, risk factors, and protective factors. This judgment is based on our ability to directly address suicide risk, implement suicide prevention strategies, and develop a safety plan while the patient is in the clinical setting. Please contact our team if there is a concern that risk level has changed.     Thank you for this consult request. Recommendations have been communicated to the primary team.  We will sign off at this time.   Zelda Sharps, NP        History of Present Illness  Relevant Aspects of Hospital ED   Patient Report:  Patient was seen on rounds today by psychiatry. Of note, this is patient's second psychiatric evaluation within 24 hours. Patient was recently discharged from this emergency department and returned approximately 2 hours later with similar complaints. Patient reported endorsing suicidal thoughts, attributing these to not liking the group home where she resides.  Patient currently denies suicidal or homicidal ideation. When asked about auditory or visual hallucinations, patient endorsed her chronic baseline presentation of hearing her mother's voice, which has been documented in previous psychiatric evaluations. There does not appear to be any acute changes in patient's psychiatric status within the last 24 hours that would necessitate a different treatment approach or disposition than was determined during her evaluation  yesterday.  Chart review reveals  patient has a documented legal guardian. Review of recent emergency department utilization patterns  shows repeated presentations when patient expresses dissatisfaction with her group home placement, suggesting she may be using emergency department services as a means of avoiding or leaving the group home rather than for acute psychiatric crises.  Psychiatry team had an extensive discussion with patient regarding appropriate use of emergency room services and explained that the emergency department is designed for acute psychiatric emergencies requiring immediate intervention, not for dissatisfaction with living arrangements or desire for alternative placement. When this was discussed with patient, she stated but I do not like my group home, which further supports that her presentation is driven by housing dissatisfaction rather than acute psychiatric decompensation.      Psychiatric and Social History  Psychiatric History:  Information collected from Patient/chart review   Prev Dx/Sx: Bipolar disorder, Major depressive disorder, and Generalized anxiety disorder , IDD Current Psych Provider: [facility]  Home Meds (current): patient unsure Previous Med Trials: Invega, Haldol, Zyprexa , Abilify, Vraylar, Rexulti per patient report  Therapy: Unknown   Prior Psych Hospitalization: Yes  Prior Self Harm: Yes Prior Violence: Yes   Family Psych History: Patient reports family history of bipolar disorder, ADHD, ODD, and substance abuse in her mother  Family Hx suicide: unknown     Educational Hx: 11th grade Occupational Hx: Unemployed, receives disability Legal Hx: Unknown Living Situation: Group Home Access to weapons/lethal means: Denies   Substance History Alcohol: Denies Tobacco: Patient reports smoking 2 cigarettes/day and vaping Illicit drugs: Denies Prescription drug abuse: Denies Rehab hx: Denies  Exam Findings  Physical Exam: Reviewed and agree with the physical exam  findings conducted by the medical provider Vital Signs:  Temp:  [98.2 F (36.8 C)-98.5 F (36.9 C)] 98.5 F (36.9 C) (01/07 1027) Pulse Rate:  [58-112] 112 (01/07 1027) Resp:  [16-18] 18 (01/07 1027) BP: (98-122)/(60-75) 122/75 (01/07 1027) SpO2:  [96 %-100 %] 98 % (01/07 1027) Weight:  [135 kg] 135 kg (01/06 1630) Blood pressure 122/75, pulse (!) 112, temperature 98.5 F (36.9 C), resp. rate 18, height 5' 4 (1.626 m), weight 135 kg, SpO2 98%. Body mass index is 51.09 kg/m.    Mental Status Exam: General Appearance: Fairly Groomed  Orientation:  Full (Time, Place, and Person)  Memory:  Fair  Concentration:  Fair  Recall:  Fair  Attention  Fair  Eye Contact:  Fair  Speech:  Slow  Language:  Fair  Volume:  Decreased  Mood: Okay  Affect:  Appropriate  Thought Process:  Coherent  Thought Content:  Logical  Suicidal Thoughts:  Denied  Homicidal Thoughts:  No  Judgement:  Poor  Insight:  Lacking  Psychomotor Activity:  WNL  Akathisia:  No  Fund of Knowledge:  Fair       Assets:  Therapist, Sports  Cognition:  Impaired,  Moderate  ADL's: WDL  AIMS (if indicated):        Other History   These have been pulled in through the EMR, reviewed, and updated if appropriate.  Family History:  The patient's family history is not on file.  Medical History: Past Medical History:  Diagnosis Date   Borderline personality disorder (HCC)    Constipation 05/15/2015   Depression    DMDD (disruptive mood dysregulation disorder) 05/15/2015   History of seasonal allergies 05/15/2015   Hx of gastroesophageal reflux (GERD) 05/15/2015   Hx of seizure disorder 05/15/2015   Intellectual disability 05/22/2015   PTSD (post-traumatic stress disorder)    Seizures (HCC)    last one in  2015   Suicidal ideation     Surgical History: History reviewed. No pertinent surgical history.   Medications:  Current  Medications[1]  Allergies: Allergies[2]  Zelda Sharps, NP This note was created using Dragon dictation software. Please excuse any inadvertent transcription errors. Case was discussed with supervising physician Dr. Jadapalle who is agreeable with current plan.       [1]  Current Facility-Administered Medications:    apixaban  (ELIQUIS ) tablet 5 mg, 5 mg, Oral, BID, Quale, Mark, MD, 5 mg at 01/17/24 1021   Deutetrabenazine  ER TB24 1 tablet, 1 tablet, Oral, QHS, Quale, Mark, MD   docusate sodium  (COLACE) capsule 100 mg, 100 mg, Oral, BID, Quale, Mark, MD, 100 mg at 01/17/24 1022   escitalopram  (LEXAPRO ) tablet 10 mg, 10 mg, Oral, Daily, Quale, Mark, MD, 10 mg at 01/17/24 1021   levothyroxine  (SYNTHROID ) tablet 50 mcg, 50 mcg, Oral, QAC breakfast, Quale, Mark, MD, 50 mcg at 01/17/24 1021   LORazepam  (ATIVAN ) tablet 0.5 mg, 0.5 mg, Oral, QHS, Quale, Oneil, MD   metoprolol  tartrate (LOPRESSOR ) tablet 50 mg, 50 mg, Oral, Daily, Quale, Mark, MD, 50 mg at 01/17/24 1022   midodrine  (PROAMATINE ) tablet 5 mg, 5 mg, Oral, TID WC, Quale, Mark, MD, 5 mg at 01/17/24 1022   pantoprazole  (PROTONIX ) EC tablet 40 mg, 40 mg, Oral, Daily, Quale, Mark, MD, 40 mg at 01/17/24 1022   QUEtiapine  (SEROQUEL ) tablet 12.5 mg, 12.5 mg, Oral, QHS, Quale, Mark, MD   QUEtiapine  (SEROQUEL ) tablet 50 mg, 50 mg, Oral, Daily, Quale, Mark, MD, 50 mg at 01/17/24 1021  Current Outpatient Medications:    apixaban  (ELIQUIS ) 5 MG TABS tablet, Take 5 mg by mouth 2 (two) times daily., Disp: , Rfl:    benztropine  (COGENTIN ) 1 MG tablet, Take 1 mg by mouth 2 (two) times daily., Disp: , Rfl:    cloNIDine  (CATAPRES ) 0.1 MG tablet, Take 0.1 mg by mouth 2 (two) times daily., Disp: , Rfl:    Deutetrabenazine  ER (AUSTEDO  XR) 24 MG TB24, Take 1 tablet by mouth at bedtime., Disp: , Rfl:    divalproex  (DEPAKOTE ) 500 MG DR tablet, Take 1,000 mg by mouth 2 (two) times daily., Disp: , Rfl:    divalproex  (DEPAKOTE ) 500 MG DR tablet, Take 1 tablet  (500 mg total) by mouth 2 (two) times daily., Disp: 60 tablet, Rfl: 0   docusate sodium  (COLACE) 100 MG capsule, Take 100 mg by mouth 2 (two) times daily., Disp: , Rfl:    escitalopram  (LEXAPRO ) 10 MG tablet, Take 10 mg by mouth daily., Disp: , Rfl:    ferrous sulfate  325 (65 FE) MG tablet, Take 1 tablet (325 mg total) by mouth every other day., Disp: 30 tablet, Rfl: 0   fluticasone  (FLONASE ) 50 MCG/ACT nasal spray, Place 1 spray into both nostrils daily., Disp: , Rfl:    folic acid  (FOLVITE ) 1 MG tablet, Take 1 mg by mouth daily., Disp: , Rfl:    hydrochlorothiazide  (HYDRODIURIL ) 25 MG tablet, Take 25 mg by mouth daily., Disp: , Rfl:    levETIRAcetam  (KEPPRA ) 500 MG tablet, Take 500 mg by mouth 2 (two) times daily., Disp: , Rfl:    levothyroxine  (SYNTHROID ) 50 MCG tablet, Take 50 mcg by mouth daily before breakfast., Disp: , Rfl:    loperamide  (IMODIUM  A-D) 2 MG tablet, Take 1 tablet (2 mg total) by mouth 4 (four) times daily as needed for diarrhea or loose stools., Disp: 30 tablet, Rfl: 0   LORazepam  (ATIVAN ) 1 MG tablet, Take 0.5  tablets (0.5 mg total) by mouth at bedtime. (Patient taking differently: Take 1 mg by mouth in the morning, at noon, and at bedtime.), Disp: , Rfl:    meclizine  (ANTIVERT ) 25 MG tablet, Take 25 mg by mouth 3 (three) times daily as needed for nausea., Disp: , Rfl:    metoprolol  tartrate (LOPRESSOR ) 50 MG tablet, Take 50 mg by mouth daily., Disp: , Rfl:    midodrine  (PROAMATINE ) 5 MG tablet, Take 1 tablet (5 mg total) by mouth 3 (three) times daily with meals., Disp: 90 tablet, Rfl: 0   Multiple Vitamins-Minerals (MULTIVITAMIN ADULTS) TABS, Take 1 tablet by mouth daily., Disp: , Rfl:    omeprazole (PRILOSEC) 40 MG capsule, Take 40 mg by mouth daily., Disp: , Rfl:    QUEtiapine  (SEROQUEL ) 25 MG tablet, Take 0.5 tablets (12.5 mg total) by mouth at bedtime., Disp: 30 tablet, Rfl: 0   QUEtiapine  (SEROQUEL ) 50 MG tablet, Take 50 mg by mouth daily., Disp: , Rfl:    trazodone   (DESYREL ) 300 MG tablet, Take 300 mg by mouth at bedtime. (Patient not taking: Reported on 01/10/2024), Disp: , Rfl:    trazodone  (DESYREL ) 50 MG tablet, Take 1.5 tablets (75 mg total) by mouth at bedtime., Disp: 30 tablet, Rfl: 0   UZEDY  100 MG/0.28ML syringe, Inject 100 mg into the skin every 30 (thirty) days., Disp: , Rfl:    VENTOLIN  HFA 108 (90 Base) MCG/ACT inhaler, Inhale 2 puffs into the lungs every 6 (six) hours as needed for wheezing or shortness of breath., Disp: , Rfl:    Xanomeline-Trospium  Chloride 125-30 MG CAPS, Take 1 capsule by mouth 2 (two) times daily., Disp: , Rfl:  [2]  Allergies Allergen Reactions   Ritalin [Methylphenidate Hcl] Other (See Comments)    seizures   Abilify [Aripiprazole] Other (See Comments)    Shaking or tremors   Lithium  Other (See Comments)    Obtunded state.

## 2024-01-17 NOTE — ED Notes (Addendum)
 UPDATE: [employer] contacted and report they are still actively in search of pt.

## 2024-01-17 NOTE — ED Notes (Addendum)
 Pt visualized walking out of the building, attempted to redirect pt back into the building pt would not listen to this RN.

## 2024-01-17 NOTE — ED Notes (Signed)
 Pt given snack.

## 2024-01-17 NOTE — ED Notes (Signed)
 Patient is resting comfortably.

## 2024-01-17 NOTE — ED Provider Notes (Signed)
 "  [facility] Provider Note    Event Date/Time   First MD Initiated Contact with Patient 01/17/24 1749     (approximate)   History   Psychiatric Evaluation   HPI  Sherry Bryan is a 29 y.o. female with a history of intellectual disability, MDD, borderline personality, and PTSD who presents with problems with her group home.  When I initially asked the patient why she was here, the patient states that she ran away from her group home.  I asked if after her discharge just a couple of hours ago she even entered the group home before running away, and she said no.  She states that she does not feel safe there.  She said that staff put her at some point although this was several months ago.  She said that this was the primary issue that brought her to the ED.  She states that she has a guardian who is working on helping her get into a different group home.   When I asked her, especially given her psychiatric evaluation and discharge just a couple of hours ago, what her expectations were of her repeat ED visit so quickly, she stated she hoped that someone would finally take her seriously and only at that time mentioned SI.  She has no specific plan.  She denies any change in her SI since she was evaluated earlier today.  I reviewed the past medical records.  The patient has numerous prior ED visits and psychiatric evaluations for SI.  This was her third visit since 1/5.  From the prior notes,  it appears that the underlying issue is that she is unhappy at her group home.  I reviewed the note from NP Claudene from psychiatry who evaluated the patient today and signed the note at 1516.  She notes as follows, Pattern of emergency department utilization for non-psychiatric reasons: Patient's repeated emergency department presentations within a 24-hour period, with discharge followed by return 2 hours later with the same complaints, suggests she is using emergency services to  express dissatisfaction with her living situation rather than for genuine psychiatric emergencies. This pattern of behavior does not meet criteria for inpatient psychiatric admission, which is reserved for patients who cannot be safely managed in less restrictive settings due to acute psychiatric illness or imminent danger to self or others.  The psychiatry assessment was that inpatient psychiatric admission would not address the patient's underlying issue.  The patient has appropriate supports in place including her guardian.  The legal guardian was contacted by psychiatry and agreed with discharge.     Physical Exam   Triage Vital Signs: ED Triage Vitals  Encounter Vitals Group     BP 01/17/24 1745 (!) 99/50     Girls Systolic BP Percentile --      Girls Diastolic BP Percentile --      Boys Systolic BP Percentile --      Boys Diastolic BP Percentile --      Pulse Rate 01/17/24 1745 100     Resp 01/17/24 1745 18     Temp 01/17/24 1745 98.6 F (37 C)     Temp Source 01/17/24 1745 Oral     SpO2 01/17/24 1745 98 %     Weight 01/17/24 1743 (!) 350 lb (158.8 kg)     Height 01/17/24 1743 5' 4 (1.626 m)     Head Circumference --      Peak Flow --      Pain  Score 01/17/24 1743 6     Pain Loc --      Pain Education --      Exclude from Growth Chart --     Most recent vital signs: Vitals:   01/17/24 1745  BP: (!) 99/50  Pulse: 100  Resp: 18  Temp: 98.6 F (37 C)  SpO2: 98%     General: Alert, comfortable appearing, no distress.  CV:  Good peripheral perfusion.  Resp:  Normal effort.  Abd:  No distention.  Other:  Calm and cooperative.   ED Results / Procedures / Treatments   Labs (all labs ordered are listed, but only abnormal results are displayed) Labs Reviewed - No data to display   EKG     RADIOLOGY    PROCEDURES:  Critical Care performed: No  Procedures   MEDICATIONS ORDERED IN ED: Medications - No data to display   IMPRESSION / MDM /  ASSESSMENT AND PLAN / ED COURSE  I reviewed the triage vital signs and the nursing notes.  29 year old female with PMH as noted above and numerous prior ED visits presents with recurrent desire to leave her group home and chronic, unchanged SI.  On exam the patient was comfortable appearing.  Her vital signs are normal.  Differential diagnosis includes, but is not limited to, housing to satisfaction, intellectual disability, chronic SI, malingering.  Patient's presentation is most consistent with exacerbation of chronic illness.  Based on my current assessment and the patient's evaluation for the same symptoms from just a couple of hours ago, there is no evidence of an emergency medical or psychiatric condition.  There is no evidence of danger to self or others.  The patient was determined to be at low risk at self-harm by psychiatry this afternoon.  She has no new symptoms that have developed since that time.  Therefore, at this time, there is no indication for further ED workup, psychiatry consultation, or admission.  I feel that keeping the patient in the ED for a repeat psychiatric evaluation would be counterproductive and potentially exacerbate her utilization of the ED.  There has also been no evidence of any actual safety issues at the group home.  I explained to the patient that based on her chronic symptoms and the assessment by psychiatry from just a couple of hours ago, that there is no indication for repeat psychiatry evaluation or for further workup in the ED.  I gave the patient strict return precautions.  The patient expresses understanding and agreement.      FINAL CLINICAL IMPRESSION(S) / ED DIAGNOSES   Final diagnoses:  Distressed about housing issues     Rx / DC Orders   ED Discharge Orders     None        Note:  This document was prepared using Dragon voice recognition software and may include unintentional dictation errors.     Jacolyn Pae,  MD 01/17/24 1816  "

## 2024-01-17 NOTE — BH Assessment (Signed)
 Writer spoke to group home owner, Garnetta [phone]. Discussed patient's consecutive ED admissions. Ileene reports she and her staff are aware and have made several attempts to speak with patient about her frequent visits to the ED. She stated she knows that patient does not like her current location and she plans to meet with Darryle, manager for an alternative solution. Informed that patient is psych cleared and will need to be picked up. Ileene said they will come get patient but will need to find out from [facility] what time.   Called [facility] [phone] and left message on voicemail to return call.

## 2024-01-17 NOTE — ED Notes (Signed)
 Meal provided

## 2024-01-17 NOTE — ED Provider Notes (Signed)
----------------------------------------- °  3:17 PM on 01/17/2024 -----------------------------------------  I consulted and discussed the case with NP Claudene from psychiatry who has evaluated the patient and cleared her for discharge.  The patient is stable for discharge at this time.  We are awaiting the group home to come pick her up.  Return precautions have been provided.   Jacolyn Pae, MD 01/17/24 1517

## 2024-01-17 NOTE — ED Notes (Signed)
 Spoke with Garnetta the group home owner of [facility] ([address] [place], [place]) explained the situation and that [employer] had been called to locate patient at this time.

## 2024-01-17 NOTE — ED Notes (Signed)
 Group home staff en route to pick up pt to return home. Belongings returned to pt and changed into personal clothes. Reiterated importance of not returning to ED for complaints about group home with consequences of legal action being taken. Legal guardian [name] [phone] called to inform that pt is returning to group home. No answer, voicemail left at this time.

## 2024-01-17 NOTE — ED Triage Notes (Addendum)
 Pt brought in via ems.  Pt states she ran away from the group home and called 911.  Pt states staff is being rude to her and pt doesn't want to stay at the group home.  Pt calm and cooperative.   Pt was discharged from [facility] earlier today for similar sx.

## 2024-01-18 ENCOUNTER — Telehealth: Payer: Self-pay | Admitting: Medical Oncology

## 2024-01-18 NOTE — Telephone Encounter (Signed)
 Followed up with Group [facility] - [facility], spoke with Ileene pt has been located and is safe back at the group home.

## 2024-02-16 ENCOUNTER — Other Ambulatory Visit: Payer: Self-pay

## 2024-02-16 ENCOUNTER — Emergency Department: Admission: EM | Admit: 2024-02-16 | Payer: MEDICAID | Source: Home / Self Care

## 2024-02-16 ENCOUNTER — Emergency Department: Payer: MEDICAID

## 2024-02-16 LAB — CBC WITH DIFFERENTIAL/PLATELET
Abs Immature Granulocytes: 0.02 10*3/uL (ref 0.00–0.07)
Basophils Absolute: 0 10*3/uL (ref 0.0–0.1)
Basophils Relative: 0 %
Eosinophils Absolute: 0 10*3/uL (ref 0.0–0.5)
Eosinophils Relative: 0 %
HCT: 44 % (ref 36.0–46.0)
Hemoglobin: 14.3 g/dL (ref 12.0–15.0)
Immature Granulocytes: 0 %
Lymphocytes Relative: 23 %
Lymphs Abs: 2 10*3/uL (ref 0.7–4.0)
MCH: 29.1 pg (ref 26.0–34.0)
MCHC: 32.5 g/dL (ref 30.0–36.0)
MCV: 89.4 fL (ref 80.0–100.0)
Monocytes Absolute: 0.6 10*3/uL (ref 0.1–1.0)
Monocytes Relative: 7 %
Neutro Abs: 5.9 10*3/uL (ref 1.7–7.7)
Neutrophils Relative %: 70 %
Platelets: 147 10*3/uL — ABNORMAL LOW (ref 150–400)
RBC: 4.92 MIL/uL (ref 3.87–5.11)
RDW: 15.5 % (ref 11.5–15.5)
WBC: 8.5 10*3/uL (ref 4.0–10.5)
nRBC: 0 % (ref 0.0–0.2)

## 2024-02-16 LAB — BASIC METABOLIC PANEL WITH GFR
Anion gap: 11 (ref 5–15)
BUN: 12 mg/dL (ref 6–20)
CO2: 26 mmol/L (ref 22–32)
Calcium: 9 mg/dL (ref 8.9–10.3)
Chloride: 101 mmol/L (ref 98–111)
Creatinine, Ser: 0.82 mg/dL (ref 0.44–1.00)
GFR, Estimated: >60 mL/min
Glucose, Bld: 170 mg/dL — ABNORMAL HIGH (ref 70–99)
Potassium: 3.7 mmol/L (ref 3.5–5.1)
Sodium: 139 mmol/L (ref 135–145)

## 2024-02-16 NOTE — ED Notes (Signed)
 BLUE top sent down with basics

## 2024-02-16 NOTE — ED Triage Notes (Addendum)
 Pt to ed from Group home for a fall. Pt fell out of bed and hit her head. Denies LOC. States she is on blood thinners. Pt is caox4, in no acute distress.     [name] [phone]
